# Patient Record
Sex: Female | Born: 1935 | Race: White | Hispanic: No | Smoking: Never smoker
Health system: Southern US, Community
[De-identification: ages and names within clinical notes are randomized; demographics above are authoritative.]

## PROBLEM LIST (undated history)

## (undated) DIAGNOSIS — E039 Hypothyroidism, unspecified: Secondary | ICD-10-CM

## (undated) DIAGNOSIS — F0390 Unspecified dementia without behavioral disturbance: Secondary | ICD-10-CM

## (undated) DIAGNOSIS — N2889 Other specified disorders of kidney and ureter: Secondary | ICD-10-CM

## (undated) DIAGNOSIS — E1142 Type 2 diabetes mellitus with diabetic polyneuropathy: Secondary | ICD-10-CM

## (undated) DIAGNOSIS — K5909 Other constipation: Secondary | ICD-10-CM

## (undated) DIAGNOSIS — H547 Unspecified visual loss: Secondary | ICD-10-CM

## (undated) DIAGNOSIS — S42209A Unspecified fracture of upper end of unspecified humerus, initial encounter for closed fracture: Secondary | ICD-10-CM

## (undated) DIAGNOSIS — F0150 Vascular dementia without behavioral disturbance: Secondary | ICD-10-CM

## (undated) DIAGNOSIS — E119 Type 2 diabetes mellitus without complications: Secondary | ICD-10-CM

## (undated) DIAGNOSIS — IMO0001 Reserved for inherently not codable concepts without codable children: Secondary | ICD-10-CM

## (undated) DIAGNOSIS — K219 Gastro-esophageal reflux disease without esophagitis: Secondary | ICD-10-CM

## (undated) DIAGNOSIS — I10 Essential (primary) hypertension: Secondary | ICD-10-CM

## (undated) DIAGNOSIS — E559 Vitamin D deficiency, unspecified: Secondary | ICD-10-CM

## (undated) DIAGNOSIS — M7989 Other specified soft tissue disorders: Secondary | ICD-10-CM

## (undated) DIAGNOSIS — Z794 Long term (current) use of insulin: Secondary | ICD-10-CM

## (undated) DIAGNOSIS — R0609 Other forms of dyspnea: Secondary | ICD-10-CM

## (undated) DIAGNOSIS — H9190 Unspecified hearing loss, unspecified ear: Secondary | ICD-10-CM

## (undated) DIAGNOSIS — M47816 Spondylosis without myelopathy or radiculopathy, lumbar region: Secondary | ICD-10-CM

## (undated) DIAGNOSIS — E785 Hyperlipidemia, unspecified: Secondary | ICD-10-CM

## (undated) DIAGNOSIS — N183 Chronic kidney disease, stage 3 unspecified: Secondary | ICD-10-CM

## (undated) DIAGNOSIS — E66812 Obesity, class 2: Secondary | ICD-10-CM

## (undated) DIAGNOSIS — F32A Depression, unspecified: Secondary | ICD-10-CM

## (undated) DIAGNOSIS — M1990 Unspecified osteoarthritis, unspecified site: Secondary | ICD-10-CM

## (undated) DIAGNOSIS — I8390 Asymptomatic varicose veins of unspecified lower extremity: Secondary | ICD-10-CM

## (undated) DIAGNOSIS — Z973 Presence of spectacles and contact lenses: Secondary | ICD-10-CM

## (undated) DIAGNOSIS — H3530 Unspecified macular degeneration: Secondary | ICD-10-CM

## (undated) DIAGNOSIS — G4733 Obstructive sleep apnea (adult) (pediatric): Secondary | ICD-10-CM

## (undated) DIAGNOSIS — G4730 Sleep apnea, unspecified: Secondary | ICD-10-CM

## (undated) HISTORY — PX: VARICOSE VEIN SURGERY: SHX832

## (undated) HISTORY — DX: Vitamin D deficiency, unspecified: E55.9

## (undated) HISTORY — PX: COLONOSCOPY W/ BIOPSIES AND POLYPECTOMY: SHX1376

## (undated) HISTORY — DX: Hypothyroidism, unspecified: E03.9

## (undated) HISTORY — DX: Other forms of dyspnea: R06.09

## (undated) HISTORY — DX: Long term (current) use of insulin: Z79.4

## (undated) HISTORY — DX: Reserved for inherently not codable concepts without codable children: IMO0001

## (undated) HISTORY — PX: BIOPSY THYROID: PRO38

## (undated) HISTORY — DX: Hyperlipidemia, unspecified: E78.5

## (undated) HISTORY — DX: Obstructive sleep apnea (adult) (pediatric): G47.33

## (undated) HISTORY — DX: Unspecified macular degeneration: H35.30

## (undated) HISTORY — DX: Asymptomatic varicose veins of unspecified lower extremity: I83.90

## (undated) HISTORY — DX: Other specified soft tissue disorders: M79.89

## (undated) HISTORY — DX: Essential (primary) hypertension: I10

## (undated) HISTORY — DX: Unspecified visual loss: H54.7

## (undated) HISTORY — DX: Depression, unspecified: F32.A

## (undated) HISTORY — DX: Type 2 diabetes mellitus without complications: E11.9

## (undated) HISTORY — DX: Chronic kidney disease, stage 3 unspecified: N18.30

## (undated) HISTORY — DX: Obesity, class 2: E66.812

## (undated) HISTORY — DX: Spondylosis without myelopathy or radiculopathy, lumbar region: M47.816

## (undated) HISTORY — PX: CATARACT EXTRACTION, BILATERAL: SHX1313

## (undated) HISTORY — PX: TUBAL LIGATION: SHX77

## (undated) HISTORY — DX: Type 2 diabetes mellitus with diabetic polyneuropathy: E11.42

## (undated) HISTORY — DX: Unspecified fracture of upper end of unspecified humerus, initial encounter for closed fracture: S42.209A

## (undated) HISTORY — DX: Unspecified dementia, unspecified severity, without behavioral disturbance, psychotic disturbance, mood disturbance, and anxiety: F03.90

## (undated) HISTORY — DX: Other constipation: K59.09

## (undated) HISTORY — DX: Other specified disorders of kidney and ureter: N28.89

## (undated) HISTORY — DX: Vascular dementia, unspecified severity, without behavioral disturbance, psychotic disturbance, mood disturbance, and anxiety: F01.50

---

## 1998-04-17 ENCOUNTER — Ambulatory Visit (HOSPITAL_COMMUNITY): Admission: RE | Admit: 1998-04-17 | Discharge: 1998-04-17 | Payer: Self-pay | Admitting: *Deleted

## 1998-09-09 HISTORY — PX: ENDOMETRIAL BIOPSY: SHX622

## 1998-12-08 ENCOUNTER — Ambulatory Visit (HOSPITAL_COMMUNITY): Admission: RE | Admit: 1998-12-08 | Discharge: 1998-12-08 | Payer: Self-pay | Admitting: *Deleted

## 1999-05-21 ENCOUNTER — Other Ambulatory Visit: Admission: RE | Admit: 1999-05-21 | Discharge: 1999-05-21 | Payer: Self-pay | Admitting: Obstetrics & Gynecology

## 1999-05-22 ENCOUNTER — Other Ambulatory Visit: Admission: RE | Admit: 1999-05-22 | Discharge: 1999-05-22 | Payer: Self-pay

## 1999-05-22 ENCOUNTER — Encounter (INDEPENDENT_AMBULATORY_CARE_PROVIDER_SITE_OTHER): Payer: Self-pay | Admitting: Specialist

## 2000-02-08 ENCOUNTER — Ambulatory Visit (HOSPITAL_COMMUNITY): Admission: RE | Admit: 2000-02-08 | Discharge: 2000-02-08 | Payer: Self-pay | Admitting: *Deleted

## 2000-05-06 ENCOUNTER — Ambulatory Visit (HOSPITAL_COMMUNITY): Admission: RE | Admit: 2000-05-06 | Discharge: 2000-05-06 | Payer: Self-pay | Admitting: *Deleted

## 2000-06-20 ENCOUNTER — Ambulatory Visit (HOSPITAL_COMMUNITY): Admission: RE | Admit: 2000-06-20 | Discharge: 2000-06-20 | Payer: Self-pay | Admitting: Gastroenterology

## 2000-06-20 ENCOUNTER — Encounter (INDEPENDENT_AMBULATORY_CARE_PROVIDER_SITE_OTHER): Payer: Self-pay | Admitting: Specialist

## 2000-11-24 ENCOUNTER — Ambulatory Visit (HOSPITAL_COMMUNITY): Admission: RE | Admit: 2000-11-24 | Discharge: 2000-11-24 | Payer: Self-pay | Admitting: Internal Medicine

## 2000-11-24 ENCOUNTER — Encounter: Payer: Self-pay | Admitting: Internal Medicine

## 2000-11-26 ENCOUNTER — Ambulatory Visit (HOSPITAL_COMMUNITY): Admission: RE | Admit: 2000-11-26 | Discharge: 2000-11-26 | Payer: Self-pay | Admitting: Internal Medicine

## 2001-03-06 ENCOUNTER — Encounter: Admission: RE | Admit: 2001-03-06 | Discharge: 2001-06-04 | Payer: Self-pay | Admitting: Internal Medicine

## 2001-05-27 ENCOUNTER — Other Ambulatory Visit: Admission: RE | Admit: 2001-05-27 | Discharge: 2001-05-27 | Payer: Self-pay | Admitting: Internal Medicine

## 2001-06-22 ENCOUNTER — Encounter: Admission: RE | Admit: 2001-06-22 | Discharge: 2001-09-20 | Payer: Self-pay | Admitting: Internal Medicine

## 2001-06-29 ENCOUNTER — Ambulatory Visit (HOSPITAL_COMMUNITY): Admission: RE | Admit: 2001-06-29 | Discharge: 2001-06-29 | Payer: Self-pay | Admitting: Internal Medicine

## 2001-06-29 ENCOUNTER — Encounter: Payer: Self-pay | Admitting: Internal Medicine

## 2001-08-28 ENCOUNTER — Encounter: Admission: RE | Admit: 2001-08-28 | Discharge: 2001-08-28 | Payer: Self-pay | Admitting: Internal Medicine

## 2001-08-28 ENCOUNTER — Encounter: Payer: Self-pay | Admitting: Internal Medicine

## 2001-08-28 ENCOUNTER — Encounter (INDEPENDENT_AMBULATORY_CARE_PROVIDER_SITE_OTHER): Payer: Self-pay | Admitting: Specialist

## 2001-08-31 ENCOUNTER — Encounter: Payer: Self-pay | Admitting: Internal Medicine

## 2001-08-31 ENCOUNTER — Encounter: Admission: RE | Admit: 2001-08-31 | Discharge: 2001-08-31 | Payer: Self-pay | Admitting: Internal Medicine

## 2001-10-13 ENCOUNTER — Encounter: Admission: RE | Admit: 2001-10-13 | Discharge: 2002-01-11 | Payer: Self-pay | Admitting: Internal Medicine

## 2002-03-03 ENCOUNTER — Encounter: Admission: RE | Admit: 2002-03-03 | Discharge: 2002-06-01 | Payer: Self-pay | Admitting: Internal Medicine

## 2002-04-21 ENCOUNTER — Encounter: Payer: Self-pay | Admitting: Internal Medicine

## 2002-04-21 ENCOUNTER — Ambulatory Visit (HOSPITAL_COMMUNITY): Admission: RE | Admit: 2002-04-21 | Discharge: 2002-04-21 | Payer: Self-pay | Admitting: Internal Medicine

## 2002-08-04 ENCOUNTER — Other Ambulatory Visit: Admission: RE | Admit: 2002-08-04 | Discharge: 2002-08-04 | Payer: Self-pay | Admitting: Internal Medicine

## 2002-08-10 ENCOUNTER — Encounter: Payer: Self-pay | Admitting: Internal Medicine

## 2002-08-10 ENCOUNTER — Encounter: Admission: RE | Admit: 2002-08-10 | Discharge: 2002-08-10 | Payer: Self-pay | Admitting: Internal Medicine

## 2002-11-30 ENCOUNTER — Ambulatory Visit (HOSPITAL_COMMUNITY): Admission: RE | Admit: 2002-11-30 | Discharge: 2002-11-30 | Payer: Self-pay | Admitting: Internal Medicine

## 2002-11-30 ENCOUNTER — Encounter: Payer: Self-pay | Admitting: Internal Medicine

## 2003-12-01 ENCOUNTER — Encounter: Admission: RE | Admit: 2003-12-01 | Discharge: 2003-12-01 | Payer: Self-pay | Admitting: Internal Medicine

## 2004-08-15 ENCOUNTER — Other Ambulatory Visit: Admission: RE | Admit: 2004-08-15 | Discharge: 2004-08-15 | Payer: Self-pay | Admitting: Internal Medicine

## 2005-04-04 ENCOUNTER — Encounter: Admission: RE | Admit: 2005-04-04 | Discharge: 2005-04-04 | Payer: Self-pay | Admitting: Internal Medicine

## 2006-06-10 ENCOUNTER — Encounter: Admission: RE | Admit: 2006-06-10 | Discharge: 2006-06-10 | Payer: Self-pay | Admitting: Internal Medicine

## 2006-08-25 ENCOUNTER — Other Ambulatory Visit: Admission: RE | Admit: 2006-08-25 | Discharge: 2006-08-25 | Payer: Self-pay | Admitting: Internal Medicine

## 2007-07-07 ENCOUNTER — Encounter: Admission: RE | Admit: 2007-07-07 | Discharge: 2007-07-07 | Payer: Self-pay | Admitting: Internal Medicine

## 2008-07-15 ENCOUNTER — Encounter: Admission: RE | Admit: 2008-07-15 | Discharge: 2008-07-15 | Payer: Self-pay | Admitting: Internal Medicine

## 2009-09-21 ENCOUNTER — Other Ambulatory Visit: Admission: RE | Admit: 2009-09-21 | Discharge: 2009-09-21 | Payer: Self-pay | Admitting: Internal Medicine

## 2010-12-07 ENCOUNTER — Ambulatory Visit (HOSPITAL_BASED_OUTPATIENT_CLINIC_OR_DEPARTMENT_OTHER): Payer: Medicare Other | Attending: Internal Medicine

## 2010-12-07 DIAGNOSIS — G4733 Obstructive sleep apnea (adult) (pediatric): Secondary | ICD-10-CM | POA: Insufficient documentation

## 2010-12-07 DIAGNOSIS — G4761 Periodic limb movement disorder: Secondary | ICD-10-CM | POA: Insufficient documentation

## 2010-12-08 DIAGNOSIS — G4710 Hypersomnia, unspecified: Secondary | ICD-10-CM

## 2010-12-08 DIAGNOSIS — G4730 Sleep apnea, unspecified: Secondary | ICD-10-CM

## 2011-01-25 NOTE — Op Note (Signed)
[facility]  Patient:    Catherine Wood, Catherine Wood                          MRN: [id number] Proc. Date: 06/20/00 Adm. Date:  [id number] Attending:  Nelda Marseille CC:         Nicky Pugh. Lenon Ahmadi, M.D.   Operative Report  PROCEDURE:  Colonoscopy with biopsy.  INDICATIONS:  History of colon polyps, due for repeat screening.  Consent was signed after risks, benefits, methods and options thoroughly discussed multiple times in the past.  MEDICATIONS USED:  Demerol 80, Versed 8.  DESCRIPTION OF PROCEDURE:  Rectal inspection was performed for external hemorrhoids.  Digital exam was negative.  Video pediatric colonoscope was inserted and fairly easily advanced around the colon to the cecum.  This did require left lower quadrant pressure but no position changes.  The cecum was identified by the appendiceal orifice and the ileocecal valve.  There was some stool adherent to the cecum and in the ascending which required lots of washing and suctioning.  The rest of the prep was adequate.  On insertion, no obvious abnormality was seen.  The scope was then slowly withdrawn.  The cecum, ascending, and transverse was normal.  In the mid descending, possibly a tiny 1- to 2-mm polyp was seen which was cold biopsied x 2.  The scope was further withdrawn.  No other polypoid lesions, masses, or other abnormalities were seen as we slowly withdrew back to the rectum.  Once back in the rectum, the scope was retroflexed, ______ for some internal hemorrhoids.  The scope was straightened.  Anorectal pullthrough confirmed the hemorrhoids.  The scope was reinserted a short way up the sigmoid area with suction scope removed. The patient tolerated the procedure well.  There were no obvious immediate complications.  ENDOSCOPIC DIAGNOSES: 1. Internal/external hemorrhoids. 2. Questionable tiny descending polyps, cold biopsied. 3. Otherwise within normal limits to the cecum.  PLAN:  Yearly  rectals and guaiacs per Dr. Lenon Ahmadi.  Happy to see back p.r.n. Await pathology.  Will probably recheck colon screening in five years. DD:  06/20/00 TD:  06/22/00 Job: [id number]

## 2011-12-27 ENCOUNTER — Other Ambulatory Visit: Payer: Self-pay | Admitting: Internal Medicine

## 2011-12-27 DIAGNOSIS — E0590 Thyrotoxicosis, unspecified without thyrotoxic crisis or storm: Secondary | ICD-10-CM

## 2011-12-27 DIAGNOSIS — Z1231 Encounter for screening mammogram for malignant neoplasm of breast: Secondary | ICD-10-CM

## 2012-01-23 ENCOUNTER — Ambulatory Visit
Admission: RE | Admit: 2012-01-23 | Discharge: 2012-01-23 | Disposition: A | Discharge status: Home / Self Care | Payer: Medicare Other | Source: Ambulatory Visit | Attending: Internal Medicine | Admitting: Internal Medicine

## 2012-01-23 DIAGNOSIS — Z1231 Encounter for screening mammogram for malignant neoplasm of breast: Secondary | ICD-10-CM

## 2012-01-23 DIAGNOSIS — E0590 Thyrotoxicosis, unspecified without thyrotoxic crisis or storm: Secondary | ICD-10-CM

## 2013-01-22 ENCOUNTER — Other Ambulatory Visit: Payer: Self-pay

## 2013-01-22 DIAGNOSIS — Z1231 Encounter for screening mammogram for malignant neoplasm of breast: Secondary | ICD-10-CM

## 2013-02-12 ENCOUNTER — Ambulatory Visit
Admission: RE | Admit: 2013-02-12 | Discharge: 2013-02-12 | Disposition: A | Discharge status: Home / Self Care | Payer: Medicare Other | Source: Ambulatory Visit

## 2013-02-12 DIAGNOSIS — Z1231 Encounter for screening mammogram for malignant neoplasm of breast: Secondary | ICD-10-CM

## 2013-08-03 ENCOUNTER — Other Ambulatory Visit: Payer: Self-pay | Admitting: Emergency Medicine

## 2013-08-03 MED ORDER — METFORMIN HCL 1000 MG PO TABS: 1000.0000 mg | ORAL_TABLET | Freq: Two times a day (BID) | ORAL | Status: DC

## 2013-08-03 MED ORDER — INSULIN LISPRO PROT & LISPRO (75-25 MIX) 100 UNIT/ML ~~LOC~~ SUSP: 25.0000 [IU] | Freq: Two times a day (BID) | SUBCUTANEOUS | Status: DC

## 2013-09-22 ENCOUNTER — Other Ambulatory Visit: Payer: Self-pay | Admitting: Physician Assistant

## 2013-09-22 MED ORDER — FUROSEMIDE 40 MG PO TABS: 40.0000 mg | ORAL_TABLET | Freq: Every day | ORAL | Status: DC

## 2013-09-23 ENCOUNTER — Other Ambulatory Visit: Payer: Self-pay | Admitting: Physician Assistant

## 2013-09-23 ENCOUNTER — Other Ambulatory Visit: Payer: Self-pay | Admitting: Emergency Medicine

## 2013-09-26 ENCOUNTER — Encounter: Payer: Self-pay | Admitting: *Deleted

## 2013-09-26 DIAGNOSIS — H3530 Unspecified macular degeneration: Secondary | ICD-10-CM | POA: Insufficient documentation

## 2013-09-26 DIAGNOSIS — N183 Chronic kidney disease, stage 3 unspecified: Secondary | ICD-10-CM | POA: Insufficient documentation

## 2013-09-26 DIAGNOSIS — E039 Hypothyroidism, unspecified: Secondary | ICD-10-CM | POA: Insufficient documentation

## 2013-09-26 DIAGNOSIS — E1169 Type 2 diabetes mellitus with other specified complication: Secondary | ICD-10-CM | POA: Insufficient documentation

## 2013-09-26 DIAGNOSIS — E785 Hyperlipidemia, unspecified: Secondary | ICD-10-CM

## 2013-09-26 DIAGNOSIS — I10 Essential (primary) hypertension: Secondary | ICD-10-CM | POA: Insufficient documentation

## 2013-09-26 DIAGNOSIS — E1122 Type 2 diabetes mellitus with diabetic chronic kidney disease: Secondary | ICD-10-CM | POA: Insufficient documentation

## 2013-09-28 ENCOUNTER — Ambulatory Visit (INDEPENDENT_AMBULATORY_CARE_PROVIDER_SITE_OTHER): Payer: Medicare Other | Admitting: Emergency Medicine

## 2013-09-28 ENCOUNTER — Encounter: Payer: Self-pay | Admitting: Emergency Medicine

## 2013-09-28 VITALS — BP 142/64 | HR 80 | Temp 98.6°F | Resp 16 | Ht 64.0 in | Wt 205.0 lb

## 2013-09-28 DIAGNOSIS — E669 Obesity, unspecified: Secondary | ICD-10-CM

## 2013-09-28 DIAGNOSIS — I10 Essential (primary) hypertension: Secondary | ICD-10-CM

## 2013-09-28 DIAGNOSIS — R300 Dysuria: Secondary | ICD-10-CM

## 2013-09-28 DIAGNOSIS — E109 Type 1 diabetes mellitus without complications: Secondary | ICD-10-CM

## 2013-09-28 DIAGNOSIS — E782 Mixed hyperlipidemia: Secondary | ICD-10-CM

## 2013-09-28 DIAGNOSIS — E559 Vitamin D deficiency, unspecified: Secondary | ICD-10-CM

## 2013-09-28 LAB — CBC WITH DIFFERENTIAL/PLATELET
BASOS ABS: 0.1 10*3/uL (ref 0.0–0.1)
Basophils Relative: 1 % (ref 0–1)
Eosinophils Absolute: 0.4 10*3/uL (ref 0.0–0.7)
Eosinophils Relative: 5 % (ref 0–5)
HEMATOCRIT: 39.4 % (ref 36.0–46.0)
HEMOGLOBIN: 13 g/dL (ref 12.0–15.0)
LYMPHS ABS: 1.9 10*3/uL (ref 0.7–4.0)
Lymphocytes Relative: 25 % (ref 12–46)
MCH: 29.1 pg (ref 26.0–34.0)
MCHC: 33 g/dL (ref 30.0–36.0)
MCV: 88.1 fL (ref 78.0–100.0)
MONOS PCT: 12 % (ref 3–12)
Monocytes Absolute: 0.9 10*3/uL (ref 0.1–1.0)
NEUTROS PCT: 57 % (ref 43–77)
Neutro Abs: 4.4 10*3/uL (ref 1.7–7.7)
Platelets: 366 10*3/uL (ref 150–400)
RBC: 4.47 MIL/uL (ref 3.87–5.11)
RDW: 13.7 % (ref 11.5–15.5)
WBC: 7.7 10*3/uL (ref 4.0–10.5)

## 2013-09-28 LAB — HEMOGLOBIN A1C
HEMOGLOBIN A1C: 8.9 % — AB (ref <5.7)
Mean Plasma Glucose: 209 mg/dL — ABNORMAL HIGH (ref <117)

## 2013-09-28 MED ORDER — CIPROFLOXACIN HCL 250 MG PO TABS: 250.0000 mg | ORAL_TABLET | Freq: Two times a day (BID) | ORAL | Status: AC

## 2013-09-28 NOTE — Patient Instructions (Signed)
Urinary Tract Infection °A urinary tract infection (UTI) can occur any place along the urinary tract. The tract includes the kidneys, ureters, bladder, and urethra. A type of germ called bacteria often causes a UTI. UTIs are often helped with antibiotic medicine.  °HOME CARE  °· If given, take antibiotics as told by your doctor. Finish them even if you start to feel better. °· Drink enough fluids to keep your pee (urine) clear or pale yellow. °· Avoid tea, drinks with caffeine, and bubbly (carbonated) drinks. °· Pee often. Avoid holding your pee in for a long time. °· Pee before and after having sex (intercourse). °· Wipe from front to back after you poop (bowel movement) if you are a woman. Use each tissue only once. °GET HELP RIGHT AWAY IF:  °· You have back pain. °· You have lower belly (abdominal) pain. °· You have chills. °· You feel sick to your stomach (nauseous). °· You throw up (vomit). °· Your burning or discomfort with peeing does not go away. °· You have a fever. °· Your symptoms are not better in 3 days. °MAKE SURE YOU:  °· Understand these instructions. °· Will watch your condition. °· Will get help right away if you are not doing well or get worse. °Document Released: 02/12/2008 Document Revised: 05/20/2012 Document Reviewed: 03/26/2012 °[employer]® Patient Information ©2014 [employer]. ° °

## 2013-09-28 NOTE — Progress Notes (Signed)
Subjective:    Patient ID: Catherine Wood, female    DOB: [birth year], 80 y.o.   MRN: [id number]  HPI Comments: 80 yo obese female presents for 3 month F/U for HTN, Cholesterol, DM, D. deficient LAST LABS BS 215 T 227 TG 389 L 105 A1C 8.4 D 38 MAG 1.5 She increased Pravastatin to a whole pill at last OV with out SE. She has increased vitamin D to 15000 AD. She is not eating healthy but has decreased portions. She is not exercising. She notes BS was good until Christmas then 200s.   She has been using AZo OTC without relief for decreased urine output, dysuria, frequency x 2 weeks. She denies fever or hematuria.  Hypertension  Hyperlipidemia  Diabetes   Current Outpatient Prescriptions on File Prior to Visit  Medication Sig Dispense Refill  . Cholecalciferol (VITAMIN D3) 5000 UNITS CAPS Take 5,000 Units by mouth 3 (three) times daily.       . Cinnamon 500 MG capsule Take 500 mg by mouth daily.      . fenofibrate 160 MG tablet Take 160 mg by mouth daily.      . ferrous fumarate (HEMOCYTE - 106 MG FE) 325 (106 FE) MG TABS tablet Take 1 tablet by mouth daily.      . furosemide (LASIX) 40 MG tablet Take 1 tablet (40 mg total) by mouth daily.  90 tablet  0  . Insulin Pen Needle (PEN NEEDLES 29GX1/2") 29G X 12MM MISC USE 4 TIMES A DAY  100 each  6  . levothyroxine (SYNTHROID, LEVOTHROID) 100 MCG tablet Take 100 mcg by mouth daily before breakfast.      . losartan (COZAAR) 100 MG tablet Take 100 mg by mouth daily. 1/2 daily = 50 mg      . Magnesium 400 MG CAPS Take by mouth 4 (four) times daily.      . metFORMIN (GLUCOPHAGE) 1000 MG tablet Take 1 tablet (1,000 mg total) by mouth 2 (two) times daily with a meal.  60 tablet  2  . Multiple Vitamins-Minerals (ICAPS PO) Take by mouth daily.      . Omega-3 Fatty Acids (FISH OIL) 1000 MG CAPS Take by mouth daily.      . pravastatin (PRAVACHOL) 40 MG tablet Take 40 mg by mouth daily.        No current facility-administered medications on file prior to  visit.   ALLERGIES Detrol  Past Medical History  Diagnosis Date  . Hypertension   . Hypothyroid   . IDDM (insulin dependent diabetes mellitus)   . Hyperlipidemia   . Macular degeneration        Review of Systems  Genitourinary: Positive for dysuria, urgency, frequency and difficulty urinating.  All other systems reviewed and are negative.   BP 142/64  Pulse 80  Temp(Src) 98.6 F (37 C) (Temporal)  Resp 16  Ht 5\' 4"  (1.626 m)  Wt 205 lb (92.987 kg)  BMI 35.17 kg/m2     Objective:   Physical Exam  Nursing note and vitals reviewed. Constitutional: She is oriented to person, place, and time. She appears well-developed and well-nourished. No distress.  Obese centrally  HENT:  Head: Normocephalic and atraumatic.  Right Ear: External ear normal.  Left Ear: External ear normal.  Nose: Nose normal.  Mouth/Throat: Oropharynx is clear and moist.  Eyes: Conjunctivae and EOM are normal.  Neck: Normal range of motion. Neck supple. No JVD present. No thyromegaly present.  Cardiovascular: Normal rate, regular  rhythm, normal heart sounds and intact distal pulses.   Pulmonary/Chest: Effort normal and breath sounds normal.  Abdominal: Soft. Bowel sounds are normal. She exhibits no distension and no mass. There is tenderness. There is no rebound and no guarding.  suprapubic  Musculoskeletal: Normal range of motion. She exhibits no edema and no tenderness.  Lymphadenopathy:    She has no cervical adenopathy.  Neurological: She is alert and oriented to person, place, and time. No cranial nerve deficit.  Skin: Skin is warm and dry. No rash noted. No erythema. No pallor.  Psychiatric: She has a normal mood and affect. Her behavior is normal. Judgment and thought content normal.          Assessment & Plan:  1.  3 month F/U for Obesity, HTN, Cholesterol,DM, D. Deficient. Needs healthy diet, cardio QD and obtain healthy weight. Check Labs, Check BP if >130/80 call office, Check BS  if >200 call office 2. Dysuria- Check labs, increase H2o, Hygiene discussed. Cipro 250 mg BID AD

## 2013-09-29 LAB — HEPATIC FUNCTION PANEL
ALBUMIN: 4.2 g/dL (ref 3.5–5.2)
ALT: 22 U/L (ref 0–35)
AST: 22 U/L (ref 0–37)
Alkaline Phosphatase: 46 U/L (ref 39–117)
BILIRUBIN TOTAL: 0.3 mg/dL (ref 0.3–1.2)
Bilirubin, Direct: 0.1 mg/dL (ref 0.0–0.3)
Indirect Bilirubin: 0.2 mg/dL (ref 0.0–0.9)
Total Protein: 6.7 g/dL (ref 6.0–8.3)

## 2013-09-29 LAB — URINALYSIS, ROUTINE W REFLEX MICROSCOPIC
BILIRUBIN URINE: NEGATIVE
GLUCOSE, UA: 100 mg/dL — AB
Ketones, ur: NEGATIVE mg/dL
NITRITE: NEGATIVE
PH: 6 (ref 5.0–8.0)
Protein, ur: 30 mg/dL — AB
SPECIFIC GRAVITY, URINE: 1.016 (ref 1.005–1.030)
Urobilinogen, UA: 0.2 mg/dL (ref 0.0–1.0)

## 2013-09-29 LAB — BASIC METABOLIC PANEL WITH GFR
BUN: 21 mg/dL (ref 6–23)
CALCIUM: 9.8 mg/dL (ref 8.4–10.5)
CHLORIDE: 97 meq/L (ref 96–112)
CO2: 32 mEq/L (ref 19–32)
Creat: 0.97 mg/dL (ref 0.50–1.10)
GFR, EST AFRICAN AMERICAN: 63 mL/min
GFR, Est Non African American: 55 mL/min — ABNORMAL LOW
GLUCOSE: 200 mg/dL — AB (ref 70–99)
POTASSIUM: 4.6 meq/L (ref 3.5–5.3)
SODIUM: 137 meq/L (ref 135–145)

## 2013-09-29 LAB — URINALYSIS, MICROSCOPIC ONLY
CASTS: NONE SEEN
CRYSTALS: NONE SEEN
RBC / HPF: NONE SEEN RBC/hpf (ref <3)
Squamous Epithelial / LPF: NONE SEEN

## 2013-09-29 LAB — LIPID PANEL
CHOL/HDL RATIO: 4.2 ratio
CHOLESTEROL: 183 mg/dL (ref 0–200)
HDL: 44 mg/dL (ref >39)
LDL Cholesterol: 77 mg/dL (ref 0–99)
TRIGLYCERIDES: 312 mg/dL — AB (ref <150)
VLDL: 62 mg/dL — ABNORMAL HIGH (ref 0–40)

## 2013-09-29 LAB — MAGNESIUM: MAGNESIUM: 1.5 mg/dL (ref 1.5–2.5)

## 2013-09-29 LAB — INSULIN, FASTING: INSULIN FASTING, SERUM: 142 u[IU]/mL — AB (ref 3–28)

## 2013-09-29 LAB — VITAMIN D 25 HYDROXY (VIT D DEFICIENCY, FRACTURES): Vit D, 25-Hydroxy: 64 ng/mL (ref 30–89)

## 2013-09-30 LAB — URINE CULTURE

## 2013-11-04 ENCOUNTER — Ambulatory Visit: Payer: Self-pay | Admitting: Emergency Medicine

## 2013-11-10 ENCOUNTER — Other Ambulatory Visit: Payer: Self-pay | Admitting: Internal Medicine

## 2013-11-10 ENCOUNTER — Other Ambulatory Visit: Payer: Self-pay | Admitting: Emergency Medicine

## 2013-11-10 MED ORDER — METFORMIN HCL 1000 MG PO TABS: 1000.0000 mg | ORAL_TABLET | Freq: Two times a day (BID) | ORAL | Status: DC

## 2013-11-16 ENCOUNTER — Ambulatory Visit (INDEPENDENT_AMBULATORY_CARE_PROVIDER_SITE_OTHER): Payer: Medicare Other | Admitting: Emergency Medicine

## 2013-11-16 ENCOUNTER — Encounter: Payer: Self-pay | Admitting: Emergency Medicine

## 2013-11-16 VITALS — BP 122/64 | HR 86 | Temp 98.4°F | Resp 18 | Ht 64.0 in | Wt 208.0 lb

## 2013-11-16 DIAGNOSIS — Z794 Long term (current) use of insulin: Secondary | ICD-10-CM

## 2013-11-16 DIAGNOSIS — E1159 Type 2 diabetes mellitus with other circulatory complications: Secondary | ICD-10-CM

## 2013-11-16 DIAGNOSIS — E119 Type 2 diabetes mellitus without complications: Secondary | ICD-10-CM

## 2013-11-16 DIAGNOSIS — M79606 Pain in leg, unspecified: Secondary | ICD-10-CM

## 2013-11-16 DIAGNOSIS — R0602 Shortness of breath: Secondary | ICD-10-CM

## 2013-11-16 DIAGNOSIS — IMO0001 Reserved for inherently not codable concepts without codable children: Secondary | ICD-10-CM

## 2013-11-16 DIAGNOSIS — N390 Urinary tract infection, site not specified: Secondary | ICD-10-CM

## 2013-11-16 LAB — CBC WITH DIFFERENTIAL/PLATELET
BASOS ABS: 0 10*3/uL (ref 0.0–0.1)
BASOS PCT: 0 % (ref 0–1)
EOS ABS: 0.5 10*3/uL (ref 0.0–0.7)
Eosinophils Relative: 5 % (ref 0–5)
HEMATOCRIT: 38.6 % (ref 36.0–46.0)
HEMOGLOBIN: 12.9 g/dL (ref 12.0–15.0)
Lymphocytes Relative: 28 % (ref 12–46)
Lymphs Abs: 2.5 10*3/uL (ref 0.7–4.0)
MCH: 28.2 pg (ref 26.0–34.0)
MCHC: 33.4 g/dL (ref 30.0–36.0)
MCV: 84.5 fL (ref 78.0–100.0)
MONO ABS: 0.8 10*3/uL (ref 0.1–1.0)
Monocytes Relative: 9 % (ref 3–12)
NEUTROS ABS: 5.3 10*3/uL (ref 1.7–7.7)
NEUTROS PCT: 58 % (ref 43–77)
Platelets: 353 10*3/uL (ref 150–400)
RBC: 4.57 MIL/uL (ref 3.87–5.11)
RDW: 13.4 % (ref 11.5–15.5)
WBC: 9.1 10*3/uL (ref 4.0–10.5)

## 2013-11-16 LAB — BASIC METABOLIC PANEL WITH GFR
BUN: 19 mg/dL (ref 6–23)
CO2: 34 meq/L — AB (ref 19–32)
Calcium: 9.5 mg/dL (ref 8.4–10.5)
Chloride: 99 mEq/L (ref 96–112)
Creat: 0.86 mg/dL (ref 0.50–1.10)
GFR, EST NON AFRICAN AMERICAN: 64 mL/min
GFR, Est African American: 73 mL/min
Glucose, Bld: 123 mg/dL — ABNORMAL HIGH (ref 70–99)
Potassium: 4.7 mEq/L (ref 3.5–5.3)
SODIUM: 140 meq/L (ref 135–145)

## 2013-11-16 NOTE — Patient Instructions (Signed)
Diabetes and Foot Care Diabetes may cause you to have problems because of poor blood supply (circulation) to your feet and legs. This may cause the skin on your feet to become thinner, break easier, and heal more slowly. Your skin may become dry, and the skin may peel and crack. You may also have nerve damage in your legs and feet causing decreased feeling in them. You may not notice minor injuries to your feet that could lead to infections or more serious problems. Taking care of your feet is one of the most important things you can do for yourself.  HOME CARE INSTRUCTIONS Wear shoes at all times, even in the house. Do not go barefoot. Bare feet are easily injured. Check your feet daily for blisters, cuts, and redness. If you cannot see the bottom of your feet, use a mirror or ask someone for help. Wash your feet with warm water (do not use hot water) and mild soap. Then pat your feet and the areas between your toes until they are completely dry. Do not soak your feet as this can dry your skin. Apply a moisturizing lotion or petroleum jelly (that does not contain alcohol and is unscented) to the skin on your feet and to dry, brittle toenails. Do not apply lotion between your toes. Trim your toenails straight across. Do not dig under them or around the cuticle. File the edges of your nails with an emery board or nail file. Do not cut corns or calluses or try to remove them with medicine. Wear clean socks or stockings every day. Make sure they are not too tight. Do not wear knee-high stockings since they may decrease blood flow to your legs. Wear shoes that fit properly and have enough cushioning. To break in new shoes, wear them for just a few hours a day. This prevents you from injuring your feet. Always look in your shoes before you put them on to be sure there are no objects inside. Do not cross your legs. This may decrease the blood flow to your feet. If you find a minor scrape, cut, or break in the  skin on your feet, keep it and the skin around it clean and dry. These areas may be cleansed with mild soap and water. Do not cleanse the area with peroxide, alcohol, or iodine. When you remove an adhesive bandage, be sure not to damage the skin around it. If you have a wound, look at it several times a day to make sure it is healing. Do not use heating pads or hot water bottles. They may burn your skin. If you have lost feeling in your feet or legs, you may not know it is happening until it is too late. Make sure your health care provider performs a complete foot exam at least annually or more often if you have foot problems. Report any cuts, sores, or bruises to your health care provider immediately. SEEK MEDICAL CARE IF:  You have an injury that is not healing. You have cuts or breaks in the skin. You have an ingrown nail. You notice redness on your legs or feet. You feel burning or tingling in your legs or feet. You have pain or cramps in your legs and feet. Your legs or feet are numb. Your feet always feel cold. SEEK IMMEDIATE MEDICAL CARE IF:  There is increasing redness, swelling, or pain in or around a wound. There is a red line that goes up your leg. Pus is coming from a  wound. You develop a fever or as directed by your health care provider. You notice a bad smell coming from an ulcer or wound. Document Released: 08/23/2000 Document Revised: 04/28/2013 Document Reviewed: 02/02/2013 [facility] Patient Information 2014 [place], [place]. Heart Failure Heart failure means your heart has trouble pumping blood. This makes it hard for your body to work well. Heart failure is usually a long-term (chronic) condition. You must take good care of yourself and follow your doctor's treatment plan. HOME CARE  Take your heart medicine as told by your doctor.  Do not stop taking medicine unless your doctor tells you to.  Do not skip any dose of medicine.  Refill your medicines before they run  out.  Take other medicines only as told by your doctor or pharmacist.  Stay active if told by your doctor. The elderly and people with severe heart failure should talk with a doctor about physical activity.  Eat heart healthy foods. Choose foods that are without trans fat and are low in saturated fat, cholesterol, and salt (sodium). This includes fresh or frozen fruits and vegetables, fish, lean meats, fat-free or low-fat dairy foods, whole grains, and high-fiber foods. Lentils and dried peas and beans (legumes) are also good choices.  Limit salt if told by your doctor.  Cook in a healthy way. Roast, grill, broil, bake, poach, steam, or stir-fry foods.  Limit fluids as told by your doctor.  Weigh yourself every morning. Do this after you pee (urinate) and before you eat breakfast. Write down your weight to give to your doctor.  Take your blood pressure and write it down if your doctor tell you to.  Ask your doctor how to check your pulse. Check your pulse as told.  Lose weight if told by your doctor.  Stop smoking or chewing tobacco. Do not use gum or patches that help you quit without your doctor's approval.  Schedule and go to doctor visits as told.  Nonpregnant women should have no more than 1 drink a day. Men should have no more than 2 drinks a day. Talk to your doctor about drinking alcohol.  Stop illegal drug use.  Stay current with shots (immunizations).  Manage your health conditions as told by your doctor.  Learn to manage your stress.  Rest when you are tired.  If it is really hot outside:  Avoid intense activities.  Use air conditioning or fans, or get in a cooler place.  Avoid caffeine and alcohol.  Wear loose-fitting, lightweight, and light-colored clothing.  If it is really cold outside:  Avoid intense activities.  Layer your clothing.  Wear mittens or gloves, a hat, and a scarf when going outside.  Avoid alcohol.  Learn about heart failure and  get support as needed.  Get help to maintain or improve your quality of life and your ability to care for yourself as needed. GET HELP IF:   You gain 03 lb/1.4 kg or more in 1 day or 05 lb/2.3 kg in a week.  You are more short of breath than usual.  You cannot do your normal activities.  You tire easily.  You cough more than normal, especially with activity.  You have any or more puffiness (swelling) in areas such as your hands, feet, ankles, or belly (abdomen).  You cannot sleep because it is hard to breathe.  You feel like your heart is beating fast (palpitations).  You get dizzy or lightheaded when you stand up. GET HELP RIGHT AWAY IF:  You have trouble breathing.  There is a change in mental status, such as becoming less alert or not being able to focus.  You have chest pain or discomfort.  You faint. MAKE SURE YOU:   Understand these instructions.  Will watch your condition.  Will get help right away if you are not doing well or get worse. Document Released: 06/04/2008 Document Revised: 12/21/2012 Document Reviewed: 03/26/2012 [facility] Patient Information 2014 [place], [place]. We want weight loss that will last so you should lose 1-2 pounds a week.  THAT IS IT! Please pick THREE things a month to change. Once it is a habit check off the item. Then pick another three items off the list to become habits.  If you are already doing a habit on the list GREAT!  Cross that item off! o Don't drink your calories. Ie, alcohol, soda, fruit juice, and sweet tea.  o Drink more water. Drink a glass when you feel hungry or before each meal.  o Eat breakfast - Complex carb and protein (likeDannon light and fit yogurt, oatmeal, fruit, eggs, [place] bacon). o Measure your cereal.  Eat no more than one cup a day. (ie [place]) o Eat an apple a day. o Add a vegetable a day. o Try a new vegetable a month. o Use Pam! Stop using oil or butter to cook. o Don't finish your plate or use  smaller plates. o Share your dessert. o Eat sugar free Jello for dessert or frozen grapes. o Don't eat 2-3 hours before bed. o Switch to whole wheat bread, pasta, and brown rice. o Make healthier choices when you eat out. No fries! o Pick baked chicken, NOT fried. o Don't forget to SLOW DOWN when you eat. It is not going anywhere.  o Take the stairs. o Park far away in the parking lot o [employer] (or weights) for 10 minutes while watching TV. o Walk at work for 10 minutes during break. o Walk outside 1 time a week with your friend, kids, dog, or significant other. o Start a walking group at [place] the mall as much as you can tolerate.  o Keep a food diary. o Weigh yourself daily. o Walk for 15 minutes 3 days per week. o Cook at home more often and eat out less.  If life happens and you go back to old habits, it is okay.  Just start over. You can do it!   If you experience chest pain, get short of breath, or tired during the exercise, please stop immediately and inform your doctor.    Bad carbs also include fruit juice, alcohol, and sweet tea. These are empty calories that do not signal to your brain that you are full.   Please remember the good carbs are still carbs which convert into sugar. So please measure them out no more than 1/2-1 cup of rice, oatmeal, pasta, and beans.  Veggies are however free foods! Pile them on.   I like lean protein at every meal such as chicken, [place], pork chops, cottage cheese, etc. Just do not fry these meats and please center your meal around vegetable, the meats should be a side dish.   No all fruit is created equal. Please see the list below, the fruit at the bottom is higher in sugars than the fruit at the top

## 2013-11-16 NOTE — Progress Notes (Addendum)
Subjective:    Patient ID: Catherine Wood, female    DOB: [birth year], 80 y.o.   MRN: [id number]  HPI Comments: 80 yo uncontrolled DM presents for f/u labs from recent UTI and new start Invokana. She was treated with Cipro at last OV for uti and notes frequency has improved.   She has started Invokana 300 which has helped with BS despite no dietary changes. She notes BS was 96 this a.m. She is feeling well overall, she has not started to decrease insulin. She checks feet routinely and denies skin break down or neuropathy increase. She notes occasionally numbness in feet.   She is having right ear pain on/ off x a week or 2. She denies any recent colds/ allergies/ TMJ Hx.   She has noted mild edema of both legs and mild SOB on/ off x several months. She denies any trigger and has improved symptoms with leg elevation. She notes difficult to exercise with increase pain in legs with walking.    Current Outpatient Prescriptions on File Prior to Visit  Medication Sig Dispense Refill  . aspirin 81 MG tablet Take 81 mg by mouth daily.      . Calcium Carbonate (CALCIUM 600 PO) Take 600 mg by mouth daily.      . Cholecalciferol (VITAMIN D3) 5000 UNITS CAPS Take 5,000 Units by mouth 3 (three) times daily.       . Cinnamon 500 MG capsule Take 500 mg by mouth daily.      . fenofibrate 160 MG tablet Take 160 mg by mouth daily.      . ferrous fumarate (HEMOCYTE - 106 MG FE) 325 (106 FE) MG TABS tablet Take 1 tablet by mouth daily.      . furosemide (LASIX) 40 MG tablet Take 1 tablet (40 mg total) by mouth daily.  90 tablet  0  . gabapentin (NEURONTIN) 300 MG capsule Take 300 mg by mouth at bedtime.      . insulin glargine (LANTUS) 100 UNIT/ML injection Inject into the skin 2 (two) times daily. 40 units BID      . insulin lispro protamine-lispro (HUMALOG 75/25 MIX) (75-25) 100 UNIT/ML SUSP injection Inject into the skin 2 (two) times daily with a meal. 35 units in the morning and 15 units in the evening       . Insulin Pen Needle (PEN NEEDLES 29GX1/2") 29G X 12MM MISC USE 4 TIMES A DAY  100 each  6  . levothyroxine (SYNTHROID, LEVOTHROID) 100 MCG tablet Take 100 mcg by mouth daily before breakfast.      . losartan (COZAAR) 100 MG tablet Take 100 mg by mouth daily. 1/2 daily = 50 mg      . Magnesium 400 MG CAPS Take by mouth 4 (four) times daily.      . metFORMIN (GLUCOPHAGE) 1000 MG tablet Take 1 tablet (1,000 mg total) by mouth 2 (two) times daily with a meal.  60 tablet  2  . Multiple Vitamins-Minerals (ICAPS PO) Take by mouth daily.      . Omega-3 Fatty Acids (FISH OIL) 1000 MG CAPS Take by mouth daily.      Marland Kitchen OVER THE COUNTER MEDICATION as needed. Macular Degeneration refresh eye drops      . pravastatin (PRAVACHOL) 40 MG tablet Take 40 mg by mouth daily.       . ranitidine (ZANTAC) 300 MG capsule Take 300 mg by mouth every evening.       No current facility-administered  medications on file prior to visit.   Allergies  Allergen Reactions  . Detrol [Tolterodine]    Past Medical History  Diagnosis Date  . Hypertension   . Hypothyroid   . IDDM (insulin dependent diabetes mellitus)   . Hyperlipidemia   . Macular degeneration      Review of Systems  HENT: Positive for ear pain.   Respiratory: Positive for shortness of breath.   Cardiovascular: Positive for leg swelling.  All other systems reviewed and are negative.   BP 122/64  Pulse 86  Temp(Src) 98.4 F (36.9 C) (Temporal)  Resp 18  Ht 5\' 4"  (1.626 m)  Wt 208 lb (94.348 kg)  BMI 35.69 kg/m2     Objective:   Physical Exam  Nursing note and vitals reviewed. Constitutional: She is oriented to person, place, and time. She appears well-developed and well-nourished. No distress.  HENT:  Head: Normocephalic and atraumatic.  Right Ear: External ear normal.  Left Ear: External ear normal.  Nose: Nose normal.  Mouth/Throat: Oropharynx is clear and moist. No oropharyngeal exudate.  Cloudy TM's bilaterally + clicking with TMJ  ROM  Eyes: Conjunctivae and EOM are normal.  Neck: Normal range of motion. Neck supple. No JVD present. No thyromegaly present.  Cardiovascular: Normal rate, regular rhythm, normal heart sounds and intact distal pulses.   2 + pitting edema bil LE EDEMA   Pulmonary/Chest: Effort normal and breath sounds normal.  Abdominal: Soft. Bowel sounds are normal. She exhibits no distension and no mass. There is no tenderness. There is no rebound and no guarding.  Musculoskeletal: Normal range of motion. She exhibits no edema and no tenderness.  Lymphadenopathy:    She has no cervical adenopathy.  Neurological: She is alert and oriented to person, place, and time. No cranial nerve deficit.  Skin: Skin is warm and dry. No rash noted. No erythema. No pallor.  Mild dryness and callus at base of great toes/ heels  Psychiatric: She has a normal mood and affect. Her behavior is normal. Judgment and thought content normal.    DM FOOT EXAM PERFORMED/ Documented      Assessment & Plan:  1. Uncontrolled DM with new start Invokana- SX of 300 mg given #42, recheck labs, continue to improve diet/ weight loss, call with BS list in 2 weeks with advised slow decrease of insulin by 2 units every 2-3 days  2.  ? UTI recent- Recheck labs, push H2O Hygiene explained  3.SOB/ Edema/ ? claudication- check labs, increase activity/ elevate legs, increase h2o, ref vascular  4. Allergic rhinitis- Allegra OTC, increase H2o, allergy hygiene explained vs TMJ, hygiene explained, w/c with results

## 2013-11-17 ENCOUNTER — Other Ambulatory Visit: Payer: Self-pay | Admitting: Internal Medicine

## 2013-11-17 LAB — BRAIN NATRIURETIC PEPTIDE: Brain Natriuretic Peptide: 21.9 pg/mL (ref 0.0–100.0)

## 2013-11-17 LAB — URINALYSIS, ROUTINE W REFLEX MICROSCOPIC
Bilirubin Urine: NEGATIVE
Glucose, UA: 100 mg/dL — AB
Hgb urine dipstick: NEGATIVE
KETONES UR: NEGATIVE mg/dL
Nitrite: POSITIVE — AB
Protein, ur: NEGATIVE mg/dL
Specific Gravity, Urine: 1.019 (ref 1.005–1.030)
UROBILINOGEN UA: 0.2 mg/dL (ref 0.0–1.0)
pH: 7 (ref 5.0–8.0)

## 2013-11-17 LAB — URINALYSIS, MICROSCOPIC ONLY
Casts: NONE SEEN
Crystals: NONE SEEN

## 2013-11-18 LAB — URINE CULTURE

## 2013-11-19 ENCOUNTER — Other Ambulatory Visit: Payer: Self-pay | Admitting: Emergency Medicine

## 2013-11-19 MED ORDER — NITROFURANTOIN MONOHYD MACRO 100 MG PO CAPS: 100.0000 mg | ORAL_CAPSULE | Freq: Two times a day (BID) | ORAL | Status: AC

## 2013-11-21 NOTE — Addendum Note (Signed)
Addended by: Kelby Aline R on: 11/21/2013 11:21 AM   Modules accepted: Orders

## 2013-11-30 ENCOUNTER — Other Ambulatory Visit: Payer: Self-pay | Admitting: *Deleted

## 2013-11-30 DIAGNOSIS — M79609 Pain in unspecified limb: Secondary | ICD-10-CM

## 2013-12-16 ENCOUNTER — Other Ambulatory Visit: Payer: Self-pay | Admitting: *Deleted

## 2013-12-16 DIAGNOSIS — N390 Urinary tract infection, site not specified: Secondary | ICD-10-CM

## 2013-12-16 DIAGNOSIS — E119 Type 2 diabetes mellitus without complications: Secondary | ICD-10-CM

## 2013-12-16 DIAGNOSIS — R0602 Shortness of breath: Secondary | ICD-10-CM

## 2013-12-16 DIAGNOSIS — E1159 Type 2 diabetes mellitus with other circulatory complications: Secondary | ICD-10-CM

## 2013-12-16 DIAGNOSIS — Z794 Long term (current) use of insulin: Secondary | ICD-10-CM

## 2013-12-16 DIAGNOSIS — IMO0001 Reserved for inherently not codable concepts without codable children: Secondary | ICD-10-CM

## 2013-12-16 MED ORDER — CANAGLIFLOZIN 300 MG PO TABS: 300.0000 mg | ORAL_TABLET | Freq: Every day | ORAL | Status: DC

## 2013-12-30 ENCOUNTER — Ambulatory Visit (INDEPENDENT_AMBULATORY_CARE_PROVIDER_SITE_OTHER): Payer: Medicare Other | Admitting: Internal Medicine

## 2013-12-30 ENCOUNTER — Encounter: Payer: Self-pay | Admitting: Vascular Surgery

## 2013-12-30 ENCOUNTER — Encounter: Payer: Self-pay | Admitting: Internal Medicine

## 2013-12-30 VITALS — BP 132/62 | HR 64 | Temp 97.7°F | Resp 18 | Ht 64.5 in | Wt 207.8 lb

## 2013-12-30 DIAGNOSIS — Z1212 Encounter for screening for malignant neoplasm of rectum: Secondary | ICD-10-CM

## 2013-12-30 DIAGNOSIS — Z79899 Other long term (current) drug therapy: Secondary | ICD-10-CM

## 2013-12-30 DIAGNOSIS — E1029 Type 1 diabetes mellitus with other diabetic kidney complication: Secondary | ICD-10-CM

## 2013-12-30 DIAGNOSIS — Z0000 Encounter for general adult medical examination without abnormal findings: Secondary | ICD-10-CM

## 2013-12-30 DIAGNOSIS — I10 Essential (primary) hypertension: Secondary | ICD-10-CM

## 2013-12-30 DIAGNOSIS — Z789 Other specified health status: Secondary | ICD-10-CM

## 2013-12-30 DIAGNOSIS — E785 Hyperlipidemia, unspecified: Secondary | ICD-10-CM

## 2013-12-30 DIAGNOSIS — E559 Vitamin D deficiency, unspecified: Secondary | ICD-10-CM | POA: Insufficient documentation

## 2013-12-30 LAB — CBC WITH DIFFERENTIAL/PLATELET
BASOS ABS: 0 10*3/uL (ref 0.0–0.1)
Basophils Relative: 0 % (ref 0–1)
EOS ABS: 0.5 10*3/uL (ref 0.0–0.7)
EOS PCT: 5 % (ref 0–5)
HCT: 35.5 % — ABNORMAL LOW (ref 36.0–46.0)
Hemoglobin: 11.7 g/dL — ABNORMAL LOW (ref 12.0–15.0)
Lymphocytes Relative: 28 % (ref 12–46)
Lymphs Abs: 2.5 10*3/uL (ref 0.7–4.0)
MCH: 28.2 pg (ref 26.0–34.0)
MCHC: 33 g/dL (ref 30.0–36.0)
MCV: 85.5 fL (ref 78.0–100.0)
MONO ABS: 0.8 10*3/uL (ref 0.1–1.0)
Monocytes Relative: 9 % (ref 3–12)
Neutro Abs: 5.2 10*3/uL (ref 1.7–7.7)
Neutrophils Relative %: 58 % (ref 43–77)
Platelets: 377 10*3/uL (ref 150–400)
RBC: 4.15 MIL/uL (ref 3.87–5.11)
RDW: 14.1 % (ref 11.5–15.5)
WBC: 9 10*3/uL (ref 4.0–10.5)

## 2013-12-30 LAB — HEPATIC FUNCTION PANEL
ALBUMIN: 4.2 g/dL (ref 3.5–5.2)
ALT: 20 U/L (ref 0–35)
AST: 23 U/L (ref 0–37)
Alkaline Phosphatase: 35 U/L — ABNORMAL LOW (ref 39–117)
Bilirubin, Direct: 0.1 mg/dL (ref 0.0–0.3)
Indirect Bilirubin: 0.2 mg/dL (ref 0.2–1.2)
TOTAL PROTEIN: 6.5 g/dL (ref 6.0–8.3)
Total Bilirubin: 0.3 mg/dL (ref 0.2–1.2)

## 2013-12-30 LAB — BASIC METABOLIC PANEL WITH GFR
BUN: 23 mg/dL (ref 6–23)
CO2: 33 mEq/L — ABNORMAL HIGH (ref 19–32)
CREATININE: 0.9 mg/dL (ref 0.50–1.10)
Calcium: 9.4 mg/dL (ref 8.4–10.5)
Chloride: 101 mEq/L (ref 96–112)
GFR, Est African American: 69 mL/min
GFR, Est Non African American: 60 mL/min
Glucose, Bld: 119 mg/dL — ABNORMAL HIGH (ref 70–99)
Potassium: 5.3 mEq/L (ref 3.5–5.3)
Sodium: 139 mEq/L (ref 135–145)

## 2013-12-30 LAB — LIPID PANEL
Cholesterol: 175 mg/dL (ref 0–200)
HDL: 41 mg/dL (ref >39)
LDL CALC: 77 mg/dL (ref 0–99)
TRIGLYCERIDES: 285 mg/dL — AB (ref <150)
Total CHOL/HDL Ratio: 4.3 Ratio
VLDL: 57 mg/dL — ABNORMAL HIGH (ref 0–40)

## 2013-12-30 LAB — TSH: TSH: 1.128 u[IU]/mL (ref 0.350–4.500)

## 2013-12-30 LAB — MAGNESIUM: MAGNESIUM: 2 mg/dL (ref 1.5–2.5)

## 2013-12-30 NOTE — Progress Notes (Signed)
Patient ID: Catherine Wood, female   DOB: 06-12-1931, 80 y.o.   MRN: [id number]   Annual Screening Comprehensive Examination  This very nice 80 y.o. female presents for complete physical.  Patient has been followed for HTN, T1 IDDM, Hyperlipidemia, and Vitamin D Deficiency.    HTN predates since  90 . Patient's BP has been controlled at home. Today's BP: 132/62 mmHg. Patient denies any cardiac symptoms as chest pain, palpitations, shortness of breath, dizziness or ankle swelling.   Patient's hyperlipidemia is controlled with diet and medications. Patient denies myalgias or other medication SE's. Last cholesterol last visit was 183, triglycerides 312, HDL 44 and LDL 77 in Jan 2015 - at goal.    Patient has T1 IDDM predating since 2003 w/Stage 3 CKD(GFR 40 ml/min) predating with last A1c 8.9% in Jan 2015. She is on Metformin and Invokana and in addition is on BID dosing of both Lantus and Humalog Mix and reports CBG's range 125-140 mg%.  Patient denies reactive hypoglycemic symptoms, visual blurring, diabetic polys, but does have burning pains of the ankles and feet.  paresthesias.    Finally, patient has history of Vitamin D Deficiency with last vitamin D 64 in Jan 2015.  Medication Sig  . aspirin 81 MG tablet Take 81 mg by mouth daily.  . Calcium Carbonate (CALCIUM 600 PO) Take 600 mg by mouth daily.  . Canagliflozin (INVOKANA) 300 MG TABS Take 1 tablet (300 mg total) by mouth daily.  . Cholecalciferol (VITAMIN D3) 5000 UNITS CAPS Take 5,000 Units by mouth 3 (three) times daily.   . Cinnamon 500 MG capsule Take 500 mg by mouth daily.  . fenofibrate 160 MG tablet Take 160 mg by mouth daily.  . ferrous fumarate (HEMOCYTE - 106 MG) 325 MG  Take 1 tablet by mouth daily.  . furosemide (LASIX) 40 MG tablet Take 1 tablet (40 mg total) by mouth daily.  . insulin glargine (LANTUS) 100 UNIT/ML injection Inject into the skin 2 (two) times daily. 40 units BID  . Insulin Pen Needle (PEN NEEDLES 29GX1/2") 29G  X 12MM MISC USE 4 TIMES A DAY  . levothyroxine  (LEVOTHROID) 100 MCG tablet Take 100 mcg by mouth daily before breakfast.  . losartan (COZAAR) 100 MG tablet Take 100 mg by mouth daily. 1/2 daily = 50 mg  . Magnesium 400 MG CAPS Take by mouth 4 (four) times daily.  . metFORMIN (GLUCOPHAGE) 1000 MG tablet Take 1 tablet (1,000 mg total) by mouth 2 (two) times daily with a meal.  . Multiple Vitamins-Minerals (ICAPS PO) Take by mouth daily.  . Omega-3 Fatty Acids (FISH OIL) 1000 MG CAPS Take by mouth daily.  Marland Kitchen OVER THE COUNTER MEDICATION as needed. Macular Degeneration refresh eye drops  . pravastatin (PRAVACHOL) 40 MG tablet Take 40 mg by mouth daily.   . ranitidine (ZANTAC) 300 MG capsule Take 300 mg by mouth every evening.   Allergies  Allergen Reactions  . Detrol [Tolterodine]    Past Medical History  Diagnosis Date  . Hypertension   . Hypothyroid   . IDDM (insulin dependent diabetes mellitus)   . Hyperlipidemia   . Macular degeneration    Past Surgical History  Procedure Laterality Date  . Biopsy thyroid      benign thyroid nodule removal  . Varicose vein surgery    . Cataract extraction, bilateral    . Endometrial biopsy  2000   Family History  Problem Relation Age of Onset  . Cancer Mother  colon  . Cancer Father     GI   History  Substance Use Topics  . Smoking status: Never Smoker   . Smokeless tobacco: Not on file  . Alcohol Use: No    ROS Constitutional: Denies fever, chills, weight loss/gain, headaches, insomnia, fatigue, night sweats, and change in appetite. Eyes: Denies redness, blurred vision, diplopia, discharge, itchy, watery eyes.  ENT: Denies discharge, congestion, post nasal drip, epistaxis, sore throat, earache, hearing loss, dental pain, Tinnitus, Vertigo, Sinus pain, snoring.  Cardio: Denies chest pain, palpitations, irregular heartbeat, syncope, dyspnea, diaphoresis, orthopnea, PND, claudication, edema Respiratory: denies cough, dyspnea, DOE,  pleurisy, hoarseness, laryngitis, wheezing.  Gastrointestinal: Denies dysphagia, heartburn, reflux, water brash, pain, cramps, nausea, vomiting, bloating, diarrhea, constipation, hematemesis, melena, hematochezia, jaundice, hemorrhoids Genitourinary: Denies dysuria, frequency, urgency, nocturia, hesitancy, discharge, hematuria, flank pain Breast:Breast lumps, nipple discharge, bleeding.  Musculoskeletal: Denies arthralgia, myalgia, stiffness, Jt. Swelling, limp, and strain/sprain. Does have LBP w/Left sciatica. Skin: Denies puritis, rash, hives, warts, acne, eczema, changing in skin lesion Neuro: No weakness, tremor, incoordination, spasms, paresthesia, pain. Denies falls. Psychiatric: Denies confusion, memory loss, sensory loss. Denies depresion. Endocrine: Denies change in weight, skin, hair change, nocturia, and paresthesia, diabetic polys, visual blurring, hyper / hypo glycemic episodes.  Heme/Lymph: No excessive bleeding, bruising, enlarged lymph nodes.  Physical Exam  BP 132/62  Pulse 64  Temp(Src) 97.7 F (36.5 C) (Temporal)  Resp 18  Ht 5' 4.5" (1.638 m)  Wt 207 lb 12.8 oz (94.257 kg)  BMI 35.13 kg/m2  General Appearance: Well nourished, in no apparent distress. Eyes: PERRLA, EOMs, conjunctiva no swelling or erythema, normal fundi and vessels. Sinuses: No frontal/maxillary tenderness ENT/Mouth: EACs patent / TMs  nl. Nares clear without erythema, swelling, mucoid exudates. Oral hygiene is good. No erythema, swelling, or exudate. Tongue normal, non-obstructing. Tonsils not swollen or erythematous. Hearing normal.  Neck: Supple, thyroid normal. No bruits, nodes or JVD. Respiratory: Respiratory effort normal.  BS equal and clear bilateral without rales, rhonci, wheezing or stridor. Cardio: Heart sounds are normal with regular rate and rhythm and no murmurs, rubs or gallops. Peripheral pulses are normal and equal bilaterally without edema. No aortic or femoral bruits. Chest:  symmetric with normal excursions and percussion. Breasts: Symmetric, without lumps, nipple discharge, retractions, or fibrocystic changes.  Abdomen: Flat, soft, with bowl sounds. Nontender, no guarding, rebound, hernias, masses, or organomegaly.  Lymphatics: Non tender without lymphadenopathy.  Genitourinary:  Musculoskeletal: Full ROM all peripheral extremities, joint stability, 5/5 strength, and normal gait. Skin: Warm and dry without rashes, lesions, cyanosis, clubbing or  ecchymosis.  Neuro: Cranial nerves intact, reflexes equal bilaterally. Normal muscle tone, no cerebellar symptoms. Sensation intact.  Pysch: Awake and oriented X 3, normal affect, Insight and Judgment appropriate.   Assessment and Plan  1. Annual Screening Examination 2. Hypertension  3. Hyperlipidemia 4. T1 IDDM w/Stage 3 CKD 5. Vitamin D Deficiency 6. Hypothyroidism  Continue prudent diet as discussed, weight control, BP monitoring, regular exercise, and medications. Discussed med's effects and SE's. Screening labs and tests as requested with regular follow-up as recommended.

## 2013-12-31 ENCOUNTER — Ambulatory Visit (HOSPITAL_COMMUNITY)
Admission: RE | Admit: 2013-12-31 | Discharge: 2013-12-31 | Disposition: A | Discharge status: Home / Self Care | Payer: Medicare Other | Source: Ambulatory Visit | Attending: Vascular Surgery | Admitting: Vascular Surgery

## 2013-12-31 ENCOUNTER — Encounter: Payer: Self-pay | Admitting: Vascular Surgery

## 2013-12-31 ENCOUNTER — Other Ambulatory Visit: Payer: Self-pay | Admitting: *Deleted

## 2013-12-31 ENCOUNTER — Ambulatory Visit (INDEPENDENT_AMBULATORY_CARE_PROVIDER_SITE_OTHER): Payer: Medicare Other | Admitting: Vascular Surgery

## 2013-12-31 VITALS — BP 129/67 | HR 80 | Resp 18 | Ht 66.0 in | Wt 205.2 lb

## 2013-12-31 DIAGNOSIS — M79609 Pain in unspecified limb: Secondary | ICD-10-CM | POA: Insufficient documentation

## 2013-12-31 DIAGNOSIS — N390 Urinary tract infection, site not specified: Secondary | ICD-10-CM

## 2013-12-31 DIAGNOSIS — E119 Type 2 diabetes mellitus without complications: Secondary | ICD-10-CM

## 2013-12-31 DIAGNOSIS — I83893 Varicose veins of bilateral lower extremities with other complications: Secondary | ICD-10-CM

## 2013-12-31 DIAGNOSIS — Z794 Long term (current) use of insulin: Secondary | ICD-10-CM

## 2013-12-31 DIAGNOSIS — E1159 Type 2 diabetes mellitus with other circulatory complications: Secondary | ICD-10-CM

## 2013-12-31 DIAGNOSIS — R0602 Shortness of breath: Secondary | ICD-10-CM

## 2013-12-31 DIAGNOSIS — I872 Venous insufficiency (chronic) (peripheral): Secondary | ICD-10-CM

## 2013-12-31 DIAGNOSIS — IMO0001 Reserved for inherently not codable concepts without codable children: Secondary | ICD-10-CM

## 2013-12-31 LAB — URINALYSIS, MICROSCOPIC ONLY
BACTERIA UA: NONE SEEN
CASTS: NONE SEEN
Crystals: NONE SEEN
Squamous Epithelial / LPF: NONE SEEN

## 2013-12-31 LAB — MICROALBUMIN / CREATININE URINE RATIO
Creatinine, Urine: 45 mg/dL
MICROALB UR: 0.75 mg/dL (ref 0.00–1.89)
Microalb Creat Ratio: 16.7 mg/g (ref 0.0–30.0)

## 2013-12-31 LAB — VITAMIN D 25 HYDROXY (VIT D DEFICIENCY, FRACTURES): VIT D 25 HYDROXY: 62 ng/mL (ref 30–89)

## 2013-12-31 LAB — HEMOGLOBIN A1C
Hgb A1c MFr Bld: 8.1 % — ABNORMAL HIGH (ref <5.7)
MEAN PLASMA GLUCOSE: 186 mg/dL — AB (ref <117)

## 2013-12-31 MED ORDER — CANAGLIFLOZIN 300 MG PO TABS: 300.0000 mg | ORAL_TABLET | Freq: Every day | ORAL | Status: DC

## 2013-12-31 NOTE — Progress Notes (Signed)
Referred by:  [name], MD [address] [place], [place] [address]  Reason for referral: B claudication  History of Present Illness  Catherine Wood is a 80 y.o. (05/25/1931) female who presents with chief complaint: bilateral leg swelling and pain.  Onset of symptom occurred years ago but has worsened over the last few months.  This patient complains of swelling as the day goes along associated with burning pain in both calves.  She has had prior vein stripping in the 1970s..The patient denies any intermittent claudication or rest pain.  The patient has a known family history of vein disorders, history of DVT previous requiring anticoagulation and history of pregnancy.  She has used compression stocking previously but not recently.  Atherosclerotic risk factors include: HTN, IDDM.  Past Medical History  Diagnosis Date  . Hypertension   . Hypothyroid   . IDDM (insulin dependent diabetes mellitus)   . Hyperlipidemia   . Macular degeneration   . Varicose veins   . Leg swelling     Past Surgical History  Procedure Laterality Date  . Biopsy thyroid      benign thyroid nodule removal  . Varicose vein surgery    . Cataract extraction, bilateral    . Endometrial biopsy  2000    History   Social History  . Marital Status: Single    Spouse Name: N/A    Number of Children: N/A  . Years of Education: N/A   Occupational History  . Not on file.   Social History Main Topics  . Smoking status: Never Smoker   . Smokeless tobacco: Not on file  . Alcohol Use: No  . Drug Use: Not on file  . Sexual Activity: Not on file   Other Topics Concern  . Not on file   Social History Narrative  . No narrative on file    Family History  Problem Relation Age of Onset  . Cancer Mother     colon  . Cancer Father     GI    Current Outpatient Prescriptions on File Prior to Visit  Medication Sig Dispense Refill  . aspirin 81 MG tablet Take 81 mg by mouth daily.        . Calcium Carbonate (CALCIUM 600 PO) Take 600 mg by mouth daily.      . Cholecalciferol (VITAMIN D3) 5000 UNITS CAPS Take 5,000 Units by mouth 3 (three) times daily.       . Cinnamon 500 MG capsule Take 500 mg by mouth daily.      . fenofibrate 160 MG tablet Take 160 mg by mouth daily.      . ferrous fumarate (HEMOCYTE - 106 MG FE) 325 (106 FE) MG TABS tablet Take 1 tablet by mouth daily.      . furosemide (LASIX) 40 MG tablet Take 1 tablet (40 mg total) by mouth daily.  90 tablet  0  . insulin glargine (LANTUS) 100 UNIT/ML injection Inject into the skin 2 (two) times daily. 40 units BID      . Insulin Lispro Prot & Lispro (HUMALOG MIX 75/25 KWIKPEN) (75-25) 100 UNIT/ML Kwikpen USE AS DIRECTED. Inject into skin2 times daily with a meal. 35 units in the morning and 15 units in the evening      . Insulin Pen Needle (PEN NEEDLES 29GX1/2") 29G X 12MM MISC USE 4 TIMES A DAY  100 each  6  . levothyroxine (SYNTHROID, LEVOTHROID) 100 MCG tablet Take 100 mcg by  mouth daily before breakfast.      . losartan (COZAAR) 100 MG tablet Take 100 mg by mouth daily. 1/2 daily = 50 mg      . Magnesium 400 MG CAPS Take by mouth 4 (four) times daily.      . metFORMIN (GLUCOPHAGE) 1000 MG tablet Take 1 tablet (1,000 mg total) by mouth 2 (two) times daily with a meal.  60 tablet  2  . Multiple Vitamins-Minerals (ICAPS PO) Take by mouth daily.      . Omega-3 Fatty Acids (FISH OIL) 1000 MG CAPS Take by mouth daily.      Marland Kitchen OVER THE COUNTER MEDICATION as needed. Macular Degeneration refresh eye drops      . pravastatin (PRAVACHOL) 40 MG tablet Take 40 mg by mouth daily.       . pregabalin (LYRICA) 75 MG capsule Take 75 mg by mouth 2 (two) times daily.      . ranitidine (ZANTAC) 300 MG capsule Take 300 mg by mouth every evening.      . piroxicam (FELDENE) 10 MG capsule        No current facility-administered medications on file prior to visit.    Allergies  Allergen Reactions  . Detrol [Tolterodine]     REVIEW OF  SYSTEMS:  (Positives checked otherwise negative)  CARDIOVASCULAR:  [ ]  chest pain, [ ]  chest pressure, [ ]  palpitations, [ ]  shortness of breath when laying flat, [x]  shortness of breath with exertion,   [ x] pain in feet when walking, [ x] pain in feet when laying flat, [ ]  history of blood clot in veins (DVT), [ ]  history of phlebitis, [ x] swelling in legs, [ x] varicose veins  PULMONARY:  [ ]  productive cough, [ ]  asthma, [ ]  wheezing  NEUROLOGIC:  [x ] weakness in arms or legs, [x ] numbness in arms or legs, [ ]  difficulty speaking or slurred speech, [ x] temporary loss of vision in one eye, [ x] dizziness  HEMATOLOGIC:  [ ]  bleeding problems, [ ]  problems with blood clotting too easily  MUSCULOSKEL:  [ ]  joint pain, [ ]  joint swelling  GASTROINTEST:  [ ]   Vomiting blood, [ ]   Blood in stool     GENITOURINARY:  [ x]  Burning with urination, [ ]   Blood in urine  PSYCHIATRIC:  [ ]  history of major depression  INTEGUMENTARY:  [ ]  rashes, [ ]  ulcers  CONSTITUTIONAL:  [ ]  fever, [ ]  chills  For VQI Use Only  PRE-ADM LIVING: Home  AMB STATUS: Ambulatory  CAD Sx: None  PRIOR CHF: None  STRESS TEST: [x]  No, [ ]  Normal, [ ]  + ischemia, [ ]  + MI, [ ]  Both  Physical Examination Filed Vitals:   12/31/13 1140  BP: 129/67  Pulse: 80  Resp: 18  Height: 5\' 6"  (1.676 m)  Weight: 205 lb 3.2 oz (93.078 kg)   Body mass index is 33.14 kg/(m^2).  General: A&O x 3, WD, mildly obese  Head: [place]/AT  Ear/Nose/Throat: Hearing grossly intact, nares w/o erythema or drainage, oropharynx w/o Erythema/Exudate  Eyes: PERRLA, EOMI  Neck: Supple, no nuchal rigidity, no palpable LAD  Pulmonary: Sym exp, good air movt, CTAB, no rales, rhonchi, & wheezing  Cardiac: RRR, Nl S1, S2, no Murmurs, rubs or gallops  Vascular: Vessel Right Left  Radial Palpable Palpable  Brachial  Palpable Palpable  Carotid Palpable, without bruit Palpable, without bruit  Aorta Not palpable N/A  Femoral  Palpable  Palpable  Popliteal Not palpable Not palpable  PT Palpable Palpable  DP Palpable Palpable   Gastrointestinal: soft, NTND, -G/R, - HSM, - masses, - CVAT B  Musculoskeletal: M/S 5/5 throughout  , Extremities without ischemic changes , bilateral extensive varicosities and spider veins, edema 1+, + B LDS  Neurologic: CN 2-12 intact , Pain and light touch intact in extremities , Motor exam as listed above  Psychiatric: Judgment intact, Mood & affect appropriatefor pt's clinical situation  Dermatologic: See M/S exam for extremity exam, no rashes otherwise noted  Lymph : No Cervical, Axillary, or Inguinal lymphadenopathy   Non-Invasive Vascular Imaging  ABI (Date: 12/31/2013)  R: 1.24, DP: tri, PT: tri, TBI: 0.88  L: 1.23, DP: tri, PT: tri, TBI: 0.67  Outside Studies/Documentation 4 pages of outside documents were reviewed including: outside PCP chart.  Medical Decision Making  Catherine Wood is a 80 y.o. female who presents with: chronic venous insufficiency (C4), s/p previous history of venous stripping, no evidence of PAD   Pt likely has sx CVI.  I have ordered a BLE venous insufficiency duplex.  She will likely need 20-30 mm Hg thigh high compression stockings but I will defer that rx until the studies are available in 2-4 weeks, when the patient will follow up with me.    Thank you for allowing [place] to participate in this patient's care.  Adele Barthel, MD [facility] Office: [phone] Pager: [phone]  12/31/2013, 1:28 PM

## 2014-01-14 ENCOUNTER — Other Ambulatory Visit: Payer: Self-pay | Admitting: Internal Medicine

## 2014-01-14 DIAGNOSIS — Z1231 Encounter for screening mammogram for malignant neoplasm of breast: Secondary | ICD-10-CM

## 2014-01-28 ENCOUNTER — Encounter: Payer: Self-pay | Admitting: Internal Medicine

## 2014-02-07 ENCOUNTER — Other Ambulatory Visit: Payer: Self-pay | Admitting: *Deleted

## 2014-02-07 DIAGNOSIS — M7989 Other specified soft tissue disorders: Secondary | ICD-10-CM

## 2014-02-10 ENCOUNTER — Encounter: Payer: Self-pay | Admitting: Vascular Surgery

## 2014-02-11 ENCOUNTER — Ambulatory Visit (HOSPITAL_COMMUNITY)
Admission: RE | Admit: 2014-02-11 | Discharge: 2014-02-11 | Disposition: A | Discharge status: Home / Self Care | Payer: Medicare Other | Source: Ambulatory Visit | Attending: Vascular Surgery | Admitting: Vascular Surgery

## 2014-02-11 ENCOUNTER — Encounter: Payer: Self-pay | Admitting: Vascular Surgery

## 2014-02-11 ENCOUNTER — Ambulatory Visit (INDEPENDENT_AMBULATORY_CARE_PROVIDER_SITE_OTHER): Payer: Medicare Other | Admitting: Vascular Surgery

## 2014-02-11 VITALS — BP 136/44 | HR 68 | Ht 66.0 in | Wt 199.1 lb

## 2014-02-11 DIAGNOSIS — M7989 Other specified soft tissue disorders: Secondary | ICD-10-CM | POA: Insufficient documentation

## 2014-02-11 DIAGNOSIS — I872 Venous insufficiency (chronic) (peripheral): Secondary | ICD-10-CM

## 2014-02-11 DIAGNOSIS — I83893 Varicose veins of bilateral lower extremities with other complications: Secondary | ICD-10-CM

## 2014-02-11 NOTE — Progress Notes (Signed)
    Established Venous Insufficiency  History of Present Illness  Catherine Wood is a 80 y.o. (07-10-1931) female who presents with chief complaint: follow up.  The patient's symptoms have not progressed.  The patient's symptoms are: swelling and burning in calves.  The patient's treatment regimen currently included: maximal medical management.  The patient's PMH, PSH, SH, FamHx, Med, and Allergies are unchanged from 01/01/15.  On ROS today: no intermittent claudication, no rest pain  Physical Examination  Filed Vitals:   02/11/14 1151  BP: 136/44  Pulse: 68  Height: 5\' 6"  (1.676 m)  Weight: 199 lb 1.6 oz (90.311 kg)  SpO2: 97%   Body mass index is 32.15 kg/(m^2).  General: A&O x 3, WDWN  Pulmonary: Sym exp, good air movt, CTAB, no rales, rhonchi, & wheezing  Cardiac: RRR, Nl S1, S2, no Murmurs, rubs or gallops Vascular:  Vessel  Right  Left   Radial  Palpable  Palpable   Brachial  Palpable  Palpable   Carotid  Palpable, without bruit  Palpable, without bruit   Aorta  Not palpable  N/A   Femoral  Palpable  Palpable   Popliteal  Not palpable  Not palpable   PT  Palpable  Palpable   DP  Palpable  Palpable    Gastrointestinal: soft, NTND, -G/R, - HSM, - masses, - CVAT B   Musculoskeletal: M/S 5/5 throughout , Extremities without ischemic changes , bilateral extensive varicosities and spider veins, + B LDS   Neurologic: Pain and light touch intact in extremities , Motor exam as listed above   Non-Invasive Vascular Imaging  BLE Venous Insufficiency Duplex (Date: 02/11/2014):   RLE: no DVT and SVT, + ASV vs duplicate GSV reflux, + deep venous reflux: CFV, SFV, + SSV reflux  LLE: no DVT and SVT, + ASV vs GSV reflux, + deep venous reflux: CFV, SFV, +SSV reflux  Medical Decision Making  Catherine Wood is a 80 y.o. female who presents with: B leg chronic venous insufficiency (C4), neoangiogensis L venous system, s/p BLE vein stripping, BLE varicosities with  complications   Studies are consistent with either recannulation of both GSV in setting of prior short segment stripping or duplicated system.  L leg demonstrates neoangiogensis of the venous system proximally.  Regardless, based on the patient's vascular studies and examination, I have offered the patient: compressive therapy.  I discussed with the patient the use of her 20-30 mm thigh high compression stockings and need for 3 month trial of such.  After a 3 months, she will follow up with my partners for evaluation in the [facility].  She may be a candidate for a EVLA R GSV/ASV and SSV.  L leg likely is going to need stab phlebectomy to treat that leg.  Thank you for allowing [place] to participate in this patient's care.  Adele Barthel, MD [facility] Office: [phone] Pager: [phone]  02/11/2014, 12:10 PM

## 2014-02-14 ENCOUNTER — Ambulatory Visit
Admission: RE | Admit: 2014-02-14 | Discharge: 2014-02-14 | Disposition: A | Discharge status: Home / Self Care | Payer: Medicare Other | Source: Ambulatory Visit | Attending: Internal Medicine | Admitting: Internal Medicine

## 2014-02-14 DIAGNOSIS — Z1231 Encounter for screening mammogram for malignant neoplasm of breast: Secondary | ICD-10-CM

## 2014-03-07 ENCOUNTER — Other Ambulatory Visit: Payer: Self-pay | Admitting: Internal Medicine

## 2014-03-16 ENCOUNTER — Other Ambulatory Visit: Payer: Self-pay | Admitting: Physician Assistant

## 2014-03-31 ENCOUNTER — Ambulatory Visit: Payer: Self-pay | Admitting: Physician Assistant

## 2014-04-03 NOTE — Progress Notes (Signed)
MEDICARE ANNUAL WELLNESS VISIT AND FOLLOW UP  Assessment:   1. Hypothyroidism, unspecified hypothyroidism type - TSH  2. T1 IDDM w/Stage 3 CKD (GFR 40 ml/min) Discussed general issues about diabetes pathophysiology and management., Educational material distributed., Suggested low cholesterol diet., Encouraged aerobic exercise., Discussed foot care., Reminded to get yearly retinal exam. - Hemoglobin A1c - [facility] DIABETES FOOT EXAM  3. Encounter for long-term (current) use of other medications - Magnesium  4. Hyperlipidemia - Lipid panel  5. Vitamin D Deficiency - Vit D  25 hydroxy (rtn osteoporosis monitoring)  6. Essential hypertension, benign - CBC with Differential - BASIC METABOLIC PANEL WITH GFR - Hepatic function panel  7. Need for vaccination with 13-polyvalent pneumococcal conjugate vaccine - Pneumococcal conjugate vaccine 13-valent IM  TETANUS AT CPE  Plan:   During the course of the visit the patient was educated and counseled about appropriate screening and preventive services including:    Pneumococcal vaccine   Influenza vaccine  Td vaccine  Screening electrocardiogram  Screening mammography  Bone densitometry screening  Colorectal cancer screening  Diabetes screening  Glaucoma screening  Nutrition counseling   Advanced directives: given info/requested  Screening recommendations, referrals:  Vaccinations: Tdap vaccine ordered Influenza vaccine continue to get yearly Pneumococcal vaccine needs Prevnar 13 Shingles vaccine not indicated Hep B vaccine not indicated  Nutrition assessed and recommended  Colonoscopy due 2016 Mammogram due 2016 Pap smear not indicated Pelvic exam not indicated Recommended yearly ophthalmology/optometry visit for glaucoma screening and checkup Recommended yearly dental visit for hygiene and checkup Advanced directives - requested  Conditions/risks identified: BMI: Discussed weight loss, diet, and increase  physical activity.  Increase physical activity: AHA recommends 150 minutes of physical activity a week.  Medications reviewed DEXA- requested Diabetes is not at goal, ACE/ARB therapy: Yes. Urinary Incontinence is not an issue: discussed non pharmacology and pharmacology options.  Fall risk: moderate- discussed PT, home fall assessment, medications.    Subjective:   Catherine Wood is a 80 y.o. female who presents for Medicare Annual Wellness Visit and 3 month follow up on hypertension, diabetes, hyperlipidemia, vitamin D def.  Date of last medicare wellness visit is unknown.   Her blood pressure has been controlled at home, today their BP is BP: 128/62 mmHg She does not workout. She denies chest pain, shortness of breath, dizziness.  She is on cholesterol medication and denies myalgias. Her cholesterol is at goal. The cholesterol last visit was:   Lab Results  Component Value Date   CHOL 175 12/30/2013   HDL 41 12/30/2013   LDLCALC 77 12/30/2013   TRIG 285* 12/30/2013   CHOLHDL 4.3 12/30/2013   She has been working on diet and exercise for diabetes, she has Stage 3 CKD due to DM and complains of bilateral feet paresthesias she is on lantus 40 and 40, humolog mix is 35 and 15 units, , she did have 1 episode of hypoglycemia that she felt, sugar at 70 when she checked, and denies hypoglycemia , polydipsia and polyuria. Last A1C in the office was:  Lab Results  Component Value Date   HGBA1C 8.1* 12/30/2013   Patient is on Vitamin D supplement. Lab Results  Component Value Date   VD25OH 90 12/30/2013     She is on thyroid medication. Her medication was not changed last visit. Patient denies nervousness, palpitations and weight changes.  Lab Results  Component Value Date   TSH 1.128 12/30/2013  .  She BMI is Body mass index is 31.65 kg/(m^2).,  she is working on diet and exercise and has done well.  Wt Readings from Last 3 Encounters:  04/05/14 196 lb (88.905 kg)  02/11/14 199 lb 1.6 oz  (90.311 kg)  12/31/13 205 lb 3.2 oz (93.078 kg)    Names of Other Physician/Practitioners you currently use: 1. [facility]- here for primary care 2. None, dentist, last visit 2 years Patient Care Team: [name], MD as PCP - General (Internal Medicine) Jeryl Columbia, MD as Consulting Physician (Gastroenterology) Lelon Perla, MD as Consulting Physician (Cardiology) Johna Sheriff, MD as Consulting Physician (Ophthalmology)- has appointment august Hurman Horn, MD as Consulting Physician (Ophthalmology) Deneise Lever, MD as Consulting Physician (Pulmonary Disease)  Medication Review Current Outpatient Prescriptions on File Prior to Visit  Medication Sig Dispense Refill  . aspirin 81 MG tablet Take 81 mg by mouth daily.      . Calcium Carbonate (CALCIUM 600 PO) Take 600 mg by mouth daily.      . Canagliflozin (INVOKANA) 300 MG TABS Take 1 tablet (300 mg total) by mouth daily.  30 tablet  0  . Cholecalciferol (VITAMIN D3) 5000 UNITS CAPS Take 5,000 Units by mouth 3 (three) times daily.       . Cinnamon 500 MG capsule Take 500 mg by mouth daily.      . ciprofloxacin (CIPRO) 250 MG tablet       . fenofibrate 160 MG tablet Take 160 mg by mouth daily.      . ferrous fumarate (HEMOCYTE - 106 MG FE) 325 (106 FE) MG TABS tablet Take 1 tablet by mouth daily.      . furosemide (LASIX) 40 MG tablet Take 1 tablet (40 mg total) by mouth daily.  90 tablet  0  . gabapentin (NEURONTIN) 300 MG capsule       . gatifloxacin (ZYMAXID) 0.5 % SOLN       . insulin glargine (LANTUS) 100 UNIT/ML injection Inject into the skin 2 (two) times daily. 40 units BID      . Insulin Lispro Prot & Lispro (HUMALOG MIX 75/25 KWIKPEN) (75-25) 100 UNIT/ML Kwikpen USE AS DIRECTED. Inject into skin2 times daily with a meal. 35 units in the morning and 15 units in the evening      . Insulin Pen Needle (PEN NEEDLES 29GX1/2") 29G X 12MM MISC USE 4 TIMES A DAY  100 each  6  .  LANTUS SOLOSTAR 100 UNIT/ML Solostar Pen       . levothyroxine (SYNTHROID, LEVOTHROID) 100 MCG tablet Take 100 mcg by mouth daily before breakfast.      . losartan (COZAAR) 100 MG tablet Take 100 mg by mouth daily. 1/2 daily = 50 mg      . Magnesium 400 MG CAPS Take by mouth 4 (four) times daily.      . metFORMIN (GLUCOPHAGE) 1000 MG tablet Take 1 tablet (1,000 mg total) by mouth 2 (two) times daily with a meal.  60 tablet  2  . Multiple Vitamins-Minerals (ICAPS PO) Take by mouth daily.      . nitrofurantoin, macrocrystal-monohydrate, (MACROBID) 100 MG capsule       . Omega-3 Fatty Acids (FISH OIL) 1000 MG CAPS Take by mouth daily.      Marland Kitchen OVER THE COUNTER MEDICATION as needed. Macular Degeneration refresh eye drops      . piroxicam (FELDENE) 10 MG capsule TAKE 1 CAPSULE TWICE DAILY WITH FOOD AS NEEDED FOR PAIN  60 capsule  4  . pravastatin (PRAVACHOL) 40 MG tablet TAKE 1 TABLET DAILY FOR CHOLESTEROL  90 tablet  PRN  . prednisoLONE acetate (PRED FORTE) 1 % ophthalmic suspension       . pregabalin (LYRICA) 75 MG capsule Take 75 mg by mouth 2 (two) times daily.      . ranitidine (ZANTAC) 300 MG capsule Take 300 mg by mouth every evening.       No current facility-administered medications on file prior to visit.    Current Problems (verified) Patient Active Problem List   Diagnosis Date Noted  . Varicose veins of lower extremities with other complications 51/76/1607  . Chronic venous insufficiency 12/31/2013  . Vitamin D Deficiency 12/30/2013  . Encounter for long-term (current) use of other medications 12/30/2013  . Hypertension   . Hypothyroid   . T1 IDDM w/Stage 3 CKD (GFR 40 ml/min)   . Hyperlipidemia   . Macular degeneration     Screening Tests Health Maintenance  Topic Date Due  . Ophthalmology Exam  09/12/1942  . Colonoscopy  09/12/1982  . Tetanus/tdap  09/09/2013  . Influenza Vaccine  04/09/2014  . Hemoglobin A1c  07/01/2014  . Foot Exam  11/22/2014  . Urine Microalbumin   12/31/2014  . Pneumococcal Polysaccharide Vaccine Age 6 And Over  Completed  . Zostavax  Completed     Immunization History  Administered Date(s) Administered  . Influenza-Unspecified 06/22/2013  . Pneumococcal-Unspecified 09/09/2004  . Td 09/10/2003  . Zoster 09/09/2005    Preventative care: Last colonoscopy: 2011 due 2016 Last mammogram: 02/2014 Last pap smear/pelvic exam: remote  DEXA: 2013  Prior vaccinations: TD or Tdap: 2005 DUE this year Influenza: 2014 Pneumococcal: 2006  Prevnar 13 DUE Shingles/Zostavax: 2007  History reviewed: allergies, current medications, past family history, past medical history, past social history, past surgical history and problem list  Risk Factors: Osteoporosis: postmenopausal estrogen deficiency and dietary calcium and/or vitamin D deficiency History of fracture in the past year: no  Tobacco History  Substance Use Topics  . Smoking status: Never Smoker   . Smokeless tobacco: Not on file  . Alcohol Use: No   She does not smoke.  Patient is not a former smoker. Are there smokers in your home (other than you)?  No  Alcohol Current alcohol use: none  Caffeine Current caffeine use: coffee 2 /day  Exercise Current exercise: none  Nutrition/Diet Current diet: in general, a "healthy" diet    Cardiac risk factors: advanced age (older than 80 for men, 88 for women), diabetes mellitus, dyslipidemia, hypertension, obesity (BMI >= 30 kg/m2) and sedentary lifestyle.  Depression Screen (Note: if answer to either of the following is "Yes", a more complete depression screening is indicated)   Q1: Over the past two weeks, have you felt down, depressed or hopeless? No  Q2: Over the past two weeks, have you felt little interest or pleasure in doing things? No  Have you lost interest or pleasure in daily life? No  Do you often feel hopeless? No  Do you cry easily over simple problems? No  Activities of Daily Living In your present  state of health, do you have any difficulty performing the following activities?:  Driving? No Managing money?  No Feeding yourself? No Getting from bed to chair? No Climbing a flight of stairs? No Preparing food and eating?: No Bathing or showering? No Getting dressed: No Getting to the toilet? No Using the toilet:No Moving around from place to place: No In the past year have  you fallen or had a near fall?:No   Are you sexually active?  No  Do you have more than one partner?  No  Vision Difficulties: No  Hearing Difficulties: Yes per kids Do you often ask people to speak up or repeat themselves? Yes Do you experience ringing or noises in your ears? No Do you have difficulty understanding soft or whispered voices? Yes  Cognition  Do you feel that you have a problem with memory?No  Do you often misplace items? No  Do you feel safe at home?  Yes  Advanced directives Does patient have a [place]? No Does patient have a Living Will? No   Objective:   Blood pressure 128/62, pulse 72, temperature 98.2 F (36.8 C), resp. rate 16, weight 196 lb (88.905 kg). Body mass index is 31.65 kg/(m^2).  General appearance: alert, no distress, WD/WN,  female Cognitive Testing  Alert? Yes  Normal Appearance?Yes  Oriented to person? Yes  Place? Yes   Time? Yes  Recall of three objects?  Yes  Can perform simple calculations? Yes  Displays appropriate judgment?Yes  Can read the correct time from a watch face?Yes  HEENT: normocephalic, sclerae anicteric, TMs pearly, nares patent, no discharge or erythema, pharynx normal Oral cavity: MMM, no lesions Neck: supple, no lymphadenopathy, no thyromegaly, no masses Heart: RRR, normal S1, S2, no murmurs Lungs: CTA bilaterally, no wheezes, rhonchi, or rales Abdomen: +bs, soft, non tender, non distended, no masses, no hepatomegaly, no splenomegaly Musculoskeletal: nontender, no swelling, no obvious deformity Extremities: no  edema, no cyanosis, no clubbing Pulses: 2+ symmetric, upper and lower extremities, normal cap refill Neurological: alert, oriented x 3, CN2-12 intact, strength normal upper extremities and lower extremities, sensation normal throughout, DTRs 2+ throughout, no cerebellar signs, gait normal Psychiatric: normal affect, behavior normal, pleasant  Breast: defer Gyn: defer Rectal: defer   Medicare Attestation I have personally reviewed: The patient's medical and social history Their use of alcohol, tobacco or illicit drugs Their current medications and supplements The patient's functional ability including ADLs,fall risks, home safety risks, cognitive, and hearing and visual impairment Diet and physical activities Evidence for depression or mood disorders  The patient's weight, height, BMI, and visual acuity have been recorded in the chart.  I have made referrals, counseling, and provided education to the patient based on review of the above and I have provided the patient with a written personalized care plan for preventive services.     Vicie Mutters, PA-C   04/05/2014

## 2014-04-05 ENCOUNTER — Encounter: Payer: Self-pay | Admitting: Physician Assistant

## 2014-04-05 ENCOUNTER — Ambulatory Visit (INDEPENDENT_AMBULATORY_CARE_PROVIDER_SITE_OTHER): Payer: Medicare Other | Admitting: Physician Assistant

## 2014-04-05 VITALS — BP 128/62 | HR 72 | Temp 98.2°F | Resp 16 | Wt 196.0 lb

## 2014-04-05 DIAGNOSIS — E785 Hyperlipidemia, unspecified: Secondary | ICD-10-CM

## 2014-04-05 DIAGNOSIS — E1029 Type 1 diabetes mellitus with other diabetic kidney complication: Secondary | ICD-10-CM

## 2014-04-05 DIAGNOSIS — Z789 Other specified health status: Secondary | ICD-10-CM

## 2014-04-05 DIAGNOSIS — Z23 Encounter for immunization: Secondary | ICD-10-CM

## 2014-04-05 DIAGNOSIS — I10 Essential (primary) hypertension: Secondary | ICD-10-CM

## 2014-04-05 DIAGNOSIS — E559 Vitamin D deficiency, unspecified: Secondary | ICD-10-CM

## 2014-04-05 DIAGNOSIS — Z0000 Encounter for general adult medical examination without abnormal findings: Secondary | ICD-10-CM

## 2014-04-05 DIAGNOSIS — Z1331 Encounter for screening for depression: Secondary | ICD-10-CM

## 2014-04-05 DIAGNOSIS — Z79899 Other long term (current) drug therapy: Secondary | ICD-10-CM

## 2014-04-05 DIAGNOSIS — E039 Hypothyroidism, unspecified: Secondary | ICD-10-CM

## 2014-04-05 LAB — CBC WITH DIFFERENTIAL/PLATELET
Basophils Absolute: 0 10*3/uL (ref 0.0–0.1)
Basophils Relative: 0 % (ref 0–1)
EOS PCT: 4 % (ref 0–5)
Eosinophils Absolute: 0.4 10*3/uL (ref 0.0–0.7)
HEMATOCRIT: 39.3 % (ref 36.0–46.0)
Hemoglobin: 12.9 g/dL (ref 12.0–15.0)
LYMPHS ABS: 2.5 10*3/uL (ref 0.7–4.0)
Lymphocytes Relative: 23 % (ref 12–46)
MCH: 27.1 pg (ref 26.0–34.0)
MCHC: 32.8 g/dL (ref 30.0–36.0)
MCV: 82.6 fL (ref 78.0–100.0)
MONO ABS: 1 10*3/uL (ref 0.1–1.0)
Monocytes Relative: 9 % (ref 3–12)
Neutro Abs: 7 10*3/uL (ref 1.7–7.7)
Neutrophils Relative %: 64 % (ref 43–77)
Platelets: 376 10*3/uL (ref 150–400)
RBC: 4.76 MIL/uL (ref 3.87–5.11)
RDW: 14.7 % (ref 11.5–15.5)
WBC: 11 10*3/uL — AB (ref 4.0–10.5)

## 2014-04-05 NOTE — Patient Instructions (Addendum)
The insulin forces your blood sugar down no matter what it is starting at, so if you are dieting you may have to decrease your insulin. This can cause diabetics to have to eat more to keep their blood sugar elevated which goes against what our goals are for you. If you are starting to have low blood sugars please decrease the humolog mix by 5 units in the morning and 5 units at night. A low blood sugar is much more dangerous than a high blood sugar.   If you start to have low blood sugars FASTING IN THE MORNING, decrease your lantus by 5 units in the morning and 5 units at night.     Bad carbs also include fruit juice, alcohol, and sweet tea. These are empty calories that do not signal to your brain that you are full.   Please remember the good carbs are still carbs which convert into sugar. So please measure them out no more than 1/2-1 cup of rice, oatmeal, pasta, and beans.  Veggies are however free foods! Pile them on.   I like lean protein at every meal such as chicken, [place], pork chops, cottage cheese, etc. Just do not fry these meats and please center your meal around vegetable, the meats should be a side dish.   No all fruit is created equal. Please see the list below, the fruit at the bottom is higher in sugars than the fruit at the top

## 2014-04-06 LAB — BASIC METABOLIC PANEL WITH GFR
BUN: 25 mg/dL — ABNORMAL HIGH (ref 6–23)
CHLORIDE: 94 meq/L — AB (ref 96–112)
CO2: 33 meq/L — AB (ref 19–32)
CREATININE: 1.1 mg/dL (ref 0.50–1.10)
Calcium: 9.7 mg/dL (ref 8.4–10.5)
GFR, Est African American: 54 mL/min — ABNORMAL LOW
GFR, Est Non African American: 47 mL/min — ABNORMAL LOW
GLUCOSE: 152 mg/dL — AB (ref 70–99)
Potassium: 4.1 mEq/L (ref 3.5–5.3)
Sodium: 141 mEq/L (ref 135–145)

## 2014-04-06 LAB — HEPATIC FUNCTION PANEL
ALBUMIN: 4.1 g/dL (ref 3.5–5.2)
ALT: 18 U/L (ref 0–35)
AST: 20 U/L (ref 0–37)
Alkaline Phosphatase: 32 U/L — ABNORMAL LOW (ref 39–117)
Bilirubin, Direct: 0.1 mg/dL (ref 0.0–0.3)
Indirect Bilirubin: 0.3 mg/dL (ref 0.2–1.2)
Total Bilirubin: 0.4 mg/dL (ref 0.2–1.2)
Total Protein: 6.6 g/dL (ref 6.0–8.3)

## 2014-04-06 LAB — LIPID PANEL
Cholesterol: 166 mg/dL (ref 0–200)
HDL: 45 mg/dL (ref >39)
LDL Cholesterol: 71 mg/dL (ref 0–99)
Total CHOL/HDL Ratio: 3.7 Ratio
Triglycerides: 252 mg/dL — ABNORMAL HIGH (ref <150)
VLDL: 50 mg/dL — ABNORMAL HIGH (ref 0–40)

## 2014-04-06 LAB — MAGNESIUM: MAGNESIUM: 1.6 mg/dL (ref 1.5–2.5)

## 2014-04-06 LAB — VITAMIN D 25 HYDROXY (VIT D DEFICIENCY, FRACTURES): VIT D 25 HYDROXY: 52 ng/mL (ref 30–89)

## 2014-04-06 LAB — HEMOGLOBIN A1C
HEMOGLOBIN A1C: 7.4 % — AB (ref <5.7)
Mean Plasma Glucose: 166 mg/dL — ABNORMAL HIGH (ref <117)

## 2014-04-06 LAB — TSH: TSH: 2.727 u[IU]/mL (ref 0.350–4.500)

## 2014-04-11 ENCOUNTER — Other Ambulatory Visit: Payer: Self-pay | Admitting: Physician Assistant

## 2014-05-04 ENCOUNTER — Other Ambulatory Visit: Payer: Self-pay | Admitting: Physician Assistant

## 2014-05-13 ENCOUNTER — Ambulatory Visit: Payer: Medicare Other | Admitting: Vascular Surgery

## 2014-05-18 ENCOUNTER — Other Ambulatory Visit: Payer: Self-pay | Admitting: *Deleted

## 2014-05-18 MED ORDER — INSULIN LISPRO PROT & LISPRO (75-25 MIX) 100 UNIT/ML KWIKPEN: PEN_INJECTOR | SUBCUTANEOUS | Status: DC

## 2014-06-14 ENCOUNTER — Other Ambulatory Visit: Payer: Self-pay | Admitting: *Deleted

## 2014-07-04 ENCOUNTER — Ambulatory Visit (INDEPENDENT_AMBULATORY_CARE_PROVIDER_SITE_OTHER): Payer: Medicare Other | Admitting: Internal Medicine

## 2014-07-04 ENCOUNTER — Encounter: Payer: Self-pay | Admitting: Internal Medicine

## 2014-07-04 VITALS — BP 126/54 | HR 82 | Temp 98.0°F | Resp 18 | Ht 64.0 in | Wt 204.0 lb

## 2014-07-04 DIAGNOSIS — J029 Acute pharyngitis, unspecified: Secondary | ICD-10-CM

## 2014-07-04 DIAGNOSIS — J0410 Acute tracheitis without obstruction: Secondary | ICD-10-CM

## 2014-07-04 MED ORDER — HYDROCODONE-ACETAMINOPHEN 5-325 MG PO TABS: ORAL_TABLET | ORAL | Status: DC

## 2014-07-04 MED ORDER — MELOXICAM 15 MG PO TABS
ORAL_TABLET | ORAL | Status: DC
Start: 2014-07-04 — End: 2014-11-08

## 2014-07-04 MED ORDER — AZITHROMYCIN 250 MG PO TABS: ORAL_TABLET | ORAL | Status: DC

## 2014-07-04 MED ORDER — PREDNISONE 20 MG PO TABS: ORAL_TABLET | ORAL | Status: DC

## 2014-07-04 NOTE — Progress Notes (Signed)
Subjective:    Patient ID: Catherine Wood, female    DOB: 02-07-1931, 80 y.o.   MRN: [id number]  HPI   A very nice 80 yo lady with 24 hr prodrome of "cold" sx's with sore throat and productive cough . No fever, chills, rigors or sweats.    Medication List   aspirin 81 MG tablet  Take 81 mg by mouth daily.     CALCIUM 600 PO  Take 600 mg by mouth daily.     canagliflozin 300 MG Tabs tablet  Commonly known as:  INVOKANA  Take 1 tablet (300 mg total) by mouth daily.     Cinnamon 500 MG capsule  Take 500 mg by mouth daily.     fenofibrate 160 MG tablet  Take 160 mg by mouth daily.     ferrous fumarate 325 (106 FE) MG Tabs tablet  Commonly known as:  HEMOCYTE - 106 mg FE  Take 1 tablet by mouth daily.     Fish Oil 1000 MG Caps  Take by mouth daily.     furosemide 40 MG tablet  Commonly known as:  LASIX  TAKE 1 TO 2 TABLETS DAILY DEPENDING ON SWELLING/EDEMA     gabapentin 300 MG capsule  Commonly known as:  NEURONTIN     gatifloxacin 0.5 % Soln  Commonly known as:  ZYMAXID     ICAPS PO  Take by mouth daily.     insulin glargine 100 UNIT/ML injection  Commonly known as:  LANTUS  Inject into the skin 2 (two) times daily. 40 units BID     LANTUS SOLOSTAR 100 UNIT/ML Solostar Pen  Generic drug:  Insulin Glargine     Insulin Lispro Prot & Lispro (75-25) 100 UNIT/ML Kwikpen  Commonly known as:  HUMALOG MIX 75/25 KWIKPEN  USE AS DIRECTED. Inject into skin2 times daily with a meal. 35 units in the morning and 15 units in the evening     levothyroxine 100 MCG tablet  Commonly known as:  SYNTHROID, LEVOTHROID  Take 100 mcg by mouth daily before breakfast.     losartan 100 MG tablet  Commonly known as:  COZAAR  TAKE 1 TABLET ONCE A DAY FOR HIGH BLOOD PRESSURE     Magnesium 400 MG Caps  Take by mouth 4 (four) times daily.     meloxicam 15 MG tablet  Commonly known as:  MOBIC  Take 1 tablet daily with food for pain & inflammation     metFORMIN 1000 MG tablet  Commonly  known as:  GLUCOPHAGE  TAKE  (1)  TABLET TWICE A DAY WITH MEALS (BREAKFAST AND SUPPER)     OVER THE COUNTER MEDICATION  as needed. Macular Degeneration refresh eye drops     PEN NEEDLES 29GX1/2" 29G X 12MM Misc  USE 4 TIMES A DAY     pravastatin 40 MG tablet  Commonly known as:  PRAVACHOL  TAKE 1 TABLET DAILY FOR CHOLESTEROL     prednisoLONE acetate 1 % ophthalmic suspension  Commonly known as:  PRED FORTE     ranitidine 300 MG capsule  Commonly known as:  ZANTAC  Take 300 mg by mouth every evening.     Vitamin D3 5000 UNITS Caps  Take 5,000 Units by mouth 3 (three) times daily.     Allergies  Allergen Reactions  . Detrol [Tolterodine]    Past Medical History  Diagnosis Date  . Hypertension   . Hypothyroid   . IDDM (insulin dependent diabetes mellitus)   .  Hyperlipidemia   . Macular degeneration   . Varicose veins   . Leg swelling    Past Surgical History  Procedure Laterality Date  . Biopsy thyroid      benign thyroid nodule removal  . Varicose vein surgery    . Cataract extraction, bilateral    . Endometrial biopsy  2000   Review of Systems   Non contributory to above.   Objective:   Physical Exam  BP 126/54  Pulse 82  Temp(Src) 98 F (36.7 C) (Temporal)  Resp 18  Ht 5\' 4"  (1.626 m)  Wt 204 lb (92.534 kg)  BMI 35.00 kg/m2  HEENT - Eac's patent. TM's Nl. EOM's full. PERRLA. NasoOroPharynx 1(+) red w/o exudates.. Neck - supple. Nl Thyroid. Carotids 2+ & No bruits, nodes, JVD Chest - BS w/scattered rales &  Rhonchi - no wheezes. Cor - Nl HS. RRR w/o sig MGR. PP 1(+). No edema. Abd - No palpable organomegaly, masses or tenderness. BS nl. MS- FROM w/o deformities. Muscle power, tone and bulk Nl. Gait Nl. Neuro - No obvious Cr N abnormalities. Sensory, motor and Cerebellar functions appear Nl w/o focal abnormalities. Psyche - Mental status normal & appropriate.  Skin - neg exposed.  Assessment & Plan:   1. Acute pharyngitis, unspecified pharyngitis  type  2. Acute tracheitis  Rx - Z pak & 1 rf , Prednisone taper and prn Norco 5

## 2014-07-04 NOTE — Patient Instructions (Signed)
Mucus Relief 400 mg   Take 2 tablets 3 x day with meals   (= 6 tabs/day) ++++++++++++++++++++++++++++++++++++++++++++   Recommend the book "The END of DIETING" by Dr Baker Janus   and the book "The END of DIABETES " by Dr Excell Seltzer  At [email or web address] - get book & Audio CD's      Being diabetic has a  300% increased risk for heart attack, stroke, cancer, and alzheimer- type vascular dementia. It is very important that you work harder with diet by avoiding all foods that are white except chicken & fish. Avoid white rice (brown & wild rice is OK), white potatoes (sweetpotatoes in moderation is OK), White bread or wheat bread or anything made out of white flour like bagels, donuts, rolls, buns, biscuits, cakes, pastries, cookies, pizza crust, and pasta (made from white flour & egg whites) - vegetarian pasta or spinach or wheat pasta is OK. Multigrain breads like Arnold's or Pepperidge [employer], or multigrain sandwich thins or flatbreads.  Diet, exercise and weight loss can reverse and cure diabetes in the early stages.  Diet, exercise and weight loss is very important in the control and prevention of complications of diabetes which affects every system in your body, ie. Brain - dementia/stroke, eyes - glaucoma/blindness, heart - heart attack/heart failure, kidneys - dialysis, stomach - gastric paralysis, intestines - malabsorption, nerves - severe painful neuritis, circulation - gangrene & loss of a leg(s), and finally cancer and Alzheimers.    I recommend avoid fried & greasy foods,  sweets/candy, white rice (brown or wild rice or Quinoa is OK), white potatoes (sweet potatoes are OK) - anything made from white flour - bagels, doughnuts, rolls, buns, biscuits,white and wheat breads, pizza crust and traditional pasta made of white flour & egg white(vegetarian pasta or spinach or wheat pasta is OK).  Multi-grain bread is OK - like multi-grain flat bread or sandwich thins. Avoid alcohol in excess. Exercise is  also important.    Eat all the vegetables you want - avoid meat, especially red meat and dairy - especially cheese.  Cheese is the most concentrated form of trans-fats which is the worst thing to clog up our arteries. Veggie cheese is OK which can be found in the fresh produce section at [facility] or [employer] or [employer]

## 2014-07-11 ENCOUNTER — Encounter: Payer: Self-pay | Admitting: Internal Medicine

## 2014-07-11 ENCOUNTER — Other Ambulatory Visit: Payer: Self-pay | Admitting: Internal Medicine

## 2014-07-11 ENCOUNTER — Ambulatory Visit (INDEPENDENT_AMBULATORY_CARE_PROVIDER_SITE_OTHER): Payer: Medicare Other | Admitting: Internal Medicine

## 2014-07-11 VITALS — BP 110/64 | HR 76 | Temp 97.3°F | Resp 16 | Ht 64.0 in | Wt 204.8 lb

## 2014-07-11 DIAGNOSIS — E785 Hyperlipidemia, unspecified: Secondary | ICD-10-CM

## 2014-07-11 DIAGNOSIS — IMO0001 Reserved for inherently not codable concepts without codable children: Secondary | ICD-10-CM

## 2014-07-11 DIAGNOSIS — Z794 Long term (current) use of insulin: Secondary | ICD-10-CM

## 2014-07-11 DIAGNOSIS — E559 Vitamin D deficiency, unspecified: Secondary | ICD-10-CM

## 2014-07-11 DIAGNOSIS — I10 Essential (primary) hypertension: Secondary | ICD-10-CM

## 2014-07-11 DIAGNOSIS — Z79899 Other long term (current) drug therapy: Secondary | ICD-10-CM

## 2014-07-11 DIAGNOSIS — E119 Type 2 diabetes mellitus without complications: Secondary | ICD-10-CM

## 2014-07-11 LAB — CBC WITH DIFFERENTIAL/PLATELET
BASOS PCT: 0 % (ref 0–1)
Basophils Absolute: 0 10*3/uL (ref 0.0–0.1)
EOS ABS: 0.5 10*3/uL (ref 0.0–0.7)
Eosinophils Relative: 4 % (ref 0–5)
HCT: 39.5 % (ref 36.0–46.0)
HEMOGLOBIN: 13.3 g/dL (ref 12.0–15.0)
Lymphocytes Relative: 27 % (ref 12–46)
Lymphs Abs: 3.4 10*3/uL (ref 0.7–4.0)
MCH: 27.4 pg (ref 26.0–34.0)
MCHC: 33.7 g/dL (ref 30.0–36.0)
MCV: 81.4 fL (ref 78.0–100.0)
MONO ABS: 0.9 10*3/uL (ref 0.1–1.0)
MONOS PCT: 7 % (ref 3–12)
NEUTROS ABS: 7.8 10*3/uL — AB (ref 1.7–7.7)
NEUTROS PCT: 62 % (ref 43–77)
Platelets: 388 10*3/uL (ref 150–400)
RBC: 4.85 MIL/uL (ref 3.87–5.11)
RDW: 14.8 % (ref 11.5–15.5)
WBC: 12.6 10*3/uL — ABNORMAL HIGH (ref 4.0–10.5)

## 2014-07-11 LAB — LIPID PANEL
Cholesterol: 180 mg/dL (ref 0–200)
HDL: 44 mg/dL (ref >39)
LDL Cholesterol: 66 mg/dL (ref 0–99)
Total CHOL/HDL Ratio: 4.1 Ratio
Triglycerides: 352 mg/dL — ABNORMAL HIGH (ref <150)
VLDL: 70 mg/dL — ABNORMAL HIGH (ref 0–40)

## 2014-07-11 LAB — HEPATIC FUNCTION PANEL
ALK PHOS: 50 U/L (ref 39–117)
ALT: 14 U/L (ref 0–35)
AST: 14 U/L (ref 0–37)
Albumin: 3.9 g/dL (ref 3.5–5.2)
Total Bilirubin: 0.3 mg/dL (ref 0.2–1.2)
Total Protein: 6.6 g/dL (ref 6.0–8.3)

## 2014-07-11 LAB — MAGNESIUM: MAGNESIUM: 2 mg/dL (ref 1.5–2.5)

## 2014-07-11 LAB — BASIC METABOLIC PANEL WITH GFR
BUN: 32 mg/dL — ABNORMAL HIGH (ref 6–23)
CHLORIDE: 98 meq/L (ref 96–112)
CO2: 33 meq/L — AB (ref 19–32)
Calcium: 9.4 mg/dL (ref 8.4–10.5)
Creat: 1.09 mg/dL (ref 0.50–1.10)
GFR, EST AFRICAN AMERICAN: 55 mL/min — AB
GFR, Est Non African American: 48 mL/min — ABNORMAL LOW
GLUCOSE: 83 mg/dL (ref 70–99)
POTASSIUM: 4.8 meq/L (ref 3.5–5.3)
SODIUM: 141 meq/L (ref 135–145)

## 2014-07-11 NOTE — Patient Instructions (Signed)
  Decrease each dose of insulin  (Lantus & Humalog)   By 2 Units  For a week & if glucose stays below 160 mg%  Then   Decrease by 2 more units

## 2014-07-11 NOTE — Progress Notes (Signed)
Patient ID: Catherine Wood, female   DOB: Apr 07, 1931, 80 y.o.   MRN: [id number]   This very nice 80 y.o.female presents for 3 month follow up with Hypertension, Hyperlipidemia, Pre-Diabetes and Vitamin D Deficiency.    Patient is treated for HTN & BP has been controlled at home. Today's BP: 110/64 mmHg. Patient has had no complaints of any cardiac type chest pain, palpitations, dyspnea/orthopnea/PND, dizziness, claudication, or dependent edema.   Hyperlipidemia is controlled with diet & meds. Patient denies myalgias or other med SE's. Last Lipids were at goal with Total Chol 66; HDL 45; LDL 71; Trig 252* on 04/05/2014.   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  Last A1c was not at goal at 7.4% on 04/05/2014.    Further, the patient also has history of Vitamin D Deficiency and supplements vitamin D without any suspected side-effects. Last vitamin D was  52 on 04/05/2014.   Medication List   aspirin 81 MG tablet  Take 81 mg by mouth daily.     CALCIUM 600 PO  Take 600 mg by mouth daily.     canagliflozin 300 MG Tabs tablet  Commonly known as:  INVOKANA  Take 1 tablet (300 mg total) by mouth daily.     Cinnamon 500 MG capsule  Take 500 mg by mouth daily.     fenofibrate 160 MG tablet  TAKE 1 TABLET DAILY     ferrous fumarate 325 (106 FE) MG Tabs tablet  Commonly known as:  HEMOCYTE - 106 mg FE  Take 1 tablet by mouth daily.     Fish Oil 1000 MG Caps  Take by mouth daily.     furosemide 40 MG tablet  Commonly known as:  LASIX  TAKE 1 TO 2 TABLETS DAILY DEPENDING ON SWELLING/EDEMA     gabapentin 300 MG capsule  Commonly known as:  NEURONTIN     gatifloxacin 0.5 % Soln  Commonly known as:  ZYMAXID     ICAPS PO  Take by mouth daily.     insulin glargine 100 UNIT/ML injection  Commonly known as:  LANTUS  Inject into the skin 2  (two) times daily. 40 units BID     LANTUS SOLOSTAR 100 UNIT/ML Solostar Pen  Generic drug:  Insulin Glargine     Insulin Lispro Prot & Lispro (75-25) 100 UNIT/ML Kwikpen  Commonly known as:  HUMALOG MIX 75/25 KWIKPEN  USE AS DIRECTED. Inject into skin2 times daily with a meal. 35 units in the morning and 15 units in the evening     levothyroxine 100 MCG tablet  Commonly known as:  SYNTHROID, LEVOTHROID  Take 100 mcg by mouth daily before breakfast.     losartan 100 MG tablet  Commonly known as:  COZAAR  TAKE 1 TABLET ONCE A DAY FOR HIGH BLOOD PRESSURE     LYRICA 75 MG capsule  Generic drug:  pregabalin     Magnesium 400 MG Caps  Take by mouth 4 (four) times daily.     meloxicam 15 MG tablet  Commonly known as:  MOBIC  Take 1 tablet daily with food for pain & inflammation     metFORMIN 1000 MG tablet  Commonly known as:  GLUCOPHAGE  TAKE  (1)  TABLET TWICE A DAY WITH MEALS (BREAKFAST AND SUPPER)     OVER THE COUNTER MEDICATION  as  needed. Macular Degeneration refresh eye drops     PEN NEEDLES 29GX1/2" 29G X 12MM Misc  USE 4 TIMES A DAY     pravastatin 40 MG tablet  Commonly known as:  PRAVACHOL  TAKE 1 TABLET DAILY FOR CHOLESTEROL     prednisoLONE acetate 1 % ophthalmic suspension  Commonly known as:  PRED FORTE     PREVNAR 13 Susp injection  Generic drug:  pneumococcal 13-valent conjugate vaccine     ranitidine 300 MG capsule  Commonly known as:  ZANTAC  Take 300 mg by mouth every evening.     Vitamin D3 5000 UNITS Caps  Take 5,000 Units by mouth 3 (three) times daily.       Allergies  Allergen Reactions  . Detrol [Tolterodine]    PMHx:   Past Medical History  Diagnosis Date  . Hypertension   . Hypothyroid   . IDDM (insulin dependent diabetes mellitus)   . Hyperlipidemia   . Macular degeneration   . Varicose veins   . Leg swelling    Immunization History  Administered Date(s) Administered  . Influenza-Unspecified 06/22/2013, 06/16/2014,  06/27/2014  . Pneumococcal Conjugate-13 04/05/2014  . Pneumococcal-Unspecified 09/09/2004  . Td 09/10/2003  . Zoster 09/09/2005   Past Surgical History  Procedure Laterality Date  . Biopsy thyroid      benign thyroid nodule removal  . Varicose vein surgery    . Cataract extraction, bilateral    . Endometrial biopsy  2000   FHx:    Reviewed / unchanged  SHx:    Reviewed / unchanged  Systems Review:  Constitutional: Denies fever, chills, wt changes, headaches, insomnia, fatigue, night sweats, change in appetite. Eyes: Denies redness, blurred vision, diplopia, discharge, itchy, watery eyes.  ENT: Denies discharge, congestion, post nasal drip, epistaxis, sore throat, earache, hearing loss, dental pain, tinnitus, vertigo, sinus pain, snoring.  CV: Denies chest pain, palpitations, irregular heartbeat, syncope, dyspnea, diaphoresis, orthopnea, PND, claudication or edema. Respiratory: denies cough, dyspnea, DOE, pleurisy, hoarseness, laryngitis, wheezing.  Gastrointestinal: Denies dysphagia, odynophagia, heartburn, reflux, water brash, abdominal pain or cramps, nausea, vomiting, bloating, diarrhea, constipation, hematemesis, melena, hematochezia  or hemorrhoids. Genitourinary: Denies dysuria, frequency, urgency, nocturia, hesitancy, discharge, hematuria or flank pain. Musculoskeletal: Denies arthralgias, myalgias, stiffness, jt. swelling, pain, limping or strain/sprain.  Skin: Denies pruritus, rash, hives, warts, acne, eczema or change in skin lesion(s). Neuro: No weakness, tremor, incoordination, spasms, paresthesia or pain. Psychiatric: Denies confusion, memory loss or sensory loss. Endo: Denies change in weight, skin or hair change.  Heme/Lymph: No excessive bleeding, bruising or enlarged lymph nodes.  Exam:  BP 110/64 mmHg  Pulse 76  Temp(Src) 97.3 F (36.3 C)  Resp 16  Ht 5\' 4"  (1.626 m)  Wt 204 lb 12.8 oz (92.897 kg)  BMI 35.14 kg/m2  Appears well nourished and in no  distress. Eyes: PERRLA, EOMs, conjunctiva no swelling or erythema. Sinuses: No frontal/maxillary tenderness ENT/Mouth: EAC's clear, TM's nl w/o erythema, bulging. Nares clear w/o erythema, swelling, exudates. Oropharynx clear without erythema or exudates. Oral hygiene is good. Tongue normal, non obstructing. Hearing intact.  Neck: Supple. Thyroid nl. Car 2+/2+ without bruits, nodes or JVD. Chest: Respirations nl with BS clear & equal w/o rales, rhonchi, wheezing or stridor.  Cor: Heart sounds normal w/ regular rate and rhythm without sig. murmurs, gallops, clicks, or rubs. Peripheral pulses normal and equal  without edema.  Abdomen: Soft & bowel sounds normal. Non-tender w/o guarding, rebound, hernias, masses, or organomegaly.  Lymphatics: Unremarkable.  Musculoskeletal: Full  ROM all peripheral extremities, joint stability, 5/5 strength, and normal gait.  Skin: Warm, dry without exposed rashes, lesions or ecchymosis apparent.  Neuro: Cranial nerves intact, reflexes equal bilaterally. Sensory-motor testing grossly intact. Tendon reflexes grossly intact.  Pysch: Alert & oriented x 3.  Insight and judgement nl & appropriate. No ideations.  Assessment and Plan:  1. Hypertension - Continue monitor blood pressure at home. Continue diet/meds same.  2. Hyperlipidemia - Continue diet/meds, exercise,& lifestyle modifications. Continue monitor periodic cholesterol/liver & renal functions   3. T1_IDDM w/CKD3 - Continue diet, exercise, lifestyle modifications. Monitor appropriate labs.  4. Vitamin D Deficiency - Continue supplementation.   Recommended regular exercise, BP monitoring, weight control, and discussed med and SE's. Recommended labs to assess and monitor clinical status. Further disposition pending results of labs.   Discussed tapering her insulin to avoid hypoglycemia forcing her to eat and thus prevent weight gain.

## 2014-07-12 LAB — TSH: TSH: 1.238 u[IU]/mL (ref 0.350–4.500)

## 2014-07-12 LAB — VITAMIN D 25 HYDROXY (VIT D DEFICIENCY, FRACTURES): VIT D 25 HYDROXY: 41 ng/mL (ref 30–89)

## 2014-07-12 LAB — HEMOGLOBIN A1C
Hgb A1c MFr Bld: 8.3 % — ABNORMAL HIGH (ref <5.7)
MEAN PLASMA GLUCOSE: 192 mg/dL — AB (ref <117)

## 2014-07-14 ENCOUNTER — Other Ambulatory Visit: Payer: Self-pay | Admitting: Internal Medicine

## 2014-08-11 ENCOUNTER — Other Ambulatory Visit: Payer: Self-pay

## 2014-08-11 MED ORDER — CANAGLIFLOZIN 300 MG PO TABS: 300.0000 mg | ORAL_TABLET | Freq: Every day | ORAL | Status: DC

## 2014-08-16 ENCOUNTER — Other Ambulatory Visit: Payer: Self-pay | Admitting: *Deleted

## 2014-08-16 MED ORDER — LEVOTHYROXINE SODIUM 100 MCG PO TABS: 100.0000 ug | ORAL_TABLET | Freq: Every day | ORAL | Status: DC

## 2014-08-24 ENCOUNTER — Other Ambulatory Visit: Payer: Self-pay | Admitting: Internal Medicine

## 2014-08-24 MED ORDER — DICLOFENAC SODIUM 1 % TD GEL: 2.0000 g | Freq: Four times a day (QID) | TRANSDERMAL | Status: DC

## 2014-09-20 DIAGNOSIS — L84 Corns and callosities: Secondary | ICD-10-CM | POA: Diagnosis not present

## 2014-09-20 DIAGNOSIS — E1142 Type 2 diabetes mellitus with diabetic polyneuropathy: Secondary | ICD-10-CM | POA: Diagnosis not present

## 2014-09-20 DIAGNOSIS — B351 Tinea unguium: Secondary | ICD-10-CM | POA: Diagnosis not present

## 2014-09-22 DIAGNOSIS — H35052 Retinal neovascularization, unspecified, left eye: Secondary | ICD-10-CM | POA: Diagnosis not present

## 2014-09-22 DIAGNOSIS — H3532 Exudative age-related macular degeneration: Secondary | ICD-10-CM | POA: Diagnosis not present

## 2014-09-28 ENCOUNTER — Other Ambulatory Visit: Payer: Self-pay | Admitting: *Deleted

## 2014-09-28 MED ORDER — "PEN NEEDLES 1/2"" 29G X 12MM MISC": Status: DC

## 2014-10-04 DIAGNOSIS — H3532 Exudative age-related macular degeneration: Secondary | ICD-10-CM | POA: Diagnosis not present

## 2014-10-04 DIAGNOSIS — H35051 Retinal neovascularization, unspecified, right eye: Secondary | ICD-10-CM | POA: Diagnosis not present

## 2014-10-06 DIAGNOSIS — H3532 Exudative age-related macular degeneration: Secondary | ICD-10-CM | POA: Diagnosis not present

## 2014-10-10 ENCOUNTER — Other Ambulatory Visit: Payer: Self-pay | Admitting: Physician Assistant

## 2014-10-12 ENCOUNTER — Other Ambulatory Visit: Payer: Self-pay | Admitting: Physician Assistant

## 2014-10-20 ENCOUNTER — Ambulatory Visit (INDEPENDENT_AMBULATORY_CARE_PROVIDER_SITE_OTHER): Payer: Medicare Other | Admitting: Physician Assistant

## 2014-10-20 ENCOUNTER — Encounter: Payer: Self-pay | Admitting: Physician Assistant

## 2014-10-20 VITALS — BP 136/64 | HR 78 | Temp 98.2°F | Resp 18 | Ht 64.0 in | Wt 206.0 lb

## 2014-10-20 DIAGNOSIS — E039 Hypothyroidism, unspecified: Secondary | ICD-10-CM

## 2014-10-20 DIAGNOSIS — N183 Chronic kidney disease, stage 3 unspecified: Secondary | ICD-10-CM

## 2014-10-20 DIAGNOSIS — I8390 Asymptomatic varicose veins of unspecified lower extremity: Secondary | ICD-10-CM

## 2014-10-20 DIAGNOSIS — Z23 Encounter for immunization: Secondary | ICD-10-CM | POA: Diagnosis not present

## 2014-10-20 DIAGNOSIS — R200 Anesthesia of skin: Secondary | ICD-10-CM

## 2014-10-20 DIAGNOSIS — R6889 Other general symptoms and signs: Secondary | ICD-10-CM | POA: Diagnosis not present

## 2014-10-20 DIAGNOSIS — E785 Hyperlipidemia, unspecified: Secondary | ICD-10-CM

## 2014-10-20 DIAGNOSIS — I10 Essential (primary) hypertension: Secondary | ICD-10-CM | POA: Diagnosis not present

## 2014-10-20 DIAGNOSIS — N189 Chronic kidney disease, unspecified: Secondary | ICD-10-CM | POA: Diagnosis not present

## 2014-10-20 DIAGNOSIS — E669 Obesity, unspecified: Secondary | ICD-10-CM

## 2014-10-20 DIAGNOSIS — Z9181 History of falling: Secondary | ICD-10-CM

## 2014-10-20 DIAGNOSIS — Z0001 Encounter for general adult medical examination with abnormal findings: Secondary | ICD-10-CM | POA: Diagnosis not present

## 2014-10-20 DIAGNOSIS — R296 Repeated falls: Secondary | ICD-10-CM | POA: Diagnosis not present

## 2014-10-20 DIAGNOSIS — M5136 Other intervertebral disc degeneration, lumbar region: Secondary | ICD-10-CM | POA: Insufficient documentation

## 2014-10-20 DIAGNOSIS — E1022 Type 1 diabetes mellitus with diabetic chronic kidney disease: Secondary | ICD-10-CM | POA: Diagnosis not present

## 2014-10-20 DIAGNOSIS — Z1331 Encounter for screening for depression: Secondary | ICD-10-CM

## 2014-10-20 DIAGNOSIS — E559 Vitamin D deficiency, unspecified: Secondary | ICD-10-CM

## 2014-10-20 DIAGNOSIS — I872 Venous insufficiency (chronic) (peripheral): Secondary | ICD-10-CM

## 2014-10-20 DIAGNOSIS — I868 Varicose veins of other specified sites: Secondary | ICD-10-CM

## 2014-10-20 DIAGNOSIS — Z79899 Other long term (current) drug therapy: Secondary | ICD-10-CM

## 2014-10-20 DIAGNOSIS — H3530 Unspecified macular degeneration: Secondary | ICD-10-CM

## 2014-10-20 DIAGNOSIS — E1142 Type 2 diabetes mellitus with diabetic polyneuropathy: Secondary | ICD-10-CM

## 2014-10-20 LAB — LIPID PANEL
CHOL/HDL RATIO: 4.1 ratio
Cholesterol: 203 mg/dL — ABNORMAL HIGH (ref 0–200)
HDL: 49 mg/dL (ref >39)
LDL Cholesterol: 109 mg/dL — ABNORMAL HIGH (ref 0–99)
Triglycerides: 224 mg/dL — ABNORMAL HIGH (ref <150)
VLDL: 45 mg/dL — ABNORMAL HIGH (ref 0–40)

## 2014-10-20 LAB — CBC WITH DIFFERENTIAL/PLATELET
BASOS ABS: 0 10*3/uL (ref 0.0–0.1)
Basophils Relative: 0 % (ref 0–1)
Eosinophils Absolute: 0.4 10*3/uL (ref 0.0–0.7)
Eosinophils Relative: 4 % (ref 0–5)
HCT: 40.8 % (ref 36.0–46.0)
HEMOGLOBIN: 13.2 g/dL (ref 12.0–15.0)
LYMPHS ABS: 2.6 10*3/uL (ref 0.7–4.0)
LYMPHS PCT: 28 % (ref 12–46)
MCH: 27.7 pg (ref 26.0–34.0)
MCHC: 32.4 g/dL (ref 30.0–36.0)
MCV: 85.5 fL (ref 78.0–100.0)
MPV: 10.2 fL (ref 8.6–12.4)
Monocytes Absolute: 0.9 10*3/uL (ref 0.1–1.0)
Monocytes Relative: 10 % (ref 3–12)
NEUTROS PCT: 58 % (ref 43–77)
Neutro Abs: 5.4 10*3/uL (ref 1.7–7.7)
PLATELETS: 375 10*3/uL (ref 150–400)
RBC: 4.77 MIL/uL (ref 3.87–5.11)
RDW: 14.7 % (ref 11.5–15.5)
WBC: 9.3 10*3/uL (ref 4.0–10.5)

## 2014-10-20 LAB — BASIC METABOLIC PANEL WITH GFR
BUN: 26 mg/dL — AB (ref 6–23)
CHLORIDE: 100 meq/L (ref 96–112)
CO2: 32 meq/L (ref 19–32)
Calcium: 9.9 mg/dL (ref 8.4–10.5)
Creat: 0.96 mg/dL (ref 0.50–1.10)
GFR, Est African American: 64 mL/min
GFR, Est Non African American: 55 mL/min — ABNORMAL LOW
Glucose, Bld: 112 mg/dL — ABNORMAL HIGH (ref 70–99)
POTASSIUM: 4.3 meq/L (ref 3.5–5.3)
Sodium: 141 mEq/L (ref 135–145)

## 2014-10-20 LAB — HEPATIC FUNCTION PANEL
ALT: 20 U/L (ref 0–35)
AST: 23 U/L (ref 0–37)
Albumin: 4.1 g/dL (ref 3.5–5.2)
Alkaline Phosphatase: 33 U/L — ABNORMAL LOW (ref 39–117)
Bilirubin, Direct: 0.1 mg/dL (ref 0.0–0.3)
Indirect Bilirubin: 0.2 mg/dL (ref 0.2–1.2)
Total Bilirubin: 0.3 mg/dL (ref 0.2–1.2)
Total Protein: 6.8 g/dL (ref 6.0–8.3)

## 2014-10-20 LAB — MAGNESIUM: Magnesium: 2 mg/dL (ref 1.5–2.5)

## 2014-10-20 MED ORDER — GABAPENTIN 300 MG PO CAPS: ORAL_CAPSULE | ORAL | Status: DC

## 2014-10-20 MED ORDER — GABAPENTIN 300 MG PO CAPS: 300.0000 mg | ORAL_CAPSULE | Freq: Three times a day (TID) | ORAL | Status: DC

## 2014-10-20 NOTE — Patient Instructions (Signed)
[employer] with no obligation # [phone] Tues-Sat 10-6  [facility] with no obligation # [phone] Call for store hours  Can take the gabapentin for nerve pain. It can make you sleepy so we suggest trying it at night first and please plan to not drive or do anything strenuous. Also please do not take this medication with alcohol.  Start out 1 pill at night before bed, can increase to 2 pills at night before bed. Please call the office if you have any side effects.   Can take 3 pills a day however you would like  Some examples: - 1 breakfast, lunch, bedtime. - 1 at breakfast, 2 at bed time  How should I use this medicine? Take this medicine by mouth with a glass of water. Follow the directions on the prescription label. You can take this medicine with or without food. Take your doses at regular intervals. Do not take your medicine more often than directed. Do not stop taking except on your doctor's advice.  What if I miss a dose? If you miss a dose, take it as soon as you can. If it is almost time for your next dose, take only that dose. Do not take double or extra doses.  What should I watch for while using this medicine? Tell your doctor or healthcare professional if your symptoms do not start to get better or if they get worse.   You may get drowsy or dizzy. Do not drive, use machinery, or do anything that needs mental alertness until you know how this medicine affects you. Do not stand or sit up quickly, especially if you are an older patient. This reduces the risk of dizzy or fainting spells. Alcohol may interfere with the effect of this medicine. Avoid alcoholic drinks. If you have a heart condition, like congestive heart failure, and notice that you are retaining water and have swelling in your hands or feet, contact your health care provider immediately.  What side effects may I notice from receiving this medicine? Side effects that you  should report to your doctor or health care professional as soon as possible and are very rare: -allergic reactions like skin rash, itching or hives, swelling of the face, lips, or tongue -breathing problems -changes in vision -jerking or unusual movements of any part of your body -suicidal thoughts or other mood changes -swelling of the ankles, feet, hands -unusual bruising or bleeding  Side effects that usually do not require medical attention (Report these to your doctor or health care professional if they continue or are bothersome.): -dizziness -drowsiness -dry mouth -nausea -tremors  Diabetes is a very complicated disease...lets simplify it.  An easy way to look at it to understand the complications is if you think of the extra sugar floating in your blood stream as glass shards floating through your blood stream.    Diabetes affects your small vessels first: 1) The glass shards (sugar) scraps down the tiny blood vessels in your eyes and lead to diabetic retinopathy, the leading cause of blindness in the [place]. Diabetes is the leading cause of newly diagnosed adult (53 to 79 years of age) blindness in the [place].  2) The glass shards scratches down the tiny vessels of your legs leading to nerve damage called neuropathy and can lead to amputations of your feet. More than 60% of all non-traumatic amputations of lower limbs occur in people with diabetes.  3) Over time the  small vessels in your brain are shredded and closed off, individually this does not cause any problems but over a long period of time many of the small vessels being blocked can lead to Vascular Dementia.   4) Your kidney's are a filter system and have a "net" that keeps certain things in the body and lets bad things out. Sugar shreds this net and leads to kidney damage and eventually failure. Decreasing the sugar that is destroying the net and certain blood pressure medications can help stop or decrease  progression of kidney disease. Diabetes was the primary cause of kidney failure in 44 percent of all new cases in 2011.  5) Diabetes also destroys the small vessels in your penis that lead to erectile dysfunction. Eventually the vessels are so damaged that you may not be responsive to cialis or viagra.   Diabetes and your large vessels: Your larger vessels consist of your coronary arteries in your heart and the carotid vessels to your brain. Diabetes or even increased sugars put you at 300% increased risk of heart attack and stroke and this is why.. The sugar scrapes down your large blood vessels and your body sees this as an internal injury and tries to repair itself. Just like you get a scab on your skin, your platelets will stick to the blood vessel wall trying to heal it. This is why we have diabetics on low dose aspirin daily, this prevents the platelets from sticking and can prevent plaque formation. In addition, your body takes cholesterol and tries to shove it into the open wound. This is why we want your LDL, or bad cholesterol, below 70.   The combination of platelets and cholesterol over 5-10 years forms plaque that can break off and cause a heart attack or stroke.   PLEASE REMEMBER:  Diabetes is preventable! Up to 68 percent of complications and morbidities among individuals with type 2 diabetes can be prevented, delayed, or effectively treated and minimized with regular visits to a health professional, appropriate monitoring and medication, and a healthy diet and lifestyle.

## 2014-10-20 NOTE — Progress Notes (Signed)
MEDICARE ANNUAL WELLNESS VISIT AND FOLLOW UP  Assessment:   1. Essential hypertension - continue medications, DASH diet, exercise and monitor at home. Call if greater than 130/80.  - CBC with Differential/Platelet - BASIC METABOLIC PANEL WITH GFR - Hepatic function panel  2. Varicose veins Varicose veins- weight loss discussed, continue compression stockings and elevation  3. Chronic venous insufficiency weight loss discussed, continue compression stockings and elevation  4. Hypothyroidism, unspecified hypothyroidism type - TSH  5. Type 1 diabetes mellitus with diabetic chronic kidney disease Discussed general issues about diabetes pathophysiology and management., Educational material distributed., Suggested low cholesterol diet., Encouraged aerobic exercise., Discussed foot care., Reminded to get yearly retinal exam. - Hemoglobin A1c  6. Hyperlipidemia -continue medications, check lipids, decrease fatty foods, increase activity.  - Lipid panel  7. Macular degeneration Cont follow up eye  8. Vitamin D deficiency - Vit D  25 hydroxy (rtn osteoporosis monitoring)  9. Medication management - Magnesium  10. Obesity Obesity with co morbidities- long discussion about weight loss, diet, and exercise  11. DDD (degenerative disc disease), lumbar RICE, NSAIDS, exercises given, if not better get xray and PT referral or ortho referral.   12. CKD (chronic kidney disease) stage 3, GFR 30-59 ml/min Increase fluids, avoid NSAIDS, monitor sugars, will monitor  13. Left arm numbness ? From neck, normal neuro, very high risk for MI/stroke due to DM but EKG is normal, continue bASA, patient declines referral to cardio at this. Call Dr. Nelva Bush for possible injection  Go to the ER if any CP, SOB, nausea, dizziness, severe HA, changes vision/speech - EKG 12-Lead  14. Need for prophylactic vaccination with combined diphtheria-tetanus-pertussis (DTP) vaccine - Dt vaccine greater than 7yo  IM  15. DM polyneuropathy will increase gabapentin 2 bed time, 2 at lunch, and 1 at breakfast, decrease sugars   OVER 40 minutes of exam, counseling, chart review, referral performed   Plan:   During the course of the visit the patient was educated and counseled about appropriate screening and preventive services including:    Pneumococcal vaccine   Influenza vaccine  Td vaccine  Screening electrocardiogram  Screening mammography  Bone densitometry screening  Colorectal cancer screening  Diabetes screening  Glaucoma screening  Nutrition counseling   Advanced directives: given info/requested  Screening recommendations, referrals: Vaccinations: See below  Nutrition assessed and recommended  Colonoscopy due 2016 Mammogram due 2016 Pap smear not indicated Pelvic exam not indicated Recommended yearly ophthalmology/optometry visit for glaucoma screening and checkup Recommended yearly dental visit for hygiene and checkup Advanced directives - requested, but declines information  Conditions/risks identified: BMI: Discussed weight loss, diet, and increase physical activity.  Increase physical activity: AHA recommends 150 minutes of physical activity a week.  Medications reviewed DEXA- requested Diabetes is not at goal, ACE/ARB therapy: Yes. Urinary Incontinence is not an issue: discussed non pharmacology and pharmacology options.  Fall risk: moderate- discussed PT, home fall assessment, medications.    Subjective:   Catherine Wood is a 81 y.o. female who presents for Medicare Annual Wellness Visit and 3 month follow up on hypertension, diabetes, hyperlipidemia, vitamin D def.  Date of last medicare wellness was on 04/05/2014  Her blood pressure has been controlled at home, today their BP is BP: 136/64 mmHg She does not workout. She denies chest pain, shortness of breath, dizziness.  She is right handed, complaining of numbness/tingling in her left arm for 2-3  weeks, no injury, better with lying down, she was having severe neck pain  on her right side. She admits to being very inactive, will go up and down stairs to do laundry, she had more fatigue when she got to the top. Denies CP, SOB, nausea, diaphoresis. She has had worsening balance, using cane to walk. Denies changes in speech/vision in the past month.  She is on cholesterol medication and denies myalgias. Her cholesterol is at goal. The cholesterol last visit was:   Lab Results  Component Value Date   CHOL 180 07/11/2014   HDL 44 07/11/2014   LDLCALC 66 07/11/2014   TRIG 352* 07/11/2014   CHOLHDL 4.1 07/11/2014   She has been working on diet and exercise for diabetes, she has Stage 3 CKD due to DM and complains of worsening bilateral feet paresthesias she is on lantus 36 and 36, humolog mix is 31 and 31 units, she is on bASA , she is on ARB and denies hypoglycemia , polydipsia and polyuria. She denies hypoglycemia, and sugars are running 112-140's. Last A1C in the office was:  Lab Results  Component Value Date   HGBA1C 8.3* 07/11/2014   Patient is on Vitamin D supplement. Lab Results  Component Value Date   VD25OH 41 07/11/2014     She is on thyroid medication. Her medication was not changed last visit. Patient denies nervousness, palpitations and weight changes.  Lab Results  Component Value Date   TSH 1.238 07/11/2014   Follows with Dr. Nelva Bush for lower back pain/ and gets injections with him in her left hip.  She BMI is Body mass index is 35.34 kg/(m^2)., she is working on diet and exercise and has done well.  Wt Readings from Last 3 Encounters:  10/20/14 206 lb (93.441 kg)  07/11/14 204 lb 12.8 oz (92.897 kg)  07/04/14 204 lb (92.534 kg)    Names of Other Physician/Practitioners you currently use: 1. [facility]- here for primary care 2. None, dentist, last visit 3 years Patient Care Team: [name], MD as PCP - General (Internal  Medicine) Jeryl Columbia, MD as Consulting Physician (Gastroenterology) Lelon Perla, MD as Consulting Physician (Cardiology) Johna Sheriff, MD as Consulting Physician (Ophthalmology)- has appointment august Hurman Horn, MD as Consulting Physician (Ophthalmology) Deneise Lever, MD as Consulting Physician (Pulmonary Disease) Nelva Bush- gets left hip injections.   Medication Review Current Outpatient Prescriptions on File Prior to Visit  Medication Sig Dispense Refill  . AFLURIA PRESERVATIVE FREE 0.5 ML SUSY     . aspirin 81 MG tablet Take 81 mg by mouth daily.    . Calcium Carbonate (CALCIUM 600 PO) Take 600 mg by mouth daily.    . canagliflozin (INVOKANA) 300 MG TABS tablet Take 300 mg by mouth daily before breakfast. 30 tablet 6  . Cholecalciferol (VITAMIN D3) 5000 UNITS CAPS Take 5,000 Units by mouth 3 (three) times daily.     . Cinnamon 500 MG capsule Take 500 mg by mouth daily.    . diclofenac sodium (VOLTAREN) 1 % GEL Apply 2 g topically 4 (four) times daily. 100 g 99  . fenofibrate 160 MG tablet TAKE 1 TABLET DAILY 30 tablet PRN  . ferrous fumarate (HEMOCYTE - 106 MG FE) 325 (106 FE) MG TABS tablet Take 1 tablet by mouth daily.    . furosemide (LASIX) 40 MG tablet TAKE 1 TO 2 TABLETS DAILY DEPENDING ON SWELLING/EDEMA 60 tablet 3  . gabapentin (NEURONTIN) 300 MG capsule     . gatifloxacin (ZYMAXID) 0.5 % SOLN     .  HUMALOG MIX 75/25 KWIKPEN (75-25) 100 UNIT/ML Kwikpen INJECT 35 UNITS SQ EVERY MORNING AND 15 TO 20 UNITS EVERY EVENING AS DIRECTED 15 mL 1  . insulin glargine (LANTUS) 100 UNIT/ML injection Inject into the skin 2 (two) times daily. 40 units BID    . Insulin Pen Needle (PEN NEEDLES 29GX1/2") 29G X 12MM MISC USE 4 TIMES A DAY 100 each 6  . LANTUS SOLOSTAR 100 UNIT/ML Solostar Pen 50 UNITS SUB-Q TWO TIMES A DAY 15 mL PRN  . levothyroxine (SYNTHROID, LEVOTHROID) 100 MCG tablet Take 1 tablet (100 mcg total) by mouth daily before breakfast. 90 tablet 1  . losartan (COZAAR)  100 MG tablet TAKE 1 TABLET ONCE A DAY FOR HIGH BLOOD PRESSURE 30 tablet 3  . Magnesium 400 MG CAPS Take by mouth 4 (four) times daily.    . meloxicam (MOBIC) 15 MG tablet Take 1 tablet daily with food for pain & inflammation 90 tablet 99  . metFORMIN (GLUCOPHAGE) 1000 MG tablet TAKE (1) TABLET TWICE A DAY WITH MEALS (BREAKFAST AND SUPPER) 60 tablet 2  . Multiple Vitamins-Minerals (ICAPS PO) Take by mouth daily.    . Omega-3 Fatty Acids (FISH OIL) 1000 MG CAPS Take by mouth daily.    Marland Kitchen OVER THE COUNTER MEDICATION as needed. Macular Degeneration refresh eye drops    . pravastatin (PRAVACHOL) 40 MG tablet TAKE 1 TABLET DAILY FOR CHOLESTEROL 90 tablet PRN  . prednisoLONE acetate (PRED FORTE) 1 % ophthalmic suspension     . ranitidine (ZANTAC) 300 MG capsule Take 300 mg by mouth every evening.     No current facility-administered medications on file prior to visit.    Current Problems (verified) Patient Active Problem List   Diagnosis Date Noted  . Varicose veins of lower extremities with other complications 61/60/7371  . Chronic venous insufficiency 12/31/2013  . Vitamin D deficiency 12/30/2013  . Medication management 12/30/2013  . Hypertension   . Hypothyroid   . T1_IDDM w/ Stage 3 CKD (GFR 47 ml/min)   . Hyperlipidemia   . Macular degeneration     Screening Tests Health Maintenance  Topic Date Due  . OPHTHALMOLOGY EXAM  09/12/1940  . COLONOSCOPY  09/12/1982  . TETANUS/TDAP  09/09/2013  . URINE MICROALBUMIN  12/31/2014  . HEMOGLOBIN A1C  01/09/2015  . FOOT EXAM  04/06/2015  . INFLUENZA VACCINE  04/10/2015  . DEXA SCAN  Completed  . PNEUMOCOCCAL POLYSACCHARIDE VACCINE AGE 67 AND OVER  Completed  . ZOSTAVAX  Completed     Immunization History  Administered Date(s) Administered  . Influenza-Unspecified 06/22/2013, 06/16/2014, 06/27/2014  . Pneumococcal Conjugate-13 04/05/2014  . Pneumococcal-Unspecified 09/09/2004  . Td 09/10/2003  . Zoster 09/09/2005    Preventative  care: Last colonoscopy: 2011 due 2016 Last mammogram: 02/2014 Last pap smear/pelvic exam: remote  DEXA: 2013  Prior vaccinations: TD or Tdap: 2005 DUE this year Influenza: 2015 Pneumococcal: 2006  Prevnar 13 2015 Shingles/Zostavax: 2007  History reviewed: allergies, current medications, past family history, past medical history, past social history, past surgical history and problem list  Risk Factors: Osteoporosis: postmenopausal estrogen deficiency and dietary calcium and/or vitamin D deficiency History of fracture in the past year: no  Tobacco History  Substance Use Topics  . Smoking status: Never Smoker   . Smokeless tobacco: Not on file  . Alcohol Use: No   She does not smoke.  Patient is not a former smoker. Are there smokers in your home (other than you)?  No  Alcohol Current alcohol use:  none  Caffeine Current caffeine use: coffee 2 /day  Exercise Current exercise: none  Nutrition/Diet Current diet: in general, a "healthy" diet    Cardiac risk factors: advanced age (older than 2 for men, 89 for women), diabetes mellitus, dyslipidemia, hypertension, obesity (BMI >= 30 kg/m2) and sedentary lifestyle.  Depression Screen (Note: if answer to either of the following is "Yes", a more complete depression screening is indicated)   Q1: Over the past two weeks, have you felt down, depressed or hopeless? No  Q2: Over the past two weeks, have you felt little interest or pleasure in doing things? No  Have you lost interest or pleasure in daily life? No  Do you often feel hopeless? No  Do you cry easily over simple problems? No  Activities of Daily Living In your present state of health, do you have any difficulty performing the following activities?:  Driving? No Managing money?  No Feeding yourself? No Getting from bed to chair? No Climbing a flight of stairs? No Preparing food and eating?: No Bathing or showering? No Getting dressed: No Getting to the  toilet? No Using the toilet:No Moving around from place to place: No In the past year have you fallen or had a near fall?:No   Are you sexually active?  No  Do you have more than one partner?  No  Vision Difficulties: No  Hearing Difficulties: Yes per kids Do you often ask people to speak up or repeat themselves? Yes Do you experience ringing or noises in your ears? No Do you have difficulty understanding soft or whispered voices? Yes  Cognition  Do you feel that you have a problem with memory?No  Do you often misplace items? No  Do you feel safe at home?  Yes  Advanced directives Does patient have a Reeder? No Does patient have a Living Will? No   Objective:   Blood pressure 136/64, pulse 78, temperature 98.2 F (36.8 C), temperature source Temporal, resp. rate 18, height 5\' 4"  (1.626 m), weight 206 lb (93.441 kg). Body mass index is 35.34 kg/(m^2).  General appearance: alert, no distress, WD/WN,  female Cognitive Testing  Alert? Yes  Normal Appearance?Yes  Oriented to person? Yes  Place? Yes   Time? Yes  Recall of three objects?  Yes  Can perform simple calculations? Yes  Displays appropriate judgment?Yes  Can read the correct time from a watch face?Yes  HEENT: normocephalic, sclerae anicteric, TMs pearly, nares patent, no discharge or erythema, pharynx normal Oral cavity: MMM, no lesions Neck: supple, no lymphadenopathy, no thyromegaly, no masses Heart: RRR, normal S1, S2, no murmurs, occ PVC Lungs: CTA bilaterally, no wheezes, rhonchi, or rales Abdomen: +bs, soft, obese, non tender, non distended, no masses, no hepatomegaly, no splenomegaly Musculoskeletal: nontender, no swelling, no obvious deformity Extremities: no edema, no cyanosis, no clubbing Pulses: 2+ symmetric, upper and lower extremities, normal cap refill Neurological: alert, oriented x 3, CN2-12 intact, strength normal upper extremities and lower extremities, sensation  decreased bilateral legs to mid shin, DTRs 2+ throughout, no cerebellar signs, gait normal, left arm with good ROM, good sensation, good pulses. Neck with decreased ROM due to pain, and + paraspinus tenderness bilateral, negative tenderness palpation at spinous process.  Psychiatric: normal affect, behavior normal, pleasant  Breast: defer Gyn: defer Rectal: defer   Medicare Attestation I have personally reviewed: The patient's medical and social history Their use of alcohol, tobacco or illicit drugs Their current medications and supplements The patient's functional  ability including ADLs,fall risks, home safety risks, cognitive, and hearing and visual impairment Diet and physical activities Evidence for depression or mood disorders  The patient's weight, height, BMI, and visual acuity have been recorded in the chart.  I have made referrals, counseling, and provided education to the patient based on review of the above and I have provided the patient with a written personalized care plan for preventive services.     Vicie Mutters, PA-C   10/20/2014

## 2014-10-21 LAB — HEMOGLOBIN A1C
Hgb A1c MFr Bld: 7.9 % — ABNORMAL HIGH (ref <5.7)
Mean Plasma Glucose: 180 mg/dL — ABNORMAL HIGH (ref <117)

## 2014-10-21 LAB — TSH: TSH: 1.949 u[IU]/mL (ref 0.350–4.500)

## 2014-10-21 LAB — VITAMIN D 25 HYDROXY (VIT D DEFICIENCY, FRACTURES): Vit D, 25-Hydroxy: 55 ng/mL (ref 30–100)

## 2014-10-25 ENCOUNTER — Emergency Department (HOSPITAL_COMMUNITY): Payer: Medicare Other

## 2014-10-25 ENCOUNTER — Encounter (HOSPITAL_COMMUNITY): Payer: Self-pay | Admitting: Emergency Medicine

## 2014-10-25 ENCOUNTER — Emergency Department (HOSPITAL_COMMUNITY)
Admission: EM | Admit: 2014-10-25 | Discharge: 2014-10-25 | Disposition: A | Discharge status: Home / Self Care | Payer: Medicare Other | Attending: Emergency Medicine | Admitting: Emergency Medicine

## 2014-10-25 DIAGNOSIS — R531 Weakness: Secondary | ICD-10-CM | POA: Diagnosis not present

## 2014-10-25 DIAGNOSIS — E039 Hypothyroidism, unspecified: Secondary | ICD-10-CM | POA: Insufficient documentation

## 2014-10-25 DIAGNOSIS — Z791 Long term (current) use of non-steroidal anti-inflammatories (NSAID): Secondary | ICD-10-CM | POA: Insufficient documentation

## 2014-10-25 DIAGNOSIS — I517 Cardiomegaly: Secondary | ICD-10-CM | POA: Diagnosis not present

## 2014-10-25 DIAGNOSIS — M5010 Cervical disc disorder with radiculopathy, unspecified cervical region: Secondary | ICD-10-CM | POA: Diagnosis not present

## 2014-10-25 DIAGNOSIS — Z794 Long term (current) use of insulin: Secondary | ICD-10-CM | POA: Diagnosis not present

## 2014-10-25 DIAGNOSIS — Z7952 Long term (current) use of systemic steroids: Secondary | ICD-10-CM | POA: Insufficient documentation

## 2014-10-25 DIAGNOSIS — Z79899 Other long term (current) drug therapy: Secondary | ICD-10-CM | POA: Diagnosis not present

## 2014-10-25 DIAGNOSIS — J811 Chronic pulmonary edema: Secondary | ICD-10-CM | POA: Diagnosis not present

## 2014-10-25 DIAGNOSIS — Z7982 Long term (current) use of aspirin: Secondary | ICD-10-CM | POA: Insufficient documentation

## 2014-10-25 DIAGNOSIS — M5412 Radiculopathy, cervical region: Secondary | ICD-10-CM

## 2014-10-25 DIAGNOSIS — E785 Hyperlipidemia, unspecified: Secondary | ICD-10-CM | POA: Insufficient documentation

## 2014-10-25 DIAGNOSIS — I10 Essential (primary) hypertension: Secondary | ICD-10-CM | POA: Insufficient documentation

## 2014-10-25 DIAGNOSIS — H3530 Unspecified macular degeneration: Secondary | ICD-10-CM | POA: Insufficient documentation

## 2014-10-25 DIAGNOSIS — M5020 Other cervical disc displacement, unspecified cervical region: Secondary | ICD-10-CM | POA: Diagnosis not present

## 2014-10-25 DIAGNOSIS — M4802 Spinal stenosis, cervical region: Secondary | ICD-10-CM | POA: Diagnosis not present

## 2014-10-25 DIAGNOSIS — R200 Anesthesia of skin: Secondary | ICD-10-CM | POA: Diagnosis not present

## 2014-10-25 DIAGNOSIS — J9811 Atelectasis: Secondary | ICD-10-CM | POA: Diagnosis not present

## 2014-10-25 LAB — COMPREHENSIVE METABOLIC PANEL
ALT: 22 U/L (ref 0–35)
AST: 27 U/L (ref 0–37)
Albumin: 3.8 g/dL (ref 3.5–5.2)
Alkaline Phosphatase: 36 U/L — ABNORMAL LOW (ref 39–117)
Anion gap: 14 (ref 5–15)
BUN: 20 mg/dL (ref 6–23)
CO2: 29 mmol/L (ref 19–32)
CREATININE: 1.14 mg/dL — AB (ref 0.50–1.10)
Calcium: 9.4 mg/dL (ref 8.4–10.5)
Chloride: 96 mmol/L (ref 96–112)
GFR, EST AFRICAN AMERICAN: 50 mL/min — AB (ref >90)
GFR, EST NON AFRICAN AMERICAN: 44 mL/min — AB (ref >90)
GLUCOSE: 166 mg/dL — AB (ref 70–99)
Potassium: 4.1 mmol/L (ref 3.5–5.1)
Sodium: 139 mmol/L (ref 135–145)
Total Bilirubin: 0.3 mg/dL (ref 0.3–1.2)
Total Protein: 6.7 g/dL (ref 6.0–8.3)

## 2014-10-25 LAB — CBC
HCT: 41.5 % (ref 36.0–46.0)
Hemoglobin: 13 g/dL (ref 12.0–15.0)
MCH: 27.3 pg (ref 26.0–34.0)
MCHC: 31.3 g/dL (ref 30.0–36.0)
MCV: 87.2 fL (ref 78.0–100.0)
Platelets: 353 10*3/uL (ref 150–400)
RBC: 4.76 MIL/uL (ref 3.87–5.11)
RDW: 14.8 % (ref 11.5–15.5)
WBC: 9.5 10*3/uL (ref 4.0–10.5)

## 2014-10-25 LAB — I-STAT TROPONIN, ED: Troponin i, poc: 0 ng/mL (ref 0.00–0.08)

## 2014-10-25 MED ORDER — PREDNISONE 10 MG PO TABS: 20.0000 mg | ORAL_TABLET | Freq: Two times a day (BID) | ORAL | Status: DC

## 2014-10-25 NOTE — ED Provider Notes (Signed)
CSN: [id number]     Arrival date & time 10/25/14  1154 History   First MD Initiated Contact with Patient 10/25/14 1552     Chief Complaint  Patient presents with  . Numbness     (Consider location/radiation/quality/duration/timing/severity/associated sxs/prior Treatment) HPI Comments: Patient is an 81 year old female with past history of hypertension, diabetes. She presents with complaints of neck pain for the past several weeks. Over the last 10 days, she is developed numbness and weakness to her left arm. She is having a difficult time holding objects. She denies any involvement of her face or leg. She denies any specific injury or trauma. She's never had an issue such as this before.  Patient is a 81 y.o. female presenting with neck injury. The history is provided by the patient.  Neck Injury This is a new problem. Episode onset: 3 weeks ago. The problem occurs constantly. The problem has been gradually worsening. Pertinent negatives include no chest pain, no headaches and no shortness of breath. Nothing aggravates the symptoms. Nothing relieves the symptoms. She has tried nothing for the symptoms. The treatment provided no relief.    Past Medical History  Diagnosis Date  . Hypertension   . Hypothyroid   . IDDM (insulin dependent diabetes mellitus)   . Hyperlipidemia   . Macular degeneration   . Varicose veins   . Leg swelling    Past Surgical History  Procedure Laterality Date  . Biopsy thyroid      benign thyroid nodule removal  . Varicose vein surgery    . Cataract extraction, bilateral    . Endometrial biopsy  2000   Family History  Problem Relation Age of Onset  . Cancer Mother     colon  . Cancer Father     GI   History  Substance Use Topics  . Smoking status: Never Smoker   . Smokeless tobacco: Not on file  . Alcohol Use: No   OB History    No data available     Review of Systems  Respiratory: Negative for shortness of breath.   Cardiovascular: Negative  for chest pain.  Neurological: Negative for headaches.  All other systems reviewed and are negative.     Allergies  Detrol  Home Medications   Prior to Admission medications   Medication Sig Start Date End Date Taking? Authorizing Provider  AFLURIA PRESERVATIVE FREE 0.5 ML SUSY  06/15/14   Historical Provider, MD  aspirin 81 MG tablet Take 81 mg by mouth daily.    Historical Provider, MD  Calcium Carbonate (CALCIUM 600 PO) Take 600 mg by mouth daily.    Historical Provider, MD  canagliflozin (INVOKANA) 300 MG TABS tablet Take 300 mg by mouth daily before breakfast. 08/11/14   [name], MD  Cholecalciferol (VITAMIN D3) 5000 UNITS CAPS Take 5,000 Units by mouth 3 (three) times daily.     Historical Provider, MD  Cinnamon 500 MG capsule Take 500 mg by mouth daily.    Historical Provider, MD  diclofenac sodium (VOLTAREN) 1 % GEL Apply 2 g topically 4 (four) times daily. 08/24/14 08/25/15  [name], MD  fenofibrate 160 MG tablet TAKE 1 TABLET DAILY 07/11/14   [name], MD  ferrous fumarate (HEMOCYTE - 106 MG FE) 325 (106 FE) MG TABS tablet Take 1 tablet by mouth daily.    Historical Provider, MD  furosemide (LASIX) 40 MG tablet TAKE 1 TO 2 TABLETS DAILY DEPENDING ON SWELLING/EDEMA 05/04/14   [name], MD  gabapentin (NEURONTIN)  300 MG capsule 1 in the AM, 2 in the afternoon, and 2 at bed time 10/20/14   Vicie Mutters, PA-C  gatifloxacin (ZYMAXID) 0.5 % SOLN  01/24/14   Historical Provider, MD  HUMALOG MIX 75/25 KWIKPEN (75-25) 100 UNIT/ML Kwikpen INJECT 35 UNITS SQ EVERY MORNING AND 15 TO 20 UNITS EVERY EVENING AS DIRECTED 10/10/14   Vicie Mutters, PA-C  insulin glargine (LANTUS) 100 UNIT/ML injection Inject into the skin 2 (two) times daily. 40 units BID    Historical Provider, MD  Insulin Pen Needle (PEN NEEDLES 29GX1/2") 29G X 12MM MISC USE 4 TIMES A DAY 09/28/14   [name], MD  LANTUS SOLOSTAR 100 UNIT/ML Solostar Pen 50 UNITS SUB-Q TWO TIMES A DAY 07/15/14    [name], MD  levothyroxine (SYNTHROID, LEVOTHROID) 100 MCG tablet Take 1 tablet (100 mcg total) by mouth daily before breakfast. 08/16/14   [name], MD  losartan (COZAAR) 100 MG tablet TAKE 1 TABLET ONCE A DAY FOR HIGH BLOOD PRESSURE 04/11/14   Vicie Mutters, PA-C  Magnesium 400 MG CAPS Take by mouth 4 (four) times daily.    Historical Provider, MD  meloxicam (MOBIC) 15 MG tablet Take 1 tablet daily with food for pain & inflammation 07/04/14   [name], MD  metFORMIN (GLUCOPHAGE) 1000 MG tablet TAKE (1) TABLET TWICE A DAY WITH MEALS (BREAKFAST AND SUPPER) 10/12/14   [name], MD  Multiple Vitamins-Minerals (ICAPS PO) Take by mouth daily.    Historical Provider, MD  Omega-3 Fatty Acids (FISH OIL) 1000 MG CAPS Take by mouth daily.    Historical Provider, MD  OVER THE COUNTER MEDICATION as needed. Macular Degeneration refresh eye drops    Historical Provider, MD  pravastatin (PRAVACHOL) 40 MG tablet TAKE 1 TABLET DAILY FOR CHOLESTEROL 03/07/14   Vicie Mutters, PA-C  prednisoLONE acetate (PRED FORTE) 1 % ophthalmic suspension  01/24/14   Historical Provider, MD  ranitidine (ZANTAC) 300 MG capsule Take 300 mg by mouth every evening.    Historical Provider, MD   BP 147/59 mmHg  Pulse 86  Temp(Src) 97.9 F (36.6 C) (Oral)  Resp 16  Ht 5\' 4"  (1.626 m)  Wt 206 lb (93.441 kg)  BMI 35.34 kg/m2  SpO2 96% Physical Exam  Constitutional: She is oriented to person, place, and time. She appears well-developed and well-nourished. No distress.  HENT:  Head: Normocephalic and atraumatic.  Neck: Normal range of motion. Neck supple.  Cardiovascular: Normal rate and regular rhythm.  Exam reveals no gallop and no friction rub.   No murmur heard. Pulmonary/Chest: Effort normal and breath sounds normal. No respiratory distress. She has no wheezes.  Abdominal: Soft. Bowel sounds are normal. She exhibits no distension. There is no tenderness.  Musculoskeletal: Normal range of motion.   Neurological: She is alert and oriented to person, place, and time. No cranial nerve deficit. She exhibits normal muscle tone. Coordination normal.  Skin: Skin is warm and dry. She is not diaphoretic.  Nursing note and vitals reviewed.   ED Course  Procedures (including critical care time) Labs Review Labs Reviewed  COMPREHENSIVE METABOLIC PANEL - Abnormal; Notable for the following:    Glucose, Bld 166 (*)    Creatinine, Ser 1.14 (*)    Alkaline Phosphatase 36 (*)    GFR calc non Af Amer 44 (*)    GFR calc Af Amer 50 (*)    All other components within normal limits  CBC  I-STAT TROPOININ, ED    Imaging Review Dg Chest  2 View  10/25/2014   CLINICAL DATA:  Right neck pain and left upper extremity paresthesias for 4 weeks. Hypertension and diabetes.  EXAM: CHEST  2 VIEW  COMPARISON:  None available  FINDINGS: Mild cardiomegaly with vascular congestion and streaky basilar atelectasis. Right hemidiaphragm is elevated. No superimposed edema, focal pneumonia, collapse or consolidation. No effusion or pneumothorax. Trachea midline. Atherosclerosis of the aorta evident. Degenerative changes of the spine.  IMPRESSION: Cardiomegaly with vascular congestion  Basilar atelectasis  Elevated right hemidiaphragm.   Electronically Signed   By: Jerilynn Mages.  Shick M.D.   On: 10/25/2014 14:17     EKG Interpretation   Date/Time:  Tuesday October 25 2014 12:05:07 EST Ventricular Rate:  81 PR Interval:  200 QRS Duration: 82 QT Interval:  362 QTC Calculation: 420 R Axis:   -114 Text Interpretation:  Normal sinus rhythm with sinus arrhythmia Right  superior axis deviation Abnormal ECG Confirmed by DELOS  MD, Saagar Tortorella  ([id number]) on 10/25/2014 4:07:51 PM      MDM   Final diagnoses:  Cervical radicular pain    Patient is an 81 year old female who presents with complaints of left arm weakness and numbness and neck pain for the past several weeks. This has started in the absence of any injury or trauma. Due  to the nature of her symptoms, an MRI was obtained of the cervical spine. 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 I've discussed this finding with Dr. Kathyrn Sheriff from neurosurgery. He is recommending a steroid Dosepak and follow-up with him in the office tomorrow to discuss surgical options.    Veryl Speak, MD 10/25/14 [phone]

## 2014-10-25 NOTE — Discharge Instructions (Signed)
Prednisone as prescribed.  Follow-up with Dr. Kathyrn Sheriff tomorrow at 3:00 in his office. The address and contact information has been provided in this discharge summary.   Cervical Radiculopathy Cervical radiculopathy happens when a nerve in the neck is pinched or bruised by a slipped (herniated) disk or by arthritic changes in the bones of the cervical spine. This can occur due to an injury or as part of the normal aging process. Pressure on the cervical nerves can cause pain or numbness that runs from your neck all the way down into your arm and fingers. CAUSES  There are many possible causes, including:  Injury.  Muscle tightness in the neck from overuse.  Swollen, painful joints (arthritis).  Breakdown or degeneration in the bones and joints of the spine (spondylosis) due to aging.  Bone spurs that may develop near the cervical nerves. SYMPTOMS  Symptoms include pain, weakness, or numbness in the affected arm and hand. Pain can be severe or irritating. Symptoms may be worse when extending or turning the neck. DIAGNOSIS  Your caregiver will ask about your symptoms and do a physical exam. He or she may test your strength and reflexes. X-rays, CT scans, and MRI scans may be needed in cases of injury or if the symptoms do not go away after a period of time. Electromyography (EMG) or nerve conduction testing may be done to study how your nerves and muscles are working. TREATMENT  Your caregiver may recommend certain exercises to help relieve your symptoms. Cervical radiculopathy can, and often does, get better with time and treatment. If your problems continue, treatment options may include:  Wearing a soft collar for short periods of time.  Physical therapy to strengthen the neck muscles.  Medicines, such as nonsteroidal anti-inflammatory drugs (NSAIDs), oral corticosteroids, or spinal injections.  Surgery. Different types of surgery may be done depending on the cause of your  problems. HOME CARE INSTRUCTIONS   Put ice on the affected area.  Put ice in a plastic bag.  Place a towel between your skin and the bag.  Leave the ice on for 15-20 minutes, 03-04 times a day or as directed by your caregiver.  If ice does not help, you can try using heat. Take a warm shower or bath, or use a hot water bottle as directed by your caregiver.  You may try a gentle neck and shoulder massage.  Use a flat pillow when you sleep.  Only take over-the-counter or prescription medicines for pain, discomfort, or fever as directed by your caregiver.  If physical therapy was prescribed, follow your caregiver's directions.  If a soft collar was prescribed, use it as directed. SEEK IMMEDIATE MEDICAL CARE IF:   Your pain gets much worse and cannot be controlled with medicines.  You have weakness or numbness in your hand, arm, face, or leg.  You have a high fever or a stiff, rigid neck.  You lose bowel or bladder control (incontinence).  You have trouble with walking, balance, or speaking. MAKE SURE YOU:   Understand these instructions.  Will watch your condition.  Will get help right away if you are not doing well or get worse. Document Released: 05/21/2001 Document Revised: 11/18/2011 Document Reviewed: 04/09/2011 [facility] Patient Information 2015 [place], [place]. This information is not intended to replace advice given to you by your health care provider. Make sure you discuss any questions you have with your health care provider.

## 2014-10-25 NOTE — ED Notes (Signed)
Pt A&OX4, NAD noted. Pt denies any pain on discharge. Pt given discharge instructions. Pt discharged home in wheelchair with family.

## 2014-10-25 NOTE — ED Notes (Signed)
Patient transported to MRI 

## 2014-10-25 NOTE — ED Notes (Signed)
Pt reports numbness in left hand being present for 3-4 weeks.

## 2014-10-25 NOTE — ED Notes (Addendum)
Pt presents with right sided neck pain that started 4 weeks ago- reports 2 days afterwards she began to notice left arm numbness and tingling- denies CP or SOB.  Admits that symptoms have not resolving, last saw PCP last Thursday.

## 2014-10-26 DIAGNOSIS — M4712 Other spondylosis with myelopathy, cervical region: Secondary | ICD-10-CM | POA: Diagnosis not present

## 2014-10-26 DIAGNOSIS — Z6835 Body mass index (BMI) 35.0-35.9, adult: Secondary | ICD-10-CM | POA: Diagnosis not present

## 2014-10-26 DIAGNOSIS — I10 Essential (primary) hypertension: Secondary | ICD-10-CM | POA: Diagnosis not present

## 2014-11-07 ENCOUNTER — Other Ambulatory Visit: Payer: Self-pay | Admitting: Physician Assistant

## 2014-11-07 ENCOUNTER — Encounter (HOSPITAL_COMMUNITY): Payer: Self-pay | Admitting: *Deleted

## 2014-11-07 ENCOUNTER — Other Ambulatory Visit (HOSPITAL_COMMUNITY): Payer: Self-pay | Admitting: Neurosurgery

## 2014-11-07 DIAGNOSIS — I10 Essential (primary) hypertension: Secondary | ICD-10-CM | POA: Diagnosis not present

## 2014-11-07 DIAGNOSIS — M4712 Other spondylosis with myelopathy, cervical region: Secondary | ICD-10-CM | POA: Diagnosis not present

## 2014-11-07 DIAGNOSIS — Z6835 Body mass index (BMI) 35.0-35.9, adult: Secondary | ICD-10-CM | POA: Diagnosis not present

## 2014-11-07 MED ORDER — CEFAZOLIN SODIUM-DEXTROSE 2-3 GM-% IV SOLR
2.0000 g | INTRAVENOUS | Status: AC
  Administered 2014-11-08: 2 g via INTRAVENOUS
  Filled 2014-11-07: qty 50

## 2014-11-07 NOTE — Progress Notes (Signed)
Pt denies having a stress test, echo and cardiac cath. Pt made aware to not take any Diabetic medication the morning of procedure. Pt made aware to stop taking Aspirin, vitamins, and herbal medications such as fish oil and cinnamon. Do not take any NSAIDs ie: Ibuprofen, Advil, Naproxen or any medication containing Aspirin such as Voltaren and Mobic. Pt SDW-pre -op call completed by both pt and daughter Velvet,  with pt consent.

## 2014-11-08 ENCOUNTER — Inpatient Hospital Stay (HOSPITAL_COMMUNITY)
Admission: RE | Admit: 2014-11-08 | Discharge: 2014-11-09 | DRG: 473 | Disposition: A | Discharge status: Home / Self Care | Payer: Medicare Other | Source: Ambulatory Visit | Attending: Neurosurgery | Admitting: Neurosurgery

## 2014-11-08 ENCOUNTER — Inpatient Hospital Stay (HOSPITAL_COMMUNITY): Payer: Medicare Other | Admitting: Anesthesiology

## 2014-11-08 ENCOUNTER — Inpatient Hospital Stay (HOSPITAL_COMMUNITY): Payer: Medicare Other

## 2014-11-08 ENCOUNTER — Encounter (HOSPITAL_COMMUNITY): Payer: Self-pay | Admitting: *Deleted

## 2014-11-08 ENCOUNTER — Encounter (HOSPITAL_COMMUNITY)
Admission: RE | Disposition: A | Discharge status: Home / Self Care | Payer: Self-pay | Source: Ambulatory Visit | Attending: Neurosurgery

## 2014-11-08 DIAGNOSIS — M4712 Other spondylosis with myelopathy, cervical region: Secondary | ICD-10-CM | POA: Diagnosis present

## 2014-11-08 DIAGNOSIS — Z791 Long term (current) use of non-steroidal anti-inflammatories (NSAID): Secondary | ICD-10-CM

## 2014-11-08 DIAGNOSIS — Z981 Arthrodesis status: Secondary | ICD-10-CM | POA: Diagnosis not present

## 2014-11-08 DIAGNOSIS — E559 Vitamin D deficiency, unspecified: Secondary | ICD-10-CM | POA: Diagnosis present

## 2014-11-08 DIAGNOSIS — M542 Cervicalgia: Secondary | ICD-10-CM | POA: Diagnosis present

## 2014-11-08 DIAGNOSIS — E039 Hypothyroidism, unspecified: Secondary | ICD-10-CM | POA: Diagnosis present

## 2014-11-08 DIAGNOSIS — G4730 Sleep apnea, unspecified: Secondary | ICD-10-CM | POA: Diagnosis present

## 2014-11-08 DIAGNOSIS — M5412 Radiculopathy, cervical region: Secondary | ICD-10-CM | POA: Diagnosis present

## 2014-11-08 DIAGNOSIS — M5012 Cervical disc disorder with radiculopathy, mid-cervical region: Secondary | ICD-10-CM | POA: Diagnosis not present

## 2014-11-08 DIAGNOSIS — I739 Peripheral vascular disease, unspecified: Secondary | ICD-10-CM | POA: Diagnosis present

## 2014-11-08 DIAGNOSIS — I129 Hypertensive chronic kidney disease with stage 1 through stage 4 chronic kidney disease, or unspecified chronic kidney disease: Secondary | ICD-10-CM | POA: Diagnosis not present

## 2014-11-08 DIAGNOSIS — Z7982 Long term (current) use of aspirin: Secondary | ICD-10-CM

## 2014-11-08 DIAGNOSIS — N183 Chronic kidney disease, stage 3 (moderate): Secondary | ICD-10-CM | POA: Diagnosis present

## 2014-11-08 DIAGNOSIS — Z794 Long term (current) use of insulin: Secondary | ICD-10-CM | POA: Diagnosis not present

## 2014-11-08 DIAGNOSIS — E780 Pure hypercholesterolemia: Secondary | ICD-10-CM | POA: Diagnosis not present

## 2014-11-08 DIAGNOSIS — K219 Gastro-esophageal reflux disease without esophagitis: Secondary | ICD-10-CM | POA: Diagnosis not present

## 2014-11-08 DIAGNOSIS — E1040 Type 1 diabetes mellitus with diabetic neuropathy, unspecified: Secondary | ICD-10-CM | POA: Diagnosis present

## 2014-11-08 DIAGNOSIS — Z419 Encounter for procedure for purposes other than remedying health state, unspecified: Secondary | ICD-10-CM

## 2014-11-08 HISTORY — DX: Sleep apnea, unspecified: G47.30

## 2014-11-08 HISTORY — PX: ANTERIOR CERVICAL DECOMP/DISCECTOMY FUSION: SHX1161

## 2014-11-08 HISTORY — DX: Presence of spectacles and contact lenses: Z97.3

## 2014-11-08 HISTORY — PX: SPINE SURGERY: SHX786

## 2014-11-08 HISTORY — DX: Gastro-esophageal reflux disease without esophagitis: K21.9

## 2014-11-08 HISTORY — DX: Unspecified hearing loss, unspecified ear: H91.90

## 2014-11-08 HISTORY — DX: Reserved for inherently not codable concepts without codable children: IMO0001

## 2014-11-08 LAB — BASIC METABOLIC PANEL
ANION GAP: 8 (ref 5–15)
BUN: 20 mg/dL (ref 6–23)
CALCIUM: 9.4 mg/dL (ref 8.4–10.5)
CO2: 29 mmol/L (ref 19–32)
Chloride: 101 mmol/L (ref 96–112)
Creatinine, Ser: 0.88 mg/dL (ref 0.50–1.10)
GFR calc Af Amer: 69 mL/min — ABNORMAL LOW (ref >90)
GFR, EST NON AFRICAN AMERICAN: 60 mL/min — AB (ref >90)
GLUCOSE: 96 mg/dL (ref 70–99)
Potassium: 4.2 mmol/L (ref 3.5–5.1)
Sodium: 138 mmol/L (ref 135–145)

## 2014-11-08 LAB — CBC
HEMATOCRIT: 39.4 % (ref 36.0–46.0)
Hemoglobin: 13.1 g/dL (ref 12.0–15.0)
MCH: 30.2 pg (ref 26.0–34.0)
MCHC: 33.2 g/dL (ref 30.0–36.0)
MCV: 90.8 fL (ref 78.0–100.0)
Platelets: 356 10*3/uL (ref 150–400)
RBC: 4.34 MIL/uL (ref 3.87–5.11)
RDW: 12.1 % (ref 11.5–15.5)
WBC: 8.4 10*3/uL (ref 4.0–10.5)

## 2014-11-08 LAB — GLUCOSE, CAPILLARY
GLUCOSE-CAPILLARY: 94 mg/dL (ref 70–99)
Glucose-Capillary: 121 mg/dL — ABNORMAL HIGH (ref 70–99)

## 2014-11-08 LAB — SURGICAL PCR SCREEN
MRSA, PCR: NEGATIVE
STAPHYLOCOCCUS AUREUS: NEGATIVE

## 2014-11-08 SURGERY — ANTERIOR CERVICAL DECOMPRESSION/DISCECTOMY FUSION 2 LEVELS
Anesthesia: General

## 2014-11-08 MED ORDER — MENTHOL 3 MG MT LOZG: 1.0000 | LOZENGE | OROMUCOSAL | Status: DC | PRN

## 2014-11-08 MED ORDER — MORPHINE SULFATE 2 MG/ML IJ SOLN: 1.0000 mg | INTRAMUSCULAR | Status: DC | PRN

## 2014-11-08 MED ORDER — PHENYLEPHRINE 40 MCG/ML (10ML) SYRINGE FOR IV PUSH (FOR BLOOD PRESSURE SUPPORT)
PREFILLED_SYRINGE | INTRAVENOUS | Status: AC
  Filled 2014-11-08: qty 20

## 2014-11-08 MED ORDER — GLYCOPYRROLATE 0.2 MG/ML IJ SOLN
INTRAMUSCULAR | Status: DC | PRN
  Administered 2014-11-08: 0.4 mg via INTRAVENOUS

## 2014-11-08 MED ORDER — ROCURONIUM BROMIDE 50 MG/5ML IV SOLN
INTRAVENOUS | Status: AC
  Filled 2014-11-08: qty 2

## 2014-11-08 MED ORDER — LOSARTAN POTASSIUM 50 MG PO TABS
50.0000 mg | ORAL_TABLET | Freq: Every day | ORAL | Status: DC
  Filled 2014-11-08: qty 1

## 2014-11-08 MED ORDER — SODIUM CHLORIDE 0.9 % IJ SOLN: 3.0000 mL | INTRAMUSCULAR | Status: DC | PRN

## 2014-11-08 MED ORDER — CYCLOBENZAPRINE HCL 10 MG PO TABS
ORAL_TABLET | ORAL | Status: AC
  Administered 2014-11-08: 10 mg via ORAL
  Filled 2014-11-08: qty 1

## 2014-11-08 MED ORDER — SUFENTANIL CITRATE 50 MCG/ML IV SOLN
INTRAVENOUS | Status: DC | PRN
  Administered 2014-11-08 (×3): 10 ug via INTRAVENOUS

## 2014-11-08 MED ORDER — SUCCINYLCHOLINE CHLORIDE 20 MG/ML IJ SOLN
INTRAMUSCULAR | Status: AC
  Filled 2014-11-08: qty 1

## 2014-11-08 MED ORDER — ONDANSETRON HCL 4 MG/2ML IJ SOLN
INTRAMUSCULAR | Status: AC
  Filled 2014-11-08: qty 4

## 2014-11-08 MED ORDER — MUPIROCIN 2 % EX OINT
TOPICAL_OINTMENT | CUTANEOUS | Status: AC
  Filled 2014-11-08: qty 22

## 2014-11-08 MED ORDER — SUCCINYLCHOLINE CHLORIDE 20 MG/ML IJ SOLN
INTRAMUSCULAR | Status: DC | PRN
  Administered 2014-11-08: 100 mg via INTRAVENOUS

## 2014-11-08 MED ORDER — ACETAMINOPHEN 650 MG RE SUPP: 650.0000 mg | RECTAL | Status: DC | PRN

## 2014-11-08 MED ORDER — INSULIN ASPART PROT & ASPART (70-30 MIX) 100 UNIT/ML ~~LOC~~ SUSP
31.0000 [IU] | Freq: Two times a day (BID) | SUBCUTANEOUS | Status: DC
  Administered 2014-11-09 (×2): 31 [IU] via SUBCUTANEOUS
  Filled 2014-11-08: qty 10

## 2014-11-08 MED ORDER — HYDROCODONE-ACETAMINOPHEN 5-325 MG PO TABS
1.0000 | ORAL_TABLET | ORAL | Status: DC | PRN
  Administered 2014-11-09 (×3): 1 via ORAL
  Filled 2014-11-08 (×3): qty 1

## 2014-11-08 MED ORDER — THROMBIN 5000 UNITS EX SOLR
CUTANEOUS | Status: DC | PRN
  Administered 2014-11-08 (×2): 5000 [IU] via TOPICAL

## 2014-11-08 MED ORDER — HYDROMORPHONE HCL 1 MG/ML IJ SOLN
INTRAMUSCULAR | Status: AC
  Administered 2014-11-08: 0.5 mg via INTRAVENOUS
  Filled 2014-11-08: qty 1

## 2014-11-08 MED ORDER — SENNA 8.6 MG PO TABS
1.0000 | ORAL_TABLET | Freq: Two times a day (BID) | ORAL | Status: DC
  Administered 2014-11-08 – 2014-11-09 (×2): 8.6 mg via ORAL
  Filled 2014-11-08 (×2): qty 1

## 2014-11-08 MED ORDER — DEXAMETHASONE SODIUM PHOSPHATE 4 MG/ML IJ SOLN
INTRAMUSCULAR | Status: AC
  Filled 2014-11-08: qty 1

## 2014-11-08 MED ORDER — ONDANSETRON HCL 4 MG/2ML IJ SOLN
INTRAMUSCULAR | Status: DC | PRN
  Administered 2014-11-08: 4 mg via INTRAVENOUS

## 2014-11-08 MED ORDER — LEVOTHYROXINE SODIUM 100 MCG PO TABS
100.0000 ug | ORAL_TABLET | Freq: Every day | ORAL | Status: DC
  Administered 2014-11-09: 100 ug via ORAL
  Filled 2014-11-08: qty 1

## 2014-11-08 MED ORDER — DEXAMETHASONE SODIUM PHOSPHATE 10 MG/ML IJ SOLN
INTRAMUSCULAR | Status: AC
  Filled 2014-11-08: qty 1

## 2014-11-08 MED ORDER — PRAVASTATIN SODIUM 40 MG PO TABS
40.0000 mg | ORAL_TABLET | Freq: Every day | ORAL | Status: DC
  Administered 2014-11-09: 40 mg via ORAL
  Filled 2014-11-08: qty 1

## 2014-11-08 MED ORDER — LACTATED RINGERS IV SOLN
INTRAVENOUS | Status: DC
  Administered 2014-11-08: 50 mL/h via INTRAVENOUS

## 2014-11-08 MED ORDER — PHENYLEPHRINE HCL 10 MG/ML IJ SOLN
10.0000 mg | INTRAVENOUS | Status: DC | PRN
  Administered 2014-11-08: 20 ug/min via INTRAVENOUS

## 2014-11-08 MED ORDER — PROPOFOL 10 MG/ML IV BOLUS
INTRAVENOUS | Status: AC
  Filled 2014-11-08: qty 20

## 2014-11-08 MED ORDER — LACTATED RINGERS IV SOLN
INTRAVENOUS | Status: DC | PRN
  Administered 2014-11-08 (×2): via INTRAVENOUS

## 2014-11-08 MED ORDER — INSULIN ASPART 100 UNIT/ML ~~LOC~~ SOLN
0.0000 [IU] | Freq: Three times a day (TID) | SUBCUTANEOUS | Status: DC
  Administered 2014-11-09: 3 [IU] via SUBCUTANEOUS
  Administered 2014-11-09: 2 [IU] via SUBCUTANEOUS
  Administered 2014-11-09: 3 [IU] via SUBCUTANEOUS

## 2014-11-08 MED ORDER — INSULIN GLARGINE 100 UNIT/ML ~~LOC~~ SOLN
36.0000 [IU] | Freq: Two times a day (BID) | SUBCUTANEOUS | Status: DC
  Filled 2014-11-08: qty 0.36

## 2014-11-08 MED ORDER — FAMOTIDINE 20 MG PO TABS
20.0000 mg | ORAL_TABLET | Freq: Every day | ORAL | Status: DC
  Administered 2014-11-08: 20 mg via ORAL
  Filled 2014-11-08: qty 1

## 2014-11-08 MED ORDER — ROCURONIUM BROMIDE 100 MG/10ML IV SOLN
INTRAVENOUS | Status: DC | PRN
  Administered 2014-11-08: 40 mg via INTRAVENOUS
  Administered 2014-11-08: 10 mg via INTRAVENOUS

## 2014-11-08 MED ORDER — ROCURONIUM BROMIDE 50 MG/5ML IV SOLN
INTRAVENOUS | Status: AC
  Filled 2014-11-08: qty 1

## 2014-11-08 MED ORDER — PHENYLEPHRINE HCL 10 MG/ML IJ SOLN
INTRAMUSCULAR | Status: AC
  Filled 2014-11-08: qty 3

## 2014-11-08 MED ORDER — OXYCODONE-ACETAMINOPHEN 5-325 MG PO TABS: 1.0000 | ORAL_TABLET | ORAL | Status: DC | PRN

## 2014-11-08 MED ORDER — LIDOCAINE HCL (CARDIAC) 20 MG/ML IV SOLN
INTRAVENOUS | Status: DC | PRN
  Administered 2014-11-08: 50 mg via INTRAVENOUS

## 2014-11-08 MED ORDER — MIDAZOLAM HCL 5 MG/5ML IJ SOLN
INTRAMUSCULAR | Status: DC | PRN
  Administered 2014-11-08: 1 mg via INTRAVENOUS

## 2014-11-08 MED ORDER — PROMETHAZINE HCL 25 MG/ML IJ SOLN: 6.2500 mg | INTRAMUSCULAR | Status: DC | PRN

## 2014-11-08 MED ORDER — SODIUM CHLORIDE 0.9 % IJ SOLN: 3.0000 mL | Freq: Two times a day (BID) | INTRAMUSCULAR | Status: DC

## 2014-11-08 MED ORDER — CALCIUM CARBONATE 1250 (500 CA) MG PO TABS
1.0000 | ORAL_TABLET | Freq: Every day | ORAL | Status: DC
  Administered 2014-11-09: 500 mg via ORAL
  Filled 2014-11-08: qty 1

## 2014-11-08 MED ORDER — ONDANSETRON HCL 4 MG/2ML IJ SOLN: 4.0000 mg | INTRAMUSCULAR | Status: DC | PRN

## 2014-11-08 MED ORDER — POTASSIUM CHLORIDE IN NACL 20-0.9 MEQ/L-% IV SOLN
INTRAVENOUS | Status: DC
  Administered 2014-11-08: 23:00:00 via INTRAVENOUS
  Filled 2014-11-08 (×2): qty 1000

## 2014-11-08 MED ORDER — POLYETHYLENE GLYCOL 3350 17 G PO PACK: 17.0000 g | PACK | Freq: Every day | ORAL | Status: DC | PRN

## 2014-11-08 MED ORDER — DEXAMETHASONE SODIUM PHOSPHATE 10 MG/ML IJ SOLN
INTRAMUSCULAR | Status: DC | PRN
  Administered 2014-11-08: 10 mg via INTRAVENOUS

## 2014-11-08 MED ORDER — SUFENTANIL CITRATE 50 MCG/ML IV SOLN
INTRAVENOUS | Status: AC
  Filled 2014-11-08: qty 1

## 2014-11-08 MED ORDER — MIDAZOLAM HCL 2 MG/2ML IJ SOLN
INTRAMUSCULAR | Status: AC
  Filled 2014-11-08: qty 2

## 2014-11-08 MED ORDER — DEXAMETHASONE SODIUM PHOSPHATE 4 MG/ML IJ SOLN: INTRAMUSCULAR | Status: DC | PRN

## 2014-11-08 MED ORDER — LIDOCAINE-EPINEPHRINE 0.5 %-1:200000 IJ SOLN
INTRAMUSCULAR | Status: DC | PRN
  Administered 2014-11-08: 10 mL

## 2014-11-08 MED ORDER — MUPIROCIN 2 % EX OINT
1.0000 "application " | TOPICAL_OINTMENT | Freq: Once | CUTANEOUS | Status: AC
  Administered 2014-11-08: 1 via TOPICAL

## 2014-11-08 MED ORDER — CANAGLIFLOZIN 300 MG PO TABS
300.0000 mg | ORAL_TABLET | Freq: Every day | ORAL | Status: DC
  Administered 2014-11-09: 300 mg via ORAL
  Filled 2014-11-08 (×2): qty 300

## 2014-11-08 MED ORDER — ZOLPIDEM TARTRATE 5 MG PO TABS
5.0000 mg | ORAL_TABLET | Freq: Every evening | ORAL | Status: DC | PRN
  Administered 2014-11-09: 5 mg via ORAL
  Filled 2014-11-08: qty 1

## 2014-11-08 MED ORDER — ARTIFICIAL TEARS OP OINT
TOPICAL_OINTMENT | OPHTHALMIC | Status: DC | PRN
  Administered 2014-11-08: 1 via OPHTHALMIC

## 2014-11-08 MED ORDER — ASPIRIN 81 MG PO CHEW
81.0000 mg | CHEWABLE_TABLET | Freq: Every day | ORAL | Status: DC
  Administered 2014-11-09: 81 mg via ORAL
  Filled 2014-11-08 (×2): qty 1

## 2014-11-08 MED ORDER — 0.9 % SODIUM CHLORIDE (POUR BTL) OPTIME
TOPICAL | Status: DC | PRN
  Administered 2014-11-08: 1000 mL

## 2014-11-08 MED ORDER — SODIUM CHLORIDE 0.9 % IV SOLN: 250.0000 mL | INTRAVENOUS | Status: DC

## 2014-11-08 MED ORDER — STERILE WATER FOR INJECTION IJ SOLN
INTRAMUSCULAR | Status: AC
  Filled 2014-11-08: qty 10

## 2014-11-08 MED ORDER — HYDROMORPHONE HCL 1 MG/ML IJ SOLN
0.2500 mg | INTRAMUSCULAR | Status: DC | PRN
  Administered 2014-11-08: 0.5 mg via INTRAVENOUS
  Administered 2014-11-08 (×2): 0.25 mg via INTRAVENOUS
  Administered 2014-11-08 (×2): 0.5 mg via INTRAVENOUS

## 2014-11-08 MED ORDER — GABAPENTIN 300 MG PO CAPS
300.0000 mg | ORAL_CAPSULE | Freq: Two times a day (BID) | ORAL | Status: DC
  Administered 2014-11-08 – 2014-11-09 (×2): 300 mg via ORAL
  Filled 2014-11-08 (×2): qty 1

## 2014-11-08 MED ORDER — FENOFIBRATE 160 MG PO TABS
160.0000 mg | ORAL_TABLET | Freq: Every day | ORAL | Status: DC
Start: 2014-11-09 — End: 2014-11-09
  Administered 2014-11-09: 160 mg via ORAL
  Filled 2014-11-08: qty 1

## 2014-11-08 MED ORDER — METFORMIN HCL 500 MG PO TABS
1000.0000 mg | ORAL_TABLET | Freq: Two times a day (BID) | ORAL | Status: DC
  Administered 2014-11-09 (×2): 1000 mg via ORAL
  Filled 2014-11-08 (×2): qty 2

## 2014-11-08 MED ORDER — NEOSTIGMINE METHYLSULFATE 10 MG/10ML IV SOLN
INTRAVENOUS | Status: DC | PRN
  Administered 2014-11-08: 3 mg via INTRAVENOUS

## 2014-11-08 MED ORDER — LIDOCAINE HCL (CARDIAC) 20 MG/ML IV SOLN
INTRAVENOUS | Status: AC
  Filled 2014-11-08: qty 5

## 2014-11-08 MED ORDER — TRIAMTERENE-HCTZ 75-50 MG PO TABS
1.0000 | ORAL_TABLET | Freq: Every day | ORAL | Status: DC
  Filled 2014-11-08: qty 1

## 2014-11-08 MED ORDER — LOSARTAN POTASSIUM 50 MG PO TABS: 100.0000 mg | ORAL_TABLET | Freq: Every day | ORAL | Status: DC

## 2014-11-08 MED ORDER — HEMOSTATIC AGENTS (NO CHARGE) OPTIME
TOPICAL | Status: DC | PRN
  Administered 2014-11-08: 1 via TOPICAL

## 2014-11-08 MED ORDER — PHENOL 1.4 % MT LIQD: 1.0000 | OROMUCOSAL | Status: DC | PRN

## 2014-11-08 MED ORDER — ACETAMINOPHEN 325 MG PO TABS: 650.0000 mg | ORAL_TABLET | ORAL | Status: DC | PRN

## 2014-11-08 MED ORDER — PROPOFOL 10 MG/ML IV BOLUS
INTRAVENOUS | Status: DC | PRN
  Administered 2014-11-08: 170 mg via INTRAVENOUS

## 2014-11-08 MED ORDER — CYCLOBENZAPRINE HCL 10 MG PO TABS
10.0000 mg | ORAL_TABLET | Freq: Three times a day (TID) | ORAL | Status: DC | PRN
  Administered 2014-11-08: 10 mg via ORAL

## 2014-11-08 SURGICAL SUPPLY — 71 items
BIT DRILL NEURO 2X3.1 SFT TUCH (MISCELLANEOUS) ×1 IMPLANT
BNDG GAUZE ELAST 4 BULKY (GAUZE/BANDAGES/DRESSINGS) ×6 IMPLANT
BUR DRUM 4.0 (BURR) ×2 IMPLANT
BUR DRUM 4.0MM (BURR) ×1
CANISTER SUCT 3000ML PPV (MISCELLANEOUS) ×3 IMPLANT
CONT SPEC 4OZ CLIKSEAL STRL BL (MISCELLANEOUS) ×3 IMPLANT
DECANTER SPIKE VIAL GLASS SM (MISCELLANEOUS) ×3 IMPLANT
DRAPE LAPAROTOMY 100X72 PEDS (DRAPES) ×3 IMPLANT
DRAPE MICROSCOPE LEICA (MISCELLANEOUS) ×3 IMPLANT
DRAPE POUCH INSTRU U-SHP 10X18 (DRAPES) ×3 IMPLANT
DRAPE PROXIMA HALF (DRAPES) IMPLANT
DRILL NEURO 2X3.1 SOFT TOUCH (MISCELLANEOUS) ×3
DURAPREP 6ML APPLICATOR 50/CS (WOUND CARE) ×3 IMPLANT
ELECT COATED BLADE 2.86 ST (ELECTRODE) ×3 IMPLANT
ELECT REM PT RETURN 9FT ADLT (ELECTROSURGICAL) ×3
ELECTRODE REM PT RTRN 9FT ADLT (ELECTROSURGICAL) ×1 IMPLANT
GAUZE SPONGE 4X4 16PLY XRAY LF (GAUZE/BANDAGES/DRESSINGS) IMPLANT
GLOVE BIO SURGEON STRL SZ 6.5 (GLOVE) IMPLANT
GLOVE BIO SURGEON STRL SZ7 (GLOVE) ×3 IMPLANT
GLOVE BIO SURGEON STRL SZ7.5 (GLOVE) IMPLANT
GLOVE BIO SURGEON STRL SZ8 (GLOVE) IMPLANT
GLOVE BIO SURGEON STRL SZ8.5 (GLOVE) IMPLANT
GLOVE BIO SURGEONS STRL SZ 6.5 (GLOVE)
GLOVE BIOGEL M 8.0 STRL (GLOVE) IMPLANT
GLOVE BIOGEL PI IND STRL 7.5 (GLOVE) ×4 IMPLANT
GLOVE BIOGEL PI INDICATOR 7.5 (GLOVE) ×8
GLOVE ECLIPSE 6.5 STRL STRAW (GLOVE) ×6 IMPLANT
GLOVE ECLIPSE 7.0 STRL STRAW (GLOVE) IMPLANT
GLOVE ECLIPSE 7.5 STRL STRAW (GLOVE) ×9 IMPLANT
GLOVE ECLIPSE 8.0 STRL XLNG CF (GLOVE) IMPLANT
GLOVE ECLIPSE 8.5 STRL (GLOVE) IMPLANT
GLOVE EXAM NITRILE LRG STRL (GLOVE) IMPLANT
GLOVE EXAM NITRILE MD LF STRL (GLOVE) IMPLANT
GLOVE EXAM NITRILE XL STR (GLOVE) IMPLANT
GLOVE EXAM NITRILE XS STR PU (GLOVE) IMPLANT
GLOVE INDICATOR 6.5 STRL GRN (GLOVE) IMPLANT
GLOVE INDICATOR 7.0 STRL GRN (GLOVE) IMPLANT
GLOVE INDICATOR 7.5 STRL GRN (GLOVE) IMPLANT
GLOVE INDICATOR 8.0 STRL GRN (GLOVE) IMPLANT
GLOVE INDICATOR 8.5 STRL (GLOVE) IMPLANT
GLOVE OPTIFIT SS 8.0 STRL (GLOVE) IMPLANT
GLOVE SURG SS PI 6.5 STRL IVOR (GLOVE) IMPLANT
GOWN STRL REUS W/ TWL LRG LVL3 (GOWN DISPOSABLE) ×1 IMPLANT
GOWN STRL REUS W/ TWL XL LVL3 (GOWN DISPOSABLE) ×3 IMPLANT
GOWN STRL REUS W/TWL 2XL LVL3 (GOWN DISPOSABLE) IMPLANT
GOWN STRL REUS W/TWL LRG LVL3 (GOWN DISPOSABLE) ×2
GOWN STRL REUS W/TWL XL LVL3 (GOWN DISPOSABLE) ×6
KIT BASIN OR (CUSTOM PROCEDURE TRAY) ×3 IMPLANT
KIT ROOM TURNOVER OR (KITS) ×3 IMPLANT
LIQUID BAND (GAUZE/BANDAGES/DRESSINGS) ×6 IMPLANT
NEEDLE HYPO 25X1 1.5 SAFETY (NEEDLE) ×3 IMPLANT
NEEDLE SPNL 22GX3.5 QUINCKE BK (NEEDLE) IMPLANT
NS IRRIG 1000ML POUR BTL (IV SOLUTION) ×3 IMPLANT
PACK LAMINECTOMY NEURO (CUSTOM PROCEDURE TRAY) ×3 IMPLANT
PAD ARMBOARD 7.5X6 YLW CONV (MISCELLANEOUS) ×6 IMPLANT
PIN DISTRACTION 14MM (PIN) ×6 IMPLANT
PLATE HELIX R 22MM (Plate) ×6 IMPLANT
RUBBERBAND STERILE (MISCELLANEOUS) ×6 IMPLANT
SCREW 4.0X13 (Screw) ×7 IMPLANT
SCREW 4.0X13MM (Screw) ×14 IMPLANT
SPACER ACF PARALLEL 7MM (Bone Implant) ×3 IMPLANT
SPACER CC-ACF 8MM PARALLEL (Bone Implant) ×3 IMPLANT
SPONGE INTESTINAL PEANUT (DISPOSABLE) ×3 IMPLANT
SPONGE SURGIFOAM ABS GEL SZ50 (HEMOSTASIS) ×3 IMPLANT
SUT VIC AB 0 CT1 27 (SUTURE)
SUT VIC AB 0 CT1 27XBRD ANTBC (SUTURE) IMPLANT
SUT VIC AB 3-0 SH 8-18 (SUTURE) ×9 IMPLANT
SYR 20ML ECCENTRIC (SYRINGE) ×3 IMPLANT
TOWEL OR 17X24 6PK STRL BLUE (TOWEL DISPOSABLE) ×3 IMPLANT
TOWEL OR 17X26 10 PK STRL BLUE (TOWEL DISPOSABLE) ×3 IMPLANT
WATER STERILE IRR 1000ML POUR (IV SOLUTION) ×3 IMPLANT

## 2014-11-08 NOTE — Anesthesia Postprocedure Evaluation (Signed)
  Anesthesia Post-op Note  Patient: Catherine Wood  Procedure(s) Performed: Procedure(s) with comments: Cervical three-four,  Cervical six-seven anterior cervical decompression with fusion plating and bonegraft (N/A) - Cervical three-four,  Cervical six-seven anterior cervical decompression with fusion plating and bonegraft  Patient Location: PACU  Anesthesia Type:General  Level of Consciousness: awake and alert   Airway and Oxygen Therapy: Patient Spontanous Breathing  Post-op Pain: mild  Post-op Assessment: Post-op Vital signs reviewed  Post-op Vital Signs: stable  Last Vitals:  Filed Vitals:   11/08/14 2130  BP: 159/58  Pulse: 84  Temp:   Resp: 19    Complications: No apparent anesthesia complications

## 2014-11-08 NOTE — Transfer of Care (Signed)
Immediate Anesthesia Transfer of Care Note  Patient: Catherine Wood  Procedure(s) Performed: Procedure(s) with comments: Cervical three-four,  Cervical six-seven anterior cervical decompression with fusion plating and bonegraft (N/A) - Cervical three-four,  Cervical six-seven anterior cervical decompression with fusion plating and bonegraft  Patient Location: PACU  Anesthesia Type:General  Level of Consciousness: awake, sedated, patient cooperative and responds to stimulation  Airway & Oxygen Therapy: Patient Spontanous Breathing and Patient connected to nasal cannula oxygen  Post-op Assessment: Report given to RN, Post -op Vital signs reviewed and stable and Patient moving all extremities X 4  Post vital signs: Reviewed and stable  Last Vitals:  Filed Vitals:   11/08/14 2056  BP:   Pulse:   Temp: 37 C  Resp:     Complications: No apparent anesthesia complications

## 2014-11-08 NOTE — H&P (Addendum)
neck pain  Catherine Wood is a 81 year old woman I'm seeing for initial consultation.  She was initially seen in the emergency department last night with a proximally 4 weeks of right-sided neck pain, and left arm numbness and tingling.  There is no identifiable inciting event for her symptoms starting a month ago.  She is describing more of a nuisance, catching type pain on the right side of her neck.  Her primary complaint is the numbness of the left arm.  She says because of this numbness, her hand feels heavy and weak, and she has difficulty using it for fine tasks such as buttoning her shirt.  She does have baseline hip arthritis, but says that over the last 6 months she has required the use of a cane to help her walk.  She does not have any right arm symptoms.  She does not have any changes in bladder function.              PAST MEDICAL/SURGICAL HISTORY   (Detailed)     Disease/disorder    Onset Date    Management    Date    Comments            thyroid surgery    1978       Diabetes mellitus                      High cholesterol                      Hypertension                      Thyroid disease                               Family History  (Detailed)  Relationship    Family Member Name    Deceased    Age at Death    Condition    Onset Age    Cause of Death                      Family history of Hypertension         N                      Family history of Diabetes mellitus         N  Father                   Cancer, colon         N  Mother                   Cancer, unknown         N        SOCIAL HISTORY  (Detailed)  Tobacco use reviewed.  Preferred language is [place].   Smoking status: Never smoker.     SMOKING STATUS  Use Status    Type    Smoking Status    Usage Per  Day    Years Used    Total Pack Years  no/never         Never smoker                       HOME ENVIRONMENT/SAFETY  The patient has not fallen in the last year.  MEDICATIONS(added, continued or stopped this visit):  Started    Medication    Directions    Instruction    Stopped       aspirin 81 mg chewable tablet    chew 1 tablet by oral route  every day                 Calcium 600 600 mg (1,500 mg) tablet    Take as directed                 Cinnamon 500 mg capsule    Take as directed                 fenofibrate 160 mg tablet    take 1 tablet by oral route  every day                 Ferretts 325 mg (106 mg iron) tablet    Take as directed                 Fish Oil Concentrate 1,000 mg capsule    Take as directed                 furosemide 40 mg tablet    take 1 tablet by oral route  every day                 gabapentin 300 mg capsule    take 1 capsule by oral route 3 times every day                 gatifloxacin 0.5 % eye drops    instill 1 drop by ophthalmic route 2 times every day into affected eye(s) while awake on days 2 through 7 of treatment                 Humalog Mix 75-25 KwikPen 100 unit/mL subcutaneous insulin pen    inject by subcutaneous route per prescriber's instructions. Insulin dosing requires individualization.                 ICaps    Take as directed                 Invokana 300 mg tablet    take 1 tablet by oral route  every day before the first meal of the day                 Lantus 100 unit/mL subcutaneous solution    inject by subcutaneous route as per insulin protocol                 Lantus Solostar 100 unit/mL (3 mL) subcutaneous insulin pen    inject 50 unit by subcutaneous route  every day                 losartan 100  mg tablet    take 1 tablet by oral route  every day                 magnesium oxide 400 mg capsule    Take as directed                 meloxicam 15 mg tablet    take 1 tablet by oral route  every day                 metformin 1,000 mg tablet    take 1 tablet by oral route 2 times every day with morning and  evening meals                 pravastatin 40 mg tablet    take 1 tablet by oral route  every day                 prednisolone acetate 1 % eye drops,suspension    instill 1 drop by ophthalmic route 2 times every day into affected eye(s)                 ranitidine 300 mg capsule    take 1 capsule by oral route  every day at bedtime                 Tirosint 100 mcg capsule    take 1 capsule by oral route  every day                 Vitamin D3 5,000 unit tablet    Take as directed                     ALLERGIES:  Ingredient    Reaction    Medication Name    Comment  NO KNOWN ALLERGIES                 No known allergies.        REVIEW OF SYSTEMS  System    Neg/Pos    Details  Constitutional    Negative    Chills, fatigue, fever, malaise, night sweats, weight gain and weight loss.  ENMT    Positive    Hearing loss, Wears glasses, cataracts.  ENMT    Negative    Ear drainage, nasal drainage, otalgia, sinus pressure and sore throat.  Eyes    Positive    Vision changes.  Eyes    Negative    Eye discharge and eye pain.  Respiratory    Negative    Chronic cough, cough, dyspnea, known TB exposure and wheezing.  Cardio    Positive    Edema.  Cardio    Negative    Chest pain, claudication and irregular heartbeat/palpitations.  GI    Negative    Abdominal pain, blood in stool, change in stool pattern, constipation, decreased appetite, diarrhea, heartburn, nausea and  vomiting.  GU    Negative    Dysuria, hematuria, hot flashes, irregular menses, polyuria, urinary frequency, urinary incontinence and urinary retention.  Endocrine    Negative    Cold intolerance, heat intolerance, polydipsia and polyphagia.  Neuro    Positive    Extremity weakness, Arm/leg weakness.  Neuro    Negative    Dizziness, gait disturbance, headache, memory impairment, numbness in extremity, seizures and tremors.  Psych    Negative    Anxiety, depression and insomnia.  Integumentary    Negative    Brittle hair, brittle nails, change in shape/size of mole(s), hair loss, hirsutism, hives, pruritus, rash and skin lesion.  MS    Positive    Joint pain, Neck pain, Arm pain.  MS    Negative    Back pain, joint swelling and muscle weakness.  Hema/Lymph    Negative    Easy bleeding, easy bruising and lymphadenopathy.  Allergic/Immuno    Negative    Contact allergy, environmental allergies, food allergies and seasonal allergies.  Reproductive    Negative    Breast discharge, breast lump(s), dysmenorrhea, dyspareunia, history of abnormal PAP smear and vaginal discharge.  Vitals  Date    Temp F    BP    Pulse    Ht In    Wt Lb    BMI    BSA    Pain Score  10/26/2014         147/78    82    64    204.8    35.15         10/10                                                                                                                        PHYSICAL EXAM  General  Level of Distress:             no acute distress  Overall Appearance:        Normal           Neurological  Orientation:                                                         normal  Attention Span and Concentration:           normal  Language:                                                           Fluent, appropriate                                                                                   Right                                     Left  Sensation:                                                           normal                                   normal     Musculoskeletal  Gait and  Station:                                               normal     Best boy  . Any abnormal findings will be noted below.                                                     Right                                     Left  Deltoid:                                5/5                                          4+/5  Biceps:                                 5/5                                          4+/5  Triceps:                                5/5                                          4+/5  Wrist Extensor:                  5/5                                          4+/5  Grip:                                      5/5                                          4+/5  Finger Extensor:               5/5                                          5/5  Hip Flexor:  5/5                                          5/5  Knee Extensor:                  5/5                                          5/5  Tib Anterior:                        5/5                                          5/5  EHL:                                      5/5                                          5/5  Medial Gastroc:                 5/5                                          5/5        Deep Tendon Reflexes                                                  Right                                     Left  Biceps:                                 absent                                   absent  Triceps:                                absent                                   absent  Brachiloradialis:                 absent  absent  Patellar:                                normal                                    normal  Achilles:                               absent                                   absent     Sensory  Sensation was tested at C2 to T1 and L1 to S1.     Cranial Nerves  II. Optic Nerve/Visual Fields:     bilateral normal  III. Oculomotor:                              bilateral EOMI  IV. Trochlear:                                   bilateral EOMI  V. Trigeminal:                                  bilateral Facial sensation intact  VI. Abducens:                                 bilateral EOMI  VII. Facial:                                        bilateral normal  VIII. Acoustic/Vestibular:            bilateral Intact to finger rub  IX. Glossopharyngeal:                 bilateral Palate elevates symetrically  X. Vagus:                                         bilateral Uvula midline  XI. Spinal Accessory:                  bilateral Normal shoulder shrug  XII. Hypoglossal:                           bilateral Tongue midline     Motor and other Tests  Pronator drift:                    absent  Right                                     Left  Hoffman's:                          absent                                   absent  Toe Walk:                            normal                                   normal  Heel Walk:                           normal                                   normal  Tandem Gait:                      abnormal                              abnormal              DIAGNOSTIC RESULTS  MRI of the cervical spine was reviewed.  This demonstrates a fairly large left eccentric disc herniation at C3 C4 with significant spinal stenosis, and spinal cord compression.  At C6 C7, there is a fairly large right eccentric disc herniation causing mild cord flattening, and right-sided foraminal stenosis.           IMPRESSION  81 year old woman  with cervical myelopathy due to a large left eccentric C3 C4 disc herniation with spinal cord compression.  Her right-sided neck pain is less of a concern to her than her left arm numbness, and likely related to the C6 C7 disc, although at this level there is minimal spinal cord compression, and more foraminal stenosis however clinically the patient does not have any right arm symptoms.     Completed Orders (this encounter)  Order    Details    Reason    Side    Interpretation    Result    Initial Treatment Date    Region  Lifestyle education regarding diet    Patient encouraged to eat a well balanced diet.                                Hypertension education    Patient to follow up with primary care provider.                                   Assessment/Plan  #    Detail Type    Description  1.    Assessment    Cervical spondylosis with myelopathy (M47.12).  2.    Assessment    Body mass index (BMI) 35.0-35.9, adult (Z68.35).       Plan Orders    Today's instructions / counseling include(s) Lifestyle education regarding diet.               3.    Assessment    Essential (primary) hypertension (I10).                        Pain Assessment/Treatment  Pain Scale: 10/10.  Method: Numeric Pain Intensity Scale.  Location: arm pain.  Onset: 09/25/2014.  Duration: varies.  Quality: discomforting.  Pain Assessment/Treatment follow-up plan of care: Patient taking medication as prescribed..     Fall Risk Plan  The patient has not fallen in the last year.     - Would recommend ACDF at C3 C4 for decompression of the spinal cord and c 6/7 due to pain in the right neck and shoulder  - The patient's daughter has a family member treated by Dr. Christella Noa in our office, and would like a second opinion from him.     I did review the MRI findings  with the patient.  Treatment options were reviewed, and I did recommend surgical decompression given the spinal cord compression at C3 C4.  The risks of surgery were discussed in detail with the patient which include but are not limited to spinal cord injury which may result in hand, leg, and bowel dysfunction, postoperative dysphagia, dysphonia, neck hematoma, or subsequent surgery for epidural hematoma.  The risk of CSF leak was also discussed. In addition, I explained to her that after spinal fusion surgery, there is a risk of adjacent level disease requiring future surgical intervention. The patient understood our discussion as well as the risks of the surgery.  All questions were answered.

## 2014-11-08 NOTE — Anesthesia Procedure Notes (Signed)
Procedure Name: Intubation Date/Time: 11/08/2014 6:01 PM Performed by: Susa Loffler Pre-anesthesia Checklist: Patient identified, Timeout performed, Emergency Drugs available, Suction available and Patient being monitored Patient Re-evaluated:Patient Re-evaluated prior to inductionOxygen Delivery Method: Circle system utilized Preoxygenation: Pre-oxygenation with 100% oxygen Intubation Type: IV induction Ventilation: Mask ventilation without difficulty and Oral airway inserted - appropriate to patient size Laryngoscope Size: Mac and 3 Grade View: Grade II Tube type: Oral Tube size: 7.5 mm Number of attempts: 1 Airway Equipment and Method: Stylet and Oral airway Placement Confirmation: ETT inserted through vocal cords under direct vision,  positive ETCO2 and breath sounds checked- equal and bilateral Secured at: 21 cm Tube secured with: Tape Dental Injury: Teeth and Oropharynx as per pre-operative assessment

## 2014-11-08 NOTE — Op Note (Addendum)
11/08/2014  9:19 PM  PATIENT:  Catherine Wood  81 y.o. female  PRE-OPERATIVE DIAGNOSIS:  cervical spondylosis with myelopathy C3/4, C6/7  POST-OPERATIVE DIAGNOSIS:  cervical spondylosis with myelopathy C3/4, C6/7 PROCEDURE:  Anterior Cervical decompression C3/4, and C6/7 via separate incisions Arthrodesis C3/4 with 38mm structural allograft, Arthrodesis C6/7 with 53mm structural allograft Anterior instrumentation(Nuvasive Helix R) C3/4, C6/7(separate plates)  SURGEON:   Surgeon(s): Ashok Pall, MD Faythe Ghee, MD   ASSISTANTS:Kritzer, Louie Casa  ANESTHESIA:   general  EBL:  Total I/O In: 600 [I.V.:600] Out: -   BLOOD ADMINISTERED:none  CELL SAVER GIVEN:none  COUNT:per nursing  DRAINS: none   SPECIMEN:  No Specimen  DICTATION: Catherine Wood was taken to the operating room, intubated, and placed under general anesthesia without difficulty. She was positioned supine with her head in slight extension on a horseshoe headrest. The neck was prepped and draped in a sterile manner. I infiltrated 5 cc's 1/2%lidocaine/1:200,000 strength epinephrine into the planned incision for the C3/4 exposure starting from the midline to the medial border of the left sternocleidomastoid muscle. I opened the incision with a 10 blade and dissected sharply through soft tissue to the platysma. I dissected in the plane superior to the platysma both rostrally and caudally. I then opened the platysma in a horizontal fashion with Metzenbaum scissors, and dissected in the inferior plane rostrally and caudally. With both blunt and sharp technique I created an avascular corridor to the cervical spine. I placed a spinal needle(s) in the disc space at 3/4 . I then reflected the longus colli from C3 to C4 and placed self retaining retractors. I opened the disc space(s) at 3/4 with a 15 blade. I removed disc with curettes, Kerrison punches, and the drill. Using the drill I removed osteophytes and prepared for the decompression.   I decompressed the spinal canal and the C4 root(s) with the drill, Kerrison punches, and the curettes. I used the microscope to aid in microdissection. I removed the posterior longitudinal ligament to fully expose and decompress the thecal sac. I exposed the roots laterally taking down the C3/4 uncovertebral joints. With the decompression complete I moved on to the arthrodesis. I used the drill to level the surfaces of C3/4. I removed soft tissue to prepare the disc space and the bony surfaces. I measured the space and placed a 24mm structural allograft into the disc space.  I then placed the anterior instrumentation. I placed 2 screws in each vertebral body through the plate. I locked the screws into place. Intraoperative xray showed the graft, plate, and screws to be in good position.  I opened the incision to expose the C6/7 disc space with a 10 blade and dissected sharply through soft tissue to the platysma. I dissected in the plane superior to the platysma both rostrally and caudally. I then opened the platysma in a horizontal fashion with Metzenbaum scissors, and dissected in the inferior plane rostrally and caudally. With both blunt and sharp technique I created an avascular corridor to the cervical spine. I placed a spinal needle(s) in the disc space at C5/6, and 4/5. I was able to then count down to the C6/7 space . I then reflected the longus colli from C6 to C7 and placed self retaining retractors. I opened the disc space(s) at C6/7 with a 15 blade. I removed disc with curettes, Kerrison punches, and the drill. Using the drill I removed osteophytes and prepared for the decompression.  I decompressed the spinal canal and the C7  root(s) with the drill, Kerrison punches, and the curettes. I used the microscope to aid in microdissection. I removed the posterior longitudinal ligament to fully expose and decompress the thecal sac. I exposed the roots laterally taking down the 6/7 uncovertebral joints.  With the decompression complete I moved on to the arthrodesis. I used the drill to level the surfaces of C6, and C7. I removed soft tissue to prepare the disc space and the bony surfaces. I measured the space and placed an 75mm structural allograft into the disc space.  I then placed the anterior instrumentation. I placed 2 screws in C7, and only one screw on the left at C6. This was because the right side screw would have penetrated the implanted bone and the bone was too soft to move the plate and remove the screws already in place. I locked the screws into place. Intraoperative xray showed the graft, plate, and screws to be in good position. I irrigated the wound, achieved hemostasis, and closed the wound in layers. I approximated the platysma, and the subcuticular plane with vicryl sutures. I used Dermabond for a sterile dressing.   I irrigated the wound, achieved hemostasis, and closed the wound in layers. I approximated the platysma, and the subcuticular plane with vicryl sutures. I used Dermabond for a sterile dressing for each incision  PLAN OF CARE: Admit to inpatient   PATIENT DISPOSITION:  PACU - hemodynamically stable.   Delay start of Pharmacological VTE agent (>24hrs) due to surgical blood loss or risk of bleeding:  yes

## 2014-11-08 NOTE — Anesthesia Preprocedure Evaluation (Addendum)
Anesthesia Evaluation  Patient identified by MRN, date of birth, ID band Patient awake    Reviewed: Allergy & Precautions, NPO status , Patient's Chart, lab work & pertinent test results  Airway Mallampati: II  TM Distance: >3 FB Neck ROM: Full    Dental  (+) Teeth Intact, Dental Advisory Given   Pulmonary shortness of breath, sleep apnea ,  breath sounds clear to auscultation        Cardiovascular hypertension, + Peripheral Vascular Disease Rhythm:Regular Rate:Normal     Neuro/Psych    GI/Hepatic Neg liver ROS, GERD-  ,  Endo/Other  diabetesHypothyroidism   Renal/GU Renal disease     Musculoskeletal  (+) Arthritis -,   Abdominal   Peds  Hematology   Anesthesia Other Findings   Reproductive/Obstetrics                           Anesthesia Physical Anesthesia Plan  ASA: III  Anesthesia Plan: General   Post-op Pain Management:    Induction: Intravenous  Airway Management Planned: Oral ETT  Additional Equipment:   Intra-op Plan:   Post-operative Plan: Extubation in OR  Informed Consent: I have reviewed the patients History and Physical, chart, labs and discussed the procedure including the risks, benefits and alternatives for the proposed anesthesia with the patient or authorized representative who has indicated his/her understanding and acceptance.   Dental advisory given  Plan Discussed with: Anesthesiologist, Surgeon and CRNA  Anesthesia Plan Comments:        Anesthesia Quick Evaluation

## 2014-11-08 NOTE — Progress Notes (Signed)
Pt arrived to 4N04 alert and oriented x4. Oriented to room. Questions answered. Will continue to monitor. Bobbye Charleston, RN

## 2014-11-09 ENCOUNTER — Encounter (HOSPITAL_COMMUNITY): Payer: Self-pay | Admitting: Neurosurgery

## 2014-11-09 LAB — GLUCOSE, CAPILLARY
Glucose-Capillary: 172 mg/dL — ABNORMAL HIGH (ref 70–99)
Glucose-Capillary: 175 mg/dL — ABNORMAL HIGH (ref 70–99)
Glucose-Capillary: 147 mg/dL — ABNORMAL HIGH (ref 70–99)

## 2014-11-09 MED ORDER — CYCLOBENZAPRINE HCL 10 MG PO TABS: 10.0000 mg | ORAL_TABLET | Freq: Three times a day (TID) | ORAL | Status: DC | PRN

## 2014-11-09 MED ORDER — ACETAMINOPHEN-CODEINE #3 300-30 MG PO TABS: 1.0000 | ORAL_TABLET | Freq: Four times a day (QID) | ORAL | Status: DC | PRN

## 2014-11-09 NOTE — Discharge Instructions (Signed)

## 2014-11-09 NOTE — Progress Notes (Signed)
Utilization review completed.  

## 2014-11-09 NOTE — Progress Notes (Signed)
D/C orders received.Pt and family refused home health therapy and stated pt would have 24 hour supervision. Pt and family educated on d/c instructions. Pt verbalized understanding. Pt handed d/c packet and prescriptions. IV removed. Pt taken downstairs by staff via wheelchair.

## 2014-11-09 NOTE — Care Management Note (Signed)
    Page 1 of 1   11/09/2014     11:26:36 AM CARE MANAGEMENT NOTE 11/09/2014  Patient:  Catherine Wood   Account Number:  [id number]  Date Initiated:  11/09/2014  Documentation initiated by:  Marjie Chea  Subjective/Objective Assessment:   Patient was admitted for ACDF.  Lives at home alone.     Action/Plan:   Will follow for discharge needs pending PT/OT evals and physician orders.   Anticipated DC Date:     Anticipated DC Plan:  [place]  CM consult      Choice offered to / List presented to:             Status of service:  In process, will continue to follow Medicare Important Message given?   (If response is "NO", the following Medicare IM given date fields will be blank) Date Medicare IM given:   Medicare IM given by:   Date Additional Medicare IM given:   Additional Medicare IM given by:    Discharge Disposition:    Per UR Regulation:  Reviewed for med. necessity/level of care/duration of stay  If discussed at [place] of Stay Meetings, dates discussed:    Comments:

## 2014-11-09 NOTE — Discharge Summary (Signed)
Physician Discharge Summary  Patient ID: Catherine Wood MRN: [id number] DOB/AGE: 1931-05-28 81 y.o.  Admit date: 11/08/2014 Discharge date: 11/09/2014  Admission Diagnoses:Cervical spondylosis with myelopathy C3/4, Cervical displaced disc C6/7, Cervical radiculopathy  Discharge Diagnoses: same Active Problems:   Cervical spondylosis with myelopathy   Discharged Condition: good  Hospital Course: Catherine Wood was admitted to the hospital and taken to the operating room for an uncomplicated ACDF at G3/1, and C6/7. I did this with two horizontal incisions. Post op she is eating well, ambulating, and voiding well. Her wound is clean, dry, and without signs of infection. She is moving all extremities well. Speaking voice is normal.   Treatments: surgery: cervical spondylosis with myelopathy C3/4, C6/7 PROCEDURE:  Anterior Cervical decompression C3/4, and C6/7 via separate incisions Arthrodesis C3/4 with 72mm structural allograft, Arthrodesis C6/7 with 56mm structural allograft Anterior instrumentation(Nuvasive Helix R) C3/4, C6/7(separate plates)   Discharge Exam: Blood pressure 121/45, pulse 71, temperature 96.8 F (36 C), temperature source Oral, resp. rate 18, height 5\' 4"  (1.626 m), weight 93.895 kg (207 lb), SpO2 93 %. General appearance: alert, cooperative, appears stated age and no distress Neurologic: Alert and oriented X 3, normal strength and tone. Normal symmetric reflexes. Normal coordination and gait  Disposition: 01-Home or Self Care cervical spondylosis with myelopathy    Medication List    TAKE these medications        acetaminophen-codeine 300-30 MG per tablet  Commonly known as:  TYLENOL #3  Take 1 tablet by mouth every 6 (six) hours as needed for moderate pain.     aspirin 81 MG tablet  Take 81 mg by mouth daily.     CALCIUM 600 PO  Take 600 mg by mouth daily.     canagliflozin 300 MG Tabs tablet  Commonly known as:  INVOKANA  Take 300 mg by mouth daily before  breakfast.     cyclobenzaprine 10 MG tablet  Commonly known as:  FLEXERIL  Take 1 tablet (10 mg total) by mouth 3 (three) times daily as needed for muscle spasms.     fenofibrate 160 MG tablet  TAKE 1 TABLET DAILY     Fish Oil 1000 MG Caps  Take by mouth daily.     furosemide 40 MG tablet  Commonly known as:  LASIX  TAKE 1 TO 2 TABLETS DAILY DEPENDING ON SWELLING/EDEMA     gabapentin 300 MG capsule  Commonly known as:  NEURONTIN  1 in the AM, 2 in the afternoon, and 2 at bed time     ICAPS PO  Take by mouth daily.     insulin lispro protamine-lispro (75-25) 100 UNIT/ML Susp injection  Commonly known as:  HUMALOG 75/25 MIX  Inject 31 Units into the skin 2 (two) times daily.     LANTUS SOLOSTAR [place]  Inject 36 Units into the skin 2 (two) times daily.     levothyroxine 100 MCG tablet  Commonly known as:  SYNTHROID, LEVOTHROID  Take 1 tablet (100 mcg total) by mouth daily before breakfast.     losartan 50 MG tablet  Commonly known as:  COZAAR  Take 50 mg by mouth daily.     losartan 100 MG tablet  Commonly known as:  COZAAR  TAKE 1 TABLET ONCE A DAY FOR HIGH BLOOD PRESSURE     Magnesium 400 MG Caps  Take by mouth 4 (four) times daily.     metFORMIN 1000 MG tablet  Commonly known as:  GLUCOPHAGE  TAKE (1) TABLET  TWICE A DAY WITH MEALS (BREAKFAST AND SUPPER)     OVER THE COUNTER MEDICATION  as needed. Macular Degeneration refresh eye drops     PEN NEEDLES 29GX1/2" 29G X 12MM Misc  USE 4 TIMES A DAY     pravastatin 40 MG tablet  Commonly known as:  PRAVACHOL  TAKE 1 TABLET DAILY FOR CHOLESTEROL     predniSONE 10 MG tablet  Commonly known as:  DELTASONE  Take 2 tablets (20 mg total) by mouth 2 (two) times daily.     ranitidine 300 MG capsule  Commonly known as:  ZANTAC  Take 300 mg by mouth every evening.     triamterene-hydrochlorothiazide 75-50 MG per tablet  Commonly known as:  MAXZIDE  Take 1 tablet by mouth daily.     Vitamin D3 5000 UNITS Caps  Take  5,000 Units by mouth daily.           Follow-up Information    Follow up with Lakoda Raske L, MD In 3 weeks.   Specialty:  Neurosurgery   Why:  call office to make an appointment   Contact information:   [address] [place] [address] [phone]       Signed: Winfield Cunas 11/09/2014, 4:22 PM

## 2014-11-09 NOTE — Evaluation (Signed)
Occupational Therapy Evaluation Patient Details Name: Catherine Wood MRN: [id number] DOB: 1930-09-24 Today's Date: 11/09/2014    History of Present Illness Patient is a 81 y/o female s/p C3-4, 6-7 ACDF. PMH of HTN, IDDM, HLD, macular degeneration and SOB.   Clinical Impression   Pt s/p above. Pt independent with ADLs, PTA. Feel pt will benefit from acute OT to increase independence and strength/coordination prior to d/c.     Follow Up Recommendations  No OT follow up;Supervision/Assistance - 24 hour    Equipment Recommendations  None recommended by OT    Recommendations for Other Services       Precautions / Restrictions Precautions Precautions: Fall;Cervical Precaution Comments: educated on cervical precautions Restrictions Weight Bearing Restrictions: No      Mobility Bed Mobility      General bed mobility comments: not assessed  Transfers Overall transfer level: Needs assistance Equipment used: Rolling walker (2 wheeled) Transfers: Sit to/from Stand Sit to Stand: Supervision         General transfer comment: cues for hand placement.    Balance  Supervision for mobility with RW.                      ADL Overall ADL's : Needs assistance/impaired                 Upper Body Dressing : Set up;Supervision/safety;Sitting   Lower Body Dressing: Moderate assistance;Sit to/from stand   Toilet Transfer: Supervision/safety;Ambulation;RW (chair)           Functional mobility during ADLs: Supervision/safety;Rolling walker General ADL Comments: Educated on LB ADL technique. Pt had difficulty keeping left LE on right knee to don sock. Educated on AE-pt states family can assist her. Discussed incorporating precautions into functional activites. Suggested using straw or filling cup up towards top to help maintain cervical precautions. Family came in at end of session and OT explained d/c recommendation. Recommended pt avoid hot surfaces and sharp items due  to decreased sensation in left hand. Educated on safety.      Vision Pt wears glasses.   Perception     Praxis      Pertinent Vitals/Pain Pain Assessment: No/denies pain     Hand Dominance Right   Extremity/Trunk Assessment Upper Extremity Assessment Upper Extremity Assessment: LUE deficits/detail LUE Deficits / Details: slightly weaker in grip strength compared to right hand LUE Sensation: decreased light touch LUE Coordination: decreased fine motor (slightly off)   Lower Extremity Assessment Lower Extremity Assessment: Defer to PT evaluation       Communication Communication Communication: HOH   Cognition Arousal/Alertness: Awake/alert Behavior During Therapy: WFL for tasks assessed/performed;Impulsive Overall Cognitive Status:  (unsure of baseline) Area of Impairment:  (pt states family can assist with LB dressing, but then later states she thinks she can get dressed herself even though when practicing, pt had assist with left sock)                 General Comments       Exercises Exercises: Other exercises Other Exercises Other Exercises: educated on fine motor coordination exercise/activities for left hand and gave her handout.    Shoulder Instructions      Home Living Family/patient expects to be discharged to:: Private residence Living Arrangements: Alone Available Help at Discharge: Family;Friend(s) (told OT that someone can be there 24/7) Type of Home: House Home Access: Stairs to enter Entrance Stairs-Number of Steps: 1   Home Layout: One level;Laundry or work area  in basement     Bathroom Shower/Tub: Tub/shower unit Shower/tub characteristics: Door Bathroom Toilet: Handicapped height     Home Equipment: Environmental consultant - 2 wheels;Cane - single point;Walker - standard;Bedside commode;Grab bars - tub/shower;Adaptive equipment Adaptive Equipment: Long-handled sponge        Prior Functioning/Environment Level of Independence: Independent with  assistive device(s)        Comments: Pt using SW vs SPC PTA. Only drives to church but usually gets a ride due to visual deficits. Pt's daughter lives in her backyard and can assist at discharge as well as a grandson.    OT Diagnosis: Generalized weakness   OT Problem List: Decreased knowledge of use of DME or AE;Decreased knowledge of precautions;Decreased range of motion;Decreased strength;Decreased coordination;Impaired sensation;Impaired UE functional use   OT Treatment/Interventions: Self-care/ADL training;Therapeutic exercise;DME and/or AE instruction;Therapeutic activities;Patient/family education;Balance training    OT Goals(Current goals can be found in the care plan section) Acute Rehab OT Goals Patient Stated Goal: not stated OT Goal Formulation: With patient Time For Goal Achievement: 11/16/14 Potential to Achieve Goals: Good ADL Goals Pt Will Perform Lower Body Bathing: with modified independence;sit to/from stand Pt Will Perform Lower Body Dressing: with modified independence;sit to/from stand Pt Will Transfer to Toilet: with modified independence;ambulating Pt Will Perform Tub/Shower Transfer: Tub transfer;with supervision;ambulating;rolling walker;shower seat Additional ADL Goal #1: Pt will independently perform HEP for Lt hand to increase strength and coordination.  OT Frequency: Min 2X/week   Barriers to D/C:            Co-evaluation              End of Session Equipment Utilized During Treatment: Gait belt;Rolling walker  Activity Tolerance: Patient tolerated treatment well Patient left: in chair;with call bell/phone within reach;with family/visitor present;with chair alarm set   Time: 1441-1459 OT Time Calculation (min): 18 min Charges:  OT General Charges $OT Visit: 1 Procedure OT Evaluation $Initial OT Evaluation Tier I: 1 Procedure G-CodesBenito Mccreedy OTR/L [id number] 11/09/2014, 3:18 PM

## 2014-11-09 NOTE — Evaluation (Signed)
Physical Therapy Evaluation Patient Details Name: Catherine Wood MRN: [id number] DOB: [birth year] Today's Date: 11/09/2014   History of Present Illness  Patient is a 81 y/o female s/p C3-4, 6-7 ACDF. PMH of HTN, IDDM, HLD, macular degeneration and SOB.  Clinical Impression  Patient presents with pain, generalized weakness and balance deficits impacting mobility. Tolerated ambulation with use of RW for safety however requires VC's to maintain cervical precautions during mobility. Education provided on cervical precautions and handout. Pt reports daughter will be able to provide assist at home. Will need to see if daughter can provide 24/7 at least initially. Pt would benefit from skilled PT to improve transfers, gait, balance and mobility so pt can maximize independence and return to PLOF.    Follow Up Recommendations Home health PT;Supervision for mobility/OOB    Equipment Recommendations  None recommended by PT    Recommendations for Other Services       Precautions / Restrictions Precautions Precautions: Fall;Cervical Precaution Comments: Reviewed precautions and handout Restrictions Weight Bearing Restrictions: No      Mobility  Bed Mobility Overal bed mobility: Needs Assistance Bed Mobility: Rolling;Sidelying to Sit Rolling: Min guard Sidelying to sit: Min guard;HOB elevated       General bed mobility comments: Use of rails for support. Cues for log roll technique.  Transfers Overall transfer level: Needs assistance Equipment used: Rolling walker (2 wheeled) Transfers: Sit to/from Stand Sit to Stand: Supervision         General transfer comment: Supervision for safety as pt impulsive. Stood from EOB x3, from toilet x2.  Ambulation/Gait Ambulation/Gait assistance: Min guard Ambulation Distance (Feet): 250 Feet Assistive device: Rolling walker (2 wheeled) Gait Pattern/deviations: Step-through pattern;Decreased stride length;Trunk flexed   Gait velocity  interpretation: at or above normal speed for age/gender General Gait Details: Pt with increased speed. Cues for RW management and proximity. Some difficulty with turns and staying within RW. Cues to adhere to cervical precautions. Ambulated on RA, Sa02 94%.  Stairs            Wheelchair Mobility    Modified Rankin (Stroke Patients Only)       Balance Overall balance assessment: Needs assistance Sitting-balance support: Feet supported;No upper extremity supported Sitting balance-Leahy Scale: Good Sitting balance - Comments: Able to perform pericare without difficulty and change feminine pad.   Standing balance support: During functional activity Standing balance-Leahy Scale: Fair Standing balance comment: Requires BUE support during gait for safety. Able to perform washing hands and dynamic standing for short periods without UE support.                             Pertinent Vitals/Pain Pain Assessment: Faces Faces Pain Scale: Hurts a little bit Pain Location: anterior neck at surgical site Pain Descriptors / Indicators: Sore Pain Intervention(s): Monitored during session;Repositioned    Home Living Family/patient expects to be discharged to:: Private residence Living Arrangements: Alone Available Help at Discharge: Family;Friend(s);Available PRN/intermittently Type of Home: House Home Access: Stairs to enter   Entrance Stairs-Number of Steps: 1 Home Layout: One level;Laundry or work area in [place]: Environmental consultant - 2 wheels;Cane - single point;Walker - standard;Bedside commode      Prior Function Level of Independence: Independent with assistive device(s)         Comments: Pt using SW vs SPC PTA. Only drives to church but usually gets a ride due to visual deficits. Pt's daughter lives in her backyard and  cana ssist at discharge as well as a grandson.     Hand Dominance        Extremity/Trunk Assessment   Upper Extremity Assessment:  Defer to OT evaluation;LUE deficits/detail       LUE Deficits / Details: Reports numbness in hand.    Lower Extremity Assessment: Generalized weakness         Communication   Communication: HOH  Cognition Arousal/Alertness: Awake/alert Behavior During Therapy: WFL for tasks assessed/performed;Impulsive Overall Cognitive Status: Within Functional Limits for tasks assessed       Memory: Decreased recall of precautions              General Comments      Exercises        Assessment/Plan    PT Assessment Patient needs continued PT services  PT Diagnosis Acute pain;Difficulty walking;Generalized weakness   PT Problem List Pain;Decreased strength;Impaired sensation;Decreased activity tolerance;Decreased balance;Decreased mobility;Decreased knowledge of precautions;Decreased safety awareness;Decreased knowledge of use of DME  PT Treatment Interventions Balance training;Gait training;Stair training;Therapeutic activities;Therapeutic exercise;DME instruction;Functional mobility training;Patient/family education   PT Goals (Current goals can be found in the Care Plan section) Acute Rehab PT Goals Patient Stated Goal: to return home and be independent PT Goal Formulation: With patient Time For Goal Achievement: 11/23/14 Potential to Achieve Goals: Fair    Frequency Min 5X/week   Barriers to discharge Decreased caregiver support Pt lives alone. reports daughter can assist at d/c - need to clarify this.    Co-evaluation               End of Session Equipment Utilized During Treatment: Gait belt Activity Tolerance: Patient tolerated treatment well Patient left: in chair;with call bell/phone within reach;with chair alarm set Nurse Communication: Mobility status         Time: 1336-1400 PT Time Calculation (min) (ACUTE ONLY): 24 min   Charges:   PT Evaluation $Initial PT Evaluation Tier I: 1 Procedure PT Treatments $Self Care/Home Management: 8-22   PT G  CodesCandy Sledge A 11/10/2014, 2:08 PM\ Candy Sledge, PT, DPT [phone]

## 2014-11-10 LAB — HEMOGLOBIN A1C
Hgb A1c MFr Bld: 7.6 % — ABNORMAL HIGH (ref 4.8–5.6)
Mean Plasma Glucose: 171 mg/dL

## 2014-11-27 ENCOUNTER — Encounter: Payer: Self-pay | Admitting: *Deleted

## 2014-11-29 DIAGNOSIS — E1142 Type 2 diabetes mellitus with diabetic polyneuropathy: Secondary | ICD-10-CM | POA: Diagnosis not present

## 2014-11-29 DIAGNOSIS — L84 Corns and callosities: Secondary | ICD-10-CM | POA: Diagnosis not present

## 2014-11-29 DIAGNOSIS — B351 Tinea unguium: Secondary | ICD-10-CM | POA: Diagnosis not present

## 2014-12-01 DIAGNOSIS — M4712 Other spondylosis with myelopathy, cervical region: Secondary | ICD-10-CM | POA: Diagnosis not present

## 2014-12-05 DIAGNOSIS — H3532 Exudative age-related macular degeneration: Secondary | ICD-10-CM | POA: Diagnosis not present

## 2014-12-07 ENCOUNTER — Other Ambulatory Visit: Payer: Self-pay | Admitting: Physician Assistant

## 2014-12-09 ENCOUNTER — Other Ambulatory Visit: Payer: Self-pay | Admitting: *Deleted

## 2014-12-09 MED ORDER — METFORMIN HCL 1000 MG PO TABS: ORAL_TABLET | ORAL | Status: DC

## 2014-12-12 DIAGNOSIS — M7072 Other bursitis of hip, left hip: Secondary | ICD-10-CM | POA: Diagnosis not present

## 2014-12-12 DIAGNOSIS — M5136 Other intervertebral disc degeneration, lumbar region: Secondary | ICD-10-CM | POA: Diagnosis not present

## 2014-12-12 DIAGNOSIS — M7071 Other bursitis of hip, right hip: Secondary | ICD-10-CM | POA: Diagnosis not present

## 2014-12-12 DIAGNOSIS — M545 Low back pain: Secondary | ICD-10-CM | POA: Diagnosis not present

## 2015-01-03 ENCOUNTER — Encounter: Payer: Self-pay | Admitting: Internal Medicine

## 2015-01-03 DIAGNOSIS — H3532 Exudative age-related macular degeneration: Secondary | ICD-10-CM | POA: Diagnosis not present

## 2015-01-04 ENCOUNTER — Other Ambulatory Visit: Payer: Self-pay | Admitting: *Deleted

## 2015-01-04 MED ORDER — FUROSEMIDE 40 MG PO TABS: ORAL_TABLET | ORAL | Status: DC

## 2015-02-02 ENCOUNTER — Other Ambulatory Visit: Payer: Self-pay | Admitting: *Deleted

## 2015-02-02 MED ORDER — LOSARTAN POTASSIUM 100 MG PO TABS: ORAL_TABLET | ORAL | Status: DC

## 2015-02-07 ENCOUNTER — Other Ambulatory Visit: Payer: Self-pay | Admitting: *Deleted

## 2015-02-07 MED ORDER — LEVOTHYROXINE SODIUM 100 MCG PO TABS: 100.0000 ug | ORAL_TABLET | Freq: Every day | ORAL | Status: DC

## 2015-02-09 DIAGNOSIS — Z6833 Body mass index (BMI) 33.0-33.9, adult: Secondary | ICD-10-CM | POA: Diagnosis not present

## 2015-02-09 DIAGNOSIS — M4712 Other spondylosis with myelopathy, cervical region: Secondary | ICD-10-CM | POA: Diagnosis not present

## 2015-02-09 DIAGNOSIS — H3532 Exudative age-related macular degeneration: Secondary | ICD-10-CM | POA: Diagnosis not present

## 2015-02-15 DIAGNOSIS — M545 Low back pain: Secondary | ICD-10-CM | POA: Diagnosis not present

## 2015-02-15 DIAGNOSIS — M5136 Other intervertebral disc degeneration, lumbar region: Secondary | ICD-10-CM | POA: Diagnosis not present

## 2015-02-15 DIAGNOSIS — M7071 Other bursitis of hip, right hip: Secondary | ICD-10-CM | POA: Diagnosis not present

## 2015-02-15 DIAGNOSIS — M7072 Other bursitis of hip, left hip: Secondary | ICD-10-CM | POA: Diagnosis not present

## 2015-02-21 ENCOUNTER — Encounter: Payer: Self-pay | Admitting: Internal Medicine

## 2015-02-21 ENCOUNTER — Ambulatory Visit (INDEPENDENT_AMBULATORY_CARE_PROVIDER_SITE_OTHER): Payer: Medicare Other | Admitting: Internal Medicine

## 2015-02-21 VITALS — BP 126/56 | HR 76 | Temp 97.3°F | Resp 16 | Ht 64.5 in | Wt 197.9 lb

## 2015-02-21 DIAGNOSIS — E559 Vitamin D deficiency, unspecified: Secondary | ICD-10-CM

## 2015-02-21 DIAGNOSIS — Z79899 Other long term (current) drug therapy: Secondary | ICD-10-CM

## 2015-02-21 DIAGNOSIS — Z9181 History of falling: Secondary | ICD-10-CM

## 2015-02-21 DIAGNOSIS — E669 Obesity, unspecified: Secondary | ICD-10-CM

## 2015-02-21 DIAGNOSIS — I10 Essential (primary) hypertension: Secondary | ICD-10-CM

## 2015-02-21 DIAGNOSIS — N183 Chronic kidney disease, stage 3 unspecified: Secondary | ICD-10-CM

## 2015-02-21 DIAGNOSIS — E785 Hyperlipidemia, unspecified: Secondary | ICD-10-CM | POA: Diagnosis not present

## 2015-02-21 DIAGNOSIS — Z1212 Encounter for screening for malignant neoplasm of rectum: Secondary | ICD-10-CM

## 2015-02-21 DIAGNOSIS — H3530 Unspecified macular degeneration: Secondary | ICD-10-CM

## 2015-02-21 DIAGNOSIS — E039 Hypothyroidism, unspecified: Secondary | ICD-10-CM | POA: Diagnosis not present

## 2015-02-21 DIAGNOSIS — E1021 Type 1 diabetes mellitus with diabetic nephropathy: Secondary | ICD-10-CM | POA: Diagnosis not present

## 2015-02-21 DIAGNOSIS — Z1331 Encounter for screening for depression: Secondary | ICD-10-CM

## 2015-02-21 DIAGNOSIS — M5136 Other intervertebral disc degeneration, lumbar region: Secondary | ICD-10-CM

## 2015-02-21 LAB — CBC WITH DIFFERENTIAL/PLATELET
Basophils Absolute: 0 10*3/uL (ref 0.0–0.1)
Basophils Relative: 0 % (ref 0–1)
Eosinophils Absolute: 0.3 10*3/uL (ref 0.0–0.7)
Eosinophils Relative: 2 % (ref 0–5)
HEMATOCRIT: 41.8 % (ref 36.0–46.0)
Hemoglobin: 13.7 g/dL (ref 12.0–15.0)
LYMPHS PCT: 23 % (ref 12–46)
Lymphs Abs: 3.2 10*3/uL (ref 0.7–4.0)
MCH: 27.5 pg (ref 26.0–34.0)
MCHC: 32.8 g/dL (ref 30.0–36.0)
MCV: 83.8 fL (ref 78.0–100.0)
MPV: 10.7 fL (ref 8.6–12.4)
Monocytes Absolute: 1.1 10*3/uL — ABNORMAL HIGH (ref 0.1–1.0)
Monocytes Relative: 8 % (ref 3–12)
NEUTROS ABS: 9.3 10*3/uL — AB (ref 1.7–7.7)
Neutrophils Relative %: 67 % (ref 43–77)
Platelets: 378 10*3/uL (ref 150–400)
RBC: 4.99 MIL/uL (ref 3.87–5.11)
RDW: 14.7 % (ref 11.5–15.5)
WBC: 13.9 10*3/uL — AB (ref 4.0–10.5)

## 2015-02-21 NOTE — Patient Instructions (Signed)
Recommend Adult Low dose Aspirin or baby Aspirin 81 mg daily   To reduce risk of Colon Cancer 20 %,   Skin Cancer 26 % ,   Melanoma 46%   and   Pancreatic cancer 60%  ++++++++++++++++++  Vitamin D goal is between 70-100.   Please make sure that you are taking your Vitamin D as directed.   It is very important as a natural anti-inflammatory   helping hair, skin, and nails, as well as reducing stroke and heart attack risk.   It helps your bones and helps with mood.  It also decreases numerous cancer risks so please take it as directed.   Low Vit D is associated with a 200-300% higher risk for CANCER   and 200-300% higher risk for HEART   ATTACK  &  STROKE.    ......................................  It is also associated with higher death rate at younger ages,   autoimmune diseases like Rheumatoid arthritis, Lupus, Multiple Sclerosis.     Also many other serious conditions, like depression, Alzheimer's  Dementia, infertility, muscle aches, fatigue, fibromyalgia - just to name a few.  +++++++++++++++++++    Recommend the book "The END of DIETING" by Dr Joel Fuhrman   & the book "The END of DIABETES " by Dr Joel Fuhrman  At [employer] - get book & Audio CD's     Being diabetic has a  300% increased risk for heart attack, stroke, cancer, and alzheimer- type vascular dementia. It is very important that you work harder with diet by avoiding all foods that are white. Avoid white rice (brown & wild rice is OK), white potatoes (sweetpotatoes in moderation is OK), White bread or wheat bread or anything made out of white flour like bagels, donuts, rolls, buns, biscuits, cakes, pastries, cookies, pizza crust, and pasta (made from white flour & egg whites) - vegetarian pasta or spinach or wheat pasta is OK. Multigrain breads like Arnold's or [employer], or multigrain sandwich thins or flatbreads.  Diet, exercise and weight loss can reverse and cure diabetes in the early  stages.  Diet, exercise and weight loss is very important in the control and prevention of complications of diabetes which affects every system in your body, ie. Brain - dementia/stroke, eyes - glaucoma/blindness, heart - heart attack/heart failure, kidneys - dialysis, stomach - gastric paralysis, intestines - malabsorption, nerves - severe painful neuritis, circulation - gangrene & loss of a leg(s), and finally cancer and Alzheimers.    I recommend avoid fried & greasy foods,  sweets/candy, white rice (brown or wild rice or Quinoa is OK), white potatoes (sweet potatoes are OK) - anything made from white flour - bagels, doughnuts, rolls, buns, biscuits,white and wheat breads, pizza crust and traditional pasta made of white flour & egg white(vegetarian pasta or spinach or wheat pasta is OK).  Multi-grain bread is OK - like multi-grain flat bread or sandwich thins. Avoid alcohol in excess. Exercise is also important.    Eat all the vegetables you want - avoid meat, especially red meat and dairy - especially cheese.  Cheese is the most concentrated form of trans-fats which is the worst thing to clog up our arteries. Veggie cheese is OK which can be found in the fresh produce section at [employer] or [employer] or [employer]  ++++++++++++++++++++++++++  Preventive Care for Adults  A healthy lifestyle and preventive care can promote health and wellness. Preventive health guidelines for women include the following key practices.  A routine yearly physical is   a good way to check with your health care provider about your health and preventive screening. It is a chance to share any concerns and updates on your health and to receive a thorough exam.  Visit your dentist for a routine exam and preventive care every 6 months. Brush your teeth twice a day and floss once a day. Good oral hygiene prevents tooth decay and gum disease.  The frequency of eye exams is based on your age, health, family medical  history, use of contact lenses, and other factors. Follow your health care provider's recommendations for frequency of eye exams.  Eat a healthy diet. Foods like vegetables, fruits, whole grains, low-fat dairy products, and lean protein foods contain the nutrients you need without too many calories. Decrease your intake of foods high in solid fats, added sugars, and salt. Eat the right amount of calories for you.Get information about a proper diet from your health care provider, if necessary.  Regular physical exercise is one of the most important things you can do for your health. Most adults should get at least 150 minutes of moderate-intensity exercise (any activity that increases your heart rate and causes you to sweat) each week. In addition, most adults need muscle-strengthening exercises on 2 or more days a week.  Maintain a healthy weight. The body mass index (BMI) is a screening tool to identify possible weight problems. It provides an estimate of body fat based on height and weight. Your health care provider can find your BMI and can help you achieve or maintain a healthy weight.For adults 20 years and older:  A BMI below 18.5 is considered underweight.  A BMI of 18.5 to 24.9 is normal.  A BMI of 25 to 29.9 is considered overweight.  A BMI of 30 and above is considered obese.  Maintain normal blood lipids and cholesterol levels by exercising and minimizing your intake of saturated fat. Eat a balanced diet with plenty of fruit and vegetables. If your lipid or cholesterol levels are high, you are over 50, or you are at high risk for heart disease, you may need your cholesterol levels checked more frequently.Ongoing high lipid and cholesterol levels should be treated with medicines if diet and exercise are not working.  If you smoke, find out from your health care provider how to quit. If you do not use tobacco, do not start.  Lung cancer screening is recommended for adults aged 55-80  years who are at high risk for developing lung cancer because of a history of smoking. A yearly low-dose CT scan of the lungs is recommended for people who have at least a 30-pack-year history of smoking and are a current smoker or have quit within the past 15 years. A pack year of smoking is smoking an average of 1 pack of cigarettes a day for 1 year (for example: 1 pack a day for 30 years or 2 packs a day for 15 years). Yearly screening should continue until the smoker has stopped smoking for at least 15 years. Yearly screening should be stopped for people who develop a health problem that would prevent them from having lung cancer treatment.  Avoid use of street drugs. Do not share needles with anyone. Ask for help if you need support or instructions about stopping the use of drugs.  High blood pressure causes heart disease and increases the risk of stroke.  Ongoing high blood pressure should be treated with medicines if weight loss and exercise do not work.  If   you are 55-79 years old, ask your health care provider if you should take aspirin to prevent strokes.  Diabetes screening involves taking a blood sample to check your fasting blood sugar level. This should be done once every 3 years, after age 45, if you are within normal weight and without risk factors for diabetes. Testing should be considered at a younger age or be carried out more frequently if you are overweight and have at least 1 risk factor for diabetes.  Breast cancer screening is essential preventive care for women. You should practice "breast self-awareness." This means understanding the normal appearance and feel of your breasts and may include breast self-examination. Any changes detected, no matter how small, should be reported to a health care provider. Women in their 20s and 30s should have a clinical breast exam (CBE) by a health care provider as part of a regular health exam every 1 to 3 years. After age 40, women should have a  CBE every year. Starting at age 40, women should consider having a mammogram (breast X-ray test) every year. Women who have a family history of breast cancer should talk to their health care provider about genetic screening. Women at a high risk of breast cancer should talk to their health care providers about having an MRI and a mammogram every year.  Breast cancer gene (BRCA)-related cancer risk assessment is recommended for women who have family members with BRCA-related cancers. BRCA-related cancers include breast, ovarian, tubal, and peritoneal cancers. Having family members with these cancers may be associated with an increased risk for harmful changes (mutations) in the breast cancer genes BRCA1 and BRCA2. Results of the assessment will determine the need for genetic counseling and BRCA1 and BRCA2 testing.  Routine pelvic exams to screen for cancer are no longer recommended for nonpregnant women who are considered low risk for cancer of the pelvic organs (ovaries, uterus, and vagina) and who do not have symptoms. Ask your health care provider if a screening pelvic exam is right for you.  If you have had past treatment for cervical cancer or a condition that could lead to cancer, you need Pap tests and screening for cancer for at least 20 years after your treatment. If Pap tests have been discontinued, your risk factors (such as having a new sexual partner) need to be reassessed to determine if screening should be resumed. Some women have medical problems that increase the chance of getting cervical cancer. In these cases, your health care provider may recommend more frequent screening and Pap tests.    Colorectal cancer can be detected and often prevented. Most routine colorectal cancer screening begins at the age of 50 years and continues through age 75 years. However, your health care provider may recommend screening at an earlier age if you have risk factors for colon cancer. On a yearly basis,  your health care provider may provide home test kits to check for hidden blood in the stool. Use of a small camera at the end of a tube, to directly examine the colon (sigmoidoscopy or colonoscopy), can detect the earliest forms of colorectal cancer. Talk to your health care provider about this at age 50, when routine screening begins. Direct exam of the colon should be repeated every 5-10 years through age 75 years, unless early forms of pre-cancerous polyps or small growths are found.  Osteoporosis is a disease in which the bones lose minerals and strength with aging. This can result in serious bone fractures or breaks. The   risk of osteoporosis can be identified using a bone density scan. Women ages 65 years and over and women at risk for fractures or osteoporosis should discuss screening with their health care providers. Ask your health care provider whether you should take a calcium supplement or vitamin D to reduce the rate of osteoporosis.  Menopause can be associated with physical symptoms and risks. Hormone replacement therapy is available to decrease symptoms and risks. You should talk to your health care provider about whether hormone replacement therapy is right for you.  Use sunscreen. Apply sunscreen liberally and repeatedly throughout the day. You should seek shade when your shadow is shorter than you. Protect yourself by wearing long sleeves, pants, a wide-brimmed hat, and sunglasses year round, whenever you are outdoors.  Once a month, do a whole body skin exam, using a mirror to look at the skin on your back. Tell your health care provider of new moles, moles that have irregular borders, moles that are larger than a pencil eraser, or moles that have changed in shape or color.  Stay current with required vaccines (immunizations).  Influenza vaccine. All adults should be immunized every year.  Tetanus, diphtheria, and acellular pertussis (Td, Tdap) vaccine. Pregnant women should receive  1 dose of Tdap vaccine during each pregnancy. The dose should be obtained regardless of the length of time since the last dose. Immunization is preferred during the 27th-36th week of gestation. An adult who has not previously received Tdap or who does not know her vaccine status should receive 1 dose of Tdap. This initial dose should be followed by tetanus and diphtheria toxoids (Td) booster doses every 10 years. Adults with an unknown or incomplete history of completing a 3-dose immunization series with Td-containing vaccines should begin or complete a primary immunization series including a Tdap dose. Adults should receive a Td booster every 10 years.    Zoster vaccine. One dose is recommended for adults aged 60 years or older unless certain conditions are present.    Pneumococcal 13-valent conjugate (PCV13) vaccine. When indicated, a person who is uncertain of her immunization history and has no record of immunization should receive the PCV13 vaccine. An adult aged 19 years or older who has certain medical conditions and has not been previously immunized should receive 1 dose of PCV13 vaccine. This PCV13 should be followed with a dose of pneumococcal polysaccharide (PPSV23) vaccine. The PPSV23 vaccine dose should be obtained at least 8 weeks after the dose of PCV13 vaccine. An adult aged 19 years or older who has certain medical conditions and previously received 1 or more doses of PPSV23 vaccine should receive 1 dose of PCV13. The PCV13 vaccine dose should be obtained 1 or more years after the last PPSV23 vaccine dose.    Pneumococcal polysaccharide (PPSV23) vaccine. When PCV13 is also indicated, PCV13 should be obtained first. All adults aged 65 years and older should be immunized. An adult younger than age 65 years who has certain medical conditions should be immunized. Any person who resides in a nursing home or long-term care facility should be immunized. An adult smoker should be immunized.  People with an immunocompromised condition and certain other conditions should receive both PCV13 and PPSV23 vaccines. People with human immunodeficiency virus (HIV) infection should be immunized as soon as possible after diagnosis. Immunization during chemotherapy or radiation therapy should be avoided. Routine use of PPSV23 vaccine is not recommended for American Indians, [place] Natives, or people younger than 65 years unless there are medical   conditions that require PPSV23 vaccine. When indicated, people who have unknown immunization and have no record of immunization should receive PPSV23 vaccine. One-time revaccination 5 years after the first dose of PPSV23 is recommended for people aged 19-64 years who have chronic kidney failure, nephrotic syndrome, asplenia, or immunocompromised conditions. People who received 1-2 doses of PPSV23 before age 9 years should receive another dose of PPSV23 vaccine at age 62 years or later if at least 5 years have passed since the previous dose. Doses of PPSV23 are not needed for people immunized with PPSV23 at or after age 53 years.   Preventive Services / Frequency  Ages 32 years and over  Blood pressure check.  Lipid and cholesterol check.  Lung cancer screening. / Every year if you are aged 71-80 years and have a 30-pack-year history of smoking and currently smoke or have quit within the past 15 years. Yearly screening is stopped once you have quit smoking for at least 15 years or develop a health problem that would prevent you from having lung cancer treatment.  Clinical breast exam.** / Every year after age 65 years.  BRCA-related cancer risk assessment.** / For women who have family members with a BRCA-related cancer (breast, ovarian, tubal, or peritoneal cancers).  Mammogram.** / Every year beginning at age 46 years and continuing for as long as you are in good health. Consult with your health care provider.  Pap test.** / Every 3 years starting at  age 71 years through age 47 or 50 years with 3 consecutive normal Pap tests. Testing can be stopped between 65 and 70 years with 3 consecutive normal Pap tests and no abnormal Pap or HPV tests in the past 10 years.  Fecal occult blood test (FOBT) of stool. / Every year beginning at age 44 years and continuing until age 73 years. You may not need to do this test if you get a colonoscopy every 10 years.  Flexible sigmoidoscopy or colonoscopy.** / Every 5 years for a flexible sigmoidoscopy or every 10 years for a colonoscopy beginning at age 37 years and continuing until age 2 years.  Hepatitis C blood test.** / For all people born from [birth year] through 1963 and any individual with known risks for hepatitis C.  Osteoporosis screening.** / A one-time screening for women ages 9 years and over and women at risk for fractures or osteoporosis.  Skin self-exam. / Monthly.  Influenza vaccine. / Every year.  Tetanus, diphtheria, and acellular pertussis (Tdap/Td) vaccine.** / 1 dose of Td every 10 years.  Zoster vaccine.** / 1 dose for adults aged 31 years or older.  Pneumococcal 13-valent conjugate (PCV13) vaccine.** / Consult your health care provider.  Pneumococcal polysaccharide (PPSV23) vaccine.** / 1 dose for all adults aged 32 years and older. Screening for abdominal aortic aneurysm (AAA)  by ultrasound is recommended for people who have history of high blood pressure or who are current or former smokers.

## 2015-02-21 NOTE — Progress Notes (Signed)
Patient ID: Catherine Wood, female   DOB: [birth year], 81 y.o.   MRN: [id number]  Annual Comprehensive Examination  This very nice 81 y.o. [facility] presents for complete physical.  Patient has been followed for HTN, T1_IDDM , Hyperlipidemia, and Vitamin D Deficiency.    HTN predates since 34. Patient's BP has been controlled at home and patient denies any cardiac symptoms as chest pain, palpitations, shortness of breath, dizziness or ankle swelling. Today's BP: (!) 126/56 mmHg    Patient's hyperlipidemia is controlled with diet and medications. Patient denies myalgias or other medication SE's. Last lipids were Chol 203; HDL 49; LDL  109*; Triglycerides 224 on 10/20/2014.    Patient has Morbid Obesity (BMI 33.46) and consequent T1_IDDM and related CKD predating since  2003 and patient denies reactive hypoglycemic symptoms, visual blurring, diabetic polys, or paresthesias. She reports CBG's range 100-140's.  Last A1c was not at goal with A1c 7.6% on 11/09/2014.       Finally, patient has history of Vitamin D Deficiency of 36 in 2013 and last Vitamin D was  55 on 10/20/2014.    Medication Sig  . TYLENOL #3 300-30 MG  Take 1 tablet by mouth every 6 (six) hours as needed for moderate pain.  Marland Kitchen aspirin 81 MG tablet Take 81 mg by mouth daily.  Marland Kitchen CALCIUM 600  Take 600 mg by mouth daily.  . canagliflozin (INVOKANA) 300 MG  Take 300 mg by mouth daily before breakfast.  . VITAMIN D 5000 UNITS Take 5,000 Units by mouth daily.   . fenofibrate 160 MG tablet TAKE 1 TABLET DAILY  . furosemide  40 MG tablet Take 1 to 2 tablets daily depending on swelling/edema.  . gabapentin  300 MG capsule 1 in the AM, 2 in the afternoon, and 2 at bed time  . HUMALOG MIX 75/25 KWIKPEN  INJECT 35 UNITS SQ EVERY MORNING AND 15 TO 20 UNITS EVERY EVENING AS D IRECTED  . LANTUS SOLOSTAR  Inject 36 Units into the skin 2 (two) times daily.   Marland Kitchen levothyroxine  100 MCG tablet Take 1 tablet (100 mcg total) by mouth daily before breakfast.  .  losartan  100 MG tablet TAKE 1 TABLET ONCE A DAY FOR HIGH BLOOD PRESSURE  . Magnesium 400 MG CAPS Take by mouth 4 (four) times daily.  . metFORMIN  1000 MG tablet TAKE (1) TABLET TWICE A DAY WITH MEALS (BREAKFAST AND SUPPER)  . ICAPS  Take by mouth daily.  Marland Kitchen FISH OIL 1000 MG  Take by mouth daily.  Marland Kitchen OVER THE COUNTER MEDICATION as needed. Macular Degeneration refresh eye drops  . pravastatin  40 MG tablet TAKE 1 TABLET DAILY FOR CHOLESTEROL  . ranitidine  300 MG capsule Take 300 mg by mouth every evening.  . triamterene-hctz  75-50  Take 1 tablet by mouth daily.  Marland Kitchen  HUMALOG 75/25 MIX)  Inject 31 Units into the skin 2 (two) times daily.  . predniSONE  10 MG tablet Take 2 tablets (20 mg total) by mouth 2 (two) times daily.   Allergies  Allergen Reactions  . Detrol [Tolterodine]    Past Medical History  Diagnosis Date  . Hypertension   . Hypothyroid   . IDDM (insulin dependent diabetes mellitus)   . Hyperlipidemia   . Macular degeneration   . Varicose veins   . Leg swelling   . Shortness of breath dyspnea     with exertion  . Sleep apnea     does not  wear CPAP  . GERD (gastroesophageal reflux disease)   . HOH (hard of hearing)   . Wears glasses    Health Maintenance  Topic Date Due  . COLONOSCOPY  09/12/1982  . TETANUS/TDAP  09/09/2013  . URINE MICROALBUMIN  12/31/2014  . FOOT EXAM  04/06/2015  . INFLUENZA VACCINE  04/10/2015  . OPHTHALMOLOGY EXAM  05/10/2015  . HEMOGLOBIN A1C  05/12/2015  . DEXA SCAN  Completed  . ZOSTAVAX  Completed  . PNA vac Low Risk Adult  Completed   Immunization History  Administered Date(s) Administered  . DT 10/20/2014  . Influenza-Unspecified 06/22/2013, 06/16/2014, 06/27/2014  . Pneumococcal Conjugate-13 04/05/2014  . Pneumococcal-Unspecified 09/09/2004  . Td 09/10/2003  . Zoster 09/09/2005   Past Surgical History  Procedure Laterality Date  . Biopsy thyroid      benign thyroid nodule removal  . Varicose vein surgery    . Cataract  extraction, bilateral    . Endometrial biopsy  2000  . Colonoscopy w/ biopsies and polypectomy    . Tubal ligation    . Anterior cervical decomp/discectomy fusion N/A 11/08/2014    Procedure: Cervical three-four,  Cervical six-seven anterior cervical decompression with fusion plating and bonegraft;  Surgeon: Ashok Pall, MD;  Location: [facility];  Service: Neurosurgery;  Laterality: N/A;  Cervical three-four,  Cervical six-seven anterior cervical decompression with fusion plating and bonegraft   Family History  Problem Relation Age of Onset  . Cancer Mother     colon  . Cancer Father     GI   History  Substance Use Topics  . Smoking status: Never Smoker   . Smokeless tobacco: Never Used  . Alcohol Use: No    ROS Constitutional: Denies fever, chills, weight loss/gain, headaches, insomnia,  night sweats, and change in appetite. Does c/o fatigue. Eyes: Denies redness, blurred vision, diplopia, discharge, itchy, watery eyes.  ENT: Denies discharge, congestion, post nasal drip, epistaxis, sore throat, earache, hearing loss, dental pain, Tinnitus, Vertigo, Sinus pain, snoring.  Cardio: Denies chest pain, palpitations, irregular heartbeat, syncope, dyspnea, diaphoresis, orthopnea, PND, claudication, edema Respiratory: denies cough, dyspnea, DOE, pleurisy, hoarseness, laryngitis, wheezing.  Gastrointestinal: Denies dysphagia, heartburn, reflux, water brash, pain, cramps, nausea, vomiting, bloating, diarrhea, constipation, hematemesis, melena, hematochezia, jaundice, hemorrhoids Genitourinary: Denies dysuria, frequency, urgency, nocturia, hesitancy, discharge, hematuria, flank pain Breast: Breast lumps, nipple discharge, bleeding.  Musculoskeletal: Denies arthralgia, myalgia, stiffness, Jt. Swelling, pain, limp, and strain/sprain. Denies falls. Skin: Denies puritis, rash, hives, warts, acne, eczema, changing in skin lesion Neuro: No weakness, tremor, incoordination, spasms, paresthesia,  pain Psychiatric: Denies confusion, memory loss, sensory loss. Denies Depression. Endocrine: Denies change in weight, skin, hair change, nocturia, and paresthesia, diabetic polys, visual blurring, hyper / hypo glycemic episodes.  Heme/Lymph: No excessive bleeding, bruising, enlarged lymph nodes.  Physical Exam  BP 126/56   Pulse 76  Temp 97.3 F   Resp 16  Ht 5' 4.5"  Wt 197 lb 14.4 oz     BMI 33.46   General Appearance: OVER nourished and in no apparent distress. Eyes: PERRLA, EOMs, conjunctiva no swelling or erythema, normal fundi and vessels. Sinuses: No frontal/maxillary tenderness ENT/Mouth: EACs patent / TMs  nl. Nares clear without erythema, swelling, mucoid exudates. Oral hygiene is good. No erythema, swelling, or exudate. Tongue normal, non-obstructing. Tonsils not swollen or erythematous. Hearing normal.  Neck: Supple, thyroid normal. No bruits, nodes or JVD. Respiratory: Respiratory effort normal.  BS equal and clear bilateral without rales, rhonci, wheezing or stridor. Cardio: Heart sounds  are normal with regular rate and rhythm and no murmurs, rubs or gallops. Peripheral pulses are normal and equal bilaterally without edema. No aortic or femoral bruits. Chest: symmetric with normal excursions and percussion. Breasts: Symmetric, without lumps, nipple discharge, retractions, or fibrocystic changes.  Abdomen: Flat, soft, with bowl sounds. Nontender, no guarding, rebound, hernias, masses, or organomegaly.  Lymphatics: Non tender without lymphadenopathy.  Musculoskeletal: Full ROM all peripheral extremities, joint stability, 5/5 strength, and normal gait. Skin: Warm and dry without rashes, lesions, cyanosis, clubbing or  ecchymosis.  Neuro: Cranial nerves intact, as are DTR's in UE, but DTR's are absent in the lower extremities. Normal muscle tone, no cerebellar symptoms. Sensation intact by Monofilament testing to the toes bilaterally.  Pysch: Awake and oriented X 3, normal  affect, Insight and Judgment appropriate.   Assessment and Plan  1. Essential hypertension  - EKG 12-Lead - [place], RETROPERITNL ABD,  LTD  2. Hyperlipidemia  - Lipid panel  3. Type 1 diabetes mellitus with nephropathy  - Microalbumin / creatinine urine ratio - HM DIABETES FOOT EXAM - LOW EXTREMITY NEUR EXAM DOCUM - Hemoglobin A1c  4. Vitamin D deficiency  - Vit D  25 hydroxy   5. Hypothyroidism, unspecified hypothyroidism type  - TSH  6. Obesity   7. CKD (chronic kidney disease) stage 3, GFR 30-59 ml/min   8. Macular degeneration   9. DDD (degenerative disc disease), lumbar   10. DDD (degenerative disc disease), cervical  - s/p  ACDF x 2 (Mar 1st 2016)  11. Medication management  - Urine Microscopic - CBC with Differential/Platelet - BASIC METABOLIC PANEL WITH GFR - Hepatic function panel - Magnesium  12. Screening for rectal cancer  - POC Hemoccult Bld/Stl   13. Depression screen  - Screen Negative  14. At low risk for fall   Continue prudent diet as discussed, weight control, BP monitoring, regular exercise, and medications. Discussed med's effects and SE's. Screening labs and tests as requested with regular follow-up as recommended.  Over 40 minutes of exam, counseling, chart review was performed.

## 2015-02-22 LAB — HEPATIC FUNCTION PANEL
ALT: 18 U/L (ref 0–35)
AST: 20 U/L (ref 0–37)
Albumin: 4.3 g/dL (ref 3.5–5.2)
Alkaline Phosphatase: 51 U/L (ref 39–117)
BILIRUBIN DIRECT: 0.1 mg/dL (ref 0.0–0.3)
Indirect Bilirubin: 0.2 mg/dL (ref 0.2–1.2)
Total Bilirubin: 0.3 mg/dL (ref 0.2–1.2)
Total Protein: 7 g/dL (ref 6.0–8.3)

## 2015-02-22 LAB — BASIC METABOLIC PANEL WITH GFR
BUN: 30 mg/dL — ABNORMAL HIGH (ref 6–23)
CALCIUM: 10.2 mg/dL (ref 8.4–10.5)
CO2: 32 mEq/L (ref 19–32)
CREATININE: 1.02 mg/dL (ref 0.50–1.10)
Chloride: 100 mEq/L (ref 96–112)
GFR, EST AFRICAN AMERICAN: 59 mL/min — AB
GFR, EST NON AFRICAN AMERICAN: 51 mL/min — AB
Glucose, Bld: 41 mg/dL — CL (ref 70–99)
Potassium: 4.2 mEq/L (ref 3.5–5.3)
Sodium: 140 mEq/L (ref 135–145)

## 2015-02-22 LAB — URINALYSIS, MICROSCOPIC ONLY
Casts: NONE SEEN
Crystals: NONE SEEN
Squamous Epithelial / LPF: NONE SEEN

## 2015-02-22 LAB — MICROALBUMIN / CREATININE URINE RATIO
Creatinine, Urine: 50.7 mg/dL
Microalb Creat Ratio: 11.8 mg/g (ref 0.0–30.0)
Microalb, Ur: 0.6 mg/dL (ref <2.0)

## 2015-02-22 LAB — HEMOGLOBIN A1C
Hgb A1c MFr Bld: 6.9 % — ABNORMAL HIGH (ref <5.7)
MEAN PLASMA GLUCOSE: 151 mg/dL — AB (ref <117)

## 2015-02-22 LAB — LIPID PANEL
CHOL/HDL RATIO: 3.8 ratio
Cholesterol: 192 mg/dL (ref 0–200)
HDL: 51 mg/dL (ref >=46)
LDL CALC: 94 mg/dL (ref 0–99)
Triglycerides: 237 mg/dL — ABNORMAL HIGH (ref <150)
VLDL: 47 mg/dL — AB (ref 0–40)

## 2015-02-22 LAB — MAGNESIUM: Magnesium: 2 mg/dL (ref 1.5–2.5)

## 2015-02-22 LAB — VITAMIN D 25 HYDROXY (VIT D DEFICIENCY, FRACTURES): VIT D 25 HYDROXY: 41 ng/mL (ref 30–100)

## 2015-02-22 LAB — TSH: TSH: 2.246 u[IU]/mL (ref 0.350–4.500)

## 2015-03-02 DIAGNOSIS — E1142 Type 2 diabetes mellitus with diabetic polyneuropathy: Secondary | ICD-10-CM | POA: Diagnosis not present

## 2015-03-02 DIAGNOSIS — L84 Corns and callosities: Secondary | ICD-10-CM | POA: Diagnosis not present

## 2015-03-02 DIAGNOSIS — B351 Tinea unguium: Secondary | ICD-10-CM | POA: Diagnosis not present

## 2015-03-07 ENCOUNTER — Other Ambulatory Visit (INDEPENDENT_AMBULATORY_CARE_PROVIDER_SITE_OTHER): Payer: Medicare Other

## 2015-03-07 DIAGNOSIS — Z1212 Encounter for screening for malignant neoplasm of rectum: Secondary | ICD-10-CM

## 2015-03-07 LAB — POC HEMOCCULT BLD/STL (HOME/3-CARD/SCREEN)
Card #2 Fecal Occult Blod, POC: NEGATIVE
Card #3 Fecal Occult Blood, POC: NEGATIVE
Fecal Occult Blood, POC: NEGATIVE

## 2015-03-15 ENCOUNTER — Other Ambulatory Visit: Payer: Self-pay | Admitting: Internal Medicine

## 2015-03-27 ENCOUNTER — Other Ambulatory Visit: Payer: Self-pay | Admitting: Physician Assistant

## 2015-03-27 ENCOUNTER — Other Ambulatory Visit: Payer: Self-pay | Admitting: Internal Medicine

## 2015-03-30 DIAGNOSIS — H3532 Exudative age-related macular degeneration: Secondary | ICD-10-CM | POA: Diagnosis not present

## 2015-04-03 ENCOUNTER — Other Ambulatory Visit: Payer: Self-pay

## 2015-04-03 DIAGNOSIS — Z1231 Encounter for screening mammogram for malignant neoplasm of breast: Secondary | ICD-10-CM

## 2015-04-10 ENCOUNTER — Other Ambulatory Visit: Payer: Self-pay | Admitting: *Deleted

## 2015-04-10 MED ORDER — ACCU-CHEK AVIVA DEVI: Status: AC

## 2015-04-10 MED ORDER — ACCU-CHEK AVIVA DEVI: Status: DC

## 2015-04-10 MED ORDER — ACCU-CHEK SOFT TOUCH LANCETS MISC: Status: DC

## 2015-04-10 MED ORDER — GLUCOSE BLOOD VI STRP: ORAL_STRIP | Status: DC

## 2015-04-12 DIAGNOSIS — Z86010 Personal history of colonic polyps: Secondary | ICD-10-CM | POA: Diagnosis not present

## 2015-04-12 DIAGNOSIS — Z09 Encounter for follow-up examination after completed treatment for conditions other than malignant neoplasm: Secondary | ICD-10-CM | POA: Diagnosis not present

## 2015-04-20 ENCOUNTER — Ambulatory Visit (INDEPENDENT_AMBULATORY_CARE_PROVIDER_SITE_OTHER): Payer: Medicare Other | Admitting: Internal Medicine

## 2015-04-20 ENCOUNTER — Encounter: Payer: Self-pay | Admitting: Internal Medicine

## 2015-04-20 VITALS — BP 128/56 | HR 80 | Temp 97.5°F | Resp 16 | Ht 64.0 in | Wt 201.0 lb

## 2015-04-20 DIAGNOSIS — D485 Neoplasm of uncertain behavior of skin: Secondary | ICD-10-CM

## 2015-04-20 DIAGNOSIS — I10 Essential (primary) hypertension: Secondary | ICD-10-CM

## 2015-04-20 DIAGNOSIS — L820 Inflamed seborrheic keratosis: Secondary | ICD-10-CM | POA: Diagnosis not present

## 2015-04-20 DIAGNOSIS — L723 Sebaceous cyst: Secondary | ICD-10-CM

## 2015-04-20 NOTE — Progress Notes (Signed)
Subjective:    Patient ID: Catherine Wood, female    DOB: 03-13-1931, 81 y.o.   MRN: [id number]  HPI Very nice 81 yo WWF  With HTN, T2_IDDM, HLD, Hypothyroidism presents with various c/o's. HTN apparently controlled with neg CV ROS. Patient has a non-healing wound of her left mid shin. Also, she has several areas of irritated seborrheic keratoses about her face a, scalp, and neck and a sebaceous cyst of her right neck.   Medication Sig  . TYLENOL #3 Take 1 tablet by mouth every 6 (six) hours as needed for moderate pain.  Marland Kitchen aspirin 81 MG tablet Take 81 mg by mouth daily.  Marland Kitchen CALCIUM 600  Take 600 mg by mouth daily.  . canagliflozin (INVOKANA) 300 MG  Take 300 mg by mouth daily before breakfast.  . VITAMIN D 5000 UNITS Take 5,000 Units by mouth daily.   . fenofibrate 160 MG tablet TAKE 1 TABLET DAILY  . furosemide40 MG tablet Take 1 to 2 tablets daily depending on swelling/edema.  . gabapentin  300 MG capsule 1 in the AM, 2 in the afternoon, and 2 at bed time  . HUMALOG MIX 75/25 KWIKPEN  35 UNITS EVERY MORNING AND 15 TO 20 UNITS EVERY EVENING AS DIRECTED  . LANTUS  100 UNIT/ML Solostar Pen Inject 36 Units into the skin 2 (two) times daily.  . Levothyroxine 100 MCG tablet Take 1 tablet (100 mcg total) by mouth daily before breakfast.  . losartan  100 MG tablet TAKE 1 TABLET ONCE A DAY FOR HIGH BLOOD PRESSURE  . Magnesium 400 MG CAPS Take by mouth 4 (four) times daily.  . metFORMIN  1000 MG tablet TAKE (1) TABLET TWICE A DAY WITH MEALS (BREAKFAST AND SUPPER)  . ICAPS  Take by mouth daily.  Marland Kitchen FISH OIL 1000 MG CAPS Take by mouth daily.  . Macular Degeneration refresh eye drops as needed.   . pravastatin  40 MG tablet TAKE 1 TABLET DAILY FOR CHOLESTEROL  . ranitidine ( 300 MG capsule Take 300 mg by mouth every evening.  . cyclobenzaprine  10 MG tablet Take 1 tablet (10 mg total) by mouth 3 (three) times daily as needed for muscle spasms. (Patient not taking: Reported on 02/21/2015)  .  triamterene-hctz (MAXZIDE) 75-50 Take 1 tablet by mouth daily.   Allergies  Allergen Reactions  . Detrol [Tolterodine]    Past Medical History  Diagnosis Date  . Hypertension   . Hypothyroid   . IDDM (insulin dependent diabetes mellitus)   . Hyperlipidemia   . Macular degeneration   . Varicose veins   . Leg swelling   . Shortness of breath dyspnea     with exertion  . Sleep apnea     does not wear CPAP  . GERD (gastroesophageal reflux disease)   . HOH (hard of hearing)   . Wears glasses    Past Surgical History  Procedure Laterality Date  . Biopsy thyroid      benign thyroid nodule removal  . Varicose vein surgery    . Cataract extraction, bilateral    . Endometrial biopsy  2000  . Colonoscopy w/ biopsies and polypectomy    . Tubal ligation    . Anterior cervical decomp/discectomy fusion N/A 11/08/2014    Procedure: Cervical three-four,  Cervical six-seven anterior cervical decompression with fusion plating and bonegraft;  Surgeon: Ashok Pall, MD;  Location: [facility];  Service: Neurosurgery;  Laterality: N/A;  Cervical three-four,  Cervical six-seven anterior  cervical decompression with fusion plating and bonegraft  . Spine surgery Left  11/08/2014    Left ACDF    Review of Systems 10 point systems review negative except as above.    Objective:   Physical Exam  BP 128/56 mmHg  Pulse 80  Temp(Src) 97.5 F (36.4 C)  Resp 16  Ht 5\' 4"  (1.626 m)  Wt 201 lb (91.173 kg)  BMI 34.48 kg/m2  HEENT - Eac's patent. TM's Nl. EOM's full. PERRLA. NasoOroPharynx clear. Neck - supple. Nl Thyroid. Carotids 2+ & No bruits, nodes, JVD Chest - Clear equal BS w/o Rales, rhonchi, wheezes. Cor - Nl HS. RRR w/o sig MGR. PP 1(+). No edema. Abd - No palpable organomegaly, masses or tenderness. BS nl. MS- FROM w/o deformities. Muscle power, tone and bulk Nl. Gait Nl. Neuro - No obvious Cr N abnormalities. Sensory, motor and Cerebellar functions appear Nl w/o focal  abnormalities. Psyche - Mental status normal & appropriate.  No delusions, ideations or obvious mood abnormalities.\  Skin - (1) ~12 mm x 12 mm crusted superficial raw lesion of left mid shin.  (2) (Procedure 11420) - 6 mm x 34mm x 6 mm sebaceous cyst of the lower right lateral neck. After informed consent & aseptic prep and local anesthesia, the sebaceous cyst was sharply dissected free with a #11 scalpel  & delivered. The cyst cavity was fulgurated by hyfrecation for hemostasis.  Then Bacitracin ung was applied and covered with a sterile bandage.  (3) (Procedure(s) 17000 & 17003 x #5) - multiple irritated seborrheic keratoses (#6) measuring ~ 4-5 x 4 -5 mm of the left pre- & post - auricular areas , left lateral neck, left frontal scalp-line, right frontal scalp-line and middle upper back were treated with liq Nitrogen cryotherapy with double & triple freeze-thaw technique  Patient was instructed in post procedure wound care.     Assessment & Plan:   1. Essential hypertension   2. Sebaceous cyst  - Excised  3. Seborrheic keratoses, inflamed  - Tx'd by Cryosurg   4. Non healing wound, left shin  - refer [place] for consideration for bx if concern for [facility]

## 2015-04-24 ENCOUNTER — Other Ambulatory Visit: Payer: Self-pay | Admitting: Physician Assistant

## 2015-04-25 DIAGNOSIS — M545 Low back pain: Secondary | ICD-10-CM | POA: Diagnosis not present

## 2015-04-25 DIAGNOSIS — M5136 Other intervertebral disc degeneration, lumbar region: Secondary | ICD-10-CM | POA: Diagnosis not present

## 2015-04-25 DIAGNOSIS — Z79891 Long term (current) use of opiate analgesic: Secondary | ICD-10-CM | POA: Diagnosis not present

## 2015-05-01 ENCOUNTER — Encounter: Payer: Self-pay | Admitting: Internal Medicine

## 2015-05-02 DIAGNOSIS — H938X3 Other specified disorders of ear, bilateral: Secondary | ICD-10-CM | POA: Diagnosis not present

## 2015-05-04 DIAGNOSIS — H3532 Exudative age-related macular degeneration: Secondary | ICD-10-CM | POA: Diagnosis not present

## 2015-05-11 DIAGNOSIS — B351 Tinea unguium: Secondary | ICD-10-CM | POA: Diagnosis not present

## 2015-05-11 DIAGNOSIS — E1142 Type 2 diabetes mellitus with diabetic polyneuropathy: Secondary | ICD-10-CM | POA: Diagnosis not present

## 2015-05-11 DIAGNOSIS — L84 Corns and callosities: Secondary | ICD-10-CM | POA: Diagnosis not present

## 2015-05-16 ENCOUNTER — Encounter: Payer: Self-pay | Admitting: Physician Assistant

## 2015-05-16 ENCOUNTER — Ambulatory Visit (INDEPENDENT_AMBULATORY_CARE_PROVIDER_SITE_OTHER): Payer: Medicare Other | Admitting: Physician Assistant

## 2015-05-16 VITALS — BP 150/60 | HR 78 | Temp 97.7°F | Resp 18 | Ht 64.0 in | Wt 201.6 lb

## 2015-05-16 DIAGNOSIS — J069 Acute upper respiratory infection, unspecified: Secondary | ICD-10-CM | POA: Diagnosis not present

## 2015-05-16 MED ORDER — AZITHROMYCIN 250 MG PO TABS: ORAL_TABLET | ORAL | Status: AC

## 2015-05-16 MED ORDER — PREDNISONE 20 MG PO TABS
ORAL_TABLET | ORAL | Status: DC
Start: 2015-05-16 — End: 2015-05-31

## 2015-05-16 MED ORDER — PROMETHAZINE-CODEINE 6.25-10 MG/5ML PO SYRP: 5.0000 mL | ORAL_SOLUTION | Freq: Four times a day (QID) | ORAL | Status: DC | PRN

## 2015-05-16 NOTE — Progress Notes (Signed)
Subjective:    Patient ID: Catherine Wood, female    DOB: 01-Jan-1931, 81 y.o.   MRN: [id number]  HPI 81 y.o. WF presents with URI.  Symptoms include bilateral ear pressure/pain, congestion, nasal congestion, no  fever, productive cough with  white colored sputum, sinus pressure and wheezing. Onset of symptoms was 1 week ago, and has been gradually worsening since that time. Treatment to date: antihistamines and cough suppressants and tylenol.   Blood pressure 150/60, pulse 78, temperature 97.7 F (36.5 C), resp. rate 18, height 5\' 4"  (1.626 m), weight 201 lb 9.6 oz (91.445 kg).  Past Medical History  Diagnosis Date  . Hypertension   . Hypothyroid   . IDDM (insulin dependent diabetes mellitus)   . Hyperlipidemia   . Macular degeneration   . Varicose veins   . Leg swelling   . Shortness of breath dyspnea     with exertion  . Sleep apnea     does not wear CPAP  . GERD (gastroesophageal reflux disease)   . HOH (hard of hearing)   . Wears glasses    Current Outpatient Prescriptions on File Prior to Visit  Medication Sig Dispense Refill  . acetaminophen-codeine (TYLENOL #3) 300-30 MG per tablet Take 1 tablet by mouth every 6 (six) hours as needed for moderate pain. 60 tablet 0  . aspirin 81 MG tablet Take 81 mg by mouth daily.    . Blood Glucose Monitoring Suppl (ACCU-CHEK AVIVA) device Check blood sugar 2 times daily-DX-E11.9 1 each 0  . Calcium Carbonate (CALCIUM 600 PO) Take 600 mg by mouth daily.    . canagliflozin (INVOKANA) 300 MG TABS tablet Take 300 mg by mouth daily before breakfast. 30 tablet 6  . Cholecalciferol (VITAMIN D3) 5000 UNITS CAPS Take 5,000 Units by mouth daily.     . fenofibrate 160 MG tablet TAKE 1 TABLET DAILY 30 tablet PRN  . furosemide (LASIX) 40 MG tablet Take 1 to 2 tablets daily depending on swelling/edema. 60 tablet 3  . gabapentin (NEURONTIN) 300 MG capsule TAKE 1 CAPSULE EACH MORNING, 2 EACH AFTERNOON, AND 2 AT BEDTIME 150 capsule 0  . glucose blood  (ACCU-CHEK AVIVA PLUS) test strip Check blood sugar 2 times daily-DX-E11.9. 100 each 6  . HUMALOG MIX 75/25 KWIKPEN (75-25) 100 UNIT/ML Kwikpen 35 UNITS EVERY MORNING AND 15 TO 20 UNITS EVERY EVENING AS DIRECTED 15 mL 99  . Insulin Glargine (LANTUS SOLOSTAR [place]) Inject 36 Units into the skin 2 (two) times daily.     . Insulin Pen Needle (PEN NEEDLES 29GX1/2") 29G X 12MM MISC USE 4 TIMES A DAY 100 each 99  . Lancets (ACCU-CHEK SOFT TOUCH) lancets Check blood sugar 2 times daily-DX-E11.9. 100 each 6  . LANTUS SOLOSTAR 100 UNIT/ML Solostar Pen     . levothyroxine (SYNTHROID, LEVOTHROID) 100 MCG tablet Take 1 tablet (100 mcg total) by mouth daily before breakfast. 90 tablet 1  . losartan (COZAAR) 100 MG tablet TAKE 1 TABLET ONCE A DAY FOR HIGH BLOOD PRESSURE 90 tablet 3  . Magnesium 400 MG CAPS Take by mouth 4 (four) times daily.    . metFORMIN (GLUCOPHAGE) 1000 MG tablet TAKE (1) TABLET TWICE A DAY WITH MEALS (BREAKFAST AND SUPPER) 180 tablet 2  . Multiple Vitamins-Minerals (ICAPS PO) Take by mouth daily.    . Omega-3 Fatty Acids (FISH OIL) 1000 MG CAPS Take by mouth daily.    Marland Kitchen OVER THE COUNTER MEDICATION as needed. Macular Degeneration refresh eye drops    .  pravastatin (PRAVACHOL) 40 MG tablet TAKE 1 TABLET DAILY FOR CHOLESTEROL 90 tablet 3  . ranitidine (ZANTAC) 300 MG capsule Take 300 mg by mouth every evening.     No current facility-administered medications on file prior to visit.   Review of Systems  Constitutional: Positive for fatigue. Negative for fever and chills.  HENT: Positive for congestion, rhinorrhea, sinus pressure and sore throat. Negative for dental problem, ear discharge, ear pain, nosebleeds, trouble swallowing and voice change.   Respiratory: Positive for cough, chest tightness and wheezing. Negative for apnea, choking, shortness of breath and stridor.   Cardiovascular: Negative.   Gastrointestinal: Negative.   Genitourinary: Negative.   Musculoskeletal: Negative.    Neurological: Negative.        Objective:   Physical Exam  Constitutional: She is oriented to person, place, and time. She appears well-developed and well-nourished.  HENT:  Head: Normocephalic and atraumatic.  Right Ear: External ear normal.  Left Ear: External ear normal.  Nose: Nose normal.  Mouth/Throat: Oropharynx is clear and moist.  Eyes: Conjunctivae are normal. Pupils are equal, round, and reactive to light.  Neck: Normal range of motion. Neck supple.  Cardiovascular: Normal rate and regular rhythm.   Pulmonary/Chest: Effort normal. No respiratory distress. She has wheezes (left lower lobe greater than right). She has no rales. She exhibits no tenderness.  Abdominal: Soft. Bowel sounds are normal.  Lymphadenopathy:    She has no cervical adenopathy.  Neurological: She is alert and oriented to person, place, and time.  Skin: Skin is warm and dry.      Assessment & Plan:  Upper respiratory tract infection - Plan: azithromycin (ZITHROMAX) 250 MG tablet, predniSONE (DELTASONE) 20 MG tablet, promethazine-codeine (PHENERGAN WITH CODEINE) 6.25-10 MG/5ML syrup

## 2015-05-16 NOTE — Patient Instructions (Signed)
I will give you a prescription for an antibiotic, but please only take it if you are not feeling better in 7-10 days.  Bronchitis is mostly caused by viruses and the antibiotic will do nothing.  PLEASE TRY TO DO OVER THE COUNTER TREATMENT AND/OR PREDNISONE FOR 5-7 DAYS AND IF YOU ARE NOT GETTING BETTER OR GETTING WORSE THEN YOU CAN START ON AN ANTIBIOTIC GIVEN.  Can take the prednisone AT NIGHT WITH DINNER, it take 8-12 hours to start working so it will NOT affect your sleeping if you take it at night with your food!! Take two pills the first night and 1 or two pill the second night and then 1 pill the other nights.    Rest and stay hydrated.  Make sure you drink plenty of fluids to make sure urine is clear when you urinate.  Water will help thin out mucous. - Take Mucinex DM- Maximum Strength over the counter to thin out and cough up the thick mucous.  Please follow directions on box. -Take Albuterol if prescribed.  Risk of antibiotic use: About 1 in 4 people who take antibiotics have side effects including stomach problems, dizziness, or rashes. Those problems clear up soon after stopping the drugs, but in rare cases antibiotics can cause severe allergic reaction. Over use of antibiotics also encourages the growth of bacteria that can't be controlled easily with drugs. That makes you more vunerable to antibiotic-resistant infections and undermines the benefits of antibiotics for others.   Waste of Money: Antibiotics often aren't very expensive, but any money spent on unnecessary drugs is money down the drain.   When are antibiotics needed? Only when symptoms last longer than a week.  Start to improve but then worsen again  Please call the office or message through My Chart if you have any questions.   Acute Bronchitis Bronchitis is when the airways that extend from the windpipe into the lungs get red, puffy, and painful (inflamed). Bronchitis often causes thick spit (mucus) to develop. This  leads to a cough. A cough is the most common symptom of bronchitis. In acute bronchitis, the condition usually begins suddenly and goes away over time (usually in 2 weeks). Smoking, allergies, and asthma can make bronchitis worse. Repeated episodes of bronchitis may cause more lung problems.  Most common cause of Bronchitis is viruses (rhinovirus, coronavirus, RSV).  Therefore, not requiring an antibiotic; as antibiotics only treat bacterial infections.  HOME CARE  Rest.  Drink enough fluids to keep your pee (urine) clear or pale yellow (unless you need to limit fluids as told by your doctor).  Only take over-the-counter or prescription medicines as told by your doctor.  Avoid smoking and secondhand smoke. These can make bronchitis worse. If you are a smoker, think about using nicotine gum or skin patches. Quitting smoking will help your lungs heal faster.  Reduce the chance of getting bronchitis again by:  Washing your hands often.  Avoiding people with cold symptoms.  Trying not to touch your hands to your mouth, nose, or eyes.  Follow up with your doctor as told. GET HELP IF: Your symptoms do not improve after 1 week of treatment. Symptoms include:  Cough.  Fever.  Coughing up thick spit.  Body aches.  Chest congestion.  Chills.  Shortness of breath.  Sore throat. GET HELP RIGHT AWAY IF:   You have an increased fever.  You have chills.  You have severe shortness of breath.  You have bloody thick spit (sputum).    You throw up (vomit) often.  You lose too much body fluid (dehydration).  You have a severe headache.  You faint. MAKE SURE YOU:   Understand these instructions.  Will watch your condition.  Will get help right away if you are not doing well or get worse. Document Released: 02/12/2008 Document Revised: 04/28/2013 Document Reviewed: 02/16/2013 [employer] Patient Information 2015 [employer]. This information is not intended to replace  advice given to you by your health care provider. Make sure you discuss any questions you have with your health care provider.   

## 2015-05-17 DIAGNOSIS — L821 Other seborrheic keratosis: Secondary | ICD-10-CM | POA: Diagnosis not present

## 2015-05-17 DIAGNOSIS — D485 Neoplasm of uncertain behavior of skin: Secondary | ICD-10-CM | POA: Diagnosis not present

## 2015-05-17 DIAGNOSIS — C44719 Basal cell carcinoma of skin of left lower limb, including hip: Secondary | ICD-10-CM | POA: Diagnosis not present

## 2015-05-24 DIAGNOSIS — M5136 Other intervertebral disc degeneration, lumbar region: Secondary | ICD-10-CM | POA: Diagnosis not present

## 2015-05-31 ENCOUNTER — Ambulatory Visit (INDEPENDENT_AMBULATORY_CARE_PROVIDER_SITE_OTHER): Payer: Medicare Other | Admitting: Internal Medicine

## 2015-05-31 VITALS — BP 128/56 | HR 82 | Temp 98.2°F | Resp 18 | Ht 64.0 in | Wt 199.0 lb

## 2015-05-31 DIAGNOSIS — Z79899 Other long term (current) drug therapy: Secondary | ICD-10-CM | POA: Diagnosis not present

## 2015-05-31 DIAGNOSIS — E039 Hypothyroidism, unspecified: Secondary | ICD-10-CM

## 2015-05-31 DIAGNOSIS — N183 Chronic kidney disease, stage 3 unspecified: Secondary | ICD-10-CM

## 2015-05-31 DIAGNOSIS — E559 Vitamin D deficiency, unspecified: Secondary | ICD-10-CM

## 2015-05-31 DIAGNOSIS — E785 Hyperlipidemia, unspecified: Secondary | ICD-10-CM

## 2015-05-31 DIAGNOSIS — I10 Essential (primary) hypertension: Secondary | ICD-10-CM

## 2015-05-31 LAB — CBC WITH DIFFERENTIAL/PLATELET
BASOS ABS: 0.1 10*3/uL (ref 0.0–0.1)
Basophils Relative: 1 % (ref 0–1)
EOS ABS: 0.5 10*3/uL (ref 0.0–0.7)
Eosinophils Relative: 4 % (ref 0–5)
HCT: 40.9 % (ref 36.0–46.0)
Hemoglobin: 13.2 g/dL (ref 12.0–15.0)
Lymphocytes Relative: 21 % (ref 12–46)
Lymphs Abs: 2.6 10*3/uL (ref 0.7–4.0)
MCH: 27.1 pg (ref 26.0–34.0)
MCHC: 32.3 g/dL (ref 30.0–36.0)
MCV: 84 fL (ref 78.0–100.0)
MPV: 10.6 fL (ref 8.6–12.4)
Monocytes Absolute: 0.9 10*3/uL (ref 0.1–1.0)
Monocytes Relative: 7 % (ref 3–12)
NEUTROS PCT: 67 % (ref 43–77)
Neutro Abs: 8.2 10*3/uL — ABNORMAL HIGH (ref 1.7–7.7)
PLATELETS: 312 10*3/uL (ref 150–400)
RBC: 4.87 MIL/uL (ref 3.87–5.11)
RDW: 14.5 % (ref 11.5–15.5)
WBC: 12.2 10*3/uL — AB (ref 4.0–10.5)

## 2015-05-31 NOTE — Progress Notes (Signed)
Patient ID: Catherine Wood, female   DOB: November 03, 1930, 81 y.o.   MRN: [id number]  Assessment and Plan:  Hypertension:  -Continue medication -monitor blood pressure at home. -Continue DASH diet -Reminder to go to the ER if any CP, SOB, nausea, dizziness, severe HA, changes vision/speech, left arm numbness and tingling and jaw pain.  Cholesterol - Continue diet and exercise -Check cholesterol.   Diabetes with diabetic chronic kidney disease -Continue diet and exercise.  -Check A1C  Vitamin D Def -check level -continue medications.   Continue diet and meds as discussed. Further disposition pending results of labs. Discussed med's effects and SE's.    HPI 81 y.o. female  presents for 3 month follow up with hypertension, hyperlipidemia, diabetes and vitamin D deficiency.   Her blood pressure has been controlled at home, today their BP is BP: (!) 128/56 mmHg.She does not workout. She denies chest pain, shortness of breath, dizziness.   She is on cholesterol medication and denies myalgias. Her cholesterol is at goal. The cholesterol was:  02/21/2015: Cholesterol 192; HDL 51; LDL Cholesterol 94; Triglycerides 237*   She has been working on diet and exercise for diabetes with diabetic chronic kidney disease, she is on bASA, she is on ACE/ARB, and denies  foot ulcerations, hyperglycemia, hypoglycemia , increased appetite, nausea, paresthesia of the feet, polydipsia, polyuria, visual disturbances, vomiting and weight loss. Last A1C was: 02/21/2015: Hgb A1c MFr Bld 6.9*.  Patient reports that her blood sugar was 80 this morning.  She reports that her BS was 117 after eating.     Patient is on Vitamin D supplement. 02/21/2015: Vit D, 25-Hydroxy 41   Patient reports that she is going to have skin surgery performed tomorrow on her left shin.  She reports that it is not malignant but the dermatologist did recommend that she have the spot removed.    Current Medications:  Current Outpatient Prescriptions  on File Prior to Visit  Medication Sig Dispense Refill  . acetaminophen-codeine (TYLENOL #3) 300-30 MG per tablet Take 1 tablet by mouth every 6 (six) hours as needed for moderate pain. 60 tablet 0  . aspirin 81 MG tablet Take 81 mg by mouth daily.    . Blood Glucose Monitoring Suppl (ACCU-CHEK AVIVA) device Check blood sugar 2 times daily-DX-E11.9 1 each 0  . Calcium Carbonate (CALCIUM 600 PO) Take 600 mg by mouth daily.    . canagliflozin (INVOKANA) 300 MG TABS tablet Take 300 mg by mouth daily before breakfast. 30 tablet 6  . Cholecalciferol (VITAMIN D3) 5000 UNITS CAPS Take 5,000 Units by mouth daily.     . fenofibrate 160 MG tablet TAKE 1 TABLET DAILY 30 tablet PRN  . furosemide (LASIX) 40 MG tablet Take 1 to 2 tablets daily depending on swelling/edema. 60 tablet 3  . gabapentin (NEURONTIN) 300 MG capsule TAKE 1 CAPSULE EACH MORNING, 2 EACH AFTERNOON, AND 2 AT BEDTIME 150 capsule 0  . glucose blood (ACCU-CHEK AVIVA PLUS) test strip Check blood sugar 2 times daily-DX-E11.9. 100 each 6  . HUMALOG MIX 75/25 KWIKPEN (75-25) 100 UNIT/ML Kwikpen 35 UNITS EVERY MORNING AND 15 TO 20 UNITS EVERY EVENING AS DIRECTED 15 mL 99  . Insulin Glargine (LANTUS SOLOSTAR [place]) Inject 36 Units into the skin 2 (two) times daily.     . Insulin Pen Needle (PEN NEEDLES 29GX1/2") 29G X 12MM MISC USE 4 TIMES A DAY 100 each 99  . Lancets (ACCU-CHEK SOFT TOUCH) lancets Check blood sugar 2 times daily-DX-E11.9. 100 each  6  . LANTUS SOLOSTAR 100 UNIT/ML Solostar Pen     . levothyroxine (SYNTHROID, LEVOTHROID) 100 MCG tablet Take 1 tablet (100 mcg total) by mouth daily before breakfast. 90 tablet 1  . losartan (COZAAR) 100 MG tablet TAKE 1 TABLET ONCE A DAY FOR HIGH BLOOD PRESSURE 90 tablet 3  . Magnesium 400 MG CAPS Take by mouth 4 (four) times daily.    . metFORMIN (GLUCOPHAGE) 1000 MG tablet TAKE (1) TABLET TWICE A DAY WITH MEALS (BREAKFAST AND SUPPER) 180 tablet 2  . Multiple Vitamins-Minerals (ICAPS PO) Take by mouth  daily.    . Omega-3 Fatty Acids (FISH OIL) 1000 MG CAPS Take by mouth daily.    Marland Kitchen OVER THE COUNTER MEDICATION as needed. Macular Degeneration refresh eye drops    . pravastatin (PRAVACHOL) 40 MG tablet TAKE 1 TABLET DAILY FOR CHOLESTEROL 90 tablet 3  . ranitidine (ZANTAC) 300 MG capsule Take 300 mg by mouth every evening.     No current facility-administered medications on file prior to visit.   Medical History:  Past Medical History  Diagnosis Date  . Hypertension   . Hypothyroid   . IDDM (insulin dependent diabetes mellitus)   . Hyperlipidemia   . Macular degeneration   . Varicose veins   . Leg swelling   . Shortness of breath dyspnea     with exertion  . Sleep apnea     does not wear CPAP  . GERD (gastroesophageal reflux disease)   . HOH (hard of hearing)   . Wears glasses    Allergies:  Allergies  Allergen Reactions  . Detrol [Tolterodine]      Review of Systems:  Review of Systems  Constitutional: Negative for fever, chills and malaise/fatigue.  HENT: Negative for congestion, sore throat and tinnitus.   Respiratory: Positive for cough. Negative for shortness of breath and wheezing.   Cardiovascular: Positive for leg swelling. Negative for chest pain and palpitations.  Gastrointestinal: Negative for heartburn, diarrhea, constipation, blood in stool and melena.  Genitourinary: Negative.   Skin: Negative.   Neurological: Negative for dizziness, loss of consciousness and headaches.  Psychiatric/Behavioral: Negative for depression. The patient is not nervous/anxious and does not have insomnia.     Family history- Review and unchanged  Social history- Review and unchanged  Physical Exam: BP 128/56 mmHg  Pulse 82  Temp(Src) 98.2 F (36.8 C) (Temporal)  Resp 18  Ht 5\' 4"  (1.626 m)  Wt 199 lb (90.266 kg)  BMI 34.14 kg/m2 Wt Readings from Last 3 Encounters:  05/31/15 199 lb (90.266 kg)  05/16/15 201 lb 9.6 oz (91.445 kg)  04/20/15 201 lb (91.173 kg)   General  Appearance: Well nourished well developed, non-toxic appearing, in no apparent distress. Eyes: PERRLA, EOMs, conjunctiva no swelling or erythema ENT/Mouth: Ear canals clear with no erythema, swelling, or discharge.  TMs normal bilaterally, oropharynx clear, moist, with no exudate.   Neck: Supple, thyroid normal, no JVD, no cervical adenopathy.  Respiratory: Respiratory effort normal, breath sounds clear A&P, no wheeze, rhonchi or rales noted.  No retractions, no accessory muscle usage Cardio: RRR with no MRGs. No noted edema.  Abdomen: Soft, + BS.  Non tender, no guarding, rebound, hernias, masses. Musculoskeletal: Full ROM, 5/5 strength, Normal gait Skin: Warm, dry without rashes, lesions, ecchymosis.  Neuro: Awake and oriented X 3, Cranial nerves intact. No cerebellar symptoms.  Psych: normal affect, Insight and Judgment appropriate.    Starlyn Skeans, PA-C 3:50 PM [facility]

## 2015-06-01 DIAGNOSIS — C44719 Basal cell carcinoma of skin of left lower limb, including hip: Secondary | ICD-10-CM | POA: Diagnosis not present

## 2015-06-01 LAB — HEMOGLOBIN A1C
HEMOGLOBIN A1C: 7 % — AB (ref <5.7)
MEAN PLASMA GLUCOSE: 154 mg/dL — AB (ref <117)

## 2015-06-01 LAB — BASIC METABOLIC PANEL WITH GFR
BUN: 24 mg/dL (ref 7–25)
CO2: 29 mmol/L (ref 20–31)
Calcium: 9.3 mg/dL (ref 8.6–10.4)
Chloride: 100 mmol/L (ref 98–110)
Creat: 0.97 mg/dL — ABNORMAL HIGH (ref 0.60–0.88)
GFR, EST AFRICAN AMERICAN: 63 mL/min (ref >=60)
GFR, EST NON AFRICAN AMERICAN: 55 mL/min — AB (ref >=60)
Glucose, Bld: 136 mg/dL — ABNORMAL HIGH (ref 65–99)
POTASSIUM: 4.1 mmol/L (ref 3.5–5.3)
SODIUM: 141 mmol/L (ref 135–146)

## 2015-06-01 LAB — LIPID PANEL
Cholesterol: 174 mg/dL (ref 125–200)
HDL: 44 mg/dL — AB (ref >=46)
LDL Cholesterol: 74 mg/dL (ref <130)
Total CHOL/HDL Ratio: 4 Ratio (ref <=5.0)
Triglycerides: 282 mg/dL — ABNORMAL HIGH (ref <150)
VLDL: 56 mg/dL — ABNORMAL HIGH (ref <30)

## 2015-06-01 LAB — HEPATIC FUNCTION PANEL
ALBUMIN: 3.9 g/dL (ref 3.6–5.1)
ALT: 13 U/L (ref 6–29)
AST: 15 U/L (ref 10–35)
Alkaline Phosphatase: 37 U/L (ref 33–130)
BILIRUBIN INDIRECT: 0.1 mg/dL — AB (ref 0.2–1.2)
Bilirubin, Direct: 0.1 mg/dL (ref <=0.2)
Total Bilirubin: 0.2 mg/dL (ref 0.2–1.2)
Total Protein: 6.2 g/dL (ref 6.1–8.1)

## 2015-06-01 LAB — MAGNESIUM: Magnesium: 1.7 mg/dL (ref 1.5–2.5)

## 2015-06-01 LAB — VITAMIN D 25 HYDROXY (VIT D DEFICIENCY, FRACTURES): VIT D 25 HYDROXY: 49 ng/mL (ref 30–100)

## 2015-06-01 LAB — TSH: TSH: 5.788 u[IU]/mL — AB (ref 0.350–4.500)

## 2015-06-01 LAB — INSULIN, RANDOM: INSULIN: 100.4 u[IU]/mL — AB (ref 2.0–19.6)

## 2015-06-07 DIAGNOSIS — G8929 Other chronic pain: Secondary | ICD-10-CM | POA: Diagnosis not present

## 2015-06-07 DIAGNOSIS — M545 Low back pain: Secondary | ICD-10-CM | POA: Diagnosis not present

## 2015-06-08 DIAGNOSIS — H35351 Cystoid macular degeneration, right eye: Secondary | ICD-10-CM | POA: Diagnosis not present

## 2015-06-17 ENCOUNTER — Encounter: Payer: Self-pay | Admitting: *Deleted

## 2015-06-19 ENCOUNTER — Ambulatory Visit: Payer: Medicare Other

## 2015-06-21 ENCOUNTER — Other Ambulatory Visit: Payer: Self-pay | Admitting: Internal Medicine

## 2015-07-13 DIAGNOSIS — H353221 Exudative age-related macular degeneration, left eye, with active choroidal neovascularization: Secondary | ICD-10-CM | POA: Diagnosis not present

## 2015-07-20 DIAGNOSIS — E1142 Type 2 diabetes mellitus with diabetic polyneuropathy: Secondary | ICD-10-CM | POA: Diagnosis not present

## 2015-07-20 DIAGNOSIS — L84 Corns and callosities: Secondary | ICD-10-CM | POA: Diagnosis not present

## 2015-07-20 DIAGNOSIS — B351 Tinea unguium: Secondary | ICD-10-CM | POA: Diagnosis not present

## 2015-07-26 ENCOUNTER — Other Ambulatory Visit: Payer: Self-pay | Admitting: Internal Medicine

## 2015-07-29 DIAGNOSIS — E119 Type 2 diabetes mellitus without complications: Secondary | ICD-10-CM | POA: Diagnosis not present

## 2015-07-29 DIAGNOSIS — E162 Hypoglycemia, unspecified: Secondary | ICD-10-CM | POA: Diagnosis not present

## 2015-08-02 DIAGNOSIS — M5416 Radiculopathy, lumbar region: Secondary | ICD-10-CM | POA: Diagnosis not present

## 2015-08-21 DIAGNOSIS — H353221 Exudative age-related macular degeneration, left eye, with active choroidal neovascularization: Secondary | ICD-10-CM | POA: Diagnosis not present

## 2015-08-23 ENCOUNTER — Other Ambulatory Visit: Payer: Self-pay | Admitting: Internal Medicine

## 2015-08-24 DIAGNOSIS — Z79891 Long term (current) use of opiate analgesic: Secondary | ICD-10-CM | POA: Diagnosis not present

## 2015-08-24 DIAGNOSIS — M5136 Other intervertebral disc degeneration, lumbar region: Secondary | ICD-10-CM | POA: Diagnosis not present

## 2015-08-24 DIAGNOSIS — M5416 Radiculopathy, lumbar region: Secondary | ICD-10-CM | POA: Diagnosis not present

## 2015-09-06 ENCOUNTER — Other Ambulatory Visit: Payer: Self-pay | Admitting: Internal Medicine

## 2015-09-07 ENCOUNTER — Ambulatory Visit (INDEPENDENT_AMBULATORY_CARE_PROVIDER_SITE_OTHER): Payer: Medicare Other | Admitting: Internal Medicine

## 2015-09-07 ENCOUNTER — Encounter: Payer: Self-pay | Admitting: Internal Medicine

## 2015-09-07 VITALS — BP 132/72 | HR 64 | Temp 97.5°F | Resp 16 | Ht 64.0 in | Wt 201.4 lb

## 2015-09-07 DIAGNOSIS — N183 Chronic kidney disease, stage 3 unspecified: Secondary | ICD-10-CM

## 2015-09-07 DIAGNOSIS — E669 Obesity, unspecified: Secondary | ICD-10-CM

## 2015-09-07 DIAGNOSIS — Z79899 Other long term (current) drug therapy: Secondary | ICD-10-CM

## 2015-09-07 DIAGNOSIS — Z0000 Encounter for general adult medical examination without abnormal findings: Secondary | ICD-10-CM

## 2015-09-07 DIAGNOSIS — E559 Vitamin D deficiency, unspecified: Secondary | ICD-10-CM | POA: Diagnosis not present

## 2015-09-07 DIAGNOSIS — E1022 Type 1 diabetes mellitus with diabetic chronic kidney disease: Secondary | ICD-10-CM | POA: Diagnosis not present

## 2015-09-07 DIAGNOSIS — E785 Hyperlipidemia, unspecified: Secondary | ICD-10-CM

## 2015-09-07 DIAGNOSIS — I10 Essential (primary) hypertension: Secondary | ICD-10-CM | POA: Diagnosis not present

## 2015-09-07 DIAGNOSIS — Z0001 Encounter for general adult medical examination with abnormal findings: Secondary | ICD-10-CM

## 2015-09-07 DIAGNOSIS — E039 Hypothyroidism, unspecified: Secondary | ICD-10-CM

## 2015-09-07 LAB — CBC WITH DIFFERENTIAL/PLATELET
Basophils Absolute: 0 10*3/uL (ref 0.0–0.1)
Basophils Relative: 0 % (ref 0–1)
EOS ABS: 0.4 10*3/uL (ref 0.0–0.7)
EOS PCT: 4 % (ref 0–5)
HCT: 41 % (ref 36.0–46.0)
Hemoglobin: 13.7 g/dL (ref 12.0–15.0)
LYMPHS ABS: 2.2 10*3/uL (ref 0.7–4.0)
Lymphocytes Relative: 20 % (ref 12–46)
MCH: 27.5 pg (ref 26.0–34.0)
MCHC: 33.4 g/dL (ref 30.0–36.0)
MCV: 82.3 fL (ref 78.0–100.0)
MONO ABS: 0.9 10*3/uL (ref 0.1–1.0)
MONOS PCT: 8 % (ref 3–12)
MPV: 10.4 fL (ref 8.6–12.4)
Neutro Abs: 7.5 10*3/uL (ref 1.7–7.7)
Neutrophils Relative %: 68 % (ref 43–77)
PLATELETS: 346 10*3/uL (ref 150–400)
RBC: 4.98 MIL/uL (ref 3.87–5.11)
RDW: 15 % (ref 11.5–15.5)
WBC: 11.1 10*3/uL — ABNORMAL HIGH (ref 4.0–10.5)

## 2015-09-07 LAB — BASIC METABOLIC PANEL WITH GFR
BUN: 21 mg/dL (ref 7–25)
CHLORIDE: 95 mmol/L — AB (ref 98–110)
CO2: 29 mmol/L (ref 20–31)
CREATININE: 0.96 mg/dL — AB (ref 0.60–0.88)
Calcium: 9.3 mg/dL (ref 8.6–10.4)
GFR, EST AFRICAN AMERICAN: 64 mL/min (ref >=60)
GFR, Est Non African American: 55 mL/min — ABNORMAL LOW (ref >=60)
Glucose, Bld: 114 mg/dL — ABNORMAL HIGH (ref 65–99)
Potassium: 4.1 mmol/L (ref 3.5–5.3)
Sodium: 138 mmol/L (ref 135–146)

## 2015-09-07 LAB — HEPATIC FUNCTION PANEL
ALT: 16 U/L (ref 6–29)
AST: 20 U/L (ref 10–35)
Albumin: 4.3 g/dL (ref 3.6–5.1)
Alkaline Phosphatase: 39 U/L (ref 33–130)
BILIRUBIN TOTAL: 0.3 mg/dL (ref 0.2–1.2)
Bilirubin, Direct: 0.1 mg/dL (ref <=0.2)
Indirect Bilirubin: 0.2 mg/dL (ref 0.2–1.2)
TOTAL PROTEIN: 6.9 g/dL (ref 6.1–8.1)

## 2015-09-07 LAB — TSH: TSH: 1.627 u[IU]/mL (ref 0.350–4.500)

## 2015-09-07 LAB — MAGNESIUM: Magnesium: 1.8 mg/dL (ref 1.5–2.5)

## 2015-09-07 LAB — LIPID PANEL
Cholesterol: 166 mg/dL (ref 125–200)
HDL: 45 mg/dL — ABNORMAL LOW (ref >=46)
LDL CALC: 67 mg/dL (ref <130)
Total CHOL/HDL Ratio: 3.7 Ratio (ref <=5.0)
Triglycerides: 271 mg/dL — ABNORMAL HIGH (ref <150)
VLDL: 54 mg/dL — AB (ref <30)

## 2015-09-07 LAB — HEMOGLOBIN A1C
HEMOGLOBIN A1C: 7.1 % — AB (ref <5.7)
MEAN PLASMA GLUCOSE: 157 mg/dL — AB (ref <117)

## 2015-09-07 MED ORDER — MEGARED OMEGA-3 KRILL OIL 500 MG PO CAPS: 500.0000 mg | ORAL_CAPSULE | Freq: Two times a day (BID) | ORAL | Status: DC

## 2015-09-07 MED ORDER — INSULIN LISPRO PROT & LISPRO (75-25 MIX) 100 UNIT/ML ~~LOC~~ SUSP: SUBCUTANEOUS | Status: DC

## 2015-09-07 NOTE — Patient Instructions (Signed)

## 2015-09-07 NOTE — Progress Notes (Signed)
Patient ID: Catherine Wood, female   DOB: 1931/01/27, 81 y.o.   MRN: [id number]   This very nice 81 y.o.WWF presents for follow up with Hypertension, Hyperlipidemia, Insulin Requiring T2_DM  and Vitamin D Deficiency.    Patient is treated for HTN since 1994 & BP has been controlled at home. Today's BP: 132/72 mmHg. Patient has had no complaints of any cardiac type chest pain, palpitations, dyspnea/orthopnea/PND, dizziness, claudication or dependent edema.   Hyperlipidemia is controlled with diet & meds. Patient denies myalgias or other med SE's. Last Lipids were at goal with  Cholesterol 174; HDL 44*; LDL 74; but elevated Triglycerides 282 on 05/31/2015.   Also, the patient has history of Morbid Obesity (BMI 34+) and consequent insulin req T2_DM since 2003 and has had no symptoms of reactive hypoglycemia, diabetic polys, paresthesias or visual blurring. She also has Stage 3 CKD consequent of her DM & HTN. Her usual dosing is Lantus 36 u BID and Humalog 75/25 30 u BID.  Last A1c was not at goal with A1c 7.0% on  05/31/2015.   Further, the patient also has history of Vitamin D Deficiency of 26 in 2013 and supplements vitamin D without any suspected side-effects. Last vitamin D was 49 on 05/31/2015.  Medication Sig  . TYLENOL #3 300-30 MG Take 1 tablet by mouth every 6 (six) hours as needed for moderate pain.  Marland Kitchen aspirin 81 MG tablet Take 81 mg by mouth daily.  Marland Kitchen CALCIUM 600  Take 600 mg by mouth daily.  Marland Kitchen VITAMIN D 5000 UNITS Take 5,000 Units by mouth daily.   . fenofibrate 160 MG tablet TAKE 1 TABLET DAILY  . furosemide  40 MG tablet Take 1 to 2 tablets daily depending on swelling/edema.  . gabapentin  300 MG capsule TAKE 1 CAPSULE EACH MORNING, 2 EACH AFTERNOON, AND 2 AT BEDTIME  . INVOKANA 300 MG TABS tablet TAKE (1) TABLET DAILY BE- FORE BREAKFAST.  Marland Kitchen levothyroxine 100 MCG tablet Take 1 tablet (100 mcg total) by mouth daily before breakfast.  . losartan  100 MG tablet TAKE 1 TABLET ONCE A DAY FOR HIGH  BLOOD PRESSURE  . Magnesium 400 MG CAPS Take by mouth 4 (four) times daily.  . metFORMIN  1000 MG tablet TAKE (1) TABLET TWICE A DAY WITH MEALS (BREAKFAST AND SUPPER)  . ICAPS Take by mouth daily.  Marland Kitchen OVER THE COUNTER MEDICATION as needed. Macular Degeneration refresh eye drops  . pravastatin  40 MG tablet TAKE 1 TABLET DAILY FOR CHOLESTEROL  . ranitidine  300 MG capsule Take 300 mg by mouth every evening.  Marland Kitchen HUMALOG MIX 75/25 KWIKPEN  35 UNITS EVERY MORNING AND 15 TO 20 UNITS EVERY EVENING AS DIRECTED  . LANTUS SOLOSTAR  36 units BID  . Omega-3 FISH OIL 1000 MG CAPS Take by mouth daily.   Allergies  Allergen Reactions  . Detrol [Tolterodine]    PMHx:   Past Medical History  Diagnosis Date  . Hypertension   . Hypothyroid   . IDDM (insulin dependent diabetes mellitus) ([place])   . Hyperlipidemia   . Macular degeneration   . Varicose veins   . Leg swelling   . Shortness of breath dyspnea     with exertion  . Sleep apnea     does not wear CPAP  . GERD (gastroesophageal reflux disease)   . HOH (hard of hearing)   . Wears glasses    Immunization History  Administered Date(s) Administered  . DT 10/20/2014  .  Influenza-Unspecified 06/22/2013, 06/16/2014, 06/27/2014, 07/06/2015  . Pneumococcal Conjugate-13 04/05/2014  . Pneumococcal-Unspecified 09/09/2004  . Td 09/10/2003  . Zoster 09/09/2005   Past Surgical History  Procedure Laterality Date  . Biopsy thyroid      benign thyroid nodule removal  . Varicose vein surgery    . Cataract extraction, bilateral    . Endometrial biopsy  2000  . Colonoscopy w/ biopsies and polypectomy    . Tubal ligation    . Anterior cervical decomp/discectomy fusion N/A 11/08/2014    Procedure: Cervical three-four,  Cervical six-seven anterior cervical decompression with fusion plating and bonegraft;  Surgeon: Ashok Pall, MD;  Location: [facility];  Service: Neurosurgery;  Laterality: N/A;  Cervical three-four,  Cervical six-seven anterior cervical  decompression with fusion plating and bonegraft  . Spine surgery Left  11/08/2014    Left ACDF    FHx:    Reviewed / unchanged  SHx:    Reviewed / unchanged  Systems Review:  Constitutional: Denies fever, chills, wt changes, headaches, insomnia, fatigue, night sweats, change in appetite. Eyes: Denies redness, blurred vision, diplopia, discharge, itchy, watery eyes.  ENT: Denies discharge, congestion, post nasal drip, epistaxis, sore throat, earache, hearing loss, dental pain, tinnitus, vertigo, sinus pain, snoring.  CV: Denies chest pain, palpitations, irregular heartbeat, syncope, dyspnea, diaphoresis, orthopnea, PND, claudication or edema. Respiratory: denies cough, dyspnea, DOE, pleurisy, hoarseness, laryngitis, wheezing.  Gastrointestinal: Denies dysphagia, odynophagia, heartburn, reflux, water brash, abdominal pain or cramps, nausea, vomiting, bloating, diarrhea, constipation, hematemesis, melena, hematochezia  or hemorrhoids. Genitourinary: Denies dysuria, frequency, urgency, nocturia, hesitancy, discharge, hematuria or flank pain. Musculoskeletal: Denies arthralgias, myalgias, stiffness, jt. swelling, pain, limping or strain/sprain.  Skin: Denies pruritus, rash, hives, warts, acne, eczema or change in skin lesion(s). Neuro: No weakness, tremor, incoordination, spasms, paresthesia or pain. Psychiatric: Denies confusion, memory loss or sensory loss. Endo: Denies change in weight, skin or hair change.  Heme/Lymph: No excessive bleeding, bruising or enlarged lymph nodes.  Physical Exam  BP 132/72 mmHg  Pulse 64  Temp(Src) 97.5 F (36.4 C)  Resp 16  Ht 5\' 4"  (1.626 m)  Wt 201 lb 6.4 oz (91.354 kg)  BMI 34.55 kg/m2  Appears well nourished and in no distress. Eyes: PERRLA, EOMs, conjunctiva no swelling or erythema. Sinuses: No frontal/maxillary tenderness ENT/Mouth: EAC's clear, TM's nl w/o erythema, bulging. Nares clear w/o erythema, swelling, exudates. Oropharynx clear without  erythema or exudates. Oral hygiene is good. Tongue normal, non obstructing. Hearing intact.  Neck: Supple. Thyroid nl. Car 2+/2+ without bruits, nodes or JVD. Chest: Respirations nl with BS clear & equal w/o rales, rhonchi, wheezing or stridor.  Cor: Heart sounds normal w/ regular rate and rhythm without sig. murmurs, gallops, clicks, or rubs. Peripheral pulses normal and equal  without edema.  Abdomen: Soft & bowel sounds normal. Non-tender w/o guarding, rebound, hernias, masses, or organomegaly.  Lymphatics: Unremarkable.  Musculoskeletal: Full ROM all peripheral extremities, joint stability, 5/5 strength, and normal gait.  Skin: Warm, dry without exposed rashes, lesions or ecchymosis apparent.  Neuro: Cranial nerves intact, reflexes equal bilaterally. Sensory-motor testing grossly intact. Tendon reflexes grossly intact.  Pysch: Alert & oriented x 3.  Insight and judgement nl & appropriate. No ideations.  Assessment and Plan:  1. Encounter for general adult medical examination with abnormal findings   2. Essential hypertension  - TSH  3. Hyperlipidemia  - Lipid panel - TSH - MEGARED OMEGA-3 KRILL OIL 500 MG CAPS; Take 500 mg by mouth 2 (two) times daily.  4. Type 1 diabetes mellitus with stage 3 chronic kidney disease (HCC)  - Hemoglobin A1c - insulin lispro protamine-lispro (HUMALOG MIX 75/25) (75-25) 100 UNIT/ML SUSP injection; Use 35-50 units bid as directed  Dispense: 15 mL; Refill: 11  5. Vitamin D deficiency  - VITAMIN D 25 Hydroxy   6. Hypothyroidism, unspecified hypothyroidism type   7. Obesity   8. CKD (chronic kidney disease) stage 3, GFR 30-59 ml/min   9. Medication management  - CBC with Differential/Platelet - BASIC METABOLIC PANEL WITH GFR - Hepatic function panel - Magnesium   Recommended regular exercise, BP monitoring, weight control, and discussed med and SE's. Recommended labs to assess and monitor clinical status. Further disposition pending  results of labs. Over 30 minutes of exam, counseling, chart review was performed

## 2015-09-08 ENCOUNTER — Other Ambulatory Visit: Payer: Self-pay | Admitting: Internal Medicine

## 2015-09-08 LAB — VITAMIN D 25 HYDROXY (VIT D DEFICIENCY, FRACTURES): VIT D 25 HYDROXY: 43 ng/mL (ref 30–100)

## 2015-09-14 DIAGNOSIS — H35032 Hypertensive retinopathy, left eye: Secondary | ICD-10-CM | POA: Diagnosis not present

## 2015-09-14 DIAGNOSIS — H353222 Exudative age-related macular degeneration, left eye, with inactive choroidal neovascularization: Secondary | ICD-10-CM | POA: Diagnosis not present

## 2015-09-14 DIAGNOSIS — H35031 Hypertensive retinopathy, right eye: Secondary | ICD-10-CM | POA: Diagnosis not present

## 2015-09-14 DIAGNOSIS — H35341 Macular cyst, hole, or pseudohole, right eye: Secondary | ICD-10-CM | POA: Diagnosis not present

## 2015-09-14 DIAGNOSIS — E119 Type 2 diabetes mellitus without complications: Secondary | ICD-10-CM | POA: Diagnosis not present

## 2015-09-20 DIAGNOSIS — M5416 Radiculopathy, lumbar region: Secondary | ICD-10-CM | POA: Diagnosis not present

## 2015-09-28 DIAGNOSIS — B351 Tinea unguium: Secondary | ICD-10-CM | POA: Diagnosis not present

## 2015-09-28 DIAGNOSIS — E1142 Type 2 diabetes mellitus with diabetic polyneuropathy: Secondary | ICD-10-CM | POA: Diagnosis not present

## 2015-09-28 DIAGNOSIS — L84 Corns and callosities: Secondary | ICD-10-CM | POA: Diagnosis not present

## 2015-10-03 DIAGNOSIS — H353211 Exudative age-related macular degeneration, right eye, with active choroidal neovascularization: Secondary | ICD-10-CM | POA: Diagnosis not present

## 2015-10-03 DIAGNOSIS — H35341 Macular cyst, hole, or pseudohole, right eye: Secondary | ICD-10-CM | POA: Diagnosis not present

## 2015-10-03 DIAGNOSIS — H353134 Nonexudative age-related macular degeneration, bilateral, advanced atrophic with subfoveal involvement: Secondary | ICD-10-CM | POA: Diagnosis not present

## 2015-10-03 DIAGNOSIS — H353221 Exudative age-related macular degeneration, left eye, with active choroidal neovascularization: Secondary | ICD-10-CM | POA: Diagnosis not present

## 2015-10-05 DIAGNOSIS — M5136 Other intervertebral disc degeneration, lumbar region: Secondary | ICD-10-CM | POA: Diagnosis not present

## 2015-10-05 DIAGNOSIS — Z79891 Long term (current) use of opiate analgesic: Secondary | ICD-10-CM | POA: Diagnosis not present

## 2015-10-05 DIAGNOSIS — M5416 Radiculopathy, lumbar region: Secondary | ICD-10-CM | POA: Diagnosis not present

## 2015-10-23 ENCOUNTER — Ambulatory Visit (INDEPENDENT_AMBULATORY_CARE_PROVIDER_SITE_OTHER): Payer: Medicare Other | Admitting: Internal Medicine

## 2015-10-23 ENCOUNTER — Encounter: Payer: Self-pay | Admitting: Internal Medicine

## 2015-10-23 VITALS — BP 144/62 | HR 78 | Temp 97.8°F | Resp 16 | Ht 64.0 in | Wt 209.0 lb

## 2015-10-23 DIAGNOSIS — Z794 Long term (current) use of insulin: Secondary | ICD-10-CM | POA: Diagnosis not present

## 2015-10-23 DIAGNOSIS — N183 Chronic kidney disease, stage 3 (moderate): Secondary | ICD-10-CM

## 2015-10-23 DIAGNOSIS — E1122 Type 2 diabetes mellitus with diabetic chronic kidney disease: Secondary | ICD-10-CM

## 2015-10-23 DIAGNOSIS — E162 Hypoglycemia, unspecified: Secondary | ICD-10-CM

## 2015-10-23 NOTE — Progress Notes (Signed)
   Subjective:    Patient ID: Catherine Wood, female    DOB: October 27, 1930, 82 y.o.   MRN: [id number]  HPI  Patient presents to the office for evaluation of hypoglycemia.  She reports that she has been having some issues with the blood sugars.  She reports that this started at the same time that she started the nutrisystem diet.  She reports that blood sugars are dropping as low as 40.  She is doing 36 units of lantus 30 in the monring and 30 n the evenings.  She is also doing humalog 30 in the evenings 30 units.  She reports that she has been taking her fluid pills daily but she is having a lot more swelling in her legs.Review of Systems  Constitutional: Negative for fever, chills and fatigue.  HENT: Negative for congestion, ear pain, mouth sores, postnasal drip, sinus pressure, sore throat, trouble swallowing and voice change.   Eyes: Negative.   Respiratory: Negative for chest tightness and shortness of breath.   Cardiovascular: Negative for chest pain, palpitations and leg swelling.  Gastrointestinal: Positive for nausea. Negative for vomiting, abdominal pain and constipation.  Neurological: Positive for dizziness, tremors and light-headedness. Negative for weakness.  Psychiatric/Behavioral: Negative for suicidal ideas, confusion, dysphoric mood and agitation. The patient is not nervous/anxious.        Objective:   Physical Exam  Constitutional: She is oriented to person, place, and time. She appears well-developed and well-nourished. No distress.  HENT:  Head: Normocephalic.  Mouth/Throat: Oropharynx is clear and moist. No oropharyngeal exudate.  Eyes: Conjunctivae are normal. No scleral icterus.  Neck: Normal range of motion. Neck supple. No JVD present. No thyromegaly present.  Cardiovascular: Normal rate, regular rhythm and intact distal pulses.  Exam reveals no gallop and no friction rub.   No murmur heard. Pulmonary/Chest: Effort normal and breath sounds normal. No respiratory distress.  She has no wheezes. She has no rales. She exhibits no tenderness.  Abdominal: Soft. Bowel sounds are normal. She exhibits no distension and no mass. There is no tenderness. There is no rebound and no guarding.  Musculoskeletal: Normal range of motion.  Lymphadenopathy:    She has no cervical adenopathy.  Neurological: She is alert and oriented to person, place, and time.  Skin: Skin is warm and dry. She is not diaphoretic.  Psychiatric: She has a normal mood and affect. Her behavior is normal. Judgment and thought content normal.  Nursing note and vitals reviewed.   Filed Vitals:   10/23/15 1039  BP: 144/62  Pulse: 78  Temp: 97.8 F (36.6 C)  Resp: 16          Assessment & Plan:    1. Hypoglycemia -stop invokana -stop nutrisystem due to the increased sodium in the diet -increase lasix to 2 tablets for 3 days -keep BS log -call office if further readings less than 70 -may need change in insulin if continued low readings -referral to nutritionist

## 2015-10-23 NOTE — Patient Instructions (Signed)
Please stop using invokana.  This is the half tablet that you take to help with your sugars.  Continue to use your regular metformin and your insulin.    Please stop the nutrisystem.  Please try to limit sodium and and also stop the sugar in your die until we can get you to a nutritionist.  [name] will call you about the appointment.    Please take 2 tablets of your lasix daily for 3 days to see if this will help with the swelling in your legs.  Please weigh your daily so that we can see your weight going down.

## 2015-10-25 ENCOUNTER — Other Ambulatory Visit: Payer: Self-pay | Admitting: Internal Medicine

## 2015-10-26 ENCOUNTER — Ambulatory Visit (INDEPENDENT_AMBULATORY_CARE_PROVIDER_SITE_OTHER): Payer: Medicare Other | Admitting: Internal Medicine

## 2015-10-26 ENCOUNTER — Encounter: Payer: Self-pay | Admitting: Internal Medicine

## 2015-10-26 VITALS — BP 154/60 | HR 76 | Temp 98.2°F | Resp 18 | Ht 64.0 in | Wt 202.0 lb

## 2015-10-26 DIAGNOSIS — E162 Hypoglycemia, unspecified: Secondary | ICD-10-CM

## 2015-10-26 NOTE — Patient Instructions (Addendum)
Please start eating 6 small meals per day.  Please choose smart choices like fruits and vegetables for you snacks in between your meals.    Please continue to stay off the invokana.    Please cut your morning dose of humalog to 25 units in the morning after your breakfast and cut your evening dose of your humalog to 20 units.  Please hold your humalog if your blood sugar is less than 140 in the evenings.    Please go back to your 1 tablet of lasix daily.  Please make sure you continue to avoid salt.    Please keep a daily log of your blood sugars and we will plan on seeing you back in 1 week.

## 2015-10-26 NOTE — Progress Notes (Signed)
   Subjective:    Patient ID: Catherine Wood, female    DOB: 11/27/1930, 82 y.o.   MRN: [id number]  HPI  Patient presents to the office for evaluation of blood sugars.  Patient was seen 3 days ago and was taken off of invokana and started on increased lasix x 3 days.  She reports that she still feels like her blood sugars are dropping very low.  She reports that she was sweaty and sleeping.  She reports that she dropped to 50 at blood sugar.  She reports that she checks it every 15 minutes after the blood sugars dropped.  She then checked her blood sugar at 12:45 and it was 159.  At 2:50 am it was 252.  In the morning of the next day it was 124.     Review of Systems  Constitutional: Positive for fatigue. Negative for fever and chills.  Respiratory: Negative for chest tightness and shortness of breath.   Gastrointestinal: Positive for nausea. Negative for vomiting, abdominal pain, diarrhea and constipation.  Neurological: Positive for dizziness and light-headedness.       Objective:   Physical Exam  Constitutional: She is oriented to person, place, and time. She appears well-developed and well-nourished. No distress.  HENT:  Head: Normocephalic.  Mouth/Throat: Oropharynx is clear and moist. No oropharyngeal exudate.  Eyes: Conjunctivae are normal. No scleral icterus.  Neck: Normal range of motion. Neck supple. No JVD present. No thyromegaly present.  Cardiovascular: Normal rate, regular rhythm, normal heart sounds and intact distal pulses.  Exam reveals no gallop and no friction rub.   No murmur heard. Pulmonary/Chest: Effort normal and breath sounds normal. No respiratory distress. She has no wheezes. She has no rales. She exhibits no tenderness.  Abdominal: Soft. Bowel sounds are normal. She exhibits no distension and no mass. There is no tenderness. There is no rebound and no guarding.  Musculoskeletal: Normal range of motion.  Lymphadenopathy:    She has no cervical adenopathy.   Neurological: She is alert and oriented to person, place, and time.  Skin: Skin is warm and dry. She is not diaphoretic.  Psychiatric: She has a normal mood and affect. Her behavior is normal. Judgment and thought content normal.  Vitals reviewed.   Wt Readings from Last 3 Encounters:  10/26/15 202 lb (91.627 kg)  10/23/15 209 lb (94.802 kg)  09/07/15 201 lb 6.4 oz (91.354 kg)          Assessment & Plan:    1. Hypoglycemia -d/c invokana -cut humalog back to 25 units in the am and 20 units in pm -encouraged more frequent meals -cont current dose of lantus -cont metformin

## 2015-10-31 ENCOUNTER — Other Ambulatory Visit: Payer: Self-pay | Admitting: *Deleted

## 2015-10-31 MED ORDER — GLUCOSE BLOOD VI STRP: ORAL_STRIP | Status: DC

## 2015-11-02 ENCOUNTER — Encounter: Payer: Self-pay | Admitting: Internal Medicine

## 2015-11-02 ENCOUNTER — Ambulatory Visit (INDEPENDENT_AMBULATORY_CARE_PROVIDER_SITE_OTHER): Payer: Medicare Other | Admitting: Internal Medicine

## 2015-11-02 VITALS — BP 126/54 | HR 64 | Temp 97.5°F | Resp 16 | Ht 64.0 in | Wt 201.8 lb

## 2015-11-02 DIAGNOSIS — E1122 Type 2 diabetes mellitus with diabetic chronic kidney disease: Secondary | ICD-10-CM

## 2015-11-02 DIAGNOSIS — Z794 Long term (current) use of insulin: Secondary | ICD-10-CM

## 2015-11-02 DIAGNOSIS — E162 Hypoglycemia, unspecified: Secondary | ICD-10-CM

## 2015-11-02 DIAGNOSIS — N183 Chronic kidney disease, stage 3 unspecified: Secondary | ICD-10-CM

## 2015-11-02 NOTE — Progress Notes (Addendum)
Subjective:    Patient ID: Catherine Wood, female    DOB: 1931-02-11, 82 y.o.   MRN: [id number]  HPI Patient is a very nice 82 yo WF presenting for Diabetic review and has been on Metformin 1000 mg bid, and recently stopped Invokana due to $$$. She's also taking Lantus 36 units bid and in add Humalog 75/25 mix - 25 & 20 units bid. Apparently she has been obsessing and over-checking her CBG's >100 x in the last 2-3 weeks. She has had a few low values in the range of 60-70, but no severe hypoglycemic episodes. In the other extreme , she has had valves in the 200-250 range.   Medication Sig  . aspirin 81 MG tablet Take 81 mg by mouth daily.  Marland Kitchen CALCIUM 600  Take 600 mg by mouth daily.  Marland Kitchen VITAMIN D 5000 UNITS  Take 5,000 Units by mouth daily.   . fenofibrate 160 MG tablet TAKE 1 TABLET DAILY  . furosemide  40 MG tablet Take 1 to 2 tablets daily depending on swelling/edema.  . gabapentin  300 MG capsule 1 CAPSULE EACH MORNING, 2 EACH AFTERNOON, AND 2 AT BEDTIME  . LANTUS SOLOSTAR  Inject 36 Units into the skin 2 (two) times daily.   Marland Kitchen HUMALOG MIX 75/25) Use 35-50 units bid as directed  . LANTUS SOLOSTAR  50 UNITS SUB-Q TWO TIMES A DAY  . levothyroxine  100 MCG tablet Take 1 tablet (100 mcg total) by mouth daily before breakfast.  . losartan (COZAAR) 100 MG tablet TAKE 1 TABLET ONCE A DAY FOR HIGH BLOOD PRESSURE  . Magnesium 400 MG CAPS Take by mouth 4 (four) times daily.  Marland Kitchen MEGARED OMEGA-3 KRILL OIL 500 MG  Take 500 mg by mouth 2 (two) times daily.  . metFORMIN  1000 MG tablet TAKE (1) TABLET TWICE A DAY WITH MEALS (BREAKFAST AND SUPPER)  . Multiple Vitamins-Minerals (ICAPS PO) Take by mouth daily.  Marland Kitchen OVER THE COUNTER MEDICATION as needed. Macular Degeneration refresh eye drops  . pravastatin (PRAVACHOL) 40 MG tablet TAKE 1 TABLET DAILY FOR CHOLESTEROL  . ranitidine (ZANTAC) 300 MG capsule Take 300 mg by mouth every evening.  . traMADol (ULTRAM) 50 MG tablet Take by mouth every 6 (six) hours as  needed. Reported on 10/26/2015   Allergies  Allergen Reactions  . Detrol [Tolterodine]    Past Medical History  Diagnosis Date  . Hypertension   . Hypothyroid   . IDDM (insulin dependent diabetes mellitus) ([place])   . Hyperlipidemia   . Macular degeneration   . Varicose veins   . Leg swelling   . Shortness of breath dyspnea     with exertion  . Sleep apnea     does not wear CPAP  . GERD (gastroesophageal reflux disease)   . HOH (hard of hearing)   . Wears glasses    Review of Systems  10 point systems review negative except as above.     Objective:   Physical Exam  BP 126/54 mmHg  Pulse 64  Temp(Src) 97.5 F (36.4 C)  Resp 16  Ht 5\' 4"  (1.626 m)  Wt 201 lb 12.8 oz (91.536 kg)  BMI 34.62 kg/m2   HEENT - Eac's patent. TM's Nl. EOM's full. PERRLA. NasoOroPharynx clear. Neck - supple. Nl Thyroid. Carotids 2+ & No bruits, nodes, JVD Chest - Clear equal BS w/o Rales, rhonchi, wheezes. Cor - Nl HS. RRR w/o sig MGR. PP 1(+). No edema. Abd - No palpable organomegaly, masses  or tenderness. BS nl. MS- FROM w/o deformities. Muscle power, tone and bulk Nl. Gait Nl. Neuro - No obvious Cr N abnormalities. Sensory, motor and Cerebellar functions appear Nl w/o focal abnormalities. Psyche - Mental status normal & appropriate.  No delusions, ideations or obvious mood abnormalities.    Assessment & Plan:   1. Type 2 diabetes mellitus with stage 3 chronic kidney disease, with long-term current use of insulin ([place])   2. Hypoglycemia - discussed with patient to stop lantus for now and try to use only Novolog 75/25 MIx and take 25 units qam & 15 u qpm and return in 1 week with list of BP's and insulin dosing. Suspect possible Somogyi Phenomena prompting chronic overdosing of her insulin.

## 2015-11-07 DIAGNOSIS — H353221 Exudative age-related macular degeneration, left eye, with active choroidal neovascularization: Secondary | ICD-10-CM | POA: Diagnosis not present

## 2015-11-07 DIAGNOSIS — H353134 Nonexudative age-related macular degeneration, bilateral, advanced atrophic with subfoveal involvement: Secondary | ICD-10-CM | POA: Diagnosis not present

## 2015-11-07 DIAGNOSIS — H35341 Macular cyst, hole, or pseudohole, right eye: Secondary | ICD-10-CM | POA: Diagnosis not present

## 2015-11-09 ENCOUNTER — Encounter: Payer: Self-pay | Admitting: Internal Medicine

## 2015-11-09 ENCOUNTER — Ambulatory Visit (INDEPENDENT_AMBULATORY_CARE_PROVIDER_SITE_OTHER): Payer: Medicare Other | Admitting: Internal Medicine

## 2015-11-09 VITALS — BP 130/62 | HR 72 | Temp 97.9°F | Resp 16 | Ht 64.0 in | Wt 197.6 lb

## 2015-11-09 DIAGNOSIS — E1122 Type 2 diabetes mellitus with diabetic chronic kidney disease: Secondary | ICD-10-CM

## 2015-11-09 DIAGNOSIS — Z794 Long term (current) use of insulin: Secondary | ICD-10-CM

## 2015-11-09 DIAGNOSIS — J208 Acute bronchitis due to other specified organisms: Secondary | ICD-10-CM

## 2015-11-09 DIAGNOSIS — N183 Chronic kidney disease, stage 3 (moderate): Secondary | ICD-10-CM

## 2015-11-09 MED ORDER — AZITHROMYCIN 250 MG PO TABS: ORAL_TABLET | ORAL | Status: DC

## 2015-11-09 NOTE — Progress Notes (Signed)
Subjective:    Patient ID: Catherine Wood, female    DOB: April 24, 1931, 82 y.o.   MRN: [id number]  HPI  Patient returns for 1 week f/u of CBG response to slow titration of Humalog 75/25 mix from 25-20 bid to 25-15 bid as Lantus 36-36 bid was d/c'd along with Invokana which patient was unable to afford. List of bid CBG's was reviewed with readings in the 200-250 range and no noted hypoglycemic episodes.  Patient also reports a cough productive of a yellowish green sputum. Denies fever, chills, sweats or dyspnea.  Medication Sig  . aspirin 81 MG tablet Take 81 mg by mouth daily.  . Blood Glucose Monitoring Suppl (ACCU-CHEK AVIVA) device Check blood sugar 2 times daily-DX-E11.9  . Calcium Carbonate (CALCIUM 600 PO) Take 600 mg by mouth daily.  . Cholecalciferol (VITAMIN D3) 5000 UNITS CAPS Take 5,000 Units by mouth daily.   . fenofibrate 160 MG tablet TAKE 1 TABLET DAILY  . furosemide (LASIX) 40 MG tablet Take 1 to 2 tablets daily depending on swelling/edema.  . gabapentin (NEURONTIN) 300 MG capsule 1 CAPSULE EACH MORNING, 2 EACH AFTERNOON, AND 2 AT BEDTIME  . glucose blood (ACCU-CHEK AVIVA PLUS) test strip Check blood sugar 3 times daily-DX-E11.22.  Marland Kitchen HUMALOG MIX 75/25 KWIKPEN (75-25) 100 UNIT/ML Kwikpen   . insulin lispro protamine-lispro (HUMALOG MIX 75/25) (75-25) 100 UNIT/ML SUSP injection Use 35-50 units bid as directed  . Insulin Pen Needle (PEN NEEDLES 29GX1/2") 29G X 12MM MISC USE 4 TIMES A DAY  . Lancets (ACCU-CHEK SOFT TOUCH) lancets Check blood sugar 2 times daily-DX-E11.9.  . levothyroxine (SYNTHROID, LEVOTHROID) 100 MCG tablet Take 1 tablet (100 mcg total) by mouth daily before breakfast.  . losartan (COZAAR) 100 MG tablet TAKE 1 TABLET ONCE A DAY FOR HIGH BLOOD PRESSURE  . Magnesium 400 MG CAPS Take by mouth 4 (four) times daily.  Marland Kitchen MEGARED OMEGA-3 KRILL OIL 500 MG CAPS Take 500 mg by mouth 2 (two) times daily.  . metFORMIN (GLUCOPHAGE) 1000 MG tablet TAKE (1) TABLET TWICE A DAY WITH  MEALS (BREAKFAST AND SUPPER)  . Multiple Vitamins-Minerals (ICAPS PO) Take by mouth daily.  Marland Kitchen OVER THE COUNTER MEDICATION as needed. Macular Degeneration refresh eye drops  . pravastatin (PRAVACHOL) 40 MG tablet TAKE 1 TABLET DAILY FOR CHOLESTEROL  . ranitidine (ZANTAC) 300 MG capsule Take 300 mg by mouth every evening.  . traMADol (ULTRAM) 50 MG tablet Take by mouth every 6 (six) hours as needed. Reported on 10/26/2015  . Insulin Glargine (LANTUS SOLOSTAR [place]) Inject 36 Units into the skin 2 (two) times daily.   Marland Kitchen LANTUS SOLOSTAR 100 UNIT/ML Solostar Pen 50 UNITS SUB-Q TWO TIMES A DAY   Allergies  Allergen Reactions  . Detrol [Tolterodine]    Past Medical History  Diagnosis Date  . Hypertension   . Hypothyroid   . IDDM (insulin dependent diabetes mellitus) ([place])   . Hyperlipidemia   . Macular degeneration   . Varicose veins   . Leg swelling   . Shortness of breath dyspnea     with exertion  . Sleep apnea     does not wear CPAP  . GERD (gastroesophageal reflux disease)   . HOH (hard of hearing)   . Wears glasses    Review of Systems  10 point systems review negative except as above.    Objective:   Physical Exam  BP 130/62 mmHg  Pulse 72  Temp(Src) 97.9 F (36.6 C)  Resp 16  Ht 5'  4" (1.626 m)  Wt 197 lb 9.6 oz (89.631 kg)  BMI 33.90 kg/m2  No respiratory distress. (+) congested cough.   HEENT - Eac's patent. TM's Nl. EOM's full. PERRLA. NasoOroPharynx clear. Neck - supple. Nl Thyroid. Carotids 2+ & No bruits, nodes, JVD Chest - Scattered rales w/o rhonchi or wheezes. Cor - Nl HS. RRR w/o sig MGR. PP 1(+). No edema. Abd - No palpable organomegaly, masses or tenderness. BS nl. MS- FROM w/o deformities. Muscle power, tone and bulk Nl. Gait Nl. Neuro - No obvious Cr N abnormalities. Sensory, motor and Cerebellar functions appear Nl w/o focal abnormalities. Psyche - Mental status normal & appropriate.  No delusions, ideations or obvious mood abnormalities. Skin clear  w/o rash, cyanosis, icterus.    Assessment & Plan:   1. Type 2 diabetes mellitus with stage 3 chronic kidney disease, with long-term current use of insulin ([facility])  - ( Note patient is off of Lantus 72 units and doing well ! )   - Recc purchase from [facility] & increase Novolin 70/30 to 28-18 bid & continue bid CBG monitoring   2. Acute bronchitis due to other specified organisms  - azithromycin (ZITHROMAX) 250 MG tablet; Take 2 tablets (500 mg) on  Day 1,  followed by 1 tablet (250 mg) once daily on Days 2 through 5.  Dispense: 6 each; Refill: 1

## 2015-11-10 ENCOUNTER — Other Ambulatory Visit: Payer: Self-pay | Admitting: Internal Medicine

## 2015-11-20 ENCOUNTER — Ambulatory Visit: Payer: Self-pay | Admitting: Internal Medicine

## 2015-12-07 DIAGNOSIS — E1142 Type 2 diabetes mellitus with diabetic polyneuropathy: Secondary | ICD-10-CM | POA: Diagnosis not present

## 2015-12-07 DIAGNOSIS — L84 Corns and callosities: Secondary | ICD-10-CM | POA: Diagnosis not present

## 2015-12-07 DIAGNOSIS — B351 Tinea unguium: Secondary | ICD-10-CM | POA: Diagnosis not present

## 2015-12-08 ENCOUNTER — Encounter: Payer: Self-pay | Admitting: *Deleted

## 2015-12-08 ENCOUNTER — Encounter: Payer: Medicare Other | Attending: Internal Medicine | Admitting: *Deleted

## 2015-12-08 VITALS — Ht 63.0 in | Wt 198.9 lb

## 2015-12-08 DIAGNOSIS — N183 Chronic kidney disease, stage 3 (moderate): Secondary | ICD-10-CM | POA: Diagnosis not present

## 2015-12-08 DIAGNOSIS — E162 Hypoglycemia, unspecified: Secondary | ICD-10-CM | POA: Insufficient documentation

## 2015-12-08 DIAGNOSIS — E1122 Type 2 diabetes mellitus with diabetic chronic kidney disease: Secondary | ICD-10-CM | POA: Diagnosis not present

## 2015-12-08 DIAGNOSIS — Z794 Long term (current) use of insulin: Secondary | ICD-10-CM | POA: Insufficient documentation

## 2015-12-08 DIAGNOSIS — E119 Type 2 diabetes mellitus without complications: Secondary | ICD-10-CM

## 2015-12-08 NOTE — Patient Instructions (Signed)
Plan:  Aim for 2 Carb Choices per meal (30 grams) +/- 1 either way  Aim for 0-1 Carbs per snack if hungry  Include protein in moderation with your meals and snacks Continue checking BG two times per day as directed by MD  Consider taking medication Humalog 75/28 insulin before breakfast and before supper

## 2015-12-08 NOTE — Progress Notes (Signed)
Diabetes Self-Management Education  Visit Type: First/Initial  Appt. Start Time: 1030 Appt. End Time: 1200  12/08/2015  [name], identified by name and date of birth, is a 82 y.o. female with a diagnosis of Diabetes: Type 2. Patient has had DM 2 for over 20 years. She is here with her daughter, Catherine Wood, who appears very supportive. She previously was on Lantus as well as Humalog 75/25 and had issues with low BG, so she is not taking the Lantus anymore. She states her BG runs 170-225 with some excursions outside this range.   ASSESSMENT  Height 5\' 3"  (1.6 m), weight 198 lb 14.4 oz (90.22 kg). Body mass index is 35.24 kg/(m^2).      Diabetes Self-Management Education - 12/08/15 1046    Visit Information   Visit Type First/Initial   Initial Visit   Diabetes Type Type 2   Are you currently following a meal plan? Yes   What type of meal plan do you follow? No Salt   Are you taking your medications as prescribed? Yes   Date Diagnosed 23 years ago   Health Coping   How would you rate your overall health? Good   Psychosocial Assessment   Self-care barriers Hard of hearing;Impaired vision   Self-management support Family   Other persons present Patient;Family Member   Patient Concerns Nutrition/Meal planning   Learning Readiness Ready   What is the last grade level you completed in school? 8th grade   Complications   Last HgB A1C per patient/outside source 7.1 %   How often do you check your blood sugar? 1-2 times/day   Number of hypoglycemic episodes per month --  had history of low BG unitl Lantus was stopped   Have you had a dilated eye exam in the past 12 months? Yes   Have you had a dental exam in the past 12 months? Yes   Are you checking your feet? Yes   How many days per week are you checking your feet? 7   Dietary Intake   Breakfast boiled egg, occasionally with bread and [place] slice   Snack (morning) fresh fruit OR 1/2 sandwich OR PNB on crackers   Lunch left  overs   Dinner vegetables, occasionally meat, occasionally starch   Snack (evening) fresh fruit with slice of [place]   Beverage(s) coffee   Exercise   Exercise Type Light (walking / raking leaves)   How many days per week to you exercise? 7   How many minutes per day do you exercise? 10   Total minutes per week of exercise 70   Patient Education   Previous Diabetes Education No   Nutrition management  Role of diet in the treatment of diabetes and the relationship between the three main macronutrients and blood glucose level;Food label reading, portion sizes and measuring food.;Carbohydrate counting;Meal timing in regards to the patients' current diabetes medication.;Meal options for control of blood glucose level and chronic complications.  Discussed renal failure in relation to using carb choices to prevent low BG   Medications Reviewed patients medication for diabetes, action, purpose, timing of dose and side effects.  Insulin action of Humalog 75/25 and to take pre- meal consistently   Monitoring Taught/discussed recording of test results and interpretation of SMBG.;Identified appropriate SMBG and/or A1C goals.   Acute complications Discussed and identified patients' treatment of hyperglycemia.   Chronic complications Identified and discussed with patient  current chronic complications   Psychosocial adjustment Helped patient identify a support system  for diabetes management   Individualized Goals (developed by patient)   Nutrition Follow meal plan discussed   Physical Activity 15 minutes per day   Medications take my medication as prescribed   Monitoring  test my blood glucose as discussed   Outcomes   Expected Outcomes Demonstrated interest in learning. Expect positive outcomes   Future DMSE PRN   Program Status Completed      Individualized Plan for Diabetes Self-Management Training:   Learning Objective:  Patient will have a greater understanding of diabetes  self-management. Patient education plan is to attend individual and/or group sessions per assessed needs and concerns.   Plan:   Patient Instructions  Plan:  Aim for 2 Carb Choices per meal (30 grams) +/- 1 either way  Aim for 0-1 Carbs per snack if hungry  Include protein in moderation with your meals and snacks Continue checking BG two times per day as directed by MD  Consider taking medication Humalog 75/28 insulin before breakfast and before supper       Expected Outcomes:  Demonstrated interest in learning. Expect positive outcomes  Education material provided: A1C conversion sheet, Meal plan card, Snack sheet and Carbohydrate counting sheet  If problems or questions, patient to contact team via:  Phone and Email  Future DSME appointment: PRN

## 2015-12-12 ENCOUNTER — Ambulatory Visit: Payer: Self-pay | Admitting: Physician Assistant

## 2015-12-15 ENCOUNTER — Other Ambulatory Visit: Payer: Self-pay

## 2015-12-15 ENCOUNTER — Encounter: Payer: Self-pay | Admitting: Physician Assistant

## 2015-12-15 ENCOUNTER — Ambulatory Visit (INDEPENDENT_AMBULATORY_CARE_PROVIDER_SITE_OTHER): Payer: Medicare Other | Admitting: Physician Assistant

## 2015-12-15 VITALS — BP 130/64 | HR 65 | Temp 97.3°F | Resp 16 | Ht 64.0 in | Wt 200.6 lb

## 2015-12-15 DIAGNOSIS — E039 Hypothyroidism, unspecified: Secondary | ICD-10-CM

## 2015-12-15 DIAGNOSIS — N183 Chronic kidney disease, stage 3 unspecified: Secondary | ICD-10-CM

## 2015-12-15 DIAGNOSIS — I872 Venous insufficiency (chronic) (peripheral): Secondary | ICD-10-CM

## 2015-12-15 DIAGNOSIS — Z0001 Encounter for general adult medical examination with abnormal findings: Secondary | ICD-10-CM | POA: Diagnosis not present

## 2015-12-15 DIAGNOSIS — E785 Hyperlipidemia, unspecified: Secondary | ICD-10-CM

## 2015-12-15 DIAGNOSIS — E559 Vitamin D deficiency, unspecified: Secondary | ICD-10-CM | POA: Diagnosis not present

## 2015-12-15 DIAGNOSIS — H3530 Unspecified macular degeneration: Secondary | ICD-10-CM | POA: Diagnosis not present

## 2015-12-15 DIAGNOSIS — E669 Obesity, unspecified: Secondary | ICD-10-CM

## 2015-12-15 DIAGNOSIS — M51369 Other intervertebral disc degeneration, lumbar region without mention of lumbar back pain or lower extremity pain: Secondary | ICD-10-CM

## 2015-12-15 DIAGNOSIS — M4712 Other spondylosis with myelopathy, cervical region: Secondary | ICD-10-CM

## 2015-12-15 DIAGNOSIS — R6889 Other general symptoms and signs: Secondary | ICD-10-CM | POA: Diagnosis not present

## 2015-12-15 DIAGNOSIS — Z79899 Other long term (current) drug therapy: Secondary | ICD-10-CM

## 2015-12-15 DIAGNOSIS — E1122 Type 2 diabetes mellitus with diabetic chronic kidney disease: Secondary | ICD-10-CM

## 2015-12-15 DIAGNOSIS — I10 Essential (primary) hypertension: Secondary | ICD-10-CM | POA: Diagnosis not present

## 2015-12-15 DIAGNOSIS — R300 Dysuria: Secondary | ICD-10-CM | POA: Diagnosis not present

## 2015-12-15 DIAGNOSIS — M5136 Other intervertebral disc degeneration, lumbar region: Secondary | ICD-10-CM

## 2015-12-15 DIAGNOSIS — E1142 Type 2 diabetes mellitus with diabetic polyneuropathy: Secondary | ICD-10-CM

## 2015-12-15 DIAGNOSIS — Z0000 Encounter for general adult medical examination without abnormal findings: Secondary | ICD-10-CM

## 2015-12-15 MED ORDER — CEFUROXIME AXETIL 250 MG PO TABS: 250.0000 mg | ORAL_TABLET | Freq: Two times a day (BID) | ORAL | Status: DC

## 2015-12-15 NOTE — Progress Notes (Signed)
MEDICARE ANNUAL WELLNESS VISIT AND FOLLOW UP  Assessment:   1. Essential hypertension - continue medications, DASH diet, exercise and monitor at home. Call if greater than 130/80.  - CBC with Differential/Platelet - BASIC METABOLIC PANEL WITH GFR - Hepatic function panel  2.  Dysuria Keflex, control sugar better, continue close follow up  3. Chronic venous insufficiency weight loss discussed, continue compression stockings and elevation  4. Hypothyroidism, unspecified hypothyroidism type - TSH  5. Type 2 diabetes mellitus with diabetic chronic kidney disease Discussed general issues about diabetes pathophysiology and management., Educational material distributed., Suggested low cholesterol diet., Encouraged aerobic exercise., Discussed foot care., Reminded to get yearly retinal exam. - Hemoglobin A1c  6. Hyperlipidemia -continue medications, check lipids, decrease fatty foods, increase activity.  - Lipid panel  7. Macular degeneration Cont follow up eye  8. Vitamin D deficiency - Vit D  25 hydroxy (rtn osteoporosis monitoring)  9. Medication management - Magnesium  10. Obesity Obesity with co morbidities- long discussion about weight loss, diet, and exercise  11. DDD (degenerative disc disease), lumbar RICE, NSAIDS, exercises given, if not better get xray and PT referral or ortho referral.   12. CKD (chronic kidney disease) stage 3, GFR 30-59 ml/min Increase fluids, avoid NSAIDS, monitor sugars, will monitor  13. DM polyneuropathy will increase gabapentin 2 bed time, 2 at lunch, and 1 at breakfast, decrease sugars      Plan:   During the course of the visit the patient was educated and counseled about appropriate screening and preventive services including:    Pneumococcal vaccine   Influenza vaccine  Td vaccine  Screening electrocardiogram  Screening mammography  Bone densitometry screening  Colorectal cancer screening  Diabetes  screening  Glaucoma screening  Nutrition counseling   Advanced directives: given info/requested  Conditions/risks identified: BMI: Discussed weight loss, diet, and increase physical activity.  Increase physical activity: AHA recommends 150 minutes of physical activity a week.  Medications reviewed DEXA- requested Diabetes is not at goal, ACE/ARB therapy: Yes. Urinary Incontinence is not an issue: discussed non pharmacology and pharmacology options.  Fall risk: moderate- discussed PT, home fall assessment, medications.    Subjective:   Catherine Wood is a 82 y.o. female who presents for Medicare Annual Wellness Visit and 3 month follow up on hypertension, diabetes, hyperlipidemia, vitamin D def.  Date of last medicare wellness was on 11/2015  Her blood pressure has been controlled at home, today their BP is   She does not workout. She denies chest pain, shortness of breath, dizziness.  She is on cholesterol medication and denies myalgias. Her cholesterol is at goal. The cholesterol last visit was:   Lab Results  Component Value Date   CHOL 166 09/07/2015   HDL 45* 09/07/2015   LDLCALC 67 09/07/2015   TRIG 271* 09/07/2015   CHOLHDL 3.7 09/07/2015   She has been working on diet and exercise for diabetes, she has Stage 3 CKD due to DM and complains of worsening bilateral feet paresthesias, she is on gabapentin, she is on Humalog 70/30 mix 23/18 BID recently switched, she is on bASA , she is on ARB and denies hypoglycemia , polydipsia and visual disturbances. She denies hypoglycemia, and sugars are running around 200 in the AM and in the evening too, did have over 300 one day. Last A1C in the office was:  Lab Results  Component Value Date   HGBA1C 7.1* 09/07/2015   She has been having some dysuria with initiation of urine stream,  she has had some frequency, has unchanged incontinence, has tried azo, helps some but not much. She denies flank pain, fever, chills, hematuria.  Patient is  on Vitamin D supplement. Lab Results  Component Value Date   VD25OH 23 09/07/2015     She is on thyroid medication. Her medication was not changed last visit. Patient denies nervousness, palpitations and weight changes.  Lab Results  Component Value Date   TSH 1.627 09/07/2015   Follows with Dr. Nelva Bush for lower back pain/ and gets injections with him in her left hip. Walks with cane.  She BMI is There is no weight on file to calculate BMI., she is working on diet and exercise and has done well.  Wt Readings from Last 3 Encounters:  12/08/15 198 lb 14.4 oz (90.22 kg)  11/09/15 197 lb 9.6 oz (89.631 kg)  11/02/15 201 lb 12.8 oz (91.536 kg)    Names of Other Physician/Practitioners you currently use: 1. [facility]- here for primary care 2. None, dentist, last visit 3 years Patient Care Team: [name], MD as PCP - General (Internal Medicine) Jeryl Columbia, MD as Consulting Physician (Gastroenterology) Lelon Perla, MD as Consulting Physician (Cardiology) Johna Sheriff, MD as Consulting Physician (Ophthalmology)- august 2016 Hurman Horn, MD as Consulting Physician (Ophthalmology) Deneise Lever, MD as Consulting Physician (Pulmonary Disease) Nelva Bush- gets left hip injections.   Medication Review Current Outpatient Prescriptions on File Prior to Visit  Medication Sig Dispense Refill  . aspirin 81 MG tablet Take 81 mg by mouth daily.    . Blood Glucose Monitoring Suppl (ACCU-CHEK AVIVA) device Check blood sugar 2 times daily-DX-E11.9 1 each 0  . Calcium Carbonate (CALCIUM 600 PO) Take 600 mg by mouth daily.    . Cholecalciferol (VITAMIN D3) 5000 UNITS CAPS Take 5,000 Units by mouth daily.     . fenofibrate 160 MG tablet TAKE 1 TABLET DAILY 30 tablet PRN  . furosemide (LASIX) 40 MG tablet TAKE 1 OR 2 TABLETS DAILY AS NEEDED FOR SWELLING/EDEMA 60 tablet 0  . gabapentin (NEURONTIN) 300 MG capsule 1 CAPSULE EACH MORNING, 2 EACH  AFTERNOON, AND 2 AT BEDTIME 150 capsule 1  . glucose blood (ACCU-CHEK AVIVA PLUS) test strip Check blood sugar 3 times daily-DX-E11.22. 100 each 6  . HUMALOG MIX 75/25 KWIKPEN (75-25) 100 UNIT/ML Kwikpen     . insulin lispro protamine-lispro (HUMALOG MIX 75/25) (75-25) 100 UNIT/ML SUSP injection Use 35-50 units bid as directed (Patient taking differently: Use 23-18 units bid as directed) 15 mL 11  . Insulin Pen Needle (PEN NEEDLES 29GX1/2") 29G X 12MM MISC USE 4 TIMES A DAY 100 each 99  . Lancets (ACCU-CHEK SOFT TOUCH) lancets Check blood sugar 2 times daily-DX-E11.9. 100 each 6  . levothyroxine (SYNTHROID, LEVOTHROID) 100 MCG tablet Take 1 tablet (100 mcg total) by mouth daily before breakfast. 90 tablet 0  . losartan (COZAAR) 100 MG tablet TAKE 1 TABLET ONCE A DAY FOR HIGH BLOOD PRESSURE 90 tablet 3  . Magnesium 400 MG CAPS Take by mouth 4 (four) times daily.    Marland Kitchen MEGARED OMEGA-3 KRILL OIL 500 MG CAPS Take 500 mg by mouth 2 (two) times daily.    . metFORMIN (GLUCOPHAGE) 1000 MG tablet TAKE (1) TABLET TWICE A DAY WITH MEALS (BREAKFAST AND SUPPER) 180 tablet 2  . Multiple Vitamins-Minerals (ICAPS PO) Take by mouth daily.    Marland Kitchen OVER THE COUNTER MEDICATION as needed. Macular Degeneration refresh eye drops    .  pravastatin (PRAVACHOL) 40 MG tablet TAKE 1 TABLET DAILY FOR CHOLESTEROL 90 tablet 3  . ranitidine (ZANTAC) 300 MG capsule Take 300 mg by mouth every evening.    . traMADol (ULTRAM) 50 MG tablet Take by mouth every 6 (six) hours as needed. Reported on 10/26/2015     No current facility-administered medications on file prior to visit.    Current Problems (verified) Patient Active Problem List   Diagnosis Date Noted  . Diabetes, polyneuropathy ([place]) 12/15/2015  . Cervical spondylosis with myelopathy 11/08/2014  . Obesity 10/20/2014  . DDD (degenerative disc disease), lumbar 10/20/2014  . CKD (chronic kidney disease) stage 3, GFR 30-59 ml/min 10/20/2014  . Chronic venous insufficiency  12/31/2013  . Vitamin D deficiency 12/30/2013  . Medication management 12/30/2013  . Hypertension   . Hypothyroid   . CKD stage 3 due to type 2 diabetes mellitus ([place])   . Hyperlipidemia   . Macular degeneration     Screening Tests Immunization History  Administered Date(s) Administered  . DT 10/20/2014  . Influenza-Unspecified 06/22/2013, 06/16/2014, 06/27/2014, 07/06/2015  . Pneumococcal Conjugate-13 04/05/2014  . Pneumococcal-Unspecified 09/09/2004  . Td 09/10/2003  . Zoster 09/09/2005    Preventative care: Last colonoscopy: 08/032016 Last mammogram: 02/2014 DUE this year Last pap smear/pelvic exam: remote  DEXA: 2013 MRI brain 10/2014  Prior vaccinations: TD or Tdap: 2016 Influenza: 2016 Pneumococcal: 2006  Prevnar 13 2015 Shingles/Zostavax: 2007  Allergies Allergies  Allergen Reactions  . Detrol [Tolterodine]    Surgical history Past Surgical History  Procedure Laterality Date  . Biopsy thyroid      benign thyroid nodule removal  . Varicose vein surgery    . Cataract extraction, bilateral    . Endometrial biopsy  2000  . Colonoscopy w/ biopsies and polypectomy    . Tubal ligation    . Anterior cervical decomp/discectomy fusion N/A 11/08/2014    Procedure: Cervical three-four,  Cervical six-seven anterior cervical decompression with fusion plating and bonegraft;  Surgeon: Ashok Pall, MD;  Location: [facility];  Service: Neurosurgery;  Laterality: N/A;  Cervical three-four,  Cervical six-seven anterior cervical decompression with fusion plating and bonegraft  . Spine surgery Left  11/08/2014    Left ACDF    Family history Family History  Problem Relation Age of Onset  . Cancer Mother     colon  . Cancer Father     GI   Tobacco Social History  Substance Use Topics  . Smoking status: Never Smoker   . Smokeless tobacco: Never Used  . Alcohol Use: No   She does not smoke.  Patient is not a former smoker. Are there smokers in your home (other  than you)?  No  Alcohol Current alcohol use: none  Caffeine Current caffeine use: coffee 2 /day  Exercise Current exercise: none  Nutrition/Diet Current diet: in general, a "healthy" diet    Cardiac risk factors: advanced age (older than 19 for men, 43 for women), diabetes mellitus, dyslipidemia, hypertension, obesity (BMI >= 30 kg/m2) and sedentary lifestyle.  Depression Screen (Note: if answer to either of the following is "Yes", a more complete depression screening is indicated)   Q1: Over the past two weeks, have you felt down, depressed or hopeless? No  Q2: Over the past two weeks, have you felt little interest or pleasure in doing things? No  Have you lost interest or pleasure in daily life? No  Do you often feel hopeless? No  Do you cry easily over simple problems? No  Activities of Daily Living In your present state of health, do you have any difficulty performing the following activities?:  Driving? No Managing money?  No Feeding yourself? No Getting from bed to chair? No Climbing a flight of stairs? Yes Preparing food and eating?: No Bathing or showering? No Getting dressed: No Getting to the toilet?  Yes Using the toilet:No Moving around from place to place: Yes, uses walker/cane In the past year have you fallen or had a near fall? Yes 2 weeks ago, no injury  Vision Difficulties: No  Hearing Difficulties: Yes per kids Do you often ask people to speak up or repeat themselves? Yes Do you experience ringing or noises in your ears? No Do you have difficulty understanding soft or whispered voices? Yes  Cognition  Do you feel that you have a problem with memory?No  Do you often misplace items? No  Do you feel safe at home?  Yes  Advanced directives Does patient have a [place]? No Does patient have a Living Will? No   Objective:   There were no vitals taken for this visit. There is no weight on file to calculate BMI.  General  appearance: alert, no distress, WD/WN,  female Cognitive Testing  Alert? Yes  Normal Appearance?Yes  Oriented to person? Yes  Place? Yes   Time? Yes  Recall of three objects?  Yes  Can perform simple calculations? Yes  Displays appropriate judgment?Yes  Can read the correct time from a watch face?Yes  HEENT: normocephalic, sclerae anicteric, TMs pearly, nares patent, no discharge or erythema, pharynx normal Oral cavity: MMM, no lesions Neck: supple, no lymphadenopathy, no thyromegaly, no masses Heart: RRR, normal S1, S2, no murmurs, occ PVC Lungs: CTA bilaterally, no wheezes, rhonchi, or rales Abdomen: +bs, soft, obese, non tender, non distended, no masses, no hepatomegaly, no splenomegaly Musculoskeletal: nontender, no swelling, no obvious deformity Extremities: no edema, no cyanosis, no clubbing Pulses: 2+ symmetric, upper and lower extremities, normal cap refill Neurological: alert, oriented x 3, CN2-12 intact, strength normal upper extremities and lower extremities, sensation decreased bilateral legs to mid shin, DTRs 2+ throughout, no cerebellar signs, gait normal, left arm with good ROM, good sensation, good pulses. Neck with decreased ROM due to pain, and + paraspinus tenderness bilateral, negative tenderness palpation at spinous process.  Psychiatric: normal affect, behavior normal, pleasant  Breast: defer Gyn: defer Rectal: defer   Medicare Attestation I have personally reviewed: The patient's medical and social history Their use of alcohol, tobacco or illicit drugs Their current medications and supplements The patient's functional ability including ADLs,fall risks, home safety risks, cognitive, and hearing and visual impairment Diet and physical activities Evidence for depression or mood disorders  The patient's weight, height, BMI, and visual acuity have been recorded in the chart.  I have made referrals, counseling, and provided education to the patient based on review  of the above and I have provided the patient with a written personalized care plan for preventive services.     Vicie Mutters, PA-C   12/15/2015

## 2015-12-15 NOTE — Patient Instructions (Addendum)
Increase your insulin to 26 in the morning and 20 at night If you have sugars below 150 in the morning for more than 3 days decrease night time insulin by 3 units.   If you have sugars less than 200 at night, decrease morning insulin by 3 units.   IF YOU EVER DO NOT EAT THAN DECREASE YOUR INSULINE AND BE VERY CAREFUL.    [facility]  7 a.m.-6:30 p.m., Monday 7 a.m.-5 p.m., Tuesday-Friday Schedule an appointment by calling [phone].  [facility] Schedule an appointment by calling [phone].  [employer] with no obligation # [phone] Do not have to be a member Tues-Sat 10-6  [place]- free test with no obligation # [phone] MUST BE A MEMBER Call for store hours  Have had patient's get good cheaper hearing aids from [facility] The air version has good reviews.

## 2015-12-16 LAB — URINALYSIS, MICROSCOPIC ONLY
Casts: NONE SEEN [LPF]
Crystals: NONE SEEN [HPF]
RBC / HPF: NONE SEEN RBC/HPF (ref <=2)
SQUAMOUS EPITHELIAL / LPF: NONE SEEN [HPF] (ref <=5)
Yeast: NONE SEEN [HPF]

## 2015-12-16 LAB — URINALYSIS, ROUTINE W REFLEX MICROSCOPIC
Bilirubin Urine: NEGATIVE
Glucose, UA: NEGATIVE
Ketones, ur: NEGATIVE
NITRITE: NEGATIVE
PH: 6.5 (ref 5.0–8.0)
SPECIFIC GRAVITY, URINE: 1.016 (ref 1.001–1.035)

## 2015-12-17 LAB — URINE CULTURE

## 2015-12-19 ENCOUNTER — Ambulatory Visit: Payer: Self-pay | Admitting: Physician Assistant

## 2015-12-19 DIAGNOSIS — H353134 Nonexudative age-related macular degeneration, bilateral, advanced atrophic with subfoveal involvement: Secondary | ICD-10-CM | POA: Diagnosis not present

## 2015-12-19 DIAGNOSIS — H353211 Exudative age-related macular degeneration, right eye, with active choroidal neovascularization: Secondary | ICD-10-CM | POA: Diagnosis not present

## 2015-12-19 DIAGNOSIS — H353221 Exudative age-related macular degeneration, left eye, with active choroidal neovascularization: Secondary | ICD-10-CM | POA: Diagnosis not present

## 2015-12-20 ENCOUNTER — Other Ambulatory Visit: Payer: Self-pay | Admitting: Internal Medicine

## 2015-12-21 ENCOUNTER — Ambulatory Visit
Admission: RE | Admit: 2015-12-21 | Discharge: 2015-12-21 | Disposition: A | Discharge status: Home / Self Care | Payer: Medicare Other | Source: Ambulatory Visit

## 2015-12-21 ENCOUNTER — Other Ambulatory Visit: Payer: Self-pay | Admitting: Physician Assistant

## 2015-12-21 DIAGNOSIS — Z1231 Encounter for screening mammogram for malignant neoplasm of breast: Secondary | ICD-10-CM

## 2015-12-21 MED ORDER — SULFAMETHOXAZOLE-TRIMETHOPRIM 800-160 MG PO TABS: 1.0000 | ORAL_TABLET | Freq: Two times a day (BID) | ORAL | Status: DC

## 2015-12-28 ENCOUNTER — Other Ambulatory Visit: Payer: Self-pay

## 2015-12-28 ENCOUNTER — Ambulatory Visit (INDEPENDENT_AMBULATORY_CARE_PROVIDER_SITE_OTHER): Payer: Medicare Other | Admitting: Physician Assistant

## 2015-12-28 ENCOUNTER — Encounter: Payer: Self-pay | Admitting: Physician Assistant

## 2015-12-28 ENCOUNTER — Ambulatory Visit (HOSPITAL_COMMUNITY)
Admission: RE | Admit: 2015-12-28 | Discharge: 2015-12-28 | Disposition: A | Discharge status: Home / Self Care | Payer: Medicare Other | Source: Ambulatory Visit | Attending: Physician Assistant | Admitting: Physician Assistant

## 2015-12-28 VITALS — BP 128/66 | HR 81 | Temp 97.7°F | Resp 16 | Ht 64.0 in | Wt 200.6 lb

## 2015-12-28 DIAGNOSIS — E039 Hypothyroidism, unspecified: Secondary | ICD-10-CM | POA: Diagnosis not present

## 2015-12-28 DIAGNOSIS — E785 Hyperlipidemia, unspecified: Secondary | ICD-10-CM | POA: Diagnosis not present

## 2015-12-28 DIAGNOSIS — R0600 Dyspnea, unspecified: Secondary | ICD-10-CM

## 2015-12-28 DIAGNOSIS — E559 Vitamin D deficiency, unspecified: Secondary | ICD-10-CM | POA: Diagnosis not present

## 2015-12-28 DIAGNOSIS — I10 Essential (primary) hypertension: Secondary | ICD-10-CM | POA: Diagnosis not present

## 2015-12-28 DIAGNOSIS — Z79899 Other long term (current) drug therapy: Secondary | ICD-10-CM

## 2015-12-28 DIAGNOSIS — N183 Chronic kidney disease, stage 3 unspecified: Secondary | ICD-10-CM

## 2015-12-28 DIAGNOSIS — R350 Frequency of micturition: Secondary | ICD-10-CM | POA: Diagnosis not present

## 2015-12-28 DIAGNOSIS — E1122 Type 2 diabetes mellitus with diabetic chronic kidney disease: Secondary | ICD-10-CM | POA: Diagnosis not present

## 2015-12-28 DIAGNOSIS — E669 Obesity, unspecified: Secondary | ICD-10-CM

## 2015-12-28 DIAGNOSIS — E1129 Type 2 diabetes mellitus with other diabetic kidney complication: Secondary | ICD-10-CM | POA: Diagnosis not present

## 2015-12-28 LAB — HEPATIC FUNCTION PANEL
ALBUMIN: 4 g/dL (ref 3.6–5.1)
ALK PHOS: 54 U/L (ref 33–130)
ALT: 17 U/L (ref 6–29)
AST: 15 U/L (ref 10–35)
BILIRUBIN INDIRECT: 0.2 mg/dL (ref 0.2–1.2)
BILIRUBIN TOTAL: 0.3 mg/dL (ref 0.2–1.2)
Bilirubin, Direct: 0.1 mg/dL (ref <=0.2)
Total Protein: 6.7 g/dL (ref 6.1–8.1)

## 2015-12-28 LAB — MAGNESIUM: Magnesium: 1.5 mg/dL (ref 1.5–2.5)

## 2015-12-28 LAB — CBC WITH DIFFERENTIAL/PLATELET
BASOS ABS: 84 {cells}/uL (ref 0–200)
Basophils Relative: 1 %
EOS PCT: 6 %
Eosinophils Absolute: 504 cells/uL — ABNORMAL HIGH (ref 15–500)
HCT: 36.9 % (ref 35.0–45.0)
HEMOGLOBIN: 11.7 g/dL (ref 11.7–15.5)
LYMPHS ABS: 2100 {cells}/uL (ref 850–3900)
LYMPHS PCT: 25 %
MCH: 27.5 pg (ref 27.0–33.0)
MCHC: 31.7 g/dL — ABNORMAL LOW (ref 32.0–36.0)
MCV: 86.6 fL (ref 80.0–100.0)
MPV: 10.5 fL (ref 7.5–12.5)
Monocytes Absolute: 756 cells/uL (ref 200–950)
Monocytes Relative: 9 %
NEUTROS PCT: 59 %
Neutro Abs: 4956 cells/uL (ref 1500–7800)
Platelets: 311 10*3/uL (ref 140–400)
RBC: 4.26 MIL/uL (ref 3.80–5.10)
RDW: 15.2 % — AB (ref 11.0–15.0)
WBC: 8.4 10*3/uL (ref 3.8–10.8)

## 2015-12-28 LAB — LIPID PANEL
CHOLESTEROL: 178 mg/dL (ref 125–200)
HDL: 42 mg/dL — AB (ref >=46)
LDL Cholesterol: 78 mg/dL (ref <130)
TRIGLYCERIDES: 288 mg/dL — AB (ref <150)
Total CHOL/HDL Ratio: 4.2 Ratio (ref <=5.0)
VLDL: 58 mg/dL — ABNORMAL HIGH (ref <30)

## 2015-12-28 LAB — BASIC METABOLIC PANEL WITH GFR
BUN: 24 mg/dL (ref 7–25)
CHLORIDE: 96 mmol/L — AB (ref 98–110)
CO2: 29 mmol/L (ref 20–31)
Calcium: 9.8 mg/dL (ref 8.6–10.4)
Creat: 0.96 mg/dL — ABNORMAL HIGH (ref 0.60–0.88)
GFR, Est African American: 63 mL/min (ref >=60)
GFR, Est Non African American: 55 mL/min — ABNORMAL LOW (ref >=60)
Glucose, Bld: 202 mg/dL — ABNORMAL HIGH (ref 65–99)
POTASSIUM: 4.6 mmol/L (ref 3.5–5.3)
Sodium: 136 mmol/L (ref 135–146)

## 2015-12-28 LAB — HEMOGLOBIN A1C
Hgb A1c MFr Bld: 8.5 % — ABNORMAL HIGH (ref <5.7)
Mean Plasma Glucose: 197 mg/dL

## 2015-12-28 LAB — TSH: TSH: 1.65 mIU/L

## 2015-12-28 NOTE — Patient Instructions (Addendum)
Increase fluid pill twice a day for 2-3 days and cut the losartan in half while you are on the two pills of the lasix only, then go back up Get chest xray  Increase you insulin to 30 in the AM and 25 at night REMEMBER THE NIGHT TIME INSULIN AFFECTS YOUR MORNING SUGAR  YOUR MORNING INSULIN AFFECTS YOUR NIGHT TIME SUGAR YOU HAVE TO EAT WITH THIS INSULIN  If your morning sugar is always below 100 then the issue is with your sugar spiking after meals. Try to take your blood sugar approximately 2 hours after eating, this number should be less than 200. If it is not, think about the foods that you ate and better choices you can make.     Your A1C is a measure of your sugar over the past 3 months and is not affected by what you have eaten over the past few days. Diabetes increases your chances of stroke and heart attack over 300 % and is the leading cause of blindness and kidney failure in the [place]. Please make sure you decrease bad carbs like white bread, white rice, potatoes, corn, soft drinks, pasta, cereals, refined sugars, sweet tea, dried fruits, and fruit juice. Good carbs are okay to eat in moderation like sweet potatoes, brown rice, whole grain pasta/bread, most fruit (except dried fruit) and you can eat as many veggies as you want.   Greater than 6.5 is considered diabetic. Between 6.4 and 5.7 is prediabetic If your A1C is less than 5.7 you are NOT diabetic.  Targets for Glucose Readings: Time of Check Target for patients WITHOUT Diabetes Target for DIABETICS  Before Meals Less than 100  less than 150  Two hours after meals Less than 200  Less than 250       Bad carbs also include fruit juice, alcohol, and sweet tea. These are empty calories that do not signal to your brain that you are full.   Please remember the good carbs are still carbs which convert into sugar. So please measure them out no more than 1/2-1 cup of rice, oatmeal, pasta, and beans  Veggies are however free  foods! Pile them on.   Not all fruit is created equal. Please see the list below, the fruit at the bottom is higher in sugars than the fruit at the top. Please avoid all dried fruits.

## 2015-12-28 NOTE — Progress Notes (Signed)
Patient ID: Catherine Wood, female   DOB: 1930/09/21, 82 y.o.   MRN: [id number]  Assessment and Plan:.  1. Essential hypertension - CBC with Differential/Platelet - BASIC METABOLIC PANEL WITH GFR - Hepatic function panel  2. Hypothyroidism, unspecified hypothyroidism type - TSH  3. CKD stage 3 due to type 2 diabetes mellitus (HCC) - BASIC METABOLIC PANEL WITH GFR - Hemoglobin A1c - increase insulin to 30 and 25 - stop dried fruits, strict diet  4. CKD (chronic kidney disease) stage 3, GFR 30-59 ml/min - BASIC METABOLIC PANEL WITH GFR - Hemoglobin A1c  5. Obesity Obesity with co morbidities- long discussion about weight loss, diet, and exercise  6. Vitamin D deficiency - VITAMIN D 25 Hydroxy (Vit-D Deficiency, Fractures)  7. Hyperlipidemia - Lipid panel  8. Medication management - Magnesium  9. Dyspnea ? From allergies, no CP, check CXR and BNP, + edema will increase lasix x 2-3 days, monitor BP/weight, go to ER if worse or any CP - DG Chest 2 View; Future - Brain natriuretic peptide [id number]  10. Urinary frequency - Urinalysis, Routine w reflex microscopic (not at [facility]) - Urine culture   Continue diet and meds as discussed. Further disposition pending results of labs. Discussed med's effects and SE's.    HPI 82 y.o. female  presents for 3 month follow up with hypertension, hyperlipidemia, diabetes and vitamin D deficiency.   Her blood pressure has been controlled at home, today their BP is BP: 128/66 mmHg.She does not workout. She denies chest pain. She has had some SOB last 2-3 days with allergies, some increasing swelling, on lasix daily, no orthopnea/PND.  Wt Readings from Last 3 Encounters:  12/28/15 200 lb 9.6 oz (90.992 kg)  12/15/15 200 lb 9.6 oz (90.992 kg)  12/08/15 198 lb 14.4 oz (90.22 kg)     She is on cholesterol medication and denies myalgias. Her cholesterol is at goal. The cholesterol was:  09/07/2015: Cholesterol 166; HDL 45*; LDL Cholesterol 67;  Triglycerides 271*   She has been working on diet and exercise for diabetes with diabetic chronic kidney disease, she is on bASA, she is on ACE/ARB, and denies  foot ulcerations, hyperglycemia, hypoglycemia , increased appetite, nausea, paresthesia of the feet, polydipsia, polyuria, visual disturbances, vomiting and weight loss. Last A1C was: 09/07/2015: Hgb A1c MFr Bld 7.1*.  Patient reports that her blood sugar was 80 this morning.  She reports that her BS was 117 after eating. She is on 26 in th AM and 20 at night, sugars have been 200's in the AM. She was treated for UTI on 12/21/2015, not having any issues at this time.    Patient is on Vitamin D supplement. 09/07/2015: Vit D, 25-Hydroxy 43  Current Medications:  Current Outpatient Prescriptions on File Prior to Visit  Medication Sig Dispense Refill  . aspirin 81 MG tablet Take 81 mg by mouth daily.    . Blood Glucose Monitoring Suppl (ACCU-CHEK AVIVA) device Check blood sugar 2 times daily-DX-E11.9 1 each 0  . Calcium Carbonate (CALCIUM 600 PO) Take 600 mg by mouth daily.    . Cholecalciferol (VITAMIN D3) 5000 UNITS CAPS Take 5,000 Units by mouth daily.     . fenofibrate 160 MG tablet TAKE 1 TABLET DAILY 30 tablet PRN  . furosemide (LASIX) 40 MG tablet TAKE 1 OR 2 TABLETS DAILY AS NEEDED FOR SWELLING/EDEMA 60 tablet 0  . gabapentin (NEURONTIN) 300 MG capsule 1 CAPSULE EACH MORNING, 2 EACH AFTERNOON, AND 2 AT BEDTIME 150  capsule 1  . glucose blood (ACCU-CHEK AVIVA PLUS) test strip Check blood sugar 3 times daily-DX-E11.22. 100 each 6  . HUMALOG MIX 75/25 KWIKPEN (75-25) 100 UNIT/ML Kwikpen     . insulin lispro protamine-lispro (HUMALOG MIX 75/25) (75-25) 100 UNIT/ML SUSP injection Use 35-50 units bid as directed (Patient taking differently: Use 23-18 units bid as directed) 15 mL 11  . Insulin Pen Needle (PEN NEEDLES 29GX1/2") 29G X 12MM MISC USE 4 TIMES A DAY 100 each 99  . Lancets (ACCU-CHEK SOFT TOUCH) lancets Check blood sugar 2 times  daily-DX-E11.9. 100 each 6  . levothyroxine (SYNTHROID, LEVOTHROID) 100 MCG tablet TAKE (1) TABLET DAILY BE- FORE BREAKFAST. 90 tablet 0  . losartan (COZAAR) 100 MG tablet TAKE 1 TABLET ONCE A DAY FOR HIGH BLOOD PRESSURE 90 tablet 3  . Magnesium 400 MG CAPS Take by mouth 4 (four) times daily.    Marland Kitchen MEGARED OMEGA-3 KRILL OIL 500 MG CAPS Take 500 mg by mouth 2 (two) times daily.    . metFORMIN (GLUCOPHAGE) 1000 MG tablet TAKE (1) TABLET TWICE A DAY WITH MEALS (BREAKFAST AND SUPPER) 180 tablet 2  . Multiple Vitamins-Minerals (ICAPS PO) Take by mouth daily.    Marland Kitchen OVER THE COUNTER MEDICATION as needed. Macular Degeneration refresh eye drops    . pravastatin (PRAVACHOL) 40 MG tablet TAKE 1 TABLET DAILY FOR CHOLESTEROL 90 tablet 3  . ranitidine (ZANTAC) 300 MG capsule Take 300 mg by mouth every evening.    . sulfamethoxazole-trimethoprim (BACTRIM DS,SEPTRA DS) 800-160 MG tablet Take 1 tablet by mouth 2 (two) times daily. 10 tablet 0  . traMADol (ULTRAM) 50 MG tablet Take by mouth every 6 (six) hours as needed. Reported on 10/26/2015     No current facility-administered medications on file prior to visit.   Medical History:  Past Medical History  Diagnosis Date  . Hypertension   . Hypothyroid   . IDDM (insulin dependent diabetes mellitus) ([place])   . Hyperlipidemia   . Macular degeneration   . Varicose veins   . Leg swelling   . Shortness of breath dyspnea     with exertion  . Sleep apnea     does not wear CPAP  . GERD (gastroesophageal reflux disease)   . HOH (hard of hearing)   . Wears glasses    Allergies:  Allergies  Allergen Reactions  . Detrol [Tolterodine]      Review of Systems:  Review of Systems  Constitutional: Negative for fever, chills and malaise/fatigue.  HENT: Negative for congestion, sore throat and tinnitus.   Respiratory: Positive for cough and shortness of breath. Negative for hemoptysis, sputum production and wheezing.   Cardiovascular: Positive for leg swelling.  Negative for chest pain, palpitations, orthopnea, claudication and PND.  Gastrointestinal: Negative for heartburn, diarrhea, constipation, blood in stool and melena.  Genitourinary: Negative.   Skin: Negative.   Neurological: Negative for dizziness, loss of consciousness and headaches.  Psychiatric/Behavioral: Negative for depression. The patient is not nervous/anxious and does not have insomnia.     Family history- Review and unchanged  Social history- Review and unchanged  Physical Exam: BP 128/66 mmHg  Pulse 81  Temp(Src) 97.7 F (36.5 C) (Temporal)  Resp 16  Ht 5\' 4"  (1.626 m)  Wt 200 lb 9.6 oz (90.992 kg)  BMI 34.42 kg/m2  SpO2 96% Wt Readings from Last 3 Encounters:  12/28/15 200 lb 9.6 oz (90.992 kg)  12/15/15 200 lb 9.6 oz (90.992 kg)  12/08/15 198 lb 14.4 oz (  90.22 kg)   General Appearance: Well nourished well developed, non-toxic appearing, in no apparent distress. Eyes: PERRLA, EOMs, conjunctiva no swelling or erythema ENT/Mouth: Ear canals clear with no erythema, swelling, or discharge.  TMs normal bilaterally, oropharynx clear, moist, with no exudate.   Neck: Supple, thyroid normal, no JVD, no cervical adenopathy.  Respiratory: Respiratory effort normal, breath sounds clear A&P, no wheeze, rhonchi or rales noted.  No retractions, no accessory muscle usage Cardio: RRR with no MRGs. + 2+ edema bilateral legs..  Abdomen: Soft, + BS.  Non tender, no guarding, rebound, hernias, masses. Musculoskeletal: Full ROM, 5/5 strength, Normal gait Skin: Warm, dry without rashes, lesions, ecchymosis.  Neuro: Awake and oriented X 3, Cranial nerves intact. No cerebellar symptoms.  Psych: normal affect, Insight and Judgment appropriate.    Vicie Mutters, PA-C 11:37 AM [facility]

## 2015-12-29 LAB — URINALYSIS, ROUTINE W REFLEX MICROSCOPIC
Bilirubin Urine: NEGATIVE
HGB URINE DIPSTICK: NEGATIVE
Ketones, ur: NEGATIVE
LEUKOCYTES UA: NEGATIVE
NITRITE: NEGATIVE
PROTEIN: NEGATIVE
Specific Gravity, Urine: 1.013 (ref 1.001–1.035)
pH: 7.5 (ref 5.0–8.0)

## 2015-12-29 LAB — VITAMIN D 25 HYDROXY (VIT D DEFICIENCY, FRACTURES): VIT D 25 HYDROXY: 42 ng/mL (ref 30–100)

## 2015-12-29 LAB — URINE CULTURE
Colony Count: NO GROWTH
ORGANISM ID, BACTERIA: NO GROWTH

## 2015-12-29 LAB — BRAIN NATRIURETIC PEPTIDE: BRAIN NATRIURETIC PEPTIDE: 41.5 pg/mL (ref <100)

## 2016-01-04 ENCOUNTER — Other Ambulatory Visit: Payer: Self-pay | Admitting: Physician Assistant

## 2016-01-04 ENCOUNTER — Other Ambulatory Visit: Payer: Self-pay | Admitting: Internal Medicine

## 2016-01-11 DIAGNOSIS — M4317 Spondylolisthesis, lumbosacral region: Secondary | ICD-10-CM | POA: Diagnosis not present

## 2016-01-11 DIAGNOSIS — M9905 Segmental and somatic dysfunction of pelvic region: Secondary | ICD-10-CM | POA: Diagnosis not present

## 2016-01-11 DIAGNOSIS — M9903 Segmental and somatic dysfunction of lumbar region: Secondary | ICD-10-CM | POA: Diagnosis not present

## 2016-01-11 DIAGNOSIS — Q72812 Congenital shortening of left lower limb: Secondary | ICD-10-CM | POA: Diagnosis not present

## 2016-01-11 DIAGNOSIS — M25552 Pain in left hip: Secondary | ICD-10-CM | POA: Diagnosis not present

## 2016-01-15 DIAGNOSIS — M9903 Segmental and somatic dysfunction of lumbar region: Secondary | ICD-10-CM | POA: Diagnosis not present

## 2016-01-15 DIAGNOSIS — M9905 Segmental and somatic dysfunction of pelvic region: Secondary | ICD-10-CM | POA: Diagnosis not present

## 2016-01-15 DIAGNOSIS — Q72812 Congenital shortening of left lower limb: Secondary | ICD-10-CM | POA: Diagnosis not present

## 2016-01-15 DIAGNOSIS — M25552 Pain in left hip: Secondary | ICD-10-CM | POA: Diagnosis not present

## 2016-01-15 DIAGNOSIS — M4317 Spondylolisthesis, lumbosacral region: Secondary | ICD-10-CM | POA: Diagnosis not present

## 2016-01-16 DIAGNOSIS — M4317 Spondylolisthesis, lumbosacral region: Secondary | ICD-10-CM | POA: Diagnosis not present

## 2016-01-16 DIAGNOSIS — Q72812 Congenital shortening of left lower limb: Secondary | ICD-10-CM | POA: Diagnosis not present

## 2016-01-16 DIAGNOSIS — M9905 Segmental and somatic dysfunction of pelvic region: Secondary | ICD-10-CM | POA: Diagnosis not present

## 2016-01-16 DIAGNOSIS — M9903 Segmental and somatic dysfunction of lumbar region: Secondary | ICD-10-CM | POA: Diagnosis not present

## 2016-01-16 DIAGNOSIS — M25552 Pain in left hip: Secondary | ICD-10-CM | POA: Diagnosis not present

## 2016-01-17 ENCOUNTER — Other Ambulatory Visit: Payer: Self-pay | Admitting: *Deleted

## 2016-01-17 MED ORDER — GLUCOSE BLOOD VI STRP: ORAL_STRIP | Status: DC

## 2016-01-17 MED ORDER — ACCU-CHEK SOFT TOUCH LANCETS MISC: Status: DC

## 2016-01-19 DIAGNOSIS — M9905 Segmental and somatic dysfunction of pelvic region: Secondary | ICD-10-CM | POA: Diagnosis not present

## 2016-01-19 DIAGNOSIS — M4317 Spondylolisthesis, lumbosacral region: Secondary | ICD-10-CM | POA: Diagnosis not present

## 2016-01-19 DIAGNOSIS — Q72812 Congenital shortening of left lower limb: Secondary | ICD-10-CM | POA: Diagnosis not present

## 2016-01-19 DIAGNOSIS — M25552 Pain in left hip: Secondary | ICD-10-CM | POA: Diagnosis not present

## 2016-01-19 DIAGNOSIS — M9903 Segmental and somatic dysfunction of lumbar region: Secondary | ICD-10-CM | POA: Diagnosis not present

## 2016-01-22 DIAGNOSIS — M9903 Segmental and somatic dysfunction of lumbar region: Secondary | ICD-10-CM | POA: Diagnosis not present

## 2016-01-22 DIAGNOSIS — M9905 Segmental and somatic dysfunction of pelvic region: Secondary | ICD-10-CM | POA: Diagnosis not present

## 2016-01-22 DIAGNOSIS — M4317 Spondylolisthesis, lumbosacral region: Secondary | ICD-10-CM | POA: Diagnosis not present

## 2016-01-22 DIAGNOSIS — M25552 Pain in left hip: Secondary | ICD-10-CM | POA: Diagnosis not present

## 2016-01-22 DIAGNOSIS — Q72812 Congenital shortening of left lower limb: Secondary | ICD-10-CM | POA: Diagnosis not present

## 2016-01-23 DIAGNOSIS — M4317 Spondylolisthesis, lumbosacral region: Secondary | ICD-10-CM | POA: Diagnosis not present

## 2016-01-23 DIAGNOSIS — M25552 Pain in left hip: Secondary | ICD-10-CM | POA: Diagnosis not present

## 2016-01-23 DIAGNOSIS — M9903 Segmental and somatic dysfunction of lumbar region: Secondary | ICD-10-CM | POA: Diagnosis not present

## 2016-01-23 DIAGNOSIS — M9905 Segmental and somatic dysfunction of pelvic region: Secondary | ICD-10-CM | POA: Diagnosis not present

## 2016-01-23 DIAGNOSIS — Q72812 Congenital shortening of left lower limb: Secondary | ICD-10-CM | POA: Diagnosis not present

## 2016-01-25 DIAGNOSIS — M25552 Pain in left hip: Secondary | ICD-10-CM | POA: Diagnosis not present

## 2016-01-25 DIAGNOSIS — M9905 Segmental and somatic dysfunction of pelvic region: Secondary | ICD-10-CM | POA: Diagnosis not present

## 2016-01-25 DIAGNOSIS — M9903 Segmental and somatic dysfunction of lumbar region: Secondary | ICD-10-CM | POA: Diagnosis not present

## 2016-01-25 DIAGNOSIS — M4317 Spondylolisthesis, lumbosacral region: Secondary | ICD-10-CM | POA: Diagnosis not present

## 2016-01-25 DIAGNOSIS — Q72812 Congenital shortening of left lower limb: Secondary | ICD-10-CM | POA: Diagnosis not present

## 2016-01-29 DIAGNOSIS — M4317 Spondylolisthesis, lumbosacral region: Secondary | ICD-10-CM | POA: Diagnosis not present

## 2016-01-29 DIAGNOSIS — M9903 Segmental and somatic dysfunction of lumbar region: Secondary | ICD-10-CM | POA: Diagnosis not present

## 2016-01-29 DIAGNOSIS — Q72812 Congenital shortening of left lower limb: Secondary | ICD-10-CM | POA: Diagnosis not present

## 2016-01-29 DIAGNOSIS — M25552 Pain in left hip: Secondary | ICD-10-CM | POA: Diagnosis not present

## 2016-01-29 DIAGNOSIS — M9905 Segmental and somatic dysfunction of pelvic region: Secondary | ICD-10-CM | POA: Diagnosis not present

## 2016-01-30 ENCOUNTER — Encounter: Payer: Self-pay | Admitting: Physician Assistant

## 2016-01-30 ENCOUNTER — Ambulatory Visit (INDEPENDENT_AMBULATORY_CARE_PROVIDER_SITE_OTHER): Payer: Medicare Other | Admitting: Physician Assistant

## 2016-01-30 VITALS — BP 140/70 | HR 87 | Temp 97.7°F | Resp 14 | Ht 64.0 in | Wt 194.4 lb

## 2016-01-30 DIAGNOSIS — Q72812 Congenital shortening of left lower limb: Secondary | ICD-10-CM | POA: Diagnosis not present

## 2016-01-30 DIAGNOSIS — M9905 Segmental and somatic dysfunction of pelvic region: Secondary | ICD-10-CM | POA: Diagnosis not present

## 2016-01-30 DIAGNOSIS — M9903 Segmental and somatic dysfunction of lumbar region: Secondary | ICD-10-CM | POA: Diagnosis not present

## 2016-01-30 DIAGNOSIS — L989 Disorder of the skin and subcutaneous tissue, unspecified: Secondary | ICD-10-CM | POA: Diagnosis not present

## 2016-01-30 DIAGNOSIS — J208 Acute bronchitis due to other specified organisms: Secondary | ICD-10-CM | POA: Diagnosis not present

## 2016-01-30 DIAGNOSIS — M4317 Spondylolisthesis, lumbosacral region: Secondary | ICD-10-CM | POA: Diagnosis not present

## 2016-01-30 DIAGNOSIS — M25552 Pain in left hip: Secondary | ICD-10-CM | POA: Diagnosis not present

## 2016-01-30 DIAGNOSIS — E1142 Type 2 diabetes mellitus with diabetic polyneuropathy: Secondary | ICD-10-CM | POA: Diagnosis not present

## 2016-01-30 MED ORDER — AZITHROMYCIN 250 MG PO TABS: ORAL_TABLET | ORAL | Status: AC

## 2016-01-30 MED ORDER — PROMETHAZINE-CODEINE 6.25-10 MG/5ML PO SYRP: 5.0000 mL | ORAL_SOLUTION | Freq: Four times a day (QID) | ORAL | Status: DC | PRN

## 2016-01-30 NOTE — Progress Notes (Signed)
Subjective:    Patient ID: Catherine Wood, female    DOB: October 03, 1930, 82 y.o.   MRN: [id number]  HPI 82 y.o. WF presents with cough x 1 week, was getting better and then worse. She states she has had cough with green/gray mucus, some wheezing but no CP, SOB denies fever, chills.  She states for last 2 weeks her sugars have been in 200;s in the AM, she is on 30 at night and 25 in PM of humolog. CXR 04/20, normal. Weight is down. Sugars were at 30 and 30 at one point until she had low blood sugars several months ago due to dieting.   Wt Readings from Last 3 Encounters:  01/30/16 194 lb 6.4 oz (88.179 kg)  12/28/15 200 lb 9.6 oz (90.992 kg)  12/15/15 200 lb 9.6 oz (90.992 kg)   Non healing ulcers on chest x months, some erythematous areas on face and one on tip of nose. Will refer to derm per patient request.   Blood pressure 140/70, pulse 87, temperature 97.7 F (36.5 C), temperature source Temporal, resp. rate 14, height 5\' 4"  (1.626 m), weight 194 lb 6.4 oz (88.179 kg), SpO2 95 %. Past Medical History  Diagnosis Date  . Hypertension   . Hypothyroid   . IDDM (insulin dependent diabetes mellitus) ([place])   . Hyperlipidemia   . Macular degeneration   . Varicose veins   . Leg swelling   . Shortness of breath dyspnea     with exertion  . Sleep apnea     does not wear CPAP  . GERD (gastroesophageal reflux disease)   . HOH (hard of hearing)   . Wears glasses    Current Outpatient Prescriptions on File Prior to Visit  Medication Sig Dispense Refill  . aspirin 81 MG tablet Take 81 mg by mouth daily.    . Blood Glucose Monitoring Suppl (ACCU-CHEK AVIVA) device Check blood sugar 2 times daily-DX-E11.9 1 each 0  . Calcium Carbonate (CALCIUM 600 PO) Take 600 mg by mouth daily.    . Cholecalciferol (VITAMIN D3) 5000 UNITS CAPS Take 5,000 Units by mouth daily.     . fenofibrate 160 MG tablet TAKE 1 TABLET DAILY 30 tablet PRN  . furosemide (LASIX) 40 MG tablet TAKE 1 OR 2 TABLETS DAILY AS NEEDED  FOR SWELLING/EDEMA 60 tablet 0  . gabapentin (NEURONTIN) 300 MG capsule 1 CAPSULE EACH MORNING, 2 EACH AFTERNOON, AND 2 AT BEDTIME 150 capsule 1  . glucose blood (ACCU-CHEK AVIVA PLUS) test strip Check blood sugar 3 times daily-DX-E11.22. 100 each 6  . HUMALOG MIX 75/25 KWIKPEN (75-25) 100 UNIT/ML Kwikpen     . insulin lispro protamine-lispro (HUMALOG MIX 75/25) (75-25) 100 UNIT/ML SUSP injection Use 35-50 units bid as directed (Patient taking differently: Use 23-18 units bid as directed) 15 mL 11  . Insulin Pen Needle (PEN NEEDLES 29GX1/2") 29G X 12MM MISC USE 4 TIMES A DAY 100 each 99  . Lancets (ACCU-CHEK SOFT TOUCH) lancets Check blood sugar 3 times daily-DX-E11.22. 100 each 6  . levothyroxine (SYNTHROID, LEVOTHROID) 100 MCG tablet TAKE (1) TABLET DAILY BE- FORE BREAKFAST. 90 tablet 0  . losartan (COZAAR) 100 MG tablet TAKE 1 TABLET ONCE A DAY FOR HIGH BLOOD PRESSURE 90 tablet 3  . Magnesium 400 MG CAPS Take by mouth 4 (four) times daily.    Marland Kitchen MEGARED OMEGA-3 KRILL OIL 500 MG CAPS Take 500 mg by mouth 2 (two) times daily.    . metFORMIN (GLUCOPHAGE) 1000 MG  tablet TAKE (1) TABLET TWICE A DAY WITH MEALS (BREAKFAST AND SUPPER) 180 tablet 2  . Multiple Vitamins-Minerals (ICAPS PO) Take by mouth daily.    Marland Kitchen OVER THE COUNTER MEDICATION as needed. Macular Degeneration refresh eye drops    . pravastatin (PRAVACHOL) 40 MG tablet TAKE 1 TABLET DAILY FOR CHOLESTEROL 90 tablet 3  . ranitidine (ZANTAC) 300 MG capsule Take 300 mg by mouth every evening.    . sulfamethoxazole-trimethoprim (BACTRIM DS,SEPTRA DS) 800-160 MG tablet Take 1 tablet by mouth 2 (two) times daily. 10 tablet 0  . traMADol (ULTRAM) 50 MG tablet Take by mouth every 6 (six) hours as needed. Reported on 10/26/2015     No current facility-administered medications on file prior to visit.     Review of Systems  Constitutional: Positive for fatigue. Negative for fever and chills.  HENT: Positive for congestion. Negative for dental  problem, ear discharge, ear pain, nosebleeds, rhinorrhea, sinus pressure, sore throat, trouble swallowing and voice change.   Respiratory: Positive for cough, shortness of breath and wheezing. Negative for chest tightness.   Cardiovascular: Positive for leg swelling. Negative for chest pain and palpitations.  Gastrointestinal: Negative.   Genitourinary: Negative.   Musculoskeletal: Negative.   Neurological: Negative.        Objective:   Physical Exam  Constitutional: She is oriented to person, place, and time. She appears well-developed and well-nourished. No distress.  HENT:  Head: Normocephalic.  Mouth/Throat: Oropharynx is clear and moist. No oropharyngeal exudate.  Eyes: Conjunctivae are normal. No scleral icterus.  Neck: Normal range of motion. Neck supple. No JVD present. No thyromegaly present.  Cardiovascular: Normal rate, regular rhythm, normal heart sounds and intact distal pulses.  Exam reveals no gallop and no friction rub.   No murmur heard. Pulmonary/Chest: Effort normal. No respiratory distress. She has wheezes (LLL). She has no rales. She exhibits no tenderness.  Abdominal: Soft. Bowel sounds are normal. She exhibits no distension and no mass. There is no tenderness. There is no rebound and no guarding.  Musculoskeletal: Normal range of motion.  Lymphadenopathy:    She has no cervical adenopathy.  Neurological: She is alert and oriented to person, place, and time.  Skin: Skin is warm and dry. She is not diaphoretic.  Erythematous macules on left temple/near ear, left nose, and anterior chest.   Psychiatric: She has a normal mood and affect. Her behavior is normal. Judgment and thought content normal.  Vitals reviewed.      Assessment & Plan:  Bronchitis- zpak, breo, codeine cough syrup, if not better follow up in the office or if worse go to the ER, patient expressed understandings.  Diabetes- ? Worse from infection however will increase sugars to 30 and 30 units,  diet Skin neoplasm- send to derm to rule out/treat for possible squamous/basal  Future Appointments Date Time Provider [place]  03/26/2016 11:00 AM [name], MD [facility] None

## 2016-01-30 NOTE — Patient Instructions (Signed)
Increase insulin to 30 and 30 units Monitor sugars, decrease night time insulin if morning sugar is below 150 Please get up in the middle of the night around 12-1 AM and check your sugars  Can do samples steroid inhaler if do not tolerate oral steroids, do 1 puff twice a day and wash mouth out afterwards to avoid yeast.   HOW TO TREAT VIRAL COUGH AND COLD SYMPTOMS:  -Symptoms usually last at least 1 week with the worst symptoms being around day 4.  - colds usually start with a sore throat and end with a cough, and the cough can take 2 weeks to get better.  -No antibiotics are needed for colds, flu, sore throats, cough, bronchitis UNLESS symptoms are longer than 7 days OR if you are getting better then get drastically worse.  -There are a lot of combination medications (Dayquil, Nyquil, Vicks 44, tyelnol cold and sinus, ETC). Please look at the ingredients on the back so that you are treating the correct symptoms and not doubling up on medications/ingredients.    Medicines you can use  Nasal congestion  - pseudoephedrine (Sudafed)- behind the counter, do not use if you have high blood pressure, medicine that have -D in them.  - phenylephrine (Sudafed PE) -Dextormethorphan + chlorpheniramine (Coridcidin HBP)- okay if you have high blood pressure -Oxymetazoline (Afrin) nasal spray- LIMIT to 3 days -Saline nasal spray -Neti pot (used distilled or bottled water)  Ear pain/congestion  -pseudoephedrine (sudafed) - Nasonex/flonase nasal spray  Fever  -Acetaminophen (Tyelnol) -Ibuprofen (Advil, motrin, aleve)  Sore Throat  -Acetaminophen (Tyelnol) -Ibuprofen (Advil, motrin, aleve) -Drink a lot of water -Gargle with salt water - Rest your voice (don't talk) -Throat sprays -Cough drops  Body Aches  -Acetaminophen (Tyelnol) -Ibuprofen (Advil, motrin, aleve)  Headache  -Acetaminophen (Tyelnol) -Ibuprofen (Advil, motrin, aleve) - Exedrin, Exedrin Migraine  Allergy symptoms (cough,  sneeze, runny nose, itchy eyes) -Claritin or loratadine cheapest but likely the weakest  -Zyrtec or certizine at night because it can make you sleepy -The strongest is allegra or fexafinadine  Cheapest at [employer], [employer], [employer]  Cough  -Dextromethorphan (Delsym)- medicine that has DM in it -Guafenesin (Mucinex/Robitussin) - cough drops - drink lots of water  Chest Congestion  -Guafenesin (Mucinex/Robitussin)  Red Itchy Eyes  - Naphcon-A  Upset Stomach  - Bland diet (nothing spicy, greasy, fried, and high acid foods like tomatoes, oranges, berries) -OKAY- cereal, bread, soup, crackers, rice -Eat smaller more frequent meals -reduce caffeine, no alcohol -Loperamide (Imodium-AD) if diarrhea -Prevacid for heart burn  General health when sick  -Hydration -wash your hands frequently -keep surfaces clean -change pillow cases and sheets often -Get fresh air but do not exercise strenuously -Vitamin D, double up on it - Vitamin C -Zinc

## 2016-02-01 DIAGNOSIS — M4317 Spondylolisthesis, lumbosacral region: Secondary | ICD-10-CM | POA: Diagnosis not present

## 2016-02-01 DIAGNOSIS — M25552 Pain in left hip: Secondary | ICD-10-CM | POA: Diagnosis not present

## 2016-02-01 DIAGNOSIS — H353221 Exudative age-related macular degeneration, left eye, with active choroidal neovascularization: Secondary | ICD-10-CM | POA: Diagnosis not present

## 2016-02-01 DIAGNOSIS — H35341 Macular cyst, hole, or pseudohole, right eye: Secondary | ICD-10-CM | POA: Diagnosis not present

## 2016-02-01 DIAGNOSIS — H353134 Nonexudative age-related macular degeneration, bilateral, advanced atrophic with subfoveal involvement: Secondary | ICD-10-CM | POA: Diagnosis not present

## 2016-02-01 DIAGNOSIS — M9905 Segmental and somatic dysfunction of pelvic region: Secondary | ICD-10-CM | POA: Diagnosis not present

## 2016-02-01 DIAGNOSIS — Q72812 Congenital shortening of left lower limb: Secondary | ICD-10-CM | POA: Diagnosis not present

## 2016-02-01 DIAGNOSIS — M9903 Segmental and somatic dysfunction of lumbar region: Secondary | ICD-10-CM | POA: Diagnosis not present

## 2016-02-06 DIAGNOSIS — M9905 Segmental and somatic dysfunction of pelvic region: Secondary | ICD-10-CM | POA: Diagnosis not present

## 2016-02-06 DIAGNOSIS — M9903 Segmental and somatic dysfunction of lumbar region: Secondary | ICD-10-CM | POA: Diagnosis not present

## 2016-02-06 DIAGNOSIS — M4317 Spondylolisthesis, lumbosacral region: Secondary | ICD-10-CM | POA: Diagnosis not present

## 2016-02-06 DIAGNOSIS — Q72812 Congenital shortening of left lower limb: Secondary | ICD-10-CM | POA: Diagnosis not present

## 2016-02-06 DIAGNOSIS — M25552 Pain in left hip: Secondary | ICD-10-CM | POA: Diagnosis not present

## 2016-02-07 DIAGNOSIS — M9905 Segmental and somatic dysfunction of pelvic region: Secondary | ICD-10-CM | POA: Diagnosis not present

## 2016-02-07 DIAGNOSIS — M4317 Spondylolisthesis, lumbosacral region: Secondary | ICD-10-CM | POA: Diagnosis not present

## 2016-02-07 DIAGNOSIS — M25552 Pain in left hip: Secondary | ICD-10-CM | POA: Diagnosis not present

## 2016-02-07 DIAGNOSIS — M9903 Segmental and somatic dysfunction of lumbar region: Secondary | ICD-10-CM | POA: Diagnosis not present

## 2016-02-07 DIAGNOSIS — Q72812 Congenital shortening of left lower limb: Secondary | ICD-10-CM | POA: Diagnosis not present

## 2016-02-08 DIAGNOSIS — M25552 Pain in left hip: Secondary | ICD-10-CM | POA: Diagnosis not present

## 2016-02-08 DIAGNOSIS — Q72812 Congenital shortening of left lower limb: Secondary | ICD-10-CM | POA: Diagnosis not present

## 2016-02-08 DIAGNOSIS — M4317 Spondylolisthesis, lumbosacral region: Secondary | ICD-10-CM | POA: Diagnosis not present

## 2016-02-08 DIAGNOSIS — M9903 Segmental and somatic dysfunction of lumbar region: Secondary | ICD-10-CM | POA: Diagnosis not present

## 2016-02-08 DIAGNOSIS — M9905 Segmental and somatic dysfunction of pelvic region: Secondary | ICD-10-CM | POA: Diagnosis not present

## 2016-02-12 DIAGNOSIS — M9905 Segmental and somatic dysfunction of pelvic region: Secondary | ICD-10-CM | POA: Diagnosis not present

## 2016-02-12 DIAGNOSIS — M4317 Spondylolisthesis, lumbosacral region: Secondary | ICD-10-CM | POA: Diagnosis not present

## 2016-02-12 DIAGNOSIS — M9903 Segmental and somatic dysfunction of lumbar region: Secondary | ICD-10-CM | POA: Diagnosis not present

## 2016-02-12 DIAGNOSIS — Q72812 Congenital shortening of left lower limb: Secondary | ICD-10-CM | POA: Diagnosis not present

## 2016-02-12 DIAGNOSIS — M25552 Pain in left hip: Secondary | ICD-10-CM | POA: Diagnosis not present

## 2016-02-13 ENCOUNTER — Other Ambulatory Visit: Payer: Self-pay | Admitting: Internal Medicine

## 2016-02-15 DIAGNOSIS — Q72812 Congenital shortening of left lower limb: Secondary | ICD-10-CM | POA: Diagnosis not present

## 2016-02-15 DIAGNOSIS — D485 Neoplasm of uncertain behavior of skin: Secondary | ICD-10-CM | POA: Diagnosis not present

## 2016-02-15 DIAGNOSIS — D1801 Hemangioma of skin and subcutaneous tissue: Secondary | ICD-10-CM | POA: Diagnosis not present

## 2016-02-15 DIAGNOSIS — L821 Other seborrheic keratosis: Secondary | ICD-10-CM | POA: Diagnosis not present

## 2016-02-15 DIAGNOSIS — M9903 Segmental and somatic dysfunction of lumbar region: Secondary | ICD-10-CM | POA: Diagnosis not present

## 2016-02-15 DIAGNOSIS — Z85828 Personal history of other malignant neoplasm of skin: Secondary | ICD-10-CM | POA: Diagnosis not present

## 2016-02-15 DIAGNOSIS — M4317 Spondylolisthesis, lumbosacral region: Secondary | ICD-10-CM | POA: Diagnosis not present

## 2016-02-15 DIAGNOSIS — M25552 Pain in left hip: Secondary | ICD-10-CM | POA: Diagnosis not present

## 2016-02-15 DIAGNOSIS — L570 Actinic keratosis: Secondary | ICD-10-CM | POA: Diagnosis not present

## 2016-02-15 DIAGNOSIS — L309 Dermatitis, unspecified: Secondary | ICD-10-CM | POA: Diagnosis not present

## 2016-02-15 DIAGNOSIS — M9905 Segmental and somatic dysfunction of pelvic region: Secondary | ICD-10-CM | POA: Diagnosis not present

## 2016-02-19 DIAGNOSIS — M9905 Segmental and somatic dysfunction of pelvic region: Secondary | ICD-10-CM | POA: Diagnosis not present

## 2016-02-19 DIAGNOSIS — M4317 Spondylolisthesis, lumbosacral region: Secondary | ICD-10-CM | POA: Diagnosis not present

## 2016-02-19 DIAGNOSIS — M9903 Segmental and somatic dysfunction of lumbar region: Secondary | ICD-10-CM | POA: Diagnosis not present

## 2016-02-19 DIAGNOSIS — Q72812 Congenital shortening of left lower limb: Secondary | ICD-10-CM | POA: Diagnosis not present

## 2016-02-19 DIAGNOSIS — M25552 Pain in left hip: Secondary | ICD-10-CM | POA: Diagnosis not present

## 2016-02-20 DIAGNOSIS — L84 Corns and callosities: Secondary | ICD-10-CM | POA: Diagnosis not present

## 2016-02-20 DIAGNOSIS — E1142 Type 2 diabetes mellitus with diabetic polyneuropathy: Secondary | ICD-10-CM | POA: Diagnosis not present

## 2016-02-20 DIAGNOSIS — B351 Tinea unguium: Secondary | ICD-10-CM | POA: Diagnosis not present

## 2016-02-22 DIAGNOSIS — Q72812 Congenital shortening of left lower limb: Secondary | ICD-10-CM | POA: Diagnosis not present

## 2016-02-22 DIAGNOSIS — M9903 Segmental and somatic dysfunction of lumbar region: Secondary | ICD-10-CM | POA: Diagnosis not present

## 2016-02-22 DIAGNOSIS — M4317 Spondylolisthesis, lumbosacral region: Secondary | ICD-10-CM | POA: Diagnosis not present

## 2016-02-22 DIAGNOSIS — M9905 Segmental and somatic dysfunction of pelvic region: Secondary | ICD-10-CM | POA: Diagnosis not present

## 2016-02-22 DIAGNOSIS — M25552 Pain in left hip: Secondary | ICD-10-CM | POA: Diagnosis not present

## 2016-02-26 DIAGNOSIS — M9905 Segmental and somatic dysfunction of pelvic region: Secondary | ICD-10-CM | POA: Diagnosis not present

## 2016-02-26 DIAGNOSIS — M25552 Pain in left hip: Secondary | ICD-10-CM | POA: Diagnosis not present

## 2016-02-26 DIAGNOSIS — Q72812 Congenital shortening of left lower limb: Secondary | ICD-10-CM | POA: Diagnosis not present

## 2016-02-26 DIAGNOSIS — M4317 Spondylolisthesis, lumbosacral region: Secondary | ICD-10-CM | POA: Diagnosis not present

## 2016-02-26 DIAGNOSIS — M9903 Segmental and somatic dysfunction of lumbar region: Secondary | ICD-10-CM | POA: Diagnosis not present

## 2016-02-27 ENCOUNTER — Other Ambulatory Visit: Payer: Self-pay | Admitting: Internal Medicine

## 2016-03-04 DIAGNOSIS — M9905 Segmental and somatic dysfunction of pelvic region: Secondary | ICD-10-CM | POA: Diagnosis not present

## 2016-03-04 DIAGNOSIS — M25552 Pain in left hip: Secondary | ICD-10-CM | POA: Diagnosis not present

## 2016-03-04 DIAGNOSIS — Q72812 Congenital shortening of left lower limb: Secondary | ICD-10-CM | POA: Diagnosis not present

## 2016-03-04 DIAGNOSIS — M4317 Spondylolisthesis, lumbosacral region: Secondary | ICD-10-CM | POA: Diagnosis not present

## 2016-03-04 DIAGNOSIS — M9903 Segmental and somatic dysfunction of lumbar region: Secondary | ICD-10-CM | POA: Diagnosis not present

## 2016-03-05 DIAGNOSIS — H353221 Exudative age-related macular degeneration, left eye, with active choroidal neovascularization: Secondary | ICD-10-CM | POA: Diagnosis not present

## 2016-03-05 DIAGNOSIS — M9903 Segmental and somatic dysfunction of lumbar region: Secondary | ICD-10-CM | POA: Diagnosis not present

## 2016-03-05 DIAGNOSIS — M9905 Segmental and somatic dysfunction of pelvic region: Secondary | ICD-10-CM | POA: Diagnosis not present

## 2016-03-05 DIAGNOSIS — Q72812 Congenital shortening of left lower limb: Secondary | ICD-10-CM | POA: Diagnosis not present

## 2016-03-05 DIAGNOSIS — M4317 Spondylolisthesis, lumbosacral region: Secondary | ICD-10-CM | POA: Diagnosis not present

## 2016-03-05 DIAGNOSIS — M25552 Pain in left hip: Secondary | ICD-10-CM | POA: Diagnosis not present

## 2016-03-07 DIAGNOSIS — M4317 Spondylolisthesis, lumbosacral region: Secondary | ICD-10-CM | POA: Diagnosis not present

## 2016-03-07 DIAGNOSIS — Q72812 Congenital shortening of left lower limb: Secondary | ICD-10-CM | POA: Diagnosis not present

## 2016-03-07 DIAGNOSIS — L570 Actinic keratosis: Secondary | ICD-10-CM | POA: Diagnosis not present

## 2016-03-07 DIAGNOSIS — M25552 Pain in left hip: Secondary | ICD-10-CM | POA: Diagnosis not present

## 2016-03-07 DIAGNOSIS — M9903 Segmental and somatic dysfunction of lumbar region: Secondary | ICD-10-CM | POA: Diagnosis not present

## 2016-03-07 DIAGNOSIS — M9905 Segmental and somatic dysfunction of pelvic region: Secondary | ICD-10-CM | POA: Diagnosis not present

## 2016-03-11 ENCOUNTER — Encounter: Payer: Self-pay | Admitting: Internal Medicine

## 2016-03-11 DIAGNOSIS — M25552 Pain in left hip: Secondary | ICD-10-CM | POA: Diagnosis not present

## 2016-03-11 DIAGNOSIS — M4317 Spondylolisthesis, lumbosacral region: Secondary | ICD-10-CM | POA: Diagnosis not present

## 2016-03-11 DIAGNOSIS — M9903 Segmental and somatic dysfunction of lumbar region: Secondary | ICD-10-CM | POA: Diagnosis not present

## 2016-03-11 DIAGNOSIS — Q72812 Congenital shortening of left lower limb: Secondary | ICD-10-CM | POA: Diagnosis not present

## 2016-03-11 DIAGNOSIS — M9905 Segmental and somatic dysfunction of pelvic region: Secondary | ICD-10-CM | POA: Diagnosis not present

## 2016-03-19 DIAGNOSIS — M5416 Radiculopathy, lumbar region: Secondary | ICD-10-CM | POA: Diagnosis not present

## 2016-03-19 DIAGNOSIS — Z79891 Long term (current) use of opiate analgesic: Secondary | ICD-10-CM | POA: Diagnosis not present

## 2016-03-19 DIAGNOSIS — M5136 Other intervertebral disc degeneration, lumbar region: Secondary | ICD-10-CM | POA: Diagnosis not present

## 2016-03-20 ENCOUNTER — Other Ambulatory Visit: Payer: Self-pay | Admitting: Internal Medicine

## 2016-03-26 ENCOUNTER — Encounter: Payer: Self-pay | Admitting: Internal Medicine

## 2016-03-26 ENCOUNTER — Ambulatory Visit (INDEPENDENT_AMBULATORY_CARE_PROVIDER_SITE_OTHER): Payer: Medicare Other | Admitting: Internal Medicine

## 2016-03-26 VITALS — BP 136/80 | HR 88 | Temp 97.2°F | Resp 16 | Ht 65.0 in | Wt 195.6 lb

## 2016-03-26 DIAGNOSIS — I10 Essential (primary) hypertension: Secondary | ICD-10-CM | POA: Diagnosis not present

## 2016-03-26 DIAGNOSIS — Z79899 Other long term (current) drug therapy: Secondary | ICD-10-CM | POA: Diagnosis not present

## 2016-03-26 DIAGNOSIS — E039 Hypothyroidism, unspecified: Secondary | ICD-10-CM | POA: Diagnosis not present

## 2016-03-26 DIAGNOSIS — Z0001 Encounter for general adult medical examination with abnormal findings: Secondary | ICD-10-CM | POA: Diagnosis not present

## 2016-03-26 DIAGNOSIS — E785 Hyperlipidemia, unspecified: Secondary | ICD-10-CM | POA: Diagnosis not present

## 2016-03-26 DIAGNOSIS — E1122 Type 2 diabetes mellitus with diabetic chronic kidney disease: Secondary | ICD-10-CM

## 2016-03-26 DIAGNOSIS — E559 Vitamin D deficiency, unspecified: Secondary | ICD-10-CM

## 2016-03-26 DIAGNOSIS — N183 Chronic kidney disease, stage 3 unspecified: Secondary | ICD-10-CM

## 2016-03-26 DIAGNOSIS — R6889 Other general symptoms and signs: Secondary | ICD-10-CM

## 2016-03-26 DIAGNOSIS — Z1212 Encounter for screening for malignant neoplasm of rectum: Secondary | ICD-10-CM

## 2016-03-26 DIAGNOSIS — Z136 Encounter for screening for cardiovascular disorders: Secondary | ICD-10-CM

## 2016-03-26 LAB — CBC WITH DIFFERENTIAL/PLATELET
BASOS ABS: 71 {cells}/uL (ref 0–200)
Basophils Relative: 1 %
EOS ABS: 497 {cells}/uL (ref 15–500)
EOS PCT: 7 %
HCT: 39 % (ref 35.0–45.0)
HEMOGLOBIN: 12.5 g/dL (ref 11.7–15.5)
LYMPHS ABS: 2272 {cells}/uL (ref 850–3900)
Lymphocytes Relative: 32 %
MCH: 28.1 pg (ref 27.0–33.0)
MCHC: 32.1 g/dL (ref 32.0–36.0)
MCV: 87.6 fL (ref 80.0–100.0)
MPV: 10.8 fL (ref 7.5–12.5)
Monocytes Absolute: 710 cells/uL (ref 200–950)
Monocytes Relative: 10 %
NEUTROS ABS: 3550 {cells}/uL (ref 1500–7800)
NEUTROS PCT: 50 %
Platelets: 318 10*3/uL (ref 140–400)
RBC: 4.45 MIL/uL (ref 3.80–5.10)
RDW: 13.4 % (ref 11.0–15.0)
WBC: 7.1 10*3/uL (ref 3.8–10.8)

## 2016-03-26 LAB — BASIC METABOLIC PANEL WITH GFR
BUN: 22 mg/dL (ref 7–25)
CALCIUM: 9.6 mg/dL (ref 8.6–10.4)
CHLORIDE: 96 mmol/L — AB (ref 98–110)
CO2: 29 mmol/L (ref 20–31)
CREATININE: 1.07 mg/dL — AB (ref 0.60–0.88)
GFR, Est African American: 55 mL/min — ABNORMAL LOW (ref >=60)
GFR, Est Non African American: 48 mL/min — ABNORMAL LOW (ref >=60)
GLUCOSE: 306 mg/dL — AB (ref 65–99)
Potassium: 4.9 mmol/L (ref 3.5–5.3)
SODIUM: 135 mmol/L (ref 135–146)

## 2016-03-26 LAB — MAGNESIUM: MAGNESIUM: 1.8 mg/dL (ref 1.5–2.5)

## 2016-03-26 LAB — LIPID PANEL
CHOL/HDL RATIO: 5.1 ratio — AB (ref <=5.0)
CHOLESTEROL: 204 mg/dL — AB (ref 125–200)
HDL: 40 mg/dL — ABNORMAL LOW (ref >=46)
LDL Cholesterol: 106 mg/dL (ref <130)
Triglycerides: 289 mg/dL — ABNORMAL HIGH (ref <150)
VLDL: 58 mg/dL — ABNORMAL HIGH (ref <30)

## 2016-03-26 LAB — HEPATIC FUNCTION PANEL
ALBUMIN: 4.1 g/dL (ref 3.6–5.1)
ALT: 14 U/L (ref 6–29)
AST: 16 U/L (ref 10–35)
Alkaline Phosphatase: 48 U/L (ref 33–130)
BILIRUBIN DIRECT: 0.1 mg/dL (ref <=0.2)
Indirect Bilirubin: 0.2 mg/dL (ref 0.2–1.2)
TOTAL PROTEIN: 6.4 g/dL (ref 6.1–8.1)
Total Bilirubin: 0.3 mg/dL (ref 0.2–1.2)

## 2016-03-26 LAB — TSH: TSH: 2.7 m[IU]/L

## 2016-03-26 NOTE — Patient Instructions (Signed)
Recommend Adult Low Dose Aspirin or   coated  Aspirin 81 mg daily   To reduce risk of Colon Cancer 20 %,   Skin Cancer 26 % ,   Melanoma 46%   and   Pancreatic cancer 60%   ++++++++++++++++++++++++++++++++++++++++++++++++++++++ Vitamin D goal   is between 70-100.   Please make sure that you are taking your Vitamin D as directed.   It is very important as a natural anti-inflammatory   helping hair, skin, and nails, as well as reducing stroke and heart attack risk.   It helps your bones and helps with mood.  It also decreases numerous cancer risks so please take it as directed.   Low Vit D is associated with a 200-300% higher risk for CANCER   and 200-300% higher risk for HEART   ATTACK  &  STROKE.   .....................................Marland Kitchen  It is also associated with higher death rate at younger ages,   autoimmune diseases like Rheumatoid arthritis, Lupus, Multiple Sclerosis.     Also many other serious conditions, like depression, Alzheimer's  Dementia, infertility, muscle aches, fatigue, fibromyalgia - just to name a few.  ++++++++++++++++++++++++++++++++++++++++++++++++  Recommend the book "The END of DIETING" by Dr Excell Seltzer   & the book "The END of DIABETES " by Dr Excell Seltzer  At [email or web address] - get book & Audio CD's     Being diabetic has a  300% increased risk for heart attack, stroke, cancer, and alzheimer- type vascular dementia. It is very important that you work harder with diet by avoiding all foods that are white. Avoid white rice (brown & wild rice is OK), white potatoes (sweetpotatoes in moderation is OK), White bread or wheat bread or anything made out of white flour like bagels, donuts, rolls, buns, biscuits, cakes, pastries, cookies, pizza crust, and pasta (made from white flour & egg whites) - vegetarian pasta or spinach or wheat pasta is OK. Multigrain breads like Arnold's or Pepperidge [employer], or multigrain sandwich thins or flatbreads.  Diet,  exercise and weight loss can reverse and cure diabetes in the early stages.  Diet, exercise and weight loss is very important in the control and prevention of complications of diabetes which affects every system in your body, ie. Brain - dementia/stroke, eyes - glaucoma/blindness, heart - heart attack/heart failure, kidneys - dialysis, stomach - gastric paralysis, intestines - malabsorption, nerves - severe painful neuritis, circulation - gangrene & loss of a leg(s), and finally cancer and Alzheimers.    I recommend avoid fried & greasy foods,  sweets/candy, white rice (brown or wild rice or Quinoa is OK), white potatoes (sweet potatoes are OK) - anything made from white flour - bagels, doughnuts, rolls, buns, biscuits,white and wheat breads, pizza crust and traditional pasta made of white flour & egg white(vegetarian pasta or spinach or wheat pasta is OK).  Multi-grain bread is OK - like multi-grain flat bread or sandwich thins. Avoid alcohol in excess. Exercise is also important.    Eat all the vegetables you want - avoid meat, especially red meat and dairy - especially cheese.  Cheese is the most concentrated form of trans-fats which is the worst thing to clog up our arteries. Veggie cheese is OK which can be found in the fresh produce section at [employer] or [employer] or [employer]  ++++++++++++++++++++++++++++++++++++++++++++++++++ DASH Eating Plan  DASH stands for "Dietary Approaches to Stop Hypertension."   The DASH eating plan is a healthy eating plan that has been shown to reduce high blood  pressure (hypertension). Additional health benefits may include reducing the risk of type 2 diabetes mellitus, heart disease, and stroke. The DASH eating plan may also help with weight loss.  WHAT DO I NEED TO KNOW ABOUT THE DASH EATING PLAN?  For the DASH eating plan, you will follow these general guidelines:  Choose foods with a percent daily value for sodium of less than 5% (as listed on the food  label).  Use salt-free seasonings or herbs instead of table salt or sea salt.  Check with your health care provider or pharmacist before using salt substitutes.  Eat lower-sodium products, often labeled as "lower sodium" or "no salt added."  Eat fresh foods.  Eat more vegetables, fruits, and low-fat dairy products.    Choose whole grains. Look for the word "whole" as the first word in the ingredient list.  Choose fish   Limit sweets, desserts, sugars, and sugary drinks.  Choose heart-healthy fats.  Eat veggie cheese   Eat more home-cooked food and less restaurant, buffet, and fast food.  Limit fried foods.  [place]uffaker foods using methods other than frying.  Limit canned vegetables. If you do use them, rinse them well to decrease the sodium.  When eating at a restaurant, ask that your food be prepared with less salt, or no salt if possible.                      WHAT FOODS CAN I EAT?  Read Dr Fara Olden Fuhrman's books on The End of Dieting & The End of Diabetes  Grains  Whole grain or whole wheat bread. Brown rice. Whole grain or whole wheat pasta. Quinoa, bulgur, and whole grain cereals. Low-sodium cereals. Corn or whole wheat flour tortillas. Whole grain cornbread. Whole grain crackers. Low-sodium crackers.  Vegetables  Fresh or frozen vegetables (raw, steamed, roasted, or grilled). Low-sodium or reduced-sodium tomato and vegetable juices. Low-sodium or reduced-sodium tomato sauce and paste. Low-sodium or reduced-sodium canned vegetables.   Fruits  All fresh, canned (in natural juice), or frozen fruits.  Protein Products   All fish and seafood.  Dried beans, peas, or lentils. Unsalted nuts and seeds. Unsalted canned beans.  Dairy  Low-fat dairy products, such as skim or 1% milk, 2% or reduced-fat cheeses, low-fat ricotta or cottage cheese, or plain low-fat yogurt. Low-sodium or reduced-sodium cheeses.  Fats and Oils  Tub margarines without trans fats. Light or  reduced-fat mayonnaise and salad dressings (reduced sodium). Avocado. Safflower, olive, or canola oils. Natural peanut or almond butter.  Other  Unsalted popcorn and pretzels. The items listed above may not be a complete list of recommended foods or beverages. Contact your dietitian for more options.  +++++++++++++++++++++++++++++++++++++++++++  WHAT FOODS ARE NOT RECOMMENDED?  Grains/ White flour or wheat flour  White bread. White pasta. White rice. Refined cornbread. Bagels and croissants. Crackers that contain trans fat.  Vegetables  Creamed or fried vegetables. Vegetables in a . Regular canned vegetables. Regular canned tomato sauce and paste. Regular tomato and vegetable juices.  Fruits  Dried fruits. Canned fruit in light or heavy syrup. Fruit juice.  Meat and Other Protein Products  Meat in general - RED mwaet & White meat.  Fatty cuts of meat. Ribs, chicken wings, bacon, sausage, bologna, salami, chitterlings, fatback, hot dogs, bratwurst, and packaged luncheon meats.  Dairy  Whole or 2% milk, cream, half-and-half, and cream cheese. Whole-fat or sweetened yogurt. Full-fat cheeses or blue cheese. Nondairy creamers and whipped toppings. Processed cheese, cheese spreads, or  cheese curds.  Condiments  Onion and garlic salt, seasoned salt, table salt, and sea salt. Canned and packaged gravies. Worcestershire sauce. Tartar sauce. Barbecue sauce. Teriyaki sauce. Soy sauce, including reduced sodium. Steak sauce. Fish sauce. Oyster sauce. Cocktail sauce. Horseradish. Ketchup and mustard. Meat flavorings and tenderizers. Bouillon cubes. Hot sauce. Tabasco sauce. Marinades. Taco seasonings. Relishes.  Fats and Oils Butter, stick margarine, lard, shortening and bacon fat. Coconut, palm kernel, or palm oils. Regular salad dressings.  Pickles and olives. Salted popcorn and pretzels.  The items listed above may not be a complete list of foods and beverages to avoid.   Preventive  Care for Adults  A healthy lifestyle and preventive care can promote health and wellness. Preventive health guidelines for women include the following key practices.  A routine yearly physical is a good way to check with your health care provider about your health and preventive screening. It is a chance to share any concerns and updates on your health and to receive a thorough exam.  Visit your dentist for a routine exam and preventive care every 6 months. Brush your teeth twice a day and floss once a day. Good oral hygiene prevents tooth decay and gum disease.  The frequency of eye exams is based on your age, health, family medical history, use of contact lenses, and other factors. Follow your health care provider's recommendations for frequency of eye exams.  Eat a healthy diet. Foods like vegetables, fruits, whole grains, low-fat dairy products, and lean protein foods contain the nutrients you need without too many calories. Decrease your intake of foods high in solid fats, added sugars, and salt. Eat the right amount of calories for you.Get information about a proper diet from your health care provider, if necessary.  Regular physical exercise is one of the most important things you can do for your health. Most adults should get at least 150 minutes of moderate-intensity exercise (any activity that increases your heart rate and causes you to sweat) each week. In addition, most adults need muscle-strengthening exercises on 2 or more days a week.  Maintain a healthy weight. The body mass index (BMI) is a screening tool to identify possible weight problems. It provides an estimate of body fat based on height and weight. Your health care provider can find your BMI and can help you achieve or maintain a healthy weight.For adults 20 years and older:  A BMI below 18.5 is considered underweight.  A BMI of 18.5 to 24.9 is normal.  A BMI of 25 to 29.9 is considered overweight.  A BMI of 30 and  above is considered obese.  Maintain normal blood lipids and cholesterol levels by exercising and minimizing your intake of saturated fat. Eat a balanced diet with plenty of fruit and vegetables. If your lipid or cholesterol levels are high, you are over 50, or you are at high risk for heart disease, you may need your cholesterol levels checked more frequently.Ongoing high lipid and cholesterol levels should be treated with medicines if diet and exercise are not working.  If you smoke, find out from your health care provider how to quit. If you do not use tobacco, do not start.  Lung cancer screening is recommended for adults aged 55-80 years who are at high risk for developing lung cancer because of a history of smoking. A yearly low-dose CT scan of the lungs is recommended for people who have at least a 30-pack-year history of smoking and are a current smoker or   have quit within the past 15 years. A pack year of smoking is smoking an average of 1 pack of cigarettes a day for 1 year (for example: 1 pack a day for 30 years or 2 packs a day for 15 years). Yearly screening should continue until the smoker has stopped smoking for at least 15 years. Yearly screening should be stopped for people who develop a health problem that would prevent them from having lung cancer treatment.  Avoid use of street drugs. Do not share needles with anyone. Ask for help if you need support or instructions about stopping the use of drugs.  High blood pressure causes heart disease and increases the risk of stroke.  Ongoing high blood pressure should be treated with medicines if weight loss and exercise do not work.  If you are 55-79 years old, ask your health care provider if you should take aspirin to prevent strokes.  Diabetes screening involves taking a blood sample to check your fasting blood sugar level. This should be done once every 3 years, after age 45, if you are within normal weight and without risk factors for  diabetes. Testing should be considered at a younger age or be carried out more frequently if you are overweight and have at least 1 risk factor for diabetes.  Breast cancer screening is essential preventive care for women. You should practice "breast self-awareness." This means understanding the normal appearance and feel of your breasts and may include breast self-examination. Any changes detected, no matter how small, should be reported to a health care provider. Women in their 20s and 30s should have a clinical breast exam (CBE) by a health care provider as part of a regular health exam every 1 to 3 years. After age 40, women should have a CBE every year. Starting at age 40, women should consider having a mammogram (breast X-ray test) every year. Women who have a family history of breast cancer should talk to their health care provider about genetic screening. Women at a high risk of breast cancer should talk to their health care providers about having an MRI and a mammogram every year.  Breast cancer gene (BRCA)-related cancer risk assessment is recommended for women who have family members with BRCA-related cancers. BRCA-related cancers include breast, ovarian, tubal, and peritoneal cancers. Having family members with these cancers may be associated with an increased risk for harmful changes (mutations) in the breast cancer genes BRCA1 and BRCA2. Results of the assessment will determine the need for genetic counseling and BRCA1 and BRCA2 testing.  Routine pelvic exams to screen for cancer are no longer recommended for nonpregnant women who are considered low risk for cancer of the pelvic organs (ovaries, uterus, and vagina) and who do not have symptoms. Ask your health care provider if a screening pelvic exam is right for you.  If you have had past treatment for cervical cancer or a condition that could lead to cancer, you need Pap tests and screening for cancer for at least 20 years after your  treatment. If Pap tests have been discontinued, your risk factors (such as having a new sexual partner) need to be reassessed to determine if screening should be resumed. Some women have medical problems that increase the chance of getting cervical cancer. In these cases, your health care provider may recommend more frequent screening and Pap tests.    Colorectal cancer can be detected and often prevented. Most routine colorectal cancer screening begins at the age of 50 years and   continues through age 75 years. However, your health care provider may recommend screening at an earlier age if you have risk factors for colon cancer. On a yearly basis, your health care provider may provide home test kits to check for hidden blood in the stool. Use of a small camera at the end of a tube, to directly examine the colon (sigmoidoscopy or colonoscopy), can detect the earliest forms of colorectal cancer. Talk to your health care provider about this at age 50, when routine screening begins. Direct exam of the colon should be repeated every 5-10 years through age 75 years, unless early forms of pre-cancerous polyps or small growths are found.  Osteoporosis is a disease in which the bones lose minerals and strength with aging. This can result in serious bone fractures or breaks. The risk of osteoporosis can be identified using a bone density scan. Women ages 65 years and over and women at risk for fractures or osteoporosis should discuss screening with their health care providers. Ask your health care provider whether you should take a calcium supplement or vitamin D to reduce the rate of osteoporosis.  Menopause can be associated with physical symptoms and risks. Hormone replacement therapy is available to decrease symptoms and risks. You should talk to your health care provider about whether hormone replacement therapy is right for you.  Use sunscreen. Apply sunscreen liberally and repeatedly throughout the day. You  should seek shade when your shadow is shorter than you. Protect yourself by wearing long sleeves, pants, a wide-brimmed hat, and sunglasses year round, whenever you are outdoors.  Once a month, do a whole body skin exam, using a mirror to look at the skin on your back. Tell your health care provider of new moles, moles that have irregular borders, moles that are larger than a pencil eraser, or moles that have changed in shape or color.  Stay current with required vaccines (immunizations).  Influenza vaccine. All adults should be immunized every year.  Tetanus, diphtheria, and acellular pertussis (Td, Tdap) vaccine. Pregnant women should receive 1 dose of Tdap vaccine during each pregnancy. The dose should be obtained regardless of the length of time since the last dose. Immunization is preferred during the 27th-36th week of gestation. An adult who has not previously received Tdap or who does not know her vaccine status should receive 1 dose of Tdap. This initial dose should be followed by tetanus and diphtheria toxoids (Td) booster doses every 10 years. Adults with an unknown or incomplete history of completing a 3-dose immunization series with Td-containing vaccines should begin or complete a primary immunization series including a Tdap dose. Adults should receive a Td booster every 10 years.    Zoster vaccine. One dose is recommended for adults aged 60 years or older unless certain conditions are present.    Pneumococcal 13-valent conjugate (PCV13) vaccine. When indicated, a person who is uncertain of her immunization history and has no record of immunization should receive the PCV13 vaccine. An adult aged 19 years or older who has certain medical conditions and has not been previously immunized should receive 1 dose of PCV13 vaccine. This PCV13 should be followed with a dose of pneumococcal polysaccharide (PPSV23) vaccine. The PPSV23 vaccine dose should be obtained at least 8 weeks after the dose  of PCV13 vaccine. An adult aged 19 years or older who has certain medical conditions and previously received 1 or more doses of PPSV23 vaccine should receive 1 dose of PCV13. The PCV13 vaccine dose should   should be obtained 1 or more years after the last PPSV23 vaccine dose.    Pneumococcal polysaccharide (PPSV23) vaccine. When PCV13 is also indicated, PCV13 should be obtained first. All adults aged 65 years and older should be immunized. An adult younger than age 36 years who has certain medical conditions should be immunized. Any person who resides in a nursing home or long-term care facility should be immunized. An adult smoker should be immunized. People with an immunocompromised condition and certain other conditions should receive both PCV13 and PPSV23 vaccines. People with human immunodeficiency virus (HIV) infection should be immunized as soon as possible after diagnosis. Immunization during chemotherapy or radiation therapy should be avoided. Routine use of PPSV23 vaccine is not recommended for American Indians, [place], or people younger than 65 years unless there are medical conditions that require PPSV23 vaccine. When indicated, people who have unknown immunization and have no record of immunization should receive PPSV23 vaccine. One-time revaccination 5 years after the first dose of PPSV23 is recommended for people aged 19-64 years who have chronic kidney failure, nephrotic syndrome, asplenia, or immunocompromised conditions. People who received 1-2 doses of PPSV23 before age 7 years should receive another dose of PPSV23 vaccine at age 39 years or later if at least 5 years have passed since the previous dose. Doses of PPSV23 are not needed for people immunized with PPSV23 at or after age 20 years.   Preventive Services / Frequency  Ages 13 years and over  Blood pressure check.  Lipid and cholesterol check.  Lung  cancer screening. / Every year if you are aged 71-80 years and have a 30-pack-year history of smoking and currently smoke or have quit within the past 15 years. Yearly screening is stopped once you have quit smoking for at least 15 years or develop a health problem that would prevent you from having lung cancer treatment.  Clinical breast exam.** / Every year after age 62 years.  BRCA-related cancer risk assessment.** / For women who have family members with a BRCA-related cancer (breast, ovarian, tubal, or peritoneal cancers).  Mammogram.** / Every year beginning at age 53 years and continuing for as long as you are in good health. Consult with your health care provider.  Pap test.** / Every 3 years starting at age 26 years through age 57 or 36 years with 3 consecutive normal Pap tests. Testing can be stopped between 65 and 70 years with 3 consecutive normal Pap tests and no abnormal Pap or HPV tests in the past 10 years.  Fecal occult blood test (FOBT) of stool. / Every year beginning at age 8 years and continuing until age 40 years. You may not need to do this test if you get a colonoscopy every 10 years.  Flexible sigmoidoscopy or colonoscopy.** / Every 5 years for a flexible sigmoidoscopy or every 10 years for a colonoscopy beginning at age 78 years and continuing until age 58 years.  Hepatitis C blood test.** / For all people born from [birth year] through 1963 and any individual with known risks for hepatitis C.  Osteoporosis screening.** / A one-time screening for women ages 57 years and over and women at risk for fractures or osteoporosis.  Skin self-exam. / Monthly.  Influenza vaccine. / Every year.  Tetanus, diphtheria, and acellular pertussis (Tdap/Td) vaccine.** / 1 dose of Td every 10 years.  Zoster vaccine.** / 1 dose for adults aged 68 years or older.  Pneumococcal 13-valent conjugate (PCV13) vaccine.** / Consult your health care provider.  Pneumococcal polysaccharide (PPSV23)  vaccine.** / 1 dose for all adults aged 32 years and older. Screening for abdominal aortic aneurysm (AAA)  by ultrasound is recommended for people who have history of high blood pressure or who are current or former smokers.

## 2016-03-26 NOTE — Progress Notes (Signed)
Patient ID: Catherine Wood, female   DOB: 1931-04-24, 82 y.o.   MRN: [id number]  [facility]                   [name], M.D.    Uvaldo Bristle. Silverio Lay, P.A.-C      Starlyn Skeans, P.A.-C   [facility]                [address] [place], [place] [id number] Telephone [phone] Telefax [phone]  ______________________________________________________________________  Annual Screening/Preventative Visit And Comprehensive Evaluation &  Examination     This very nice 82 y.o. [facility] presents for a Wellness/Preventative Visit & comprehensive evaluation and management of multiple medical co-morbidities.  Patient has been followed for HTN, T2_NIDDM, Hyperlipidemia and Vitamin D Deficiency.      HTN predates since 56. Patient's BP has been controlled at home and patient denies any cardiac symptoms as chest pain, palpitations, shortness of breath, dizziness or ankle swelling. Today's BP: 136/80 mmHg      Patient's hyperlipidemia is controlled with diet and medications. Patient denies myalgias or other medication SE's. Last lipids were at goal with  Cholesterol 178; HDL 42; LDL 78; but with elevated Triglycerides 288 on  12/28/2015.     Patient has T2_NIDDM predating circa 2003  and patient denies reactive hypoglycemic symptoms, visual blurring, diabetic polys, or paresthesias. She does report CBG's have been running about 200-300's.  Last A1c was not at goal with A1c 8.5% on 12/28/2015.     Patient has been on thyroid replacement since the 1970's after thyroid surgery with periodic monitoring to assure appropriate dosing. Finally, patient has history of Vitamin D Deficiency  of "26" in 2013 and last Vitamin D was still low at  42 on 12/28/2015.    Medication Sig  . aspirin 81 MG tablet Take 81 mg by mouth daily.  Marland Kitchen CALCIUM 600  Take 600 mg by mouth daily.  Marland Kitchen VITAMIN D 5000 UNITS Take 5,000 Units by mouth daily.   .  fenofibrate 160 MG tablet TAKE 1 TABLET DAILY  . furosemide  40 MG tablet TAKE 1 OR 2 TABLETS DAILY AS NEEDED FOR SWELLING/EDEMA  . gabapentin  300 MG capsule 1 CAPSULE EACH MORNING, 2 EACH AFTERNOON, AND 2 AT BEDTIME  . HUMALOG MIX 75/25 Currently on 30 units bid  . levothyroxine  100 MCG tablet TAKE (1) TABLET DAILY BEFORE BREAKFAST.  Marland Kitchen losartan 100 MG tablet TAKE 1 TABLET ONCE A DAY FOR HIGH BLOOD PRESSURE  . Magnesium 400 MG CAPS Take by mouth 4 (four) times daily.  Marland Kitchen MEGARED OMEGA-3 KRILL   Take 500 mg by mouth 2 (two) times daily.  . metFORMIN  1000 MG TAKE (1) TABLET TWICE A DAY WITH MEALS (BREAKFAST AND SUPPER)  . ICAPS  Take by mouth daily.  . pravastatin  40 MG tablet TAKE 1 TABLET DAILY FOR CHOLESTEROL  . phenergan-codeine 6.25-10 MG/5ML Take 5 mLs by mouth every 6 (six) hours as needed for cough. Max: 50mL per day  . ranitidine  300 MG capsule Take 300 mg by mouth every evening.   Allergies  Allergen Reactions  . Detrol [Tolterodine]    Past Medical History  Diagnosis Date  . Hypertension   . Hypothyroid   . IDDM (insulin dependent diabetes mellitus) ([place])   . Hyperlipidemia   . Macular degeneration   .  Varicose veins   . Leg swelling   . Shortness of breath dyspnea     with exertion  . Sleep apnea     does not wear CPAP  . GERD (gastroesophageal reflux disease)   . HOH (hard of hearing)   . Wears glasses    Health Maintenance  Topic Date Due  . TETANUS/TDAP  09/09/2013  . OPHTHALMOLOGY EXAM  05/10/2015  . FOOT EXAM  02/21/2016  . INFLUENZA VACCINE  04/09/2016  . HEMOGLOBIN A1C  06/28/2016  . DEXA SCAN  Completed  . ZOSTAVAX  Completed  . PNA vac Low Risk Adult  Completed   Immunization History  Administered Date(s) Administered  . DT 10/20/2014  . Influenza-Unspecified 06/22/2013, 06/16/2014, 06/27/2014, 07/06/2015  . Pneumococcal Conjugate-13 04/05/2014  . Pneumococcal-Unspecified 09/09/2004  . Td 09/10/2003  . Zoster 09/09/2005   Past Surgical  History  Procedure Laterality Date  . Biopsy thyroid      benign thyroid nodule removal  . Varicose vein surgery    . Cataract extraction, bilateral    . Endometrial biopsy  2000  . Colonoscopy w/ biopsies and polypectomy    . Tubal ligation    . Anterior cervical decomp/discectomy fusion N/A 11/08/2014    Procedure: Cervical three-four,  Cervical six-seven anterior cervical decompression with fusion plating and bonegraft;  Surgeon: Ashok Pall, MD;  Location: [place] [facility];  Service: Neurosurgery;  Laterality: N/A;  Cervical three-four,  Cervical six-seven anterior cervical decompression with fusion plating and bonegraft  . Spine surgery Left  11/08/2014    Left ACDF    Family History  Problem Relation Age of Onset  . Cancer Mother     colon  . Cancer Father     GI   Social History  Substance Use Topics  . Smoking status: Never Smoker   . Smokeless tobacco: Never Used  . Alcohol Use: No    ROS Constitutional: Denies fever, chills, weight loss/gain, headaches, insomnia,  night sweats, and change in appetite. Does c/o fatigue. Eyes: Denies redness, blurred vision, diplopia, discharge, itchy, watery eyes.  ENT: Denies discharge, congestion, post nasal drip, epistaxis, sore throat, earache, hearing loss, dental pain, Tinnitus, Vertigo, Sinus pain, snoring.  Cardio: Denies chest pain, palpitations, irregular heartbeat, syncope, dyspnea, diaphoresis, orthopnea, PND, claudication, edema Respiratory: denies cough, dyspnea, DOE, pleurisy, hoarseness, laryngitis, wheezing.  Gastrointestinal: Denies dysphagia, heartburn, reflux, water brash, pain, cramps, nausea, vomiting, bloating, diarrhea, constipation, hematemesis, melena, hematochezia, jaundice, hemorrhoids Genitourinary: Denies dysuria, frequency, urgency, nocturia, hesitancy, discharge, hematuria, flank pain Breast: Breast lumps, nipple discharge, bleeding.  Musculoskeletal: Denies arthralgia, myalgia, stiffness, Jt. Swelling, pain,  limp, and strain/sprain. Denies falls. Skin: Denies puritis, rash, hives, warts, acne, eczema, changing in skin lesion Neuro: No weakness, tremor, incoordination, spasms, paresthesia, pain Psychiatric: Denies confusion, memory loss, sensory loss. Denies Depression. Endocrine: Denies change in weight, skin, hair change, nocturia, and paresthesia, diabetic polys, visual blurring, hyper / hypo glycemic episodes.  Heme/Lymph: No excessive bleeding, bruising, enlarged lymph nodes.  Physical Exam  BP 136/80   Pulse 88  Temp97.2 F   Resp 16  Ht 5\' 5"    Wt 195 lb 9.6 oz     BMI 32.55   General Appearance: Well nourished and in no apparent distress. Eyes: PERRLA, EOMs, conjunctiva no swelling or erythema, normal fundi and vessels. Sinuses: No frontal/maxillary tenderness ENT/Mouth: EACs patent / TMs  nl. Nares clear without erythema, swelling, mucoid exudates. Oral hygiene is good. No erythema, swelling, or exudate. Tongue normal, non-obstructing. Tonsils not  swollen or erythematous. Hearing normal.  Neck: Supple, thyroid normal. No bruits, nodes or JVD. Respiratory: Respiratory effort normal.  BS equal and clear bilateral without rales, rhonci, wheezing or stridor. Cardio: Heart sounds are normal with regular rate and rhythm and no murmurs, rubs or gallops. Peripheral pulses are normal and equal bilaterally without edema. No aortic or femoral bruits. Chest: symmetric with normal excursions and percussion. Breasts: Symmetric, without lumps, nipple discharge, retractions, or fibrocystic changes.  Abdomen: Flat, soft with bowel sounds active. Nontender, no guarding, rebound, hernias, masses, or organomegaly.  Lymphatics: Non tender without lymphadenopathy.  Genitourinary:  Musculoskeletal: Full ROM all peripheral extremities, joint stability, 5/5 strength, and normal gait. Skin: Warm and dry without rashes, lesions, cyanosis, clubbing or  ecchymosis.  Neuro: Cranial nerves intact, reflexes equal  bilaterally. Normal muscle tone, no cerebellar symptoms. Sensation intact to touch, Vibratory & Monofilament testing to the toes bilaterally. Marland Kitchen  Pysch: Alert and oriented X 3, normal affect, Insight and Judgment appropriate.   Assessment and Plan  1. Annual Preventative Screening Examination  - Microalbumin / creatinine urine ratio - EKG 12-Lead - [place], RETROPERITNL ABD,  LTD - POC Hemoccult Bld/Stl  - Urinalysis, Routine w reflex microscopic - HM DIABETES FOOT EXAM - LOW EXTREMITY NEUR EXAM DOCUM - CBC with Differential/Platelet - BASIC METABOLIC PANEL WITH GFR - Hepatic function panel - Magnesium - Lipid panel - TSH - Hemoglobin A1c - Insulin, random - VITAMIN D 25 Hydroxy  2. Essential hypertension  - Continue medication, monitor blood pressure at home. Continue DASH diet. Reminder to go to the ER if any CP, SOB, nausea, dizziness, severe HA, changes vision/speech, left arm numbness and tingling and jaw pain.  - EKG 12-Lead - [place], RETROPERITNL ABD,  LTD - TSH  3.  Hyperlipidemia   - Continue diet/meds, exercise,& lifestyle modifications. Continue monitor periodic cholesterol/liver & renal function - Lipid panel - TSH  4. CKD stage 3 due to type 2 diabetes mellitus (HCC)  - Continue diet, exercise, lifestyle modifications. Monitor appropriate labs. - Microalbumin / creatinine urine ratio - HM DIABETES FOOT EXAM - LOW EXTREMITY NEUR EXAM DOCUM - Hemoglobin A1c - Insulin, random  5. Vitamin D deficiency  - Continue supplementation. - VITAMIN D 25 Hydroxy   6. CKD (chronic kidney disease) stage 3, GFR 30-59 ml/min   7. Hypothyroidism  - TSH  8. Screening for rectal cancer  - POC Hemoccult Bld/Stl  9. Medication management  - Urinalysis, Routine w reflex microscopic - CBC with Differential/Platelet - BASIC METABOLIC PANEL WITH GFR - Hepatic function panel - Magnesium  10. Screening for ischemic heart disease   11. Screening for AAA (aortic  abdominal aneurysm)   Continue prudent diet as discussed, weight control, BP monitoring, regular exercise, and medications. Discussed med's effects and SE's. Screening labs and tests as requested with regular follow-up as recommended. Over 40 minutes of exam, counseling, chart review and high complex critical decision making was performed.

## 2016-03-27 ENCOUNTER — Other Ambulatory Visit: Payer: Self-pay | Admitting: Internal Medicine

## 2016-03-27 LAB — URINALYSIS, ROUTINE W REFLEX MICROSCOPIC
BILIRUBIN URINE: NEGATIVE
HGB URINE DIPSTICK: NEGATIVE
Ketones, ur: NEGATIVE
Nitrite: NEGATIVE
PH: 6.5 (ref 5.0–8.0)
PROTEIN: NEGATIVE
Specific Gravity, Urine: 1.023 (ref 1.001–1.035)

## 2016-03-27 LAB — HEMOGLOBIN A1C
HEMOGLOBIN A1C: 9.7 % — AB (ref <5.7)
MEAN PLASMA GLUCOSE: 232 mg/dL

## 2016-03-27 LAB — URINALYSIS, MICROSCOPIC ONLY
BACTERIA UA: NONE SEEN [HPF]
CASTS: NONE SEEN [LPF]
Crystals: NONE SEEN [HPF]
RBC / HPF: NONE SEEN RBC/HPF (ref <=2)
YEAST: NONE SEEN [HPF]

## 2016-03-27 LAB — MICROALBUMIN / CREATININE URINE RATIO
Creatinine, Urine: 140 mg/dL (ref 20–320)
MICROALB UR: 5.8 mg/dL
MICROALB/CREAT RATIO: 41 ug/mg{creat} — AB (ref <30)

## 2016-03-27 LAB — VITAMIN D 25 HYDROXY (VIT D DEFICIENCY, FRACTURES): VIT D 25 HYDROXY: 46 ng/mL (ref 30–100)

## 2016-03-27 LAB — INSULIN, RANDOM: Insulin: 78.5 u[IU]/mL — ABNORMAL HIGH (ref 2.0–19.6)

## 2016-04-02 ENCOUNTER — Other Ambulatory Visit: Payer: Self-pay | Admitting: Internal Medicine

## 2016-04-04 DIAGNOSIS — H353221 Exudative age-related macular degeneration, left eye, with active choroidal neovascularization: Secondary | ICD-10-CM | POA: Diagnosis not present

## 2016-04-09 ENCOUNTER — Other Ambulatory Visit: Payer: Self-pay | Admitting: Internal Medicine

## 2016-04-24 ENCOUNTER — Other Ambulatory Visit: Payer: Self-pay | Admitting: *Deleted

## 2016-04-24 ENCOUNTER — Other Ambulatory Visit: Payer: Self-pay | Admitting: Internal Medicine

## 2016-04-24 DIAGNOSIS — Z0001 Encounter for general adult medical examination with abnormal findings: Secondary | ICD-10-CM

## 2016-04-24 DIAGNOSIS — Z1212 Encounter for screening for malignant neoplasm of rectum: Secondary | ICD-10-CM

## 2016-04-24 LAB — POC HEMOCCULT BLD/STL (HOME/3-CARD/SCREEN)
Card #2 Fecal Occult Blod, POC: NEGATIVE
FECAL OCCULT BLD: NEGATIVE
FECAL OCCULT BLD: NEGATIVE

## 2016-05-02 DIAGNOSIS — B351 Tinea unguium: Secondary | ICD-10-CM | POA: Diagnosis not present

## 2016-05-02 DIAGNOSIS — L84 Corns and callosities: Secondary | ICD-10-CM | POA: Diagnosis not present

## 2016-05-02 DIAGNOSIS — E1142 Type 2 diabetes mellitus with diabetic polyneuropathy: Secondary | ICD-10-CM | POA: Diagnosis not present

## 2016-05-07 DIAGNOSIS — M5136 Other intervertebral disc degeneration, lumbar region: Secondary | ICD-10-CM | POA: Diagnosis not present

## 2016-05-08 ENCOUNTER — Other Ambulatory Visit: Payer: Self-pay | Admitting: Internal Medicine

## 2016-05-09 DIAGNOSIS — H353221 Exudative age-related macular degeneration, left eye, with active choroidal neovascularization: Secondary | ICD-10-CM | POA: Diagnosis not present

## 2016-05-09 DIAGNOSIS — H35341 Macular cyst, hole, or pseudohole, right eye: Secondary | ICD-10-CM | POA: Diagnosis not present

## 2016-05-09 DIAGNOSIS — H353134 Nonexudative age-related macular degeneration, bilateral, advanced atrophic with subfoveal involvement: Secondary | ICD-10-CM | POA: Diagnosis not present

## 2016-05-09 DIAGNOSIS — H3570 Unspecified separation of retinal layers: Secondary | ICD-10-CM | POA: Diagnosis not present

## 2016-05-09 DIAGNOSIS — H353211 Exudative age-related macular degeneration, right eye, with active choroidal neovascularization: Secondary | ICD-10-CM | POA: Diagnosis not present

## 2016-05-15 ENCOUNTER — Other Ambulatory Visit: Payer: Self-pay | Admitting: Internal Medicine

## 2016-05-28 ENCOUNTER — Other Ambulatory Visit: Payer: Self-pay | Admitting: Internal Medicine

## 2016-05-29 ENCOUNTER — Other Ambulatory Visit: Payer: Self-pay | Admitting: Internal Medicine

## 2016-06-05 ENCOUNTER — Other Ambulatory Visit: Payer: Self-pay | Admitting: Internal Medicine

## 2016-06-20 DIAGNOSIS — H353221 Exudative age-related macular degeneration, left eye, with active choroidal neovascularization: Secondary | ICD-10-CM | POA: Diagnosis not present

## 2016-06-26 ENCOUNTER — Other Ambulatory Visit: Payer: Self-pay | Admitting: Internal Medicine

## 2016-07-01 ENCOUNTER — Encounter: Payer: Self-pay | Admitting: Internal Medicine

## 2016-07-01 ENCOUNTER — Ambulatory Visit (INDEPENDENT_AMBULATORY_CARE_PROVIDER_SITE_OTHER): Payer: Medicare Other | Admitting: Internal Medicine

## 2016-07-01 VITALS — BP 128/70 | HR 74 | Temp 98.2°F | Resp 16 | Ht 65.0 in | Wt 200.0 lb

## 2016-07-01 DIAGNOSIS — M25561 Pain in right knee: Secondary | ICD-10-CM | POA: Diagnosis not present

## 2016-07-01 DIAGNOSIS — E1142 Type 2 diabetes mellitus with diabetic polyneuropathy: Secondary | ICD-10-CM

## 2016-07-01 DIAGNOSIS — N183 Chronic kidney disease, stage 3 unspecified: Secondary | ICD-10-CM

## 2016-07-01 DIAGNOSIS — E559 Vitamin D deficiency, unspecified: Secondary | ICD-10-CM

## 2016-07-01 DIAGNOSIS — Z23 Encounter for immunization: Secondary | ICD-10-CM | POA: Diagnosis not present

## 2016-07-01 DIAGNOSIS — I10 Essential (primary) hypertension: Secondary | ICD-10-CM

## 2016-07-01 DIAGNOSIS — G8929 Other chronic pain: Secondary | ICD-10-CM | POA: Diagnosis not present

## 2016-07-01 DIAGNOSIS — E1122 Type 2 diabetes mellitus with diabetic chronic kidney disease: Secondary | ICD-10-CM

## 2016-07-01 DIAGNOSIS — E782 Mixed hyperlipidemia: Secondary | ICD-10-CM

## 2016-07-01 DIAGNOSIS — E039 Hypothyroidism, unspecified: Secondary | ICD-10-CM | POA: Diagnosis not present

## 2016-07-01 DIAGNOSIS — M25562 Pain in left knee: Secondary | ICD-10-CM

## 2016-07-01 DIAGNOSIS — Z79899 Other long term (current) drug therapy: Secondary | ICD-10-CM

## 2016-07-01 LAB — CBC WITH DIFFERENTIAL/PLATELET
BASOS PCT: 0 %
Basophils Absolute: 0 cells/uL (ref 0–200)
EOS ABS: 444 {cells}/uL (ref 15–500)
Eosinophils Relative: 6 %
HEMATOCRIT: 37.3 % (ref 35.0–45.0)
Hemoglobin: 11.8 g/dL (ref 11.7–15.5)
LYMPHS ABS: 1776 {cells}/uL (ref 850–3900)
Lymphocytes Relative: 24 %
MCH: 27.4 pg (ref 27.0–33.0)
MCHC: 31.6 g/dL — ABNORMAL LOW (ref 32.0–36.0)
MCV: 86.7 fL (ref 80.0–100.0)
MONO ABS: 740 {cells}/uL (ref 200–950)
MONOS PCT: 10 %
MPV: 10.7 fL (ref 7.5–12.5)
NEUTROS ABS: 4440 {cells}/uL (ref 1500–7800)
Neutrophils Relative %: 60 %
Platelets: 341 10*3/uL (ref 140–400)
RBC: 4.3 MIL/uL (ref 3.80–5.10)
RDW: 15.2 % — ABNORMAL HIGH (ref 11.0–15.0)
WBC: 7.4 10*3/uL (ref 3.8–10.8)

## 2016-07-01 LAB — TSH: TSH: 1.21 m[IU]/L

## 2016-07-01 MED ORDER — DICLOFENAC SODIUM 1 % TD GEL: 4.0000 g | Freq: Four times a day (QID) | TRANSDERMAL | 3 refills | Status: DC

## 2016-07-01 NOTE — Progress Notes (Signed)
Assessment and Plan:  Hypertension:  -Continue medication -monitor blood pressure at home. -Continue DASH diet -Reminder to go to the ER if any CP, SOB, nausea, dizziness, severe HA, changes vision/speech, left arm numbness and tingling and jaw pain.  Cholesterol - Continue diet and exercise -Check cholesterol.   Diabetes with diabetic chronic kidney disease  -discussed diet compliance and switching to maybe almond mild instead of whole milk and cutting out sodas.   -Continue diet and exercise.  -Check A1C  Vitamin D Def -check level -continue medications.   Hypothyroidism -TSH -cont levothyroxine  Bilateral knee pain -cont trying to walk as much as possible -voltaren vs. pennsaid gel -If no relief will likely need injections at the ortho office  Morbid obesity -cut out sodas and also whole milk -try almond milk instead  Continue diet and meds as discussed. Further disposition pending results of labs. Discussed med's effects and SE's.    HPI 82 y.o. female  presents for 3 month follow up with hypertension, hyperlipidemia, diabetes and vitamin D deficiency.   Her blood pressure has been controlled at home, today their BP is BP: 128/70.She does not workout. She denies chest pain, shortness of breath, dizziness.   She is on cholesterol medication and denies myalgias. Her cholesterol is at goal. The cholesterol was:  03/26/2016: Cholesterol 204; HDL 40; LDL Cholesterol 106; Triglycerides 289   She has been working on diet and exercise for diabetes with diabetic chronic kidney disease, she is on bASA, she is on ACE/ARB, and denies  foot ulcerations, hyperglycemia, hypoglycemia , increased appetite, nausea, paresthesia of the feet, polydipsia, polyuria, visual disturbances, vomiting and weight loss. Last A1C was: 03/26/2016: Hgb A1c MFr Bld 9.7.  She reports that her blood sugars are doing about the same.  She reports that she is somewhere around 200 all the time.  She reports that  she checks it every morning.  She has not had any recent hypoglycemia.  She reports that she is working on it.  She is doing metformin twice daily and now she is on the 75/25 humalog and is doing 40 units twice daily.     Patient is on Vitamin D supplement. 03/26/2016: Vit D, 25-Hydroxy 46  Patient reports that she is having trouble with her knees bilaterally.  She reports that it has been going on for the last couple weeks.  She sees Dr. Mayer Camel at [place].  She reports that she can go to see him.  She has been taking thyroid medication on an empty stomach and waits.  She is good about waiting up to an hour prior to taking her medications.    Current Medications:  Current Outpatient Prescriptions on File Prior to Visit  Medication Sig Dispense Refill  . aspirin 81 MG tablet Take 81 mg by mouth daily.    . Calcium Carbonate (CALCIUM 600 PO) Take 600 mg by mouth daily.    . Cholecalciferol (VITAMIN D3) 5000 UNITS CAPS Take 5,000 Units by mouth daily.     . fenofibrate 160 MG tablet TAKE 1 TABLET DAILY 30 tablet PRN  . furosemide (LASIX) 40 MG tablet TAKE 1 OR 2 TABLETS DAILY AS NEEDED FOR SWELLING/EDEMA 60 tablet 1  . gabapentin (NEURONTIN) 300 MG capsule 1 CAPSULE EACH MORNING, 2 EACH AFTERNOON, AND 2 AT BEDTIME 150 capsule 0  . glucose blood (ACCU-CHEK AVIVA PLUS) test strip Check blood sugar 3 times daily-DX-E11.22. 100 each 6  . HUMALOG MIX 75/25 KWIKPEN (75-25) 100 UNIT/ML Kwikpen     .  HUMALOG MIX 75/25 KWIKPEN (75-25) 100 UNIT/ML Kwikpen 35 UNITS EVERY MORNING AND 15 TO 20 UNITS EVERY EVENING AS DIRECTED 15 mL 1  . HUMALOG MIX 75/25 KWIKPEN (75-25) 100 UNIT/ML Kwikpen 35 UNITS EVERY MORNING AND 15 TO 20 UNITS EVERY EVENING AS DIRECTED 15 mL 1  . HYDROcodone-acetaminophen (NORCO/VICODIN) 5-325 MG tablet Take 1 tablet by mouth 3 (three) times daily. PRN    . insulin lispro protamine-lispro (HUMALOG MIX 75/25) (75-25) 100 UNIT/ML SUSP injection Use 35-50 units bid as directed (Patient  taking differently: Use 23-18 units bid as directed) 15 mL 11  . Insulin Pen Needle (PEN NEEDLES 29GX1/2") 29G X 12MM MISC USE 4 TIMES A DAY 100 each 0  . Lancets (ACCU-CHEK SOFT TOUCH) lancets Check blood sugar 3 times daily-DX-E11.22. 100 each 6  . levothyroxine (SYNTHROID, LEVOTHROID) 100 MCG tablet TAKE (1) TABLET DAILY BE- FORE BREAKFAST. 90 tablet 1  . losartan (COZAAR) 100 MG tablet TAKE 1 TABLET ONCE A DAY FOR HIGH BLOOD PRESSURE 90 tablet 0  . Magnesium 400 MG CAPS Take by mouth 4 (four) times daily.    Marland Kitchen MEGARED OMEGA-3 KRILL OIL 500 MG CAPS Take 500 mg by mouth 2 (two) times daily.    . metFORMIN (GLUCOPHAGE) 1000 MG tablet TAKE (1) TABLET TWICE A DAY WITH MEALS (BREAKFAST AND SUPPER) 180 tablet 1  . Multiple Vitamins-Minerals (ICAPS PO) Take by mouth daily.    Marland Kitchen OVER THE COUNTER MEDICATION as needed. Macular Degeneration refresh eye drops    . pravastatin (PRAVACHOL) 40 MG tablet TAKE 1 TABLET DAILY FOR CHOLESTEROL 90 tablet 0  . ranitidine (ZANTAC) 300 MG capsule Take 300 mg by mouth every evening.    Marland Kitchen ULTICARE SHORT PEN NEEDLES 31G X 8 MM MISC USE 4 TIMES A DAY 100 each 0   No current facility-administered medications on file prior to visit.    Medical History:  Past Medical History:  Diagnosis Date  . GERD (gastroesophageal reflux disease)   . HOH (hard of hearing)   . Hyperlipidemia   . Hypertension   . Hypothyroid   . IDDM (insulin dependent diabetes mellitus) ([place])   . Leg swelling   . Macular degeneration   . Shortness of breath dyspnea    with exertion  . Sleep apnea    does not wear CPAP  . Varicose veins   . Wears glasses    Allergies:  Allergies  Allergen Reactions  . Detrol [Tolterodine]      Review of Systems:  Review of Systems  Constitutional: Negative for chills, fever and malaise/fatigue.  HENT: Negative for congestion, ear pain and sore throat.   Eyes: Negative.   Respiratory: Negative for cough, shortness of breath and wheezing.    Cardiovascular: Negative for chest pain, palpitations and leg swelling.  Gastrointestinal: Negative for abdominal pain, blood in stool, constipation, diarrhea, heartburn and melena.  Genitourinary: Negative.   Skin: Negative.   Neurological: Negative for dizziness, sensory change, loss of consciousness and headaches.  Psychiatric/Behavioral: Negative for depression. The patient is not nervous/anxious and does not have insomnia.     Family history- Review and unchanged  Social history- Review and unchanged  Physical Exam: BP 128/70   Pulse 74   Temp 98.2 F (36.8 C) (Temporal)   Resp 16   Ht 5\' 5"  (1.651 m)   Wt 200 lb (90.7 kg)   BMI 33.28 kg/m  Wt Readings from Last 3 Encounters:  07/01/16 200 lb (90.7 kg)  03/26/16 195 lb 9.6  oz (88.7 kg)  01/30/16 194 lb 6.4 oz (88.2 kg)   General Appearance: Well nourished well developed, non-toxic appearing, in no apparent distress. Eyes: PERRLA, EOMs, conjunctiva no swelling or erythema ENT/Mouth: Ear canals clear with no erythema, swelling, or discharge.  TMs normal bilaterally, oropharynx clear, moist, with no exudate.   Neck: Supple, thyroid normal, no JVD, no cervical adenopathy.  Respiratory: Respiratory effort normal, breath sounds clear A&P, no wheeze, rhonchi or rales noted.  No retractions, no accessory muscle usage Cardio: RRR with no MRGs. No noted edema.  Abdomen: Soft, + BS.  Non tender, no guarding, rebound, hernias, masses. Musculoskeletal: Full ROM, 5/5 strength, Normal gait Skin: Warm, dry without rashes, lesions, ecchymosis.  Neuro: Awake and oriented X 3, Cranial nerves intact. No cerebellar symptoms.  Psych: normal affect, Insight and Judgment appropriate.    Starlyn Skeans, PA-C 10:42 AM [facility]

## 2016-07-01 NOTE — Patient Instructions (Addendum)
Please use the pensaid or voltaren gel packets.  Use 1/2 a packet per knee twice daily to help with knee pain.  If this doesn't work call [employer] and inquire about an appointment with them since you are already established.  Please try drinking almond milk instead of drinking whole milk.

## 2016-07-02 LAB — LIPID PANEL
CHOLESTEROL: 169 mg/dL (ref 125–200)
HDL: 47 mg/dL (ref >=46)
LDL Cholesterol: 72 mg/dL (ref <130)
TRIGLYCERIDES: 250 mg/dL — AB (ref <150)
Total CHOL/HDL Ratio: 3.6 Ratio (ref <=5.0)
VLDL: 50 mg/dL — ABNORMAL HIGH (ref <30)

## 2016-07-02 LAB — BASIC METABOLIC PANEL WITH GFR
BUN: 21 mg/dL (ref 7–25)
CALCIUM: 9 mg/dL (ref 8.6–10.4)
CO2: 30 mmol/L (ref 20–31)
CREATININE: 0.87 mg/dL (ref 0.60–0.88)
Chloride: 99 mmol/L (ref 98–110)
GFR, EST AFRICAN AMERICAN: 71 mL/min (ref >=60)
GFR, EST NON AFRICAN AMERICAN: 62 mL/min (ref >=60)
GLUCOSE: 211 mg/dL — AB (ref 65–99)
Potassium: 4.7 mmol/L (ref 3.5–5.3)
SODIUM: 138 mmol/L (ref 135–146)

## 2016-07-02 LAB — HEPATIC FUNCTION PANEL
ALT: 19 U/L (ref 6–29)
AST: 21 U/L (ref 10–35)
Albumin: 4 g/dL (ref 3.6–5.1)
Alkaline Phosphatase: 39 U/L (ref 33–130)
BILIRUBIN DIRECT: 0.1 mg/dL (ref <=0.2)
BILIRUBIN INDIRECT: 0.2 mg/dL (ref 0.2–1.2)
TOTAL PROTEIN: 6.3 g/dL (ref 6.1–8.1)
Total Bilirubin: 0.3 mg/dL (ref 0.2–1.2)

## 2016-07-02 LAB — HEMOGLOBIN A1C
Hgb A1c MFr Bld: 8.8 % — ABNORMAL HIGH (ref <5.7)
Mean Plasma Glucose: 206 mg/dL

## 2016-07-11 DIAGNOSIS — B351 Tinea unguium: Secondary | ICD-10-CM | POA: Diagnosis not present

## 2016-07-11 DIAGNOSIS — E1142 Type 2 diabetes mellitus with diabetic polyneuropathy: Secondary | ICD-10-CM | POA: Diagnosis not present

## 2016-07-11 DIAGNOSIS — L84 Corns and callosities: Secondary | ICD-10-CM | POA: Diagnosis not present

## 2016-07-15 ENCOUNTER — Other Ambulatory Visit: Payer: Self-pay | Admitting: Internal Medicine

## 2016-07-17 ENCOUNTER — Other Ambulatory Visit: Payer: Self-pay | Admitting: Internal Medicine

## 2016-07-19 ENCOUNTER — Other Ambulatory Visit: Payer: Self-pay | Admitting: Physician Assistant

## 2016-08-05 DIAGNOSIS — H353221 Exudative age-related macular degeneration, left eye, with active choroidal neovascularization: Secondary | ICD-10-CM | POA: Diagnosis not present

## 2016-08-06 ENCOUNTER — Other Ambulatory Visit: Payer: Self-pay | Admitting: Physician Assistant

## 2016-08-12 ENCOUNTER — Other Ambulatory Visit: Payer: Self-pay | Admitting: Internal Medicine

## 2016-08-20 ENCOUNTER — Other Ambulatory Visit: Payer: Self-pay | Admitting: Internal Medicine

## 2016-09-05 ENCOUNTER — Emergency Department (HOSPITAL_COMMUNITY)
Admission: EM | Admit: 2016-09-05 | Discharge: 2016-09-05 | Disposition: A | Discharge status: Home / Self Care | Payer: Medicare Other | Attending: Emergency Medicine | Admitting: Emergency Medicine

## 2016-09-05 ENCOUNTER — Emergency Department (HOSPITAL_COMMUNITY): Payer: Medicare Other

## 2016-09-05 ENCOUNTER — Encounter (HOSPITAL_COMMUNITY): Payer: Self-pay

## 2016-09-05 DIAGNOSIS — I129 Hypertensive chronic kidney disease with stage 1 through stage 4 chronic kidney disease, or unspecified chronic kidney disease: Secondary | ICD-10-CM | POA: Insufficient documentation

## 2016-09-05 DIAGNOSIS — E1122 Type 2 diabetes mellitus with diabetic chronic kidney disease: Secondary | ICD-10-CM | POA: Diagnosis not present

## 2016-09-05 DIAGNOSIS — E039 Hypothyroidism, unspecified: Secondary | ICD-10-CM | POA: Diagnosis not present

## 2016-09-05 DIAGNOSIS — Z7982 Long term (current) use of aspirin: Secondary | ICD-10-CM | POA: Insufficient documentation

## 2016-09-05 DIAGNOSIS — E1140 Type 2 diabetes mellitus with diabetic neuropathy, unspecified: Secondary | ICD-10-CM | POA: Insufficient documentation

## 2016-09-05 DIAGNOSIS — M25551 Pain in right hip: Secondary | ICD-10-CM | POA: Diagnosis not present

## 2016-09-05 DIAGNOSIS — N183 Chronic kidney disease, stage 3 (moderate): Secondary | ICD-10-CM | POA: Insufficient documentation

## 2016-09-05 DIAGNOSIS — M519 Unspecified thoracic, thoracolumbar and lumbosacral intervertebral disc disorder: Secondary | ICD-10-CM

## 2016-09-05 DIAGNOSIS — Z794 Long term (current) use of insulin: Secondary | ICD-10-CM | POA: Insufficient documentation

## 2016-09-05 DIAGNOSIS — M545 Low back pain: Secondary | ICD-10-CM | POA: Diagnosis not present

## 2016-09-05 DIAGNOSIS — M5136 Other intervertebral disc degeneration, lumbar region: Secondary | ICD-10-CM | POA: Insufficient documentation

## 2016-09-05 HISTORY — DX: Unspecified osteoarthritis, unspecified site: M19.90

## 2016-09-05 LAB — CBC WITH DIFFERENTIAL/PLATELET
BASOS ABS: 0 10*3/uL (ref 0.0–0.1)
BASOS PCT: 0 %
EOS ABS: 0.3 10*3/uL (ref 0.0–0.7)
Eosinophils Relative: 3 %
HEMATOCRIT: 39 % (ref 36.0–46.0)
HEMOGLOBIN: 13 g/dL (ref 12.0–15.0)
Lymphocytes Relative: 23 %
Lymphs Abs: 2.2 10*3/uL (ref 0.7–4.0)
MCH: 28.7 pg (ref 26.0–34.0)
MCHC: 33.3 g/dL (ref 30.0–36.0)
MCV: 86.1 fL (ref 78.0–100.0)
MONO ABS: 0.8 10*3/uL (ref 0.1–1.0)
Monocytes Relative: 8 %
NEUTROS ABS: 6.4 10*3/uL (ref 1.7–7.7)
NEUTROS PCT: 66 %
Platelets: 315 10*3/uL (ref 150–400)
RBC: 4.53 MIL/uL (ref 3.87–5.11)
RDW: 13.8 % (ref 11.5–15.5)
WBC: 9.8 10*3/uL (ref 4.0–10.5)

## 2016-09-05 LAB — URINALYSIS, ROUTINE W REFLEX MICROSCOPIC
BILIRUBIN URINE: NEGATIVE
GLUCOSE, UA: NEGATIVE mg/dL
HGB URINE DIPSTICK: NEGATIVE
Ketones, ur: NEGATIVE mg/dL
LEUKOCYTES UA: NEGATIVE
NITRITE: NEGATIVE
PH: 7 (ref 5.0–8.0)
Protein, ur: 30 mg/dL — AB
SPECIFIC GRAVITY, URINE: 1.014 (ref 1.005–1.030)
Squamous Epithelial / LPF: NONE SEEN

## 2016-09-05 LAB — CBG MONITORING, ED: Glucose-Capillary: 168 mg/dL — ABNORMAL HIGH (ref 65–99)

## 2016-09-05 LAB — BASIC METABOLIC PANEL
ANION GAP: 10 (ref 5–15)
BUN: 23 mg/dL — ABNORMAL HIGH (ref 6–20)
CALCIUM: 9.5 mg/dL (ref 8.9–10.3)
CO2: 27 mmol/L (ref 22–32)
Chloride: 100 mmol/L — ABNORMAL LOW (ref 101–111)
Creatinine, Ser: 0.87 mg/dL (ref 0.44–1.00)
GFR, EST NON AFRICAN AMERICAN: 60 mL/min — AB (ref >60)
Glucose, Bld: 151 mg/dL — ABNORMAL HIGH (ref 65–99)
Potassium: 4.4 mmol/L (ref 3.5–5.1)
SODIUM: 137 mmol/L (ref 135–145)

## 2016-09-05 MED ORDER — MORPHINE SULFATE (PF) 4 MG/ML IV SOLN
4.0000 mg | Freq: Once | INTRAVENOUS | Status: AC
  Administered 2016-09-05: 4 mg via INTRAVENOUS
  Filled 2016-09-05: qty 1

## 2016-09-05 MED ORDER — CYCLOBENZAPRINE HCL 10 MG PO TABS: 10.0000 mg | ORAL_TABLET | Freq: Two times a day (BID) | ORAL | 0 refills | Status: DC | PRN

## 2016-09-05 MED ORDER — CYCLOBENZAPRINE HCL 10 MG PO TABS
10.0000 mg | ORAL_TABLET | Freq: Once | ORAL | Status: AC
  Administered 2016-09-05: 10 mg via ORAL
  Filled 2016-09-05: qty 1

## 2016-09-05 MED ORDER — PREDNISONE 10 MG PO TABS: ORAL_TABLET | ORAL | 0 refills | Status: DC

## 2016-09-05 NOTE — ED Notes (Signed)
Pt verbalized understanding discharge instructions and denies any further needs or questions at this time. VS stable, ambulatory and steady gait.   

## 2016-09-05 NOTE — ED Notes (Signed)
Pt on bedpan right now. Was able to roll onto it and sit up with minimal distress. More uncomfortable to lie down flat.

## 2016-09-05 NOTE — ED Notes (Signed)
[phone] Tommy-husband [phone] Haskell Riling- daughter

## 2016-09-05 NOTE — ED Notes (Addendum)
Pt states that this pain is similar to usual pain she feels but worse. States she actually has pain in both hips. States radiating into groin and has eased up as she repositioned but bearing weight on it is "agonizing".

## 2016-09-05 NOTE — ED Triage Notes (Signed)
Pt reports right hip pain that has worsened X2 days. Hx of arthritis. Pt denies falling or any other injury but pt reports it is very difficult to bear weight on the right leg. Cane at bedside.

## 2016-09-05 NOTE — ED Notes (Signed)
Patient transported to MRI 

## 2016-09-05 NOTE — ED Provider Notes (Signed)
[facility] DEPT Provider Note   CSN: [id number] Arrival date & time: 09/05/16  [id number]     History   Chief Complaint Chief Complaint  Patient presents with  . Hip Pain    HPI Catherine Wood is a 82 y.o. female.  HPI Catherine Wood is a 82 y.o. female with PMH significant for DM with neuropathy, arthritis, lumbar disc disease, HTN, HLD, and CKD who presents with 2-3 days of gradual onset, constant, severe right hip pain that radiates to her groin and right knee with associated weakness due to pain.  She describes it as aching.  She has been taking Norco 5-325 mg TID without relief.  She is followed by Dr. Nelva Bush with [facility] for lumbar disc disease for which she receives epidural injections, next one is due in early January.  She normally ambulates with a cane, but these last couple of days she has not been able to bear any weight on her leg and has required assistance for any ambulation.  She denies fever, chills, numbness, abdominal pain, N/V/D, urinary symptoms, or new back pain.    Past Medical History:  Diagnosis Date  . Arthritis   . GERD (gastroesophageal reflux disease)   . HOH (hard of hearing)   . Hyperlipidemia   . Hypertension   . Hypothyroid   . IDDM (insulin dependent diabetes mellitus) ([place])   . Leg swelling   . Macular degeneration   . Shortness of breath dyspnea    with exertion  . Sleep apnea    does not wear CPAP  . Varicose veins   . Wears glasses     Patient Active Problem List   Diagnosis Date Noted  . Diabetes, polyneuropathy ([place]) 12/15/2015  . Cervical spondylosis with myelopathy 11/08/2014  . Obesity 10/20/2014  . DDD (degenerative disc disease), lumbar 10/20/2014  . CKD (chronic kidney disease) stage 3, GFR 30-59 ml/min 10/20/2014  . Chronic venous insufficiency 12/31/2013  . Vitamin D deficiency 12/30/2013  . Medication management 12/30/2013  . Hypertension   . Hypothyroid   . CKD stage 3 due to type 2 diabetes mellitus ([place])     . Hyperlipidemia   . Macular degeneration     Past Surgical History:  Procedure Laterality Date  . ANTERIOR CERVICAL DECOMP/DISCECTOMY FUSION N/A 11/08/2014   Procedure: Cervical three-four,  Cervical six-seven anterior cervical decompression with fusion plating and bonegraft;  Surgeon: Ashok Pall, MD;  Location: [facility];  Service: Neurosurgery;  Laterality: N/A;  Cervical three-four,  Cervical six-seven anterior cervical decompression with fusion plating and bonegraft  . BIOPSY THYROID     benign thyroid nodule removal  . CATARACT EXTRACTION, BILATERAL    . COLONOSCOPY W/ BIOPSIES AND POLYPECTOMY    . ENDOMETRIAL BIOPSY  2000  . SPINE SURGERY Left  11/08/2014   Left ACDF   . TUBAL LIGATION    . VARICOSE VEIN SURGERY      OB History    No data available       Home Medications    Prior to Admission medications   Medication Sig Start Date End Date Taking? Authorizing Provider  aspirin 81 MG tablet Take 81 mg by mouth daily.    Historical Provider, MD  Calcium Carbonate (CALCIUM 600 PO) Take 600 mg by mouth daily.    Historical Provider, MD  Cholecalciferol (VITAMIN D3) 5000 UNITS CAPS Take 5,000 Units by mouth daily.     Historical Provider, MD  cyclobenzaprine (FLEXERIL) 10 MG tablet Take 1  tablet (10 mg total) by mouth 2 (two) times daily as needed for muscle spasms. 09/05/16   Gloriann Loan, PA-C  diclofenac sodium (VOLTAREN) 1 % GEL Apply 4 g topically 4 (four) times daily. 07/01/16   Courtney Forcucci, PA-C  fenofibrate 160 MG tablet TAKE 1 TABLET DAILY 08/20/16   Vicie Mutters, PA-C  furosemide (LASIX) 40 MG tablet TAKE 1 OR 2 TABLETS DAILY AS NEEDED FOR SWELLING/EDEMA 07/15/16   Vicie Mutters, PA-C  gabapentin (NEURONTIN) 300 MG capsule 1 CAPSULE EACH MORNING, 2 EACH AFTERNOON, AND 2 AT BEDTIME 06/26/16   [name], MD  glucose blood (ACCU-CHEK AVIVA PLUS) test strip Check blood sugar 3 times daily-DX-E11.22. 01/17/16   [name], MD  HUMALOG MIX 75/25  KWIKPEN (75-25) 100 UNIT/ML Kwikpen 35 UNITS EVERY MORNING AND 15 TO 20 UNITS EVERY EVENING AS DIRECTED 08/06/16   Vicie Mutters, PA-C  HYDROcodone-acetaminophen (NORCO/VICODIN) 5-325 MG tablet Take 1 tablet by mouth 3 (three) times daily. PRN    Historical Provider, MD  Lancets (ACCU-CHEK SOFT TOUCH) lancets Check blood sugar 3 times daily-DX-E11.22. 01/17/16   [name], MD  levothyroxine (SYNTHROID, LEVOTHROID) 100 MCG tablet TAKE (1) TABLET DAILY BE- FORE BREAKFAST. 03/20/16   [name], MD  losartan (COZAAR) 100 MG tablet TAKE 1 TABLET ONCE A DAY FOR HIGH BLOOD PRESSURE 08/20/16   Vicie Mutters, PA-C  Magnesium 400 MG CAPS Take by mouth 4 (four) times daily.    Historical Provider, MD  MEGARED OMEGA-3 KRILL OIL 500 MG CAPS Take 500 mg by mouth 2 (two) times daily. 09/07/15   [name], MD  metFORMIN (GLUCOPHAGE) 1000 MG tablet TAKE (1) TABLET TWICE A DAY WITH MEALS (BREAKFAST AND SUPPER) 02/13/16   [name], MD  Multiple Vitamins-Minerals (ICAPS PO) Take by mouth daily.    Historical Provider, MD  OVER THE COUNTER MEDICATION as needed. Macular Degeneration refresh eye drops    Historical Provider, MD  pravastatin (PRAVACHOL) 40 MG tablet TAKE 1 TABLET DAILY FOR CHOLESTEROL 05/28/16   Vicie Mutters, PA-C  predniSONE (DELTASONE) 10 MG tablet Day 1: Take 6 tablets PO at once Day 2: Take 5 tablets PO at once Day 3: Take 4 tablets PO at once Day 4: Take 3 tablets PO at once Day 5: Take 2 tablets PO at once Day 6: Take 1 tablet PO at once 09/05/16   Gloriann Loan, PA-C  ranitidine (ZANTAC) 300 MG capsule Take 300 mg by mouth every evening.    Historical Provider, MD  Flossie Buffy SHORT PEN NEEDLES 31G X 8 MM MISC USE 4 TIMES A DAY 07/19/16   [name], MD    Family History Family History  Problem Relation Age of Onset  . Cancer Mother     colon  . Cancer Father     GI    Social History Social History  Substance Use Topics  . Smoking status: Never Smoker  .  Smokeless tobacco: Never Used  . Alcohol use No     Allergies   Detrol [tolterodine]   Review of Systems Review of Systems All other systems negative unless otherwise stated in HPI   Physical Exam Updated Vital Signs BP 155/67   Pulse 92   Temp 97.6 F (36.4 C) (Oral)   Resp 18   Ht 5\' 4"  (1.626 m)   Wt 90.7 kg   SpO2 94%   BMI 34.33 kg/m   Physical Exam  Constitutional: She is oriented to person, place, and time. She appears well-developed and well-nourished.  Non-toxic appearance. She does not have a sickly appearance. She does not appear ill.  Patient lying on her right side, appears uncomfortable.   HENT:  Head: Normocephalic and atraumatic.  Mouth/Throat: Oropharynx is clear and moist.  Eyes: Conjunctivae are normal. Pupils are equal, round, and reactive to light.  Neck: Normal range of motion. Neck supple.  Cardiovascular: Normal rate and regular rhythm.   Pulmonary/Chest: Effort normal and breath sounds normal. No accessory muscle usage or stridor. No respiratory distress. She has no wheezes. She has no rhonchi. She has no rales.  Abdominal: Soft. Bowel sounds are normal. She exhibits no distension. There is no tenderness. There is no rebound and no guarding.  Musculoskeletal: Normal range of motion. She exhibits tenderness.  No lumbar midline tenderness.  Right hip without erythema, warmth, or swelling.  Decreased active ROM in flexion due to pain. Mild pain PROM in flexion, extension, internal/external rotation. Slightly decreased strength due to pain. Normal sensation.   Lymphadenopathy:    She has no cervical adenopathy.  Neurological: She is alert and oriented to person, place, and time.  Speech clear without dysarthria.  Skin: Skin is warm and dry.  Psychiatric: She has a normal mood and affect. Her behavior is normal.     ED Treatments / Results  Labs (all labs ordered are listed, but only abnormal results are displayed) Labs Reviewed  BASIC  METABOLIC PANEL - Abnormal; Notable for the following:       Result Value   Chloride 100 (*)    Glucose, Bld 151 (*)    BUN 23 (*)    GFR calc non Af Amer 60 (*)    All other components within normal limits  URINALYSIS, ROUTINE W REFLEX MICROSCOPIC - Abnormal; Notable for the following:    APPearance CLOUDY (*)    Protein, ur 30 (*)    Bacteria, UA FEW (*)    All other components within normal limits  CBG MONITORING, ED - Abnormal; Notable for the following:    Glucose-Capillary 168 (*)    All other components within normal limits  CBC WITH DIFFERENTIAL/PLATELET    EKG  EKG Interpretation None       Radiology Mr Lumbar Spine Wo Contrast  Result Date: 09/05/2016 CLINICAL DATA:  Low back pain with right hip pain EXAM: MRI LUMBAR SPINE WITHOUT CONTRAST TECHNIQUE: Multiplanar, multisequence MR imaging of the lumbar spine was performed. No intravenous contrast was administered. COMPARISON:  None. FINDINGS: Segmentation:  Normal segmentation.  Lowest disc space L5-S1 Alignment: Normal sagittal alignment. 27 degrees of levoscoliosis L2-3. Vertebrae:  Negative for fracture or mass. Conus medullaris: Extends to the L1-2 level and appears normal. Paraspinal and other soft tissues: Retroperitoneal structures normal. Paraspinous muscles symmetric. Disc levels: L1-2:  Diffuse bulging of the disc.  Mild spinal stenosis. L2-3: Diffuse disc bulging right greater than left. Moderate right foraminal narrowing due to disc bulging and facet hypertrophy. Moderate facet hypertrophy. Mild spinal stenosis. Subarticular stenosis on the right. L3-4: Diffuse bulging of the disc. Right foraminal and extraforaminal disc protrusion with severe right foraminal stenosis and compression of the right L3 nerve root. Moderate spinal stenosis. Bilateral facet degeneration. L4-5: Disc degeneration with disc bulging. Small central disc protrusion. Moderate facet and ligamentum flavum hypertrophy. Moderately severe spinal  stenosis. Mild foraminal narrowing bilaterally L5-S1: Disc degeneration left greater than right. Left foraminal and extraforaminal large disc protrusion and associated osteophyte. Compression of left L5 nerve root in the foramen due to foraminal encroachment. Moderate facet degeneration  left greater than right. IMPRESSION: Mild spinal stenosis L1-2 Mild spinal stenosis L2-3 with subarticular and right foraminal narrowing Moderate spinal stenosis L3-4. Right foraminal and extraforaminal disc protrusion with severe right foraminal encroachment and compression of the right L3 nerve root. Moderate spinal stenosis at L4-5 with mild foraminal narrowing bilaterally Large extraforaminal disc protrusion and osteophyte on the left at L5-S1 with severe left foraminal encroachment and compression of the left L5 nerve root. 27 degrees levoscoliosis L2-3 Electronically Signed   By: Franchot Gallo M.D.   On: 09/05/2016 14:20   Mr Hip Right Wo Contrast  Result Date: 09/05/2016 CLINICAL DATA:  82 year old with severe right hip pain and limited range of motion. No reported acute injury. EXAM: MR OF THE RIGHT HIP WITHOUT CONTRAST TECHNIQUE: Multiplanar, multisequence MR imaging was performed. No intravenous contrast was administered. COMPARISON:  Radiographs same date. FINDINGS: Bones: There is no evidence of acute fracture, dislocation or avascular necrosis. The visualized bony pelvis appears normal. The visualized sacroiliac joints and symphysis pubis appear normal. Lumbar spondylosis noted. Lumbar spine findings dictated separately. Articular cartilage and labrum Articular cartilage:  Mild degenerative changes of both hips. Labrum: There is no gross labral tear or paralabral abnormality. Joint or bursal effusion Joint effusion: No significant hip joint effusion. Bursae: No focal periarticular fluid collection. Muscles and tendons Muscles and tendons: Mild common hamstring tendinosis bilaterally. The gluteus and iliopsoas  tendons appear normal. The piriformis muscles are symmetric. Other findings Miscellaneous: The visualized internal pelvic contents appear unremarkable. IMPRESSION: 1. No acute right hip or pelvic findings. 2. Lumbar spine findings dictated separately. 3. Mild common hamstring tendinosis bilaterally. Electronically Signed   By: Richardean Sale M.D.   On: 09/05/2016 14:42   Dg Hip Unilat W Or Wo Pelvis 2-3 Views Right  Result Date: 09/05/2016 CLINICAL DATA:  82 year old female with progressive right hip pain for 4 days with no known injury. Initial encounter. EXAM: DG HIP (WITH OR WITHOUT PELVIS) 2-3V RIGHT COMPARISON:  None. FINDINGS: Femoral heads are normally located. Hip joint spaces appear symmetric and within normal limits for age. There is mild acetabular and femoral head spurring. The entire left iliac wing is not included. The visible pelvis is intact. Sacral ala and SI joints appear normal. There is partially visible levoconvex lumbar scoliosis with advanced lumbar disc and endplate degeneration. Mild Calcified aortic atherosclerosis. Negative visible bowel gas pattern. Proximal right femur is intact. Grossly intact proximal left femur. IMPRESSION: No acute osseous abnormality identified about the right hip or pelvis. Mild for age hip osteoarthritis. Partially visible more advanced lumbar spine degeneration. Electronically Signed   By: Genevie Ann M.D.   On: 09/05/2016 11:07    Procedures Procedures (including critical care time)  Medications Ordered in ED Medications  morphine 4 MG/ML injection 4 mg (4 mg Intravenous Given 09/05/16 1044)  morphine 4 MG/ML injection 4 mg (4 mg Intravenous Given 09/05/16 1251)  morphine 4 MG/ML injection 4 mg (4 mg Intravenous Given 09/05/16 1521)  cyclobenzaprine (FLEXERIL) tablet 10 mg (10 mg Oral Given 09/05/16 1539)     Initial Impression / Assessment and Plan / ED Course  I have reviewed the triage vital signs and the nursing notes.  Pertinent labs &  imaging results that were available during my care of the patient were reviewed by me and considered in my medical decision making (see chart for details).  Clinical Course    Patient with hx of arthritis and chronic back pain presents with 2-3 days of atraumatic severe  right hip pain that radiates to the groin and right knee.  No systemic symptoms.  No abdominal pain, diarrhea/constipation, or urinary symptoms.  Vitals stable.  Right hip without erythema, warmth, or swelling.  Pain with active ROM.  Mild pain with passive ROM.  She has been taking Norco without relief and she has not been able to bear weight on her right hip.  She normally ambulates with a cane, but has required increased assistance.  Will obtain plain films and labs as well as IV pain medication.   Plain films and labs largely unremarkable.  Patient with continued severe pain despite 8 mg of morphine.  Will obtain MRI lumbar and right hip.   Lumbar spine with diffuse spinal disease, right hip normal.  Spoke with Dr. Stann Mainland, [place], who recommends prednisone dosepak, flexeril, and steroids and calling their office to schedule close follow up. Will hold naprosyn due to her CKD.  Return precautions discussed. Stable for discharge.  Case has been discussed with and seen by Dr. Ralene Bathe who agrees with the above plan for discharge.  Final Clinical Impressions(s) / ED Diagnoses   Final diagnoses:  Right hip pain  Lumbar disc disease    New Prescriptions Discharge Medication List as of 09/05/2016  3:43 PM    START taking these medications   Details  cyclobenzaprine (FLEXERIL) 10 MG tablet Take 1 tablet (10 mg total) by mouth 2 (two) times daily as needed for muscle spasms., Starting Thu 09/05/2016, Print    predniSONE (DELTASONE) 10 MG tablet Day 1: Take 6 tablets PO at once Day 2: Take 5 tablets PO at once Day 3: Take 4 tablets PO at once Day 4: Take 3 tablets PO at once Day 5: Take 2 tablets PO at once Day 6:  Take 1 tablet PO at once, Print             Gloriann Loan, PA-C 09/05/16 [place], MD 09/06/16 [phone]

## 2016-09-05 NOTE — ED Notes (Signed)
Patient transported to X-ray 

## 2016-09-05 NOTE — ED Notes (Signed)
Pt able to stand and pivot to bed with assistance. Husband at bedside.

## 2016-09-05 NOTE — ED Notes (Signed)
CBG: 168. RN notified. 

## 2016-09-05 NOTE — ED Notes (Signed)
ED Provider at bedside. 

## 2016-09-05 NOTE — Discharge Instructions (Signed)
Your MRI of your right hip is normal.  Your lumbar spine MRI shows diffuse disc disease, and this is likely the cause of your pain.  Continue taking your hydrocodone.  Start taking Flexeril and prednisone for your pain as well.  Prednisone may make your blood sugar go up, so please check it regularly.  Call Dr. Nelva Bush' office today to schedule an appointment as soon as possible.  Return to the ED for any new or worsening symptoms.

## 2016-09-10 ENCOUNTER — Other Ambulatory Visit: Payer: Self-pay | Admitting: Internal Medicine

## 2016-09-16 ENCOUNTER — Other Ambulatory Visit: Payer: Self-pay | Admitting: Physician Assistant

## 2016-09-16 ENCOUNTER — Other Ambulatory Visit: Payer: Self-pay | Admitting: Internal Medicine

## 2016-09-17 DIAGNOSIS — M5136 Other intervertebral disc degeneration, lumbar region: Secondary | ICD-10-CM | POA: Diagnosis not present

## 2016-09-19 DIAGNOSIS — M48062 Spinal stenosis, lumbar region with neurogenic claudication: Secondary | ICD-10-CM | POA: Diagnosis not present

## 2016-09-19 DIAGNOSIS — M5136 Other intervertebral disc degeneration, lumbar region: Secondary | ICD-10-CM | POA: Diagnosis not present

## 2016-09-19 DIAGNOSIS — M5416 Radiculopathy, lumbar region: Secondary | ICD-10-CM | POA: Diagnosis not present

## 2016-09-19 DIAGNOSIS — Z79891 Long term (current) use of opiate analgesic: Secondary | ICD-10-CM | POA: Diagnosis not present

## 2016-09-24 DIAGNOSIS — M545 Low back pain: Secondary | ICD-10-CM | POA: Diagnosis not present

## 2016-09-24 DIAGNOSIS — M5136 Other intervertebral disc degeneration, lumbar region: Secondary | ICD-10-CM | POA: Diagnosis not present

## 2016-09-24 DIAGNOSIS — M5431 Sciatica, right side: Secondary | ICD-10-CM | POA: Diagnosis not present

## 2016-09-24 DIAGNOSIS — M48062 Spinal stenosis, lumbar region with neurogenic claudication: Secondary | ICD-10-CM | POA: Diagnosis not present

## 2016-10-01 DIAGNOSIS — H353221 Exudative age-related macular degeneration, left eye, with active choroidal neovascularization: Secondary | ICD-10-CM | POA: Diagnosis not present

## 2016-10-02 ENCOUNTER — Other Ambulatory Visit: Payer: Self-pay | Admitting: Physician Assistant

## 2016-10-03 ENCOUNTER — Ambulatory Visit (INDEPENDENT_AMBULATORY_CARE_PROVIDER_SITE_OTHER): Payer: Medicare Other | Admitting: Internal Medicine

## 2016-10-03 ENCOUNTER — Encounter: Payer: Self-pay | Admitting: Internal Medicine

## 2016-10-03 VITALS — BP 152/76 | HR 80 | Temp 97.8°F | Ht 65.0 in | Wt 201.2 lb

## 2016-10-03 DIAGNOSIS — E782 Mixed hyperlipidemia: Secondary | ICD-10-CM

## 2016-10-03 DIAGNOSIS — I10 Essential (primary) hypertension: Secondary | ICD-10-CM

## 2016-10-03 DIAGNOSIS — E559 Vitamin D deficiency, unspecified: Secondary | ICD-10-CM | POA: Diagnosis not present

## 2016-10-03 DIAGNOSIS — N183 Chronic kidney disease, stage 3 unspecified: Secondary | ICD-10-CM

## 2016-10-03 DIAGNOSIS — Z79899 Other long term (current) drug therapy: Secondary | ICD-10-CM | POA: Diagnosis not present

## 2016-10-03 DIAGNOSIS — E039 Hypothyroidism, unspecified: Secondary | ICD-10-CM

## 2016-10-03 DIAGNOSIS — E1122 Type 2 diabetes mellitus with diabetic chronic kidney disease: Secondary | ICD-10-CM

## 2016-10-03 LAB — CBC WITH DIFFERENTIAL/PLATELET
BASOS ABS: 93 {cells}/uL (ref 0–200)
Basophils Relative: 1 %
EOS PCT: 5 %
Eosinophils Absolute: 465 cells/uL (ref 15–500)
HEMATOCRIT: 38.6 % (ref 35.0–45.0)
HEMOGLOBIN: 12.5 g/dL (ref 11.7–15.5)
Lymphocytes Relative: 25 %
Lymphs Abs: 2325 cells/uL (ref 850–3900)
MCH: 28.3 pg (ref 27.0–33.0)
MCHC: 32.4 g/dL (ref 32.0–36.0)
MCV: 87.5 fL (ref 80.0–100.0)
MONO ABS: 1116 {cells}/uL — AB (ref 200–950)
MPV: 10.1 fL (ref 7.5–12.5)
Monocytes Relative: 12 %
NEUTROS PCT: 57 %
Neutro Abs: 5301 cells/uL (ref 1500–7800)
Platelets: 371 10*3/uL (ref 140–400)
RBC: 4.41 MIL/uL (ref 3.80–5.10)
RDW: 13.9 % (ref 11.0–15.0)
WBC: 9.3 10*3/uL (ref 3.8–10.8)

## 2016-10-03 LAB — TSH: TSH: 4.56 mIU/L — ABNORMAL HIGH

## 2016-10-03 NOTE — Patient Instructions (Signed)

## 2016-10-03 NOTE — Progress Notes (Signed)
[facility] [name], M.D.        Uvaldo Bristle. Silverio Lay, P.A.-C       Starlyn Skeans, P.A.-C  [facility]                [address] [place] [id number] Telephone [phone] Telefax [phone] ______________________________________________________________________     This very nice 83 y.o. WWF presents for 3 month follow up with Hypertension, Hyperlipidemia, T2_NIDDM and Vitamin D Deficiency.      Patient is treated for HTN & BP has been controlled at home. Today's BP is sl elevated at 152/76. Patient has had no complaints of any cardiac type chest pain, palpitations, dyspnea/orthopnea/PND, dizziness, claudication, or dependent edema.     Hyperlipidemia is controlled with diet & meds. Patient denies myalgias or other med SE's. Last Lipids were at goa;l albeit elevated Trig's: Lab Results  Component Value Date   CHOL 169 07/01/2016   HDL 47 07/01/2016   LDLCALC 72 07/01/2016   TRIG 250 (H) 07/01/2016   CHOLHDL 3.6 07/01/2016      Also, the patient has history of insulin requiring T2_DM w/ CKD 3 (GFR 40) since 2003. She does take Metformin 1000 mg bid and also Humalog 75/25 mix 30 u bid. Patient is Morbidly Obese (BMI 33+) and admits her Gluttony and poor dietary compliance. She denies symptoms of reactive hypoglycemia, diabetic polys, paresthesias or visual blurring.  Last A1c was not at goal:  Lab Results  Component Value Date   HGBA1C 8.8 (H) 07/01/2016     Patient had thyroid surgery in the 1970's and has been on replacement since with periodic monitoring to assure appropriate dosing.  Further, the patient also has history of Vitamin D Deficiency in 2013 of "26" and supplements vitamin D inconsistently. Last vitamin D was still low:  Lab Results  Component Value Date   VD25OH 46 03/26/2016   Current Outpatient Prescriptions on File Prior to Visit  Medication Sig  . aspirin 81 MG  tablet Take 81 mg by mouth daily.  Marland Kitchen CALCIUM 600 Take 600 mg by mouth daily.  Marland Kitchen VITAMIN D 5000 UNITS  Take 5,000 Units by mouth daily.   . cyclobenzaprine  10 MG tablet Take 1 tab 2  times daily as needed for muscle spasms.  . diclofenac  1 % GEL Apply 4 g topically 4 (four) times daily.  . fenofibrate 160 MG tablet TAKE 1 TABLET DAILY  . furosemide  40 MG tablet TAKE 1 OR 2 TABLETS DAILY AS NEEDED   . gabapentin 300 MG capsule 1 CAP  MORNING, 2 AFTERNOON & 2 AT BEDTIME  . HUMALOG MIX 75/25 KWIKPEN  35 UNITS EVERY MORNING AND 15 TO 20 UNITS   . NORCO 5-325 MG Take 1 tablet by mouth 3 (three) times daily. PRN  . levothyroxine  100 MCG tablet TAKE (1) TABLET DAILY BE- FORE BREAKFAST.  Marland Kitchen losartan  100 MG tablet TAKE 1 TABLET ONCE A DAY FOR HIGH BLOOD PRESSURE  . Magnesium 400 MG CAPS Take by mouth 4 (four) times daily.  Marland Kitchen MEGARED OMEGA-3 KRILL OIL 500 MG  Take 500 mg by mouth 2 (two) times daily.  . metFORMIN  1000 MG tablet TAKE (1) TABLET TWICE A DAY WITH MEALS   . Multi-Vit-Min (ICAPS PO) Take by mouth daily.  . Macular Degeneration refresh eye gtts as needed.   Marland Kitchen  pravastatin  40 MG tablet TAKE 1 TABLET DAILY FOR CHOLESTEROL  . ranitidine  300 MG capsule Take 300 mg by mouth every evening.   Allergies  Allergen Reactions  . Detrol [Tolterodine]    PMHx:   Past Medical History:  Diagnosis Date  . Arthritis   . GERD (gastroesophageal reflux disease)   . HOH (hard of hearing)   . Hyperlipidemia   . Hypertension   . Hypothyroid   . IDDM (insulin dependent diabetes mellitus) ([place])   . Leg swelling   . Macular degeneration   . Shortness of breath dyspnea    with exertion  . Sleep apnea    does not wear CPAP  . Varicose veins   . Wears glasses    Immunization History  Administered Date(s) Administered  . DT 10/20/2014  . Influenza, High Dose Seasonal PF 07/01/2016  . Influenza-Unspecified 06/22/2013, 06/16/2014, 06/27/2014, 07/06/2015  . Pneumococcal Conjugate-13 04/05/2014   . Pneumococcal-Unspecified 09/09/2004  . Td 09/10/2003  . Zoster 09/09/2005   Past Surgical History:  Procedure Laterality Date  . ANTERIOR CERVICAL DECOMP/DISCECTOMY FUSION N/A 11/08/2014   Procedure: Cervical three-four,  Cervical six-seven anterior cervical decompression with fusion plating and bonegraft;  Surgeon: Ashok Pall, MD;  Location: [facility];  Service: Neurosurgery;  Laterality: N/A;  Cervical three-four,  Cervical six-seven anterior cervical decompression with fusion plating and bonegraft  . BIOPSY THYROID     benign thyroid nodule removal  . CATARACT EXTRACTION, BILATERAL    . COLONOSCOPY W/ BIOPSIES AND POLYPECTOMY    . ENDOMETRIAL BIOPSY  2000  . SPINE SURGERY Left  11/08/2014   Left ACDF   . TUBAL LIGATION    . VARICOSE VEIN SURGERY     FHx:    Reviewed / unchanged  SHx:    Reviewed / unchanged  Systems Review:  Constitutional: Denies fever, chills, wt changes, headaches, insomnia, fatigue, night sweats, change in appetite. Eyes: Denies redness, blurred vision, diplopia, discharge, itchy, watery eyes.  ENT: Denies discharge, congestion, post nasal drip, epistaxis, sore throat, earache, hearing loss, dental pain, tinnitus, vertigo, sinus pain, snoring.  CV: Denies chest pain, palpitations, irregular heartbeat, syncope, dyspnea, diaphoresis, orthopnea, PND, claudication or edema. Respiratory: denies cough, dyspnea, DOE, pleurisy, hoarseness, laryngitis, wheezing.  Gastrointestinal: Denies dysphagia, odynophagia, heartburn, reflux, water brash, abdominal pain or cramps, nausea, vomiting, bloating, diarrhea, constipation, hematemesis, melena, hematochezia  or hemorrhoids. Genitourinary: Denies dysuria, frequency, urgency, nocturia, hesitancy, discharge, hematuria or flank pain. Musculoskeletal: Denies arthralgias, myalgias, stiffness, jt. swelling, pain, limping or strain/sprain.  Skin: Denies pruritus, rash, hives, warts, acne, eczema or change in skin  lesion(s). Neuro: No weakness, tremor, incoordination, spasms, paresthesia or pain. Psychiatric: Denies confusion, memory loss or sensory loss. Endo: Denies change in weight, skin or hair change.  Heme/Lymph: No excessive bleeding, bruising or enlarged lymph nodes.  Physical Exam  BP (!) 152/76   Pulse 80   Temp 97.8 F (36.6 C)   Ht 5\' 5"  (1.651 m)   Wt 201 lb 3.2 oz (91.3 kg)   BMI 33.48 kg/m   Appears Over nourished and in no distress.  Eyes: PERRLA, EOMs, conjunctiva no swelling or erythema. Sinuses: No frontal/maxillary tenderness ENT/Mouth: EAC's clear, TM's nl w/o erythema, bulging. Nares clear w/o erythema, swelling, exudates. Oropharynx clear without erythema or exudates. Oral hygiene is good. Tongue normal, non obstructing. Hearing intact.  Neck: Supple. Thyroid nl. Car 2+/2+ without bruits, nodes or JVD. Chest: Respirations nl with BS clear & equal w/o rales, rhonchi, wheezing or  stridor.  Cor: Heart sounds normal w/ regular rate and rhythm without sig. murmurs, gallops, clicks, or rubs. Peripheral pulses normal and equal  without edema.  Abdomen: Soft & bowel sounds normal. Non-tender w/o guarding, rebound, hernias, masses, or organomegaly.  Lymphatics: Unremarkable.  Musculoskeletal: Full ROM all peripheral extremities, joint stability, 5/5 strength, and normal gait.  Skin: Warm, dry without exposed rashes, lesions or ecchymosis apparent.  Neuro: Cranial nerves intact, reflexes equal bilaterally. Sensory-motor testing grossly intact. Tendon reflexes grossly intact.  Pysch: Alert & oriented x 3.  Insight and judgement nl & appropriate. No ideations.  Assessment and Plan:  1. Essential hypertension  - Continue medication, monitor blood pressure at home.  - Continue DASH diet. Reminder to go to the ER if any CP,  SOB, nausea, dizziness, severe HA, changes vision/speech,  left arm numbness and tingling and jaw pain. - CBC with Differential/Platelet - BASIC METABOLIC  PANEL WITH GFR - TSH  2. Mixed hyperlipidemia  - Continue diet/meds, exercise,& lifestyle modifications.  - Continue monitor periodic cholesterol/liver & renal functions  - Hepatic function panel - Lipid panel - TSH  3. CKD stage 3 due to type 2 diabetes mellitus ([place])  - discussed with patient & her daughter that her elevated A1c is a reflection of her poor dietary compliance - Continue diet, exercise, lifestyle modifications.  - Monitor appropriate labs. - Hemoglobin A1c  4. Vitamin D deficiency  - Continue supplementation. - VITAMIN D 25 Hydroxy   5. Hypothyroidism  - TSH  6. Medication management  - CBC with Differential/Platelet - BASIC METABOLIC PANEL WITH GFR - Hepatic function panel - Magnesium - TSH - Hemoglobin A1c - VITAMIN D 25 Hydroxy        Recommended regular exercise, BP monitoring, weight control, and discussed med and SE's. Recommended labs to assess and monitor clinical status. Further disposition pending results of labs. Over 30 minutes of exam, counseling, chart review was performed

## 2016-10-04 ENCOUNTER — Other Ambulatory Visit: Payer: Self-pay | Admitting: Internal Medicine

## 2016-10-04 ENCOUNTER — Other Ambulatory Visit: Payer: Self-pay | Admitting: Physician Assistant

## 2016-10-04 LAB — BASIC METABOLIC PANEL WITH GFR
BUN: 30 mg/dL — AB (ref 7–25)
CALCIUM: 9.8 mg/dL (ref 8.6–10.4)
CO2: 27 mmol/L (ref 20–31)
CREATININE: 0.94 mg/dL — AB (ref 0.60–0.88)
Chloride: 95 mmol/L — ABNORMAL LOW (ref 98–110)
GFR, Est African American: 64 mL/min (ref >=60)
GFR, Est Non African American: 56 mL/min — ABNORMAL LOW (ref >=60)
GLUCOSE: 135 mg/dL — AB (ref 65–99)
Potassium: 4.3 mmol/L (ref 3.5–5.3)
Sodium: 138 mmol/L (ref 135–146)

## 2016-10-04 LAB — VITAMIN D 25 HYDROXY (VIT D DEFICIENCY, FRACTURES): Vit D, 25-Hydroxy: 43 ng/mL (ref 30–100)

## 2016-10-04 LAB — HEPATIC FUNCTION PANEL
ALT: 17 U/L (ref 6–29)
AST: 18 U/L (ref 10–35)
Albumin: 3.9 g/dL (ref 3.6–5.1)
Alkaline Phosphatase: 43 U/L (ref 33–130)
Bilirubin, Direct: 0.1 mg/dL (ref <=0.2)
Indirect Bilirubin: 0.2 mg/dL (ref 0.2–1.2)
TOTAL PROTEIN: 6.6 g/dL (ref 6.1–8.1)
Total Bilirubin: 0.3 mg/dL (ref 0.2–1.2)

## 2016-10-04 LAB — MAGNESIUM: MAGNESIUM: 1.8 mg/dL (ref 1.5–2.5)

## 2016-10-04 LAB — LIPID PANEL
CHOLESTEROL: 164 mg/dL (ref <200)
HDL: 49 mg/dL — ABNORMAL LOW (ref >50)
LDL Cholesterol: 78 mg/dL (ref <100)
Total CHOL/HDL Ratio: 3.3 Ratio (ref <5.0)
Triglycerides: 184 mg/dL — ABNORMAL HIGH (ref <150)
VLDL: 37 mg/dL — AB (ref <30)

## 2016-10-04 LAB — HEMOGLOBIN A1C
HEMOGLOBIN A1C: 8.9 % — AB (ref <5.7)
MEAN PLASMA GLUCOSE: 209 mg/dL

## 2016-10-07 ENCOUNTER — Other Ambulatory Visit: Payer: Self-pay | Admitting: *Deleted

## 2016-10-07 MED ORDER — INSULIN LISPRO PROT & LISPRO (75-25 MIX) 100 UNIT/ML KWIKPEN: PEN_INJECTOR | SUBCUTANEOUS | 1 refills | Status: DC

## 2016-10-08 DIAGNOSIS — E1142 Type 2 diabetes mellitus with diabetic polyneuropathy: Secondary | ICD-10-CM | POA: Diagnosis not present

## 2016-10-08 DIAGNOSIS — B351 Tinea unguium: Secondary | ICD-10-CM | POA: Diagnosis not present

## 2016-10-08 DIAGNOSIS — L84 Corns and callosities: Secondary | ICD-10-CM | POA: Diagnosis not present

## 2016-10-11 ENCOUNTER — Telehealth: Payer: Self-pay | Admitting: *Deleted

## 2016-10-11 NOTE — Telephone Encounter (Signed)
Patient called with questions on Humalog dosage.  States she was confused on the sig stating 30-50 units BID.  Per Dr. Idell Pickles last ov note on 10/03/16, patient was advised to take 30 units BID.  Patient expressed understanding.

## 2016-10-16 DIAGNOSIS — M5431 Sciatica, right side: Secondary | ICD-10-CM | POA: Diagnosis not present

## 2016-10-29 DIAGNOSIS — M48062 Spinal stenosis, lumbar region with neurogenic claudication: Secondary | ICD-10-CM | POA: Diagnosis not present

## 2016-10-29 DIAGNOSIS — Z79891 Long term (current) use of opiate analgesic: Secondary | ICD-10-CM | POA: Diagnosis not present

## 2016-10-29 DIAGNOSIS — M545 Low back pain: Secondary | ICD-10-CM | POA: Diagnosis not present

## 2016-10-29 DIAGNOSIS — M5431 Sciatica, right side: Secondary | ICD-10-CM | POA: Diagnosis not present

## 2016-11-13 ENCOUNTER — Other Ambulatory Visit: Payer: Self-pay | Admitting: Internal Medicine

## 2016-11-13 DIAGNOSIS — M5416 Radiculopathy, lumbar region: Secondary | ICD-10-CM | POA: Diagnosis not present

## 2016-11-20 ENCOUNTER — Other Ambulatory Visit: Payer: Self-pay | Admitting: Internal Medicine

## 2016-11-25 DIAGNOSIS — M5431 Sciatica, right side: Secondary | ICD-10-CM | POA: Diagnosis not present

## 2016-11-25 DIAGNOSIS — Z79891 Long term (current) use of opiate analgesic: Secondary | ICD-10-CM | POA: Diagnosis not present

## 2016-11-25 DIAGNOSIS — M545 Low back pain: Secondary | ICD-10-CM | POA: Diagnosis not present

## 2016-11-25 DIAGNOSIS — M48062 Spinal stenosis, lumbar region with neurogenic claudication: Secondary | ICD-10-CM | POA: Diagnosis not present

## 2016-11-26 ENCOUNTER — Other Ambulatory Visit: Payer: Self-pay | Admitting: Internal Medicine

## 2016-12-03 DIAGNOSIS — H353134 Nonexudative age-related macular degeneration, bilateral, advanced atrophic with subfoveal involvement: Secondary | ICD-10-CM | POA: Diagnosis not present

## 2016-12-03 DIAGNOSIS — H353221 Exudative age-related macular degeneration, left eye, with active choroidal neovascularization: Secondary | ICD-10-CM | POA: Diagnosis not present

## 2016-12-03 DIAGNOSIS — H3570 Unspecified separation of retinal layers: Secondary | ICD-10-CM | POA: Diagnosis not present

## 2016-12-03 DIAGNOSIS — H04123 Dry eye syndrome of bilateral lacrimal glands: Secondary | ICD-10-CM | POA: Diagnosis not present

## 2016-12-03 DIAGNOSIS — H43812 Vitreous degeneration, left eye: Secondary | ICD-10-CM | POA: Diagnosis not present

## 2016-12-17 DIAGNOSIS — M79676 Pain in unspecified toe(s): Secondary | ICD-10-CM | POA: Diagnosis not present

## 2016-12-17 DIAGNOSIS — B351 Tinea unguium: Secondary | ICD-10-CM | POA: Diagnosis not present

## 2016-12-17 DIAGNOSIS — E1142 Type 2 diabetes mellitus with diabetic polyneuropathy: Secondary | ICD-10-CM | POA: Diagnosis not present

## 2016-12-17 DIAGNOSIS — L84 Corns and callosities: Secondary | ICD-10-CM | POA: Diagnosis not present

## 2016-12-24 ENCOUNTER — Other Ambulatory Visit: Payer: Self-pay | Admitting: Internal Medicine

## 2016-12-30 ENCOUNTER — Other Ambulatory Visit: Payer: Self-pay | Admitting: Internal Medicine

## 2016-12-30 ENCOUNTER — Other Ambulatory Visit: Payer: Self-pay | Admitting: Physician Assistant

## 2017-01-02 NOTE — Progress Notes (Signed)
MEDICARE ANNUAL WELLNESS VISIT AND FOLLOW UP  Assessment:    Essential hypertension - continue medications, DASH diet, exercise and monitor at home. Call if greater than 130/80.  - CBC with Differential/Platelet - BASIC METABOLIC PANEL WITH GFR - Hepatic function panel  Chronic venous insufficiency weight loss discussed, continue compression stockings and elevation   Hypothyroidism, unspecified hypothyroidism type - TSH   Type 2 diabetes mellitus with diabetic chronic kidney disease Discussed general issues about diabetes pathophysiology and management., Educational material distributed., Suggested low cholesterol diet., Encouraged aerobic exercise., Discussed foot care., Reminded to get yearly retinal exam. - Hemoglobin A1c  Hyperlipidemia -continue medications, check lipids, decrease fatty foods, increase activity.  - Lipid panel  Macular degeneration Cont follow up eye  Vitamin D deficiency - Vit D  25 hydroxy (rtn osteoporosis monitoring)  Medication management - Magnesium  Morbid Obesity Obesity with co morbidities- long discussion about weight loss, diet, and exercise   DDD (degenerative disc disease), lumbar RICE, NSAIDS, exercises given, if not better get xray and PT referral or ortho referral.    CKD (chronic kidney disease) stage 3, GFR 30-59 ml/min Increase fluids, avoid NSAIDS, monitor sugars, will monitor   DM polyneuropathy Continue gabpaentin, may need referral neuro, decrease sugars    Plan:   During the course of the visit the patient was educated and counseled about appropriate screening and preventive services including:    Pneumococcal vaccine   Influenza vaccine  Td vaccine  Screening electrocardiogram  Screening mammography  Bone densitometry screening  Colorectal cancer screening  Diabetes screening  Glaucoma screening  Nutrition counseling   Advanced directives: given info/requested   Subjective:   [facility] is a 83  y.o. female who presents for Medicare Annual Wellness Visit and 3 month follow up on hypertension, diabetes, hyperlipidemia, vitamin D def.   Lost her boyfriend of 22 year in Dec.   Her blood pressure has been controlled at home, today their BP is BP: 130/72 She does not workout. She denies chest pain, shortness of breath, dizziness.  She is on cholesterol medication and denies myalgias. Her cholesterol is at goal. The cholesterol last visit was:   Lab Results  Component Value Date   CHOL 164 10/03/2016   HDL 49 (L) 10/03/2016   LDLCALC 78 10/03/2016   TRIG 184 (H) 10/03/2016   CHOLHDL 3.3 10/03/2016   She has been working on diet and exercise for diabetes, she has Stage 3 CKD due to DM and complains of worsening bilateral feet paresthesias, she is on gabapentin 9 total a day, max,, she is on Humalog 70/30 mix 30/30 BID recently switched, she is on bASA , she is on ARB and denies hypoglycemia , polydipsia and visual disturbances. She denies hypoglycemia, and sugars are running around 200 in the AM and in the evening too, did have over 300 one day. Last A1C in the office was:  Lab Results  Component Value Date   HGBA1C 8.9 (H) 10/03/2016   She has been having some dysuria with initiation of urine stream, she has had some frequency, has unchanged incontinence, has tried azo, helps some but not much. She denies flank pain, fever, chills, hematuria.  Patient is on Vitamin D supplement. Lab Results  Component Value Date   VD25OH 56 10/03/2016     She is on thyroid medication. Her medication was not changed last visit, she is on one pill daily. Patient denies nervousness, palpitations and weight changes.  Lab Results  Component Value Date  TSH 4.56 (H) 10/03/2016   Follows with Dr. Nelva Bush for lower back pain/ and gets injections with him in her left hip. Walks with cane. She is now on oxycodone from Dr. Nelva Bush, will take no more than one a day.  She BMI is Body mass index is 33.08 kg/m.,  she is working on diet and exercise and has done well.  Wt Readings from Last 3 Encounters:  01/03/17 198 lb 12.8 oz (90.2 kg)  10/03/16 201 lb 3.2 oz (91.3 kg)  09/05/16 200 lb (90.7 kg)    Names of Other Physician/Practitioners you currently use: 1. [facility]- here for primary care 2. Irving Shows, dentist, last visit April 2018 Patient Care Team: [name], MD as PCP - General (Internal Medicine) Jeryl Columbia, MD as Consulting Physician (Gastroenterology) Lelon Perla, MD as Consulting Physician (Cardiology) Johna Sheriff, MD as Consulting Physician (Ophthalmology) Hurman Horn, MD as Consulting Physician (Ophthalmology)- Feb 2018 Deneise Lever, MD as Consulting Physician (Pulmonary Disease) Nelva Bush- gets left hip injections.   Medication Review Current Outpatient Prescriptions on File Prior to Visit  Medication Sig Dispense Refill  . aspirin 81 MG tablet Take 81 mg by mouth daily.    . Calcium Carbonate (CALCIUM 600 PO) Take 600 mg by mouth daily.    . Cholecalciferol (VITAMIN D3) 5000 UNITS CAPS Take 5,000 Units by mouth daily.     . cyclobenzaprine (FLEXERIL) 10 MG tablet Take 1 tablet (10 mg total) by mouth 2 (two) times daily as needed for muscle spasms. 20 tablet 0  . diclofenac sodium (VOLTAREN) 1 % GEL Apply 4 g topically 4 (four) times daily. 100 g 3  . fenofibrate 160 MG tablet TAKE 1 TABLET DAILY 30 tablet 5  . furosemide (LASIX) 40 MG tablet TAKE 1 OR 2 TABLETS DAILY AS NEEDED FOR SWELLING/EDEMA 60 tablet 1  . gabapentin (NEURONTIN) 300 MG capsule 1 CAPSULE EACH MORNING, 2 EACH AFTERNOON, AND 2 AT BEDTIME 450 capsule 1  . glucose blood (ACCU-CHEK AVIVA PLUS) test strip Check blood sugar 3 times daily-DX-E11.22. 100 each 6  . HUMALOG MIX 75/25 KWIKPEN (75-25) 100 UNIT/ML Kwikpen INJECT 30 TO 50 UNITS 2 TIMES A DAY AS DIRECTED 15 mL 1  . HYDROcodone-acetaminophen (NORCO/VICODIN) 5-325 MG tablet Take 1 tablet by mouth 3  (three) times daily. PRN    . Lancets (ACCU-CHEK SOFT TOUCH) lancets Check blood sugar 3 times daily-DX-E11.22. 100 each 6  . levothyroxine (SYNTHROID, LEVOTHROID) 100 MCG tablet TAKE (1) TABLET DAILY BE- FORE BREAKFAST. 90 tablet 0  . losartan (COZAAR) 100 MG tablet TAKE 1 TABLET ONCE A DAY FOR HIGH BLOOD PRESSURE 90 tablet 0  . Magnesium 400 MG CAPS Take by mouth 4 (four) times daily.    Marland Kitchen MEGARED OMEGA-3 KRILL OIL 500 MG CAPS Take 500 mg by mouth 2 (two) times daily.    . metFORMIN (GLUCOPHAGE) 1000 MG tablet TAKE (1) TABLET TWICE A DAY WITH MEALS (BREAKFAST AND SUPPER) 180 tablet 0  . Multiple Vitamins-Minerals (ICAPS PO) Take by mouth daily.    Marland Kitchen OVER THE COUNTER MEDICATION as needed. Macular Degeneration refresh eye drops    . oxyCODONE-acetaminophen (PERCOCET) 10-325 MG tablet     . pravastatin (PRAVACHOL) 40 MG tablet TAKE 1 TABLET DAILY FOR CHOLESTEROL 90 tablet 0  . predniSONE (DELTASONE) 10 MG tablet Day 1: Take 6 tablets PO at once Day 2: Take 5 tablets PO at once Day 3: Take 4 tablets PO at once  Day 4: Take 3 tablets PO at once Day 5: Take 2 tablets PO at once Day 6: Take 1 tablet PO at once 21 tablet 0  . ranitidine (ZANTAC) 300 MG tablet TAKE 1 TABLET AT BEDTIME FOR ACID REFLUX AND HEART BURN 90 tablet 0  . ULTICARE SHORT PEN NEEDLES 31G X 8 MM MISC USE 4 TIMES A DAY 100 each 3   No current facility-administered medications on file prior to visit.     Current Problems (verified) Patient Active Problem List   Diagnosis Date Noted  . Diabetes, polyneuropathy ([place]) 12/15/2015  . Cervical spondylosis with myelopathy 11/08/2014  . Obesity 10/20/2014  . DDD (degenerative disc disease), lumbar 10/20/2014  . CKD (chronic kidney disease) stage 3, GFR 30-59 ml/min 10/20/2014  . Chronic venous insufficiency 12/31/2013  . Vitamin D deficiency 12/30/2013  . Medication management 12/30/2013  . Hypertension   . Hypothyroid   . CKD stage 3 due to type 2 diabetes mellitus ([place])   .  Hyperlipidemia   . Macular degeneration     Screening Tests Immunization History  Administered Date(s) Administered  . DT 10/20/2014  . Influenza, High Dose Seasonal PF 07/01/2016  . Influenza-Unspecified 06/22/2013, 06/16/2014, 06/27/2014, 07/06/2015  . Pneumococcal Conjugate-13 04/05/2014  . Pneumococcal-Unspecified 09/09/2004  . Td 09/10/2003  . Zoster 09/09/2005    Preventative care: Last colonoscopy: 08/032016 Last mammogram: 12/2015 Last pap smear/pelvic exam: remote  DEXA: 2013 MRI brain 10/2014 CXR 12/2015  Prior vaccinations: TD or Tdap: 2016 Influenza: 2017 Pneumococcal: 2006  Prevnar 13 2015 Shingles/Zostavax: 2007  Allergies Allergies  Allergen Reactions  . Detrol [Tolterodine]     SURGICAL HISTORY She  has a past surgical history that includes Biopsy thyroid; Varicose vein surgery; Cataract extraction, bilateral; Endometrial biopsy (2000); Colonoscopy w/ biopsies and polypectomy; Tubal ligation; Anterior cervical decomp/discectomy fusion (N/A, 11/08/2014); and Spine surgery (Left,  11/08/2014). FAMILY HISTORY Her family history includes Cancer in her father and mother. SOCIAL HISTORY She  reports that she has never smoked. She has never used smokeless tobacco. She reports that she does not drink alcohol or use drugs.  MEDICARE WELLNESS OBJECTIVES: Physical activity: Current Exercise Habits: The patient does not participate in regular exercise at present, Exercise limited by: orthopedic condition(s);neurologic condition(s) Cardiac risk factors: Cardiac Risk Factors include: advanced age (>35men, >80 women);diabetes mellitus;dyslipidemia;hypertension;obesity (BMI >30kg/m2);sedentary lifestyle Depression/mood screen:   Depression screen [facility] 2/9 01/03/2017  Decreased Interest 0  Down, Depressed, Hopeless 0  PHQ - 2 Score 0    ADLs:  In your present state of health, do you have any difficulty performing the following activities: 10/03/2016 03/26/2016   Hearing? N N  Vision? N N  Difficulty concentrating or making decisions? N N  Walking or climbing stairs? N N  Dressing or bathing? N N  Doing errands, shopping? N N  Some recent data might be hidden     Cognitive Testing  Alert? Yes  Normal Appearance?Yes  Oriented to person? Yes  Place? Yes   Time? Yes  Recall of three objects?  Yes  Can perform simple calculations? Yes  Displays appropriate judgment?Yes  Can read the correct time from a watch face?Yes  EOL planning: Does Patient Have a Medical Advance Directive?: No Would patient like information on creating a medical advance directive?: Yes (ED - Information included in AVS)    Objective:   Blood pressure 130/72, pulse 83, temperature 97.5 F (36.4 C), resp. rate 14, height 5\' 5"  (1.651 m), weight 198 lb 12.8 oz (90.2  kg), SpO2 96 %. Body mass index is 33.08 kg/m.  General appearance: alert, no distress, WD/WN,  female HEENT: normocephalic, sclerae anicteric, TMs pearly, nares patent, no discharge or erythema, pharynx normal Oral cavity: MMM, no lesions Neck: supple, no lymphadenopathy, no thyromegaly, no masses Heart: RRR, normal S1, S2, no murmurs, occ PVC Lungs: CTA bilaterally, no wheezes, rhonchi, or rales Abdomen: +bs, soft, obese, non tender, non distended, no masses, no hepatomegaly, no splenomegaly Musculoskeletal: nontender, no swelling, no obvious deformity Extremities: no edema, no cyanosis, no clubbing Pulses: 2+ symmetric, upper and lower extremities, normal cap refill Neurological: alert, oriented x 3, CN2-12 intact, strength normal upper extremities and lower extremities, sensation decreased bilateral legs to mid shin, DTRs 2+ throughout, no cerebellar signs, gait normal, left arm with good ROM, good sensation, good pulses. Neck with decreased ROM due to pain, and + paraspinus tenderness bilateral, negative tenderness palpation at spinous process.  Psychiatric: normal affect, behavior normal, pleasant   Breast: defer Gyn: defer Rectal: defer   Medicare Attestation I have personally reviewed: The patient's medical and social history Their use of alcohol, tobacco or illicit drugs Their current medications and supplements The patient's functional ability including ADLs,fall risks, home safety risks, cognitive, and hearing and visual impairment Diet and physical activities Evidence for depression or mood disorders  The patient's weight, height, BMI, and visual acuity have been recorded in the chart.  I have made referrals, counseling, and provided education to the patient based on review of the above and I have provided the patient with a written personalized care plan for preventive services.     Vicie Mutters, PA-C   01/03/2017

## 2017-01-03 ENCOUNTER — Encounter: Payer: Self-pay | Admitting: Physician Assistant

## 2017-01-03 ENCOUNTER — Ambulatory Visit (INDEPENDENT_AMBULATORY_CARE_PROVIDER_SITE_OTHER): Payer: Medicare Other | Admitting: Physician Assistant

## 2017-01-03 VITALS — BP 130/72 | HR 83 | Temp 97.5°F | Resp 14 | Ht 65.0 in | Wt 198.8 lb

## 2017-01-03 DIAGNOSIS — E1142 Type 2 diabetes mellitus with diabetic polyneuropathy: Secondary | ICD-10-CM | POA: Diagnosis not present

## 2017-01-03 DIAGNOSIS — I872 Venous insufficiency (chronic) (peripheral): Secondary | ICD-10-CM

## 2017-01-03 DIAGNOSIS — E1122 Type 2 diabetes mellitus with diabetic chronic kidney disease: Secondary | ICD-10-CM

## 2017-01-03 DIAGNOSIS — M5136 Other intervertebral disc degeneration, lumbar region: Secondary | ICD-10-CM

## 2017-01-03 DIAGNOSIS — Z0001 Encounter for general adult medical examination with abnormal findings: Secondary | ICD-10-CM | POA: Diagnosis not present

## 2017-01-03 DIAGNOSIS — I10 Essential (primary) hypertension: Secondary | ICD-10-CM | POA: Diagnosis not present

## 2017-01-03 DIAGNOSIS — Z79899 Other long term (current) drug therapy: Secondary | ICD-10-CM

## 2017-01-03 DIAGNOSIS — R6889 Other general symptoms and signs: Secondary | ICD-10-CM

## 2017-01-03 DIAGNOSIS — E559 Vitamin D deficiency, unspecified: Secondary | ICD-10-CM

## 2017-01-03 DIAGNOSIS — E782 Mixed hyperlipidemia: Secondary | ICD-10-CM

## 2017-01-03 DIAGNOSIS — Z0000 Encounter for general adult medical examination without abnormal findings: Secondary | ICD-10-CM

## 2017-01-03 DIAGNOSIS — N183 Chronic kidney disease, stage 3 unspecified: Secondary | ICD-10-CM

## 2017-01-03 DIAGNOSIS — H3530 Unspecified macular degeneration: Secondary | ICD-10-CM

## 2017-01-03 DIAGNOSIS — E039 Hypothyroidism, unspecified: Secondary | ICD-10-CM

## 2017-01-03 DIAGNOSIS — M4712 Other spondylosis with myelopathy, cervical region: Secondary | ICD-10-CM

## 2017-01-03 LAB — CBC WITH DIFFERENTIAL/PLATELET
BASOS ABS: 84 {cells}/uL (ref 0–200)
Basophils Relative: 1 %
Eosinophils Absolute: 420 cells/uL (ref 15–500)
Eosinophils Relative: 5 %
HEMATOCRIT: 39.5 % (ref 35.0–45.0)
Hemoglobin: 12.7 g/dL (ref 11.7–15.5)
LYMPHS PCT: 26 %
Lymphs Abs: 2184 cells/uL (ref 850–3900)
MCH: 28.2 pg (ref 27.0–33.0)
MCHC: 32.2 g/dL (ref 32.0–36.0)
MCV: 87.8 fL (ref 80.0–100.0)
MONOS PCT: 10 %
MPV: 11 fL (ref 7.5–12.5)
Monocytes Absolute: 840 cells/uL (ref 200–950)
NEUTROS ABS: 4872 {cells}/uL (ref 1500–7800)
NEUTROS PCT: 58 %
PLATELETS: 342 10*3/uL (ref 140–400)
RBC: 4.5 MIL/uL (ref 3.80–5.10)
RDW: 13.5 % (ref 11.0–15.0)
WBC: 8.4 10*3/uL (ref 3.8–10.8)

## 2017-01-03 LAB — HEPATIC FUNCTION PANEL
ALBUMIN: 4 g/dL (ref 3.6–5.1)
ALK PHOS: 49 U/L (ref 33–130)
ALT: 19 U/L (ref 6–29)
AST: 19 U/L (ref 10–35)
BILIRUBIN TOTAL: 0.3 mg/dL (ref 0.2–1.2)
Bilirubin, Direct: 0.1 mg/dL (ref <=0.2)
Indirect Bilirubin: 0.2 mg/dL (ref 0.2–1.2)
Total Protein: 6.5 g/dL (ref 6.1–8.1)

## 2017-01-03 LAB — BASIC METABOLIC PANEL WITH GFR
BUN: 20 mg/dL (ref 7–25)
CHLORIDE: 96 mmol/L — AB (ref 98–110)
CO2: 26 mmol/L (ref 20–31)
CREATININE: 1.03 mg/dL — AB (ref 0.60–0.88)
Calcium: 9.1 mg/dL (ref 8.6–10.4)
GFR, Est African American: 58 mL/min — ABNORMAL LOW (ref >=60)
GFR, Est Non African American: 50 mL/min — ABNORMAL LOW (ref >=60)
GLUCOSE: 301 mg/dL — AB (ref 65–99)
POTASSIUM: 4.6 mmol/L (ref 3.5–5.3)
Sodium: 134 mmol/L — ABNORMAL LOW (ref 135–146)

## 2017-01-03 LAB — LIPID PANEL
CHOL/HDL RATIO: 5 ratio — AB (ref <5.0)
CHOLESTEROL: 199 mg/dL (ref <200)
HDL: 40 mg/dL — ABNORMAL LOW (ref >50)
LDL Cholesterol: 99 mg/dL (ref <100)
TRIGLYCERIDES: 299 mg/dL — AB (ref <150)
VLDL: 60 mg/dL — ABNORMAL HIGH (ref <30)

## 2017-01-03 NOTE — Patient Instructions (Addendum)
For your sugar write down if you have eaten or not   Your A1C is a measure of your sugar over the past 3 months and is not affected by what you have eaten over the past few days. Diabetes increases your chances of stroke and heart attack over 300 % and is the leading cause of blindness and kidney failure in the [place]. Please make sure you decrease bad carbs like white bread, white rice, potatoes, corn, soft drinks, pasta, cereals, refined sugars, sweet tea, dried fruits, and fruit juice. Good carbs are okay to eat in moderation like sweet potatoes, brown rice, whole grain pasta/bread, most fruit (except dried fruit) and you can eat as many veggies as you want.   Greater than 6.5 is considered diabetic. Between 6.4 and 5.7 is prediabetic If your A1C is less than 5.7 you are NOT diabetic.  Targets for Glucose Readings: Time of Check Target for patients WITHOUT Diabetes Target for DIABETICS  Before Meals Less than 100  less than 150  Two hours after meals Less than 200  Less than 250   Try to NOT take your magnesium, calcium, and zantac with the thyroid pill, take those pills at night and the thyroid in the morning  Take 1 hour before food     Bad carbs also include fruit juice, alcohol, and sweet tea. These are empty calories that do not signal to your brain that you are full.   Please remember the good carbs are still carbs which convert into sugar. So please measure them out no more than 1/2-1 cup of rice, oatmeal, pasta, and beans  Veggies are however free foods! Pile them on.   Not all fruit is created equal. Please see the list below, the fruit at the bottom is higher in sugars than the fruit at the top. Please avoid all dried fruits.

## 2017-01-04 LAB — HEMOGLOBIN A1C
Hgb A1c MFr Bld: 9.5 % — ABNORMAL HIGH (ref <5.7)
Mean Plasma Glucose: 226 mg/dL

## 2017-01-04 LAB — MAGNESIUM: Magnesium: 1.6 mg/dL (ref 1.5–2.5)

## 2017-01-04 LAB — VITAMIN B12: Vitamin B-12: >2000 pg/mL — ABNORMAL HIGH (ref 200–1100)

## 2017-01-04 LAB — TSH: TSH: 2.8 mIU/L

## 2017-01-06 NOTE — Progress Notes (Signed)
LVM for pt to return office call for LAB results.

## 2017-01-14 NOTE — Progress Notes (Signed)
Pt's caregiver was made aware of lab results & voiced understanding of those results.

## 2017-01-17 ENCOUNTER — Other Ambulatory Visit: Payer: Self-pay | Admitting: Internal Medicine

## 2017-01-22 ENCOUNTER — Other Ambulatory Visit: Payer: Self-pay | Admitting: *Deleted

## 2017-01-22 MED ORDER — INSULIN LISPRO PROT & LISPRO (75-25 MIX) 100 UNIT/ML KWIKPEN: PEN_INJECTOR | SUBCUTANEOUS | 1 refills | Status: DC

## 2017-01-23 DIAGNOSIS — M48062 Spinal stenosis, lumbar region with neurogenic claudication: Secondary | ICD-10-CM | POA: Diagnosis not present

## 2017-01-23 DIAGNOSIS — M5416 Radiculopathy, lumbar region: Secondary | ICD-10-CM | POA: Diagnosis not present

## 2017-01-23 DIAGNOSIS — Z79891 Long term (current) use of opiate analgesic: Secondary | ICD-10-CM | POA: Diagnosis not present

## 2017-01-28 DIAGNOSIS — H43812 Vitreous degeneration, left eye: Secondary | ICD-10-CM | POA: Diagnosis not present

## 2017-01-28 DIAGNOSIS — H35341 Macular cyst, hole, or pseudohole, right eye: Secondary | ICD-10-CM | POA: Diagnosis not present

## 2017-01-28 DIAGNOSIS — H353222 Exudative age-related macular degeneration, left eye, with inactive choroidal neovascularization: Secondary | ICD-10-CM | POA: Diagnosis not present

## 2017-01-28 DIAGNOSIS — H353134 Nonexudative age-related macular degeneration, bilateral, advanced atrophic with subfoveal involvement: Secondary | ICD-10-CM | POA: Diagnosis not present

## 2017-02-10 DIAGNOSIS — D1801 Hemangioma of skin and subcutaneous tissue: Secondary | ICD-10-CM | POA: Diagnosis not present

## 2017-02-10 DIAGNOSIS — L570 Actinic keratosis: Secondary | ICD-10-CM | POA: Diagnosis not present

## 2017-02-10 DIAGNOSIS — L814 Other melanin hyperpigmentation: Secondary | ICD-10-CM | POA: Diagnosis not present

## 2017-02-10 DIAGNOSIS — D485 Neoplasm of uncertain behavior of skin: Secondary | ICD-10-CM | POA: Diagnosis not present

## 2017-02-10 DIAGNOSIS — L821 Other seborrheic keratosis: Secondary | ICD-10-CM | POA: Diagnosis not present

## 2017-02-11 DIAGNOSIS — M48062 Spinal stenosis, lumbar region with neurogenic claudication: Secondary | ICD-10-CM | POA: Diagnosis not present

## 2017-02-25 DIAGNOSIS — E1142 Type 2 diabetes mellitus with diabetic polyneuropathy: Secondary | ICD-10-CM | POA: Diagnosis not present

## 2017-02-25 DIAGNOSIS — B351 Tinea unguium: Secondary | ICD-10-CM | POA: Diagnosis not present

## 2017-02-25 DIAGNOSIS — M79676 Pain in unspecified toe(s): Secondary | ICD-10-CM | POA: Diagnosis not present

## 2017-02-25 DIAGNOSIS — L84 Corns and callosities: Secondary | ICD-10-CM | POA: Diagnosis not present

## 2017-03-10 ENCOUNTER — Other Ambulatory Visit: Payer: Self-pay | Admitting: Physician Assistant

## 2017-03-10 ENCOUNTER — Other Ambulatory Visit: Payer: Self-pay | Admitting: Internal Medicine

## 2017-03-17 ENCOUNTER — Other Ambulatory Visit: Payer: Self-pay | Admitting: Physician Assistant

## 2017-03-25 DIAGNOSIS — H353212 Exudative age-related macular degeneration, right eye, with inactive choroidal neovascularization: Secondary | ICD-10-CM | POA: Diagnosis not present

## 2017-03-25 DIAGNOSIS — H353222 Exudative age-related macular degeneration, left eye, with inactive choroidal neovascularization: Secondary | ICD-10-CM | POA: Diagnosis not present

## 2017-03-25 DIAGNOSIS — H353134 Nonexudative age-related macular degeneration, bilateral, advanced atrophic with subfoveal involvement: Secondary | ICD-10-CM | POA: Diagnosis not present

## 2017-03-25 DIAGNOSIS — H35341 Macular cyst, hole, or pseudohole, right eye: Secondary | ICD-10-CM | POA: Diagnosis not present

## 2017-03-25 DIAGNOSIS — H04123 Dry eye syndrome of bilateral lacrimal glands: Secondary | ICD-10-CM | POA: Diagnosis not present

## 2017-03-28 ENCOUNTER — Other Ambulatory Visit: Payer: Self-pay | Admitting: Internal Medicine

## 2017-04-23 ENCOUNTER — Other Ambulatory Visit: Payer: Self-pay | Admitting: Internal Medicine

## 2017-04-24 ENCOUNTER — Ambulatory Visit (INDEPENDENT_AMBULATORY_CARE_PROVIDER_SITE_OTHER): Payer: Medicare Other | Admitting: Internal Medicine

## 2017-04-24 ENCOUNTER — Encounter: Payer: Self-pay | Admitting: Internal Medicine

## 2017-04-24 VITALS — BP 144/76 | HR 72 | Temp 97.5°F | Resp 18 | Ht 64.0 in | Wt 200.0 lb

## 2017-04-24 DIAGNOSIS — E782 Mixed hyperlipidemia: Secondary | ICD-10-CM | POA: Diagnosis not present

## 2017-04-24 DIAGNOSIS — K219 Gastro-esophageal reflux disease without esophagitis: Secondary | ICD-10-CM

## 2017-04-24 DIAGNOSIS — E039 Hypothyroidism, unspecified: Secondary | ICD-10-CM | POA: Diagnosis not present

## 2017-04-24 DIAGNOSIS — N183 Chronic kidney disease, stage 3 (moderate): Secondary | ICD-10-CM

## 2017-04-24 DIAGNOSIS — Z794 Long term (current) use of insulin: Secondary | ICD-10-CM | POA: Diagnosis not present

## 2017-04-24 DIAGNOSIS — Z136 Encounter for screening for cardiovascular disorders: Secondary | ICD-10-CM

## 2017-04-24 DIAGNOSIS — Z79899 Other long term (current) drug therapy: Secondary | ICD-10-CM | POA: Diagnosis not present

## 2017-04-24 DIAGNOSIS — E1122 Type 2 diabetes mellitus with diabetic chronic kidney disease: Secondary | ICD-10-CM

## 2017-04-24 DIAGNOSIS — Z1212 Encounter for screening for malignant neoplasm of rectum: Secondary | ICD-10-CM

## 2017-04-24 DIAGNOSIS — I10 Essential (primary) hypertension: Secondary | ICD-10-CM

## 2017-04-24 DIAGNOSIS — Z0001 Encounter for general adult medical examination with abnormal findings: Secondary | ICD-10-CM

## 2017-04-24 DIAGNOSIS — R6889 Other general symptoms and signs: Secondary | ICD-10-CM

## 2017-04-24 DIAGNOSIS — E559 Vitamin D deficiency, unspecified: Secondary | ICD-10-CM

## 2017-04-24 DIAGNOSIS — Z1211 Encounter for screening for malignant neoplasm of colon: Secondary | ICD-10-CM

## 2017-04-24 NOTE — Patient Instructions (Signed)

## 2017-04-24 NOTE — Progress Notes (Signed)
[facility] [name], M.D.      Uvaldo Bristle. Silverio Lay, P.A.-C [facility]                [address] [place], [place] [address] Telephone [phone] Telefax [phone]  Annual Screening/Preventative Visit & Comprehensive Evaluation &  Examination     This very nice 83 y.o. [facility] presents for a Screening/Preventative Visit & comprehensive evaluation and management of multiple medical co-morbidities.  Patient has been followed for HTN, T2_IDDM, HLD and Vitamin D Deficiency.      HTN predates circa 1994. Patient's BP has been controlled at home and patient denies any cardiac symptoms as chest pain, palpitations, shortness of breath, dizziness or ankle swelling. Today's BP is sl elevated at 144/76.      Patient's hyperlipidemia is controlled with diet and medications. Patient denies myalgias or other medication SE's. 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 She reports CBG's are running in the 200-300's at times and last A1c was not at goal. Patient ia ll upset today about being in the "doNut hole" and having a $200 copay for her insulin. : Lab Results  Component Value Date   HGBA1C 9.5 (H) 01/03/2017      Patient has been on Thyroid Replacement since thyroid surgery in the 1970's. Finally, patient has history of Vitamin D Deficiency ("26" in 2013)  and last Vitamin D was still low: Lab Results  Component Value Date   VD25OH 21 10/03/2016   Current Outpatient Prescriptions on File Prior to  Visit  Medication Sig  . ACCU-CHEK AVIVA PLUS test strip CHECK BLOOD SUGER UP TO 3 TIMES A DAY  . aspirin 81 MG tablet Take 81 mg by mouth daily.  . Calcium Carbonate (CALCIUM 600 PO) Take 600 mg by mouth daily.  . Cholecalciferol (VITAMIN D3) 5000 UNITS CAPS Take 5,000 Units by mouth daily.   . cyclobenzaprine (FLEXERIL) 10 MG tablet Take 1 tablet (10 mg total) by mouth 2 (two) times daily as needed for muscle spasms.  . diclofenac sodium (VOLTAREN) 1 % GEL Apply 4 g topically 4 (four) times daily.  . fenofibrate 160 MG tablet TAKE 1 TABLET DAILY  . furosemide (LASIX) 40 MG tablet TAKE 1 OR 2 TABLETS DAILY AS NEEDED FOR SWELLING/EDEMA  . gabapentin (NEURONTIN) 300 MG capsule 1 CAPSULE EACH MORNING, 2 EACH AFTERNOON, AND 2 AT BEDTIME  . HUMALOG MIX 75/25 KWIKPEN (75-25) 100 UNIT/ML Kwikpen INJECT 30 TO 50 UNITS 2 TIMES A DAY AS DIRECTED  . Lancets (ACCU-CHEK SOFT TOUCH) lancets Check blood sugar 3 times daily-[id number].  . levothyroxine (SYNTHROID, LEVOTHROID) 100 MCG tablet TAKE (1) TABLET DAILY BE- FORE BREAKFAST.  Marland Kitchen losartan (COZAAR) 100 MG tablet TAKE 1 TABLET ONCE A DAY FOR HIGH BLOOD PRESSURE  . Magnesium 400 MG CAPS Take by mouth 4 (four) times daily.  Marland Kitchen MEGARED OMEGA-3 KRILL OIL 500 MG CAPS Take 500 mg by mouth 2 (two) times  daily.  . metFORMIN (GLUCOPHAGE) 1000 MG tablet TAKE (1) TABLET TWICE A DAY WITH MEALS (BREAKFAST AND SUPPER)  . Multiple Vitamins-Minerals (ICAPS PO) Take by mouth daily.  Marland Kitchen OVER THE COUNTER MEDICATION as needed. Macular Degeneration refresh eye drops  . pravastatin (PRAVACHOL) 40 MG tablet TAKE 1 TABLET DAILY FOR CHOLESTEROL  . ranitidine (ZANTAC) 300 MG tablet TAKE 1 TABLET AT BEDTIME FOR ACID REFLUX AND HEART BURN  . ULTICARE SHORT PEN NEEDLES 31G X 8 MM MISC USE 4 TIMES A DAY   No current facility-administered medications on file prior to visit.    Allergies  Allergen Reactions  . Detrol [Tolterodine]    Past Medical History:  Diagnosis Date  .  Arthritis   . GERD (gastroesophageal reflux disease)   . HOH (hard of hearing)   . Hyperlipidemia   . Hypertension   . Hypothyroid   . IDDM (insulin dependent diabetes mellitus) ([place])   . Leg swelling   . Macular degeneration   . Shortness of breath dyspnea    with exertion  . Sleep apnea    does not wear CPAP  . Varicose veins   . Wears glasses    Health Maintenance  Topic Date Due  . INFLUENZA VACCINE  04/09/2017  . HEMOGLOBIN A1C  07/05/2017  . OPHTHALMOLOGY EXAM  10/24/2017  . FOOT EXAM  01/03/2018  . TETANUS/TDAP  10/20/2024  . DEXA SCAN  Completed  . PNA vac Low Risk Adult  Completed   Immunization History  Administered Date(s) Administered  . DT 10/20/2014  . Influenza, High Dose Seasonal PF 07/01/2016  . Influenza-Unspecified 06/22/2013, 06/16/2014, 06/27/2014, 07/06/2015  . Pneumococcal Conjugate-13 04/05/2014  . Pneumococcal-Unspecified 09/09/2004  . Td 09/10/2003  . Zoster 09/09/2005   Past Surgical History:  Procedure Laterality Date  . ANTERIOR CERVICAL DECOMP/DISCECTOMY FUSION N/A 11/08/2014   Procedure: Cervical three-four,  Cervical six-seven anterior cervical decompression with fusion plating and bonegraft;  Surgeon: Ashok Pall, MD;  Location: [facility];  Service: Neurosurgery;  Laterality: N/A;  Cervical three-four,  Cervical six-seven anterior cervical decompression with fusion plating and bonegraft  . BIOPSY THYROID     benign thyroid nodule removal  . CATARACT EXTRACTION, BILATERAL    . COLONOSCOPY W/ BIOPSIES AND POLYPECTOMY    . ENDOMETRIAL BIOPSY  2000  . SPINE SURGERY Left  11/08/2014   Left ACDF   . TUBAL LIGATION    . VARICOSE VEIN SURGERY     Family History  Problem Relation Age of Onset  . Cancer Mother        colon  . Cancer Father        GI   Social History  Substance Use Topics  . Smoking status: Never Smoker  . Smokeless tobacco: Never Used  . Alcohol use No    ROS Constitutional: Denies fever, chills, weight  loss/gain, headaches, insomnia,  night sweats, and change in appetite. Does c/o fatigue. Eyes: Denies redness, blurred vision, diplopia, discharge, itchy, watery eyes.  ENT: Denies discharge, congestion, post nasal drip, epistaxis, sore throat, earache, hearing loss, dental pain, Tinnitus, Vertigo, Sinus pain, snoring.  Cardio: Denies chest pain, palpitations, irregular heartbeat, syncope, dyspnea, diaphoresis, orthopnea, PND, claudication, edema Respiratory: denies cough, dyspnea, DOE, pleurisy, hoarseness, laryngitis, wheezing.  Gastrointestinal: Denies dysphagia, heartburn, reflux, water brash, pain, cramps, nausea, vomiting, bloating, diarrhea, constipation, hematemesis, melena, hematochezia, jaundice, hemorrhoids Genitourinary: Denies dysuria, frequency, urgency, nocturia, hesitancy, discharge, hematuria, flank pain Breast: Breast lumps, nipple discharge, bleeding.  Musculoskeletal: Denies arthralgia, myalgia, stiffness,  Jt. Swelling, pain, limp, and strain/sprain. Denies falls. Skin: Denies puritis, rash, hives, warts, acne, eczema, changing in skin lesion Neuro: No weakness, tremor, incoordination, spasms, paresthesia, pain Psychiatric: Denies confusion, memory loss, sensory loss. Denies Depression. Endocrine: Denies change in weight, skin, hair change, nocturia, and paresthesia, diabetic polys, visual blurring, hyper / hypo glycemic episodes.  Heme/Lymph: No excessive bleeding, bruising, enlarged lymph nodes.  Physical Exam  BP (!) 144/76   Pulse 72   Temp (!) 97.5 F (36.4 C)   Resp 18   Ht 5\' 4"  (1.626 m)   Wt 200 lb (90.7 kg)   BMI 34.33 kg/m   General Appearance: Well nourished, well groomed and in no apparent distress.  Eyes: PERRLA, EOMs, conjunctiva no swelling or erythema, normal fundi and vessels. Sinuses: No frontal/maxillary tenderness ENT/Mouth: EACs patent / TMs  nl. Nares clear without erythema, swelling, mucoid exudates. Oral hygiene is good. No erythema,  swelling, or exudate. Tongue normal, non-obstructing. Tonsils not swollen or erythematous. Hearing normal.  Neck: Supple, thyroid normal. No bruits, nodes or JVD. Respiratory: Respiratory effort normal.  BS equal and clear bilateral without rales, rhonci, wheezing or stridor. Cardio: Heart sounds are normal with regular rate and rhythm and no murmurs, rubs or gallops. Peripheral pulses are normal and equal bilaterally without edema. No aortic or femoral bruits. Chest: symmetric with normal excursions and percussion. Breasts: Symmetric, without lumps, nipple discharge, retractions, or fibrocystic changes.  Abdomen: Flat, soft with bowel sounds active. Nontender, no guarding, rebound, hernias, masses, or organomegaly.  Lymphatics: Non tender without lymphadenopathy.  Musculoskeletal: Full ROM all peripheral extremities, joint stability, 5/5 strength, and normal gait. Skin: Warm and dry without rashes, lesions, cyanosis, clubbing or  ecchymosis.  Neuro: Cranial nerves intact, reflexes equal bilaterally. Normal muscle tone, no cerebellar symptoms. Sensation intact.  Pysch: Alert and oriented X 3, normal affect, Insight and Judgment appropriate.   Assessment and Plan  1. Annual Preventative Screening Examination  2. Essential hypertension  - EKG 12-Lead - Urinalysis, Routine w reflex microscopic - Microalbumin / creatinine urine ratio - CBC with Differential/Platelet - BASIC METABOLIC PANEL WITH GFR - Magnesium - TSH  3. Hyperlipidemia, mixed  - EKG 12-Lead - Hepatic function panel - Lipid panel - TSH  4. Type 2 diabetes mellitus with stage 3 chronic kidney disease, with long-term current use of insulin ([place]  - Instructed patient to purchase the less expensive Novolin 70/30 mix at [employer] and increase her dose to 40 u w/Bkfst and 35 u w/Supper - monitor bid glucoses and call report to office in 5-6 days.   - EKG 12-Lead - Microalbumin / creatinine urine ratio - HM DIABETES FOOT  EXAM - LOW EXTREMITY NEUR EXAM DOCUM - BASIC METABOLIC PANEL WITH GFR - Hemoglobin A1c  5. Vitamin D deficiency  - VITAMIN D 25 Hydroxy  6. Hypothyroidism  - TSH  7. Gastroesophageal reflux disease   8. Screening for ischemic heart disease  - EKG 12-Lead  9. Screening for colorectal cancer  - POC Hemoccult Bld/Stl   10. Medication management  - Urinalysis, Routine w reflex microscopic - Microalbumin / creatinine urine ratio - CBC with Differential/Platelet - BASIC METABOLIC PANEL WITH GFR - Hepatic function panel - Magnesium - Lipid panel - TSH - Hemoglobin A1c - VITAMIN D 25 Hydroxy         Patient was counseled in prudent diet to achieve/maintain BMI less than 25 for weight control, BP monitoring, regular exercise and medications. Discussed med's effects and SE's. Screening  labs and tests as requested with regular follow-up as recommended. Over 40 minutes of exam, counseling, chart review and high complex critical decision making was performed.

## 2017-04-25 LAB — BASIC METABOLIC PANEL WITH GFR
BUN/Creatinine Ratio: 23 (calc) — ABNORMAL HIGH (ref 6–22)
BUN: 22 mg/dL (ref 7–25)
CALCIUM: 9 mg/dL (ref 8.6–10.4)
CHLORIDE: 98 mmol/L (ref 98–110)
CO2: 29 mmol/L (ref 20–32)
Creat: 0.97 mg/dL — ABNORMAL HIGH (ref 0.60–0.88)
GFR, EST AFRICAN AMERICAN: 62 mL/min/{1.73_m2} (ref >=60)
GFR, Est Non African American: 54 mL/min/{1.73_m2} — ABNORMAL LOW (ref >=60)
GLUCOSE: 390 mg/dL — AB (ref 65–99)
POTASSIUM: 4.6 mmol/L (ref 3.5–5.3)
Sodium: 136 mmol/L (ref 135–146)

## 2017-04-25 LAB — HEPATIC FUNCTION PANEL
AG Ratio: 1.7 (calc) (ref 1.0–2.5)
ALBUMIN MSPROF: 4 g/dL (ref 3.6–5.1)
ALT: 16 U/L (ref 6–29)
AST: 16 U/L (ref 10–35)
Alkaline phosphatase (APISO): 57 U/L (ref 33–130)
BILIRUBIN DIRECT: 0.1 mg/dL (ref 0.0–0.2)
Globulin: 2.3 g/dL (calc) (ref 1.9–3.7)
Indirect Bilirubin: 0.2 mg/dL (calc) (ref 0.2–1.2)
TOTAL PROTEIN: 6.3 g/dL (ref 6.1–8.1)
Total Bilirubin: 0.3 mg/dL (ref 0.2–1.2)

## 2017-04-25 LAB — MAGNESIUM: MAGNESIUM: 1.7 mg/dL (ref 1.5–2.5)

## 2017-04-25 LAB — URINALYSIS, ROUTINE W REFLEX MICROSCOPIC
BACTERIA UA: NONE SEEN /HPF
Bilirubin Urine: NEGATIVE
HGB URINE DIPSTICK: NEGATIVE
HYALINE CAST: NONE SEEN /LPF
Ketones, ur: NEGATIVE
Leukocytes, UA: NEGATIVE
Nitrite: NEGATIVE
RBC / HPF: NONE SEEN /HPF (ref 0–2)
SPECIFIC GRAVITY, URINE: 1.022 (ref 1.001–1.03)
pH: 7 (ref 5.0–8.0)

## 2017-04-25 LAB — VITAMIN D 25 HYDROXY (VIT D DEFICIENCY, FRACTURES): Vit D, 25-Hydroxy: 30 ng/mL (ref 30–100)

## 2017-04-25 LAB — LIPID PANEL
CHOL/HDL RATIO: 5 (calc) — AB (ref <5.0)
CHOLESTEROL: 175 mg/dL (ref <200)
HDL: 35 mg/dL — ABNORMAL LOW (ref >50)
Non-HDL Cholesterol (Calc): 140 mg/dL (calc) — ABNORMAL HIGH (ref <130)
Triglycerides: 407 mg/dL — ABNORMAL HIGH (ref <150)

## 2017-04-25 LAB — MICROALBUMIN / CREATININE URINE RATIO
Creatinine, Urine: 64 mg/dL (ref 20–320)
MICROALB UR: 7.7 mg/dL
MICROALB/CREAT RATIO: 120 ug/mg{creat} — AB (ref <30)

## 2017-04-25 LAB — URINE CULTURE
MICRO NUMBER: 80889116
SPECIMEN QUALITY:: ADEQUATE

## 2017-04-25 LAB — CBC WITH DIFFERENTIAL/PLATELET
BASOS PCT: 0.7 %
Basophils Absolute: 56 cells/uL (ref 0–200)
EOS ABS: 528 {cells}/uL — AB (ref 15–500)
Eosinophils Relative: 6.6 %
HEMATOCRIT: 38.4 % (ref 35.0–45.0)
HEMOGLOBIN: 12.2 g/dL (ref 11.7–15.5)
LYMPHS ABS: 2136 {cells}/uL (ref 850–3900)
MCH: 28 pg (ref 27.0–33.0)
MCHC: 31.8 g/dL — ABNORMAL LOW (ref 32.0–36.0)
MCV: 88.3 fL (ref 80.0–100.0)
MPV: 12.5 fL (ref 7.5–12.5)
Monocytes Relative: 10.7 %
NEUTROS ABS: 4424 {cells}/uL (ref 1500–7800)
Neutrophils Relative %: 55.3 %
PLATELETS: 318 10*3/uL (ref 140–400)
RBC: 4.35 10*6/uL (ref 3.80–5.10)
RDW: 12.4 % (ref 11.0–15.0)
TOTAL LYMPHOCYTE: 26.7 %
WBC: 8 10*3/uL (ref 3.8–10.8)
WBCMIX: 856 {cells}/uL (ref 200–950)

## 2017-04-25 LAB — HEMOGLOBIN A1C
HEMOGLOBIN A1C: 10.4 %{Hb} — AB (ref <5.7)
MEAN PLASMA GLUCOSE: 252 (calc)
eAG (mmol/L): 13.9 (calc)

## 2017-04-25 LAB — TSH: TSH: 1.52 mIU/L (ref 0.40–4.50)

## 2017-04-29 DIAGNOSIS — B351 Tinea unguium: Secondary | ICD-10-CM | POA: Diagnosis not present

## 2017-04-29 DIAGNOSIS — M79676 Pain in unspecified toe(s): Secondary | ICD-10-CM | POA: Diagnosis not present

## 2017-04-29 DIAGNOSIS — E1142 Type 2 diabetes mellitus with diabetic polyneuropathy: Secondary | ICD-10-CM | POA: Diagnosis not present

## 2017-04-29 DIAGNOSIS — L84 Corns and callosities: Secondary | ICD-10-CM | POA: Diagnosis not present

## 2017-05-19 ENCOUNTER — Other Ambulatory Visit: Payer: Self-pay | Admitting: Internal Medicine

## 2017-05-24 ENCOUNTER — Other Ambulatory Visit: Payer: Self-pay | Admitting: Internal Medicine

## 2017-06-04 ENCOUNTER — Other Ambulatory Visit: Payer: Self-pay | Admitting: Internal Medicine

## 2017-06-12 ENCOUNTER — Other Ambulatory Visit: Payer: Self-pay | Admitting: Internal Medicine

## 2017-06-16 DIAGNOSIS — Z961 Presence of intraocular lens: Secondary | ICD-10-CM | POA: Diagnosis not present

## 2017-06-16 DIAGNOSIS — E119 Type 2 diabetes mellitus without complications: Secondary | ICD-10-CM | POA: Diagnosis not present

## 2017-06-16 DIAGNOSIS — H353212 Exudative age-related macular degeneration, right eye, with inactive choroidal neovascularization: Secondary | ICD-10-CM | POA: Diagnosis not present

## 2017-06-16 DIAGNOSIS — H353221 Exudative age-related macular degeneration, left eye, with active choroidal neovascularization: Secondary | ICD-10-CM | POA: Diagnosis not present

## 2017-06-16 LAB — HM DIABETES EYE EXAM

## 2017-06-17 ENCOUNTER — Other Ambulatory Visit: Payer: Self-pay | Admitting: Internal Medicine

## 2017-06-23 DIAGNOSIS — H353221 Exudative age-related macular degeneration, left eye, with active choroidal neovascularization: Secondary | ICD-10-CM | POA: Diagnosis not present

## 2017-06-23 DIAGNOSIS — H353212 Exudative age-related macular degeneration, right eye, with inactive choroidal neovascularization: Secondary | ICD-10-CM | POA: Diagnosis not present

## 2017-06-23 DIAGNOSIS — H35341 Macular cyst, hole, or pseudohole, right eye: Secondary | ICD-10-CM | POA: Diagnosis not present

## 2017-06-23 DIAGNOSIS — H04123 Dry eye syndrome of bilateral lacrimal glands: Secondary | ICD-10-CM | POA: Diagnosis not present

## 2017-06-23 DIAGNOSIS — H3562 Retinal hemorrhage, left eye: Secondary | ICD-10-CM | POA: Diagnosis not present

## 2017-06-24 ENCOUNTER — Other Ambulatory Visit: Payer: Self-pay | Admitting: Internal Medicine

## 2017-06-25 ENCOUNTER — Encounter: Payer: Self-pay | Admitting: Internal Medicine

## 2017-06-25 ENCOUNTER — Other Ambulatory Visit: Payer: Self-pay

## 2017-06-25 DIAGNOSIS — Z1212 Encounter for screening for malignant neoplasm of rectum: Principal | ICD-10-CM

## 2017-06-25 DIAGNOSIS — Z1211 Encounter for screening for malignant neoplasm of colon: Secondary | ICD-10-CM

## 2017-06-25 LAB — POC HEMOCCULT BLD/STL (HOME/3-CARD/SCREEN)
Card #3 Fecal Occult Blood, POC: NEGATIVE
FECAL OCCULT BLD: NEGATIVE
Fecal Occult Blood, POC: NEGATIVE

## 2017-07-03 DIAGNOSIS — L84 Corns and callosities: Secondary | ICD-10-CM | POA: Diagnosis not present

## 2017-07-03 DIAGNOSIS — E1142 Type 2 diabetes mellitus with diabetic polyneuropathy: Secondary | ICD-10-CM | POA: Diagnosis not present

## 2017-07-03 DIAGNOSIS — B351 Tinea unguium: Secondary | ICD-10-CM | POA: Diagnosis not present

## 2017-07-03 DIAGNOSIS — M79676 Pain in unspecified toe(s): Secondary | ICD-10-CM | POA: Diagnosis not present

## 2017-07-21 DIAGNOSIS — H353134 Nonexudative age-related macular degeneration, bilateral, advanced atrophic with subfoveal involvement: Secondary | ICD-10-CM | POA: Diagnosis not present

## 2017-07-21 DIAGNOSIS — H353221 Exudative age-related macular degeneration, left eye, with active choroidal neovascularization: Secondary | ICD-10-CM | POA: Diagnosis not present

## 2017-07-21 DIAGNOSIS — H35341 Macular cyst, hole, or pseudohole, right eye: Secondary | ICD-10-CM | POA: Diagnosis not present

## 2017-07-21 DIAGNOSIS — H04123 Dry eye syndrome of bilateral lacrimal glands: Secondary | ICD-10-CM | POA: Diagnosis not present

## 2017-07-21 DIAGNOSIS — H3562 Retinal hemorrhage, left eye: Secondary | ICD-10-CM | POA: Diagnosis not present

## 2017-08-12 DIAGNOSIS — L570 Actinic keratosis: Secondary | ICD-10-CM | POA: Diagnosis not present

## 2017-08-12 DIAGNOSIS — L820 Inflamed seborrheic keratosis: Secondary | ICD-10-CM | POA: Diagnosis not present

## 2017-08-19 ENCOUNTER — Other Ambulatory Visit: Payer: Self-pay | Admitting: Internal Medicine

## 2017-08-21 DIAGNOSIS — E1165 Type 2 diabetes mellitus with hyperglycemia: Secondary | ICD-10-CM | POA: Insufficient documentation

## 2017-08-21 NOTE — Progress Notes (Signed)
FOLLOW UP  Assessment and Plan:   Hypertension Elevated today in office; has not been checking BP at home - will have her check blood pressures daily and present with chart at next visit; would like to postpone adding another agent due to age Monitor blood pressure at home; patient to call if consistently greater than 140/80 Continue DASH diet.   Reminder to go to the ER if any CP, SOB, nausea, dizziness, severe HA, changes vision/speech, left arm numbness and tingling and jaw pain.  Cholesterol Continue medications; discussed at length that we cannot out medicate patient's dietary choices Continue to emphasize low cholesterol diet and exercise.  Check lipid panel.   Uncontrolled type 2 diabetes mellitus with hyperglycemia (HCC) Continue medications - metformin, novolog increased at last visit to 40 units AM, 30 units PM Continue to emphasize diet and exercise.  Perform daily foot/skin check, notify office of any concerning changes.  Check A1C - pending results may follow up with 4 weeks rather than 3 months  Obesity with co morbidities Long discussion about weight loss, diet, and exercise Recommended diet heavy in fruits and veggies and low in animal meats, cheeses, and dairy products, processed foods and simple sugars, appropriate calorie intake Discussed appropriate weight  Follow up at next visit  Hypothyroidism continue medications the same reminded to take on an empty stomach 30-48mins before food.  check TSH level  Vitamin D Def/ osteoporosis prevention Continue supplementation Check Vit D level  CKD stage 3 due to type 2 diabetes mellitus (HCC) Increase fluids, avoid NSAIDS, emphasized crucial need for improved sugars, will monitor  Compliance with medication regimen Questionable mild dementia; possible difficulty with compliance with medications. If poor control of medical conditions continues, may need to consider home health nurse for medication management.    Continue diet and meds as discussed. Further disposition pending results of labs. Discussed med's effects and SE's.   Over 30 minutes of exam, counseling, chart review, and critical decision making was performed.   Future Appointments  Date Time Provider [place]  10/31/2017 11:30 AM [name], MD [facility] None  05/29/2018 10:00 AM [name], MD [facility] None    ----------------------------------------------------------------------------------------------------------------------  HPI 83 y.o. female  presents for 3 month follow up on hypertension, cholesterol, T2DM, hypothyroid,obesity and vitamin D deficiency. Her DM is treated by metformin and novolog insulin; A1C was noted to be 10.4 at last check - insulin was increased to 40 units AM and 30 units PM. She has not presented with a sugar log today - reports fasting AM values 129-180, cannot recall highest and lowest values post-prandial - "really varies with how I'm doing with my diet." Questionable compliance with medications - concerned about possible early dementia; brain MRI 2016 reviewed demonstrating mild cerebral atrophy and likely chronic ischemic changes. Patient denies acute neurological symptoms, weakness, changes in memory. She currently lives at home alone - her husband passed away last year for whom she was the primary cargiver - she is driven by her son and daughter who live in town.   She continues to follow with Dr. Nelva Bush for pain management and back injections.   BMI is Body mass index is 34.33 kg/m., she has been working on diet but has not been exercising due to chronic pain. Wt Readings from Last 3 Encounters:  08/22/17 200 lb (90.7 kg)  04/24/17 200 lb (90.7 kg)  01/03/17 198 lb 12.8 oz (90.2 kg)   She has not been checking her blood pressure at home, today  their BP is BP: (!) 156/64  She does not workout. She denies chest pain, shortness of breath, dizziness.   She is on cholesterol  medication and denies myalgias. Her cholesterol is not at goal. The cholesterol last visit was:   Lab Results  Component Value Date   CHOL 175 04/24/2017   HDL 35 (L) 04/24/2017   LDLCALC 99 01/03/2017   TRIG 407 (H) 04/24/2017   CHOLHDL 5.0 (H) 04/24/2017    She has been working on diet but not exercise for Type 2 diabetes mellitus treated by metformin and , and denies hypoglycemia , increased appetite, nausea, polydipsia, polyuria and visual disturbances. She does endorse numbness/tingling of bilateral lower extremities. Last A1C in the office was:  Lab Results  Component Value Date   HGBA1C 10.4 (H) 04/24/2017   She is on thyroid medication. Her medication was not changed last visit.   Lab Results  Component Value Date   TSH 1.52 04/24/2017   Patient is on Vitamin D supplement but was very low at the last visit:    Lab Results  Component Value Date   VD25OH 30 04/24/2017       Current Medications:  Current Outpatient Medications on File Prior to Visit  Medication Sig  . ACCU-CHEK SOFTCLIX LANCETS lancets CHECK BLOOD SUGER UP TO 3 TIMES A DAY  . aspirin 81 MG tablet Take 81 mg by mouth daily.  . Calcium Carbonate (CALCIUM 600 PO) Take 600 mg by mouth daily.  . Cholecalciferol (VITAMIN D3) 5000 UNITS CAPS Take 5,000 Units by mouth daily.   . cyclobenzaprine (FLEXERIL) 10 MG tablet Take 1 tablet (10 mg total) by mouth 2 (two) times daily as needed for muscle spasms.  . diclofenac sodium (VOLTAREN) 1 % GEL Apply 4 g topically 4 (four) times daily.  . fenofibrate 160 MG tablet TAKE 1 TABLET DAILY  . furosemide (LASIX) 40 MG tablet TAKE 1 OR 2 TABLETS DAILY AS NEEDED FOR SWELLING/EDEMA  . gabapentin (NEURONTIN) 300 MG capsule 1 CAPSULE EACH MORNING, 2 EACH AFTERNOON, AND 2 AT BEDTIME  . insulin NPH-regular Human (NOVOLIN 70/30) (70-30) 100 UNIT/ML injection Inject into the skin 2 (two) times daily with a meal. 40 units AM, 30 PM  . levothyroxine (SYNTHROID, LEVOTHROID) 100 MCG  tablet TAKE (1) TABLET DAILY BE- FORE BREAKFAST.  Marland Kitchen losartan (COZAAR) 100 MG tablet TAKE 1 TABLET ONCE A DAY FOR HIGH BLOOD PRESSURE  . Magnesium 400 MG CAPS Take by mouth 4 (four) times daily.  Marland Kitchen MEGARED OMEGA-3 KRILL OIL 500 MG CAPS Take 500 mg by mouth 2 (two) times daily.  . metFORMIN (GLUCOPHAGE) 1000 MG tablet TAKE (1) TABLET TWICE A DAY WITH MEALS (BREAKFAST AND SUPPER)  . Multiple Vitamins-Minerals (ICAPS PO) Take by mouth daily.  Marland Kitchen OVER THE COUNTER MEDICATION as needed. Macular Degeneration refresh eye drops  . pravastatin (PRAVACHOL) 40 MG tablet TAKE 1 TABLET DAILY FOR CHOLESTEROL  . ranitidine (ZANTAC) 300 MG tablet TAKE 1 TABLET AT BEDTIME FOR ACID REFLUX AND HEART BURN  . Insulin Pen Needle (PEN NEEDLES 31GX5/16") 31G X 8 MM MISC USE 4 TIMES A DAY (Patient not taking: Reported on 08/22/2017)   No current facility-administered medications on file prior to visit.      Allergies:  Allergies  Allergen Reactions  . Detrol [Tolterodine]      Medical History:  Past Medical History:  Diagnosis Date  . Arthritis   . GERD (gastroesophageal reflux disease)   . HOH (hard of hearing)   .  Hyperlipidemia   . Hypertension   . Hypothyroid   . IDDM (insulin dependent diabetes mellitus) ([place])   . Leg swelling   . Macular degeneration   . Shortness of breath dyspnea    with exertion  . Sleep apnea    does not wear CPAP  . Varicose veins   . Wears glasses    Family history- Reviewed and unchanged Social history- Reviewed and unchanged   Review of Systems:  Review of Systems  Constitutional: Negative for malaise/fatigue and weight loss.  HENT: Negative for hearing loss and tinnitus.   Eyes: Negative for blurred vision and double vision.  Respiratory: Positive for shortness of breath (Mild, with exertion, at baseline). Negative for cough and wheezing.   Cardiovascular: Negative for chest pain, palpitations, orthopnea, claudication and leg swelling.  Gastrointestinal:  Negative for abdominal pain, blood in stool, constipation, diarrhea, heartburn, melena, nausea and vomiting.  Genitourinary: Negative.   Musculoskeletal: Positive for joint pain. Negative for myalgias.  Skin: Negative for rash.  Neurological: Positive for tingling. Negative for dizziness, sensory change, weakness and headaches.  Endo/Heme/Allergies: Negative for polydipsia.  Psychiatric/Behavioral: Negative.   All other systems reviewed and are negative.   Physical Exam: BP (!) 156/64   Pulse 86   Temp (!) 97.2 F (36.2 C)   Ht 5\' 4"  (1.626 m)   Wt 200 lb (90.7 kg)   SpO2 93%   BMI 34.33 kg/m  Wt Readings from Last 3 Encounters:  08/22/17 200 lb (90.7 kg)  04/24/17 200 lb (90.7 kg)  01/03/17 198 lb 12.8 oz (90.2 kg)   General Appearance: Well nourished, obese, in no apparent distress. Eyes: PERRLA, EOMs, conjunctiva no swelling or erythema Sinuses: No Frontal/maxillary tenderness ENT/Mouth: Ext aud canals clear, TMs without erythema, bulging. No erythema, swelling, or exudate on post pharynx.  Tonsils not swollen or erythematous. Hearing normal.  Neck: Supple, thyroid normal.  Respiratory: Respiratory effort normal, BS equal bilaterally without rales, rhonchi, wheezing or stridor.  Cardio: RRR with no MRGs. She has nonpitting edema of bilateral ankles - per patient at baseline for her.  Abdomen: Soft, + BS.  Non tender, no guarding, rebound, no palpable hernias, masses. Lymphatics: Non tender without lymphadenopathy.  Musculoskeletal:  symmetrical strength, slow gait with cane Skin: Warm, dry without rashes, lesions, ecchymosis.  Neuro: Cranial nerves intact. No cerebellar symptoms.  Psych: Awake and oriented X 3, normal affect, Insight and Judgment appropriate.    Izora Ribas, NP 12:20 PM [facility]

## 2017-08-22 ENCOUNTER — Ambulatory Visit (INDEPENDENT_AMBULATORY_CARE_PROVIDER_SITE_OTHER): Payer: Medicare Other | Admitting: Adult Health

## 2017-08-22 ENCOUNTER — Encounter: Payer: Self-pay | Admitting: Adult Health

## 2017-08-22 VITALS — BP 152/68 | HR 86 | Temp 97.2°F | Ht 64.0 in | Wt 200.0 lb

## 2017-08-22 DIAGNOSIS — E1122 Type 2 diabetes mellitus with diabetic chronic kidney disease: Secondary | ICD-10-CM | POA: Diagnosis not present

## 2017-08-22 DIAGNOSIS — E559 Vitamin D deficiency, unspecified: Secondary | ICD-10-CM | POA: Diagnosis not present

## 2017-08-22 DIAGNOSIS — E782 Mixed hyperlipidemia: Secondary | ICD-10-CM | POA: Diagnosis not present

## 2017-08-22 DIAGNOSIS — E1165 Type 2 diabetes mellitus with hyperglycemia: Secondary | ICD-10-CM | POA: Diagnosis not present

## 2017-08-22 DIAGNOSIS — E039 Hypothyroidism, unspecified: Secondary | ICD-10-CM

## 2017-08-22 DIAGNOSIS — I10 Essential (primary) hypertension: Secondary | ICD-10-CM | POA: Diagnosis not present

## 2017-08-22 DIAGNOSIS — N183 Chronic kidney disease, stage 3 (moderate): Secondary | ICD-10-CM | POA: Diagnosis not present

## 2017-08-22 DIAGNOSIS — Z79899 Other long term (current) drug therapy: Secondary | ICD-10-CM | POA: Diagnosis not present

## 2017-08-22 NOTE — Patient Instructions (Signed)
Check your sugars as you have been doing and write them down - start checking blood pressure every day as well. Call the office if they continue to run very high  Watch your diet closely - sugars have been running high- it's very important that we get your A1C number down.   Monitor your blood pressure at home. Go to the ER if any CP, SOB, nausea, dizziness, severe HA, changes vision/speech  Goal BP:  For patients younger than 60: Goal BP < 140/90. For patients 60 and older: Goal BP < 150/90. For patients with diabetes: Goal BP < 140/90. Your most recent BP: BP: (!) 152/68   Take your medications faithfully as instructed. Maintain a healthy weight. Get at least 150 minutes of aerobic exercise per week. Minimize salt intake. Minimize alcohol intake  DASH Eating Plan DASH stands for "Dietary Approaches to Stop Hypertension." The DASH eating plan is a healthy eating plan that has been shown to reduce high blood pressure (hypertension). Additional health benefits may include reducing the risk of type 2 diabetes mellitus, heart disease, and stroke. The DASH eating plan may also help with weight loss. WHAT DO I NEED TO KNOW ABOUT THE DASH EATING PLAN? For the DASH eating plan, you will follow these general guidelines:  Choose foods with a percent daily value for sodium of less than 5% (as listed on the food label).  Use salt-free seasonings or herbs instead of table salt or sea salt.  Check with your health care provider or pharmacist before using salt substitutes.  Eat lower-sodium products, often labeled as "lower sodium" or "no salt added."  Eat fresh foods.  Eat more vegetables, fruits, and low-fat dairy products.  Choose whole grains. Look for the word "whole" as the first word in the ingredient list.  Choose fish and skinless chicken or [place] more often than red meat. Limit fish, poultry, and meat to 6 oz (170 g) each day.  Limit sweets, desserts, sugars, and sugary  drinks.  Choose heart-healthy fats.  Limit cheese to 1 oz (28 g) per day.  Eat more home-cooked food and less restaurant, buffet, and fast food.  Limit fried foods.  Cook foods using methods other than frying.  Limit canned vegetables. If you do use them, rinse them well to decrease the sodium.  When eating at a restaurant, ask that your food be prepared with less salt, or no salt if possible. WHAT FOODS CAN I EAT? Seek help from a dietitian for individual calorie needs. Grains Whole grain or whole wheat bread. Brown rice. Whole grain or whole wheat pasta. Quinoa, bulgur, and whole grain cereals. Low-sodium cereals. Corn or whole wheat flour tortillas. Whole grain cornbread. Whole grain crackers. Low-sodium crackers. Vegetables Fresh or frozen vegetables (raw, steamed, roasted, or grilled). Low-sodium or reduced-sodium tomato and vegetable juices. Low-sodium or reduced-sodium tomato sauce and paste. Low-sodium or reduced-sodium canned vegetables.  Fruits All fresh, canned (in natural juice), or frozen fruits. Meat and Other Protein Products Ground beef (85% or leaner), grass-fed beef, or beef trimmed of fat. Skinless chicken or [place]. Ground chicken or [place]. Pork trimmed of fat. All fish and seafood. Eggs. Dried beans, peas, or lentils. Unsalted nuts and seeds. Unsalted canned beans. Dairy Low-fat dairy products, such as skim or 1% milk, 2% or reduced-fat cheeses, low-fat ricotta or cottage cheese, or plain low-fat yogurt. Low-sodium or reduced-sodium cheeses. Fats and Oils Tub margarines without trans fats. Light or reduced-fat mayonnaise and salad dressings (reduced sodium). Avocado. Safflower, olive,  or canola oils. Natural peanut or almond butter. Other Unsalted popcorn and pretzels. The items listed above may not be a complete list of recommended foods or beverages. Contact your dietitian for more options. WHAT FOODS ARE NOT RECOMMENDED? Grains White bread. White pasta.  White rice. Refined cornbread. Bagels and croissants. Crackers that contain trans fat. Vegetables Creamed or fried vegetables. Vegetables in a cheese sauce. Regular canned vegetables. Regular canned tomato sauce and paste. Regular tomato and vegetable juices. Fruits Dried fruits. Canned fruit in light or heavy syrup. Fruit juice. Meat and Other Protein Products Fatty cuts of meat. Ribs, chicken wings, bacon, sausage, bologna, salami, chitterlings, fatback, hot dogs, bratwurst, and packaged luncheon meats. Salted nuts and seeds. Canned beans with salt. Dairy Whole or 2% milk, cream, half-and-half, and cream cheese. Whole-fat or sweetened yogurt. Full-fat cheeses or blue cheese. Nondairy creamers and whipped toppings. Processed cheese, cheese spreads, or cheese curds. Condiments Onion and garlic salt, seasoned salt, table salt, and sea salt. Canned and packaged gravies. Worcestershire sauce. Tartar sauce. Barbecue sauce. Teriyaki sauce. Soy sauce, including reduced sodium. Steak sauce. Fish sauce. Oyster sauce. Cocktail sauce. Horseradish. Ketchup and mustard. Meat flavorings and tenderizers. Bouillon cubes. Hot sauce. Tabasco sauce. Marinades. Taco seasonings. Relishes. Fats and Oils Butter, stick margarine, lard, shortening, ghee, and bacon fat. Coconut, palm kernel, or palm oils. Regular salad dressings. Other Pickles and olives. Salted popcorn and pretzels. The items listed above may not be a complete list of foods and beverages to avoid. Contact your dietitian for more information. WHERE CAN I FIND MORE INFORMATION? National [employer], [employer], [employer] Institute: travelstabloid.com Document Released: 08/15/2011 Document Revised: 01/10/2014 Document Reviewed: 06/30/2013 [facility] Patient Information 2015 [place], [place]. This information is not intended to replace advice given to you by your health care provider. Make sure you discuss any questions you have with your  health care provider.

## 2017-08-23 LAB — CBC WITH DIFFERENTIAL/PLATELET
BASOS ABS: 41 {cells}/uL (ref 0–200)
Basophils Relative: 0.5 %
EOS ABS: 451 {cells}/uL (ref 15–500)
Eosinophils Relative: 5.5 %
HEMATOCRIT: 36.2 % (ref 35.0–45.0)
HEMOGLOBIN: 12 g/dL (ref 11.7–15.5)
LYMPHS ABS: 1714 {cells}/uL (ref 850–3900)
MCH: 28.7 pg (ref 27.0–33.0)
MCHC: 33.1 g/dL (ref 32.0–36.0)
MCV: 86.6 fL (ref 80.0–100.0)
MPV: 11.8 fL (ref 7.5–12.5)
Monocytes Relative: 10.1 %
NEUTROS ABS: 5166 {cells}/uL (ref 1500–7800)
NEUTROS PCT: 63 %
Platelets: 318 10*3/uL (ref 140–400)
RBC: 4.18 10*6/uL (ref 3.80–5.10)
RDW: 13.1 % (ref 11.0–15.0)
Total Lymphocyte: 20.9 %
WBC mixed population: 828 cells/uL (ref 200–950)
WBC: 8.2 10*3/uL (ref 3.8–10.8)

## 2017-08-23 LAB — BASIC METABOLIC PANEL WITH GFR
BUN / CREAT RATIO: 32 (calc) — AB (ref 6–22)
BUN: 30 mg/dL — ABNORMAL HIGH (ref 7–25)
CO2: 30 mmol/L (ref 20–32)
CREATININE: 0.93 mg/dL — AB (ref 0.60–0.88)
Calcium: 9.3 mg/dL (ref 8.6–10.4)
Chloride: 99 mmol/L (ref 98–110)
GFR, EST AFRICAN AMERICAN: 65 mL/min/{1.73_m2} (ref >=60)
GFR, EST NON AFRICAN AMERICAN: 56 mL/min/{1.73_m2} — AB (ref >=60)
Glucose, Bld: 224 mg/dL — ABNORMAL HIGH (ref 65–99)
POTASSIUM: 4.6 mmol/L (ref 3.5–5.3)
SODIUM: 137 mmol/L (ref 135–146)

## 2017-08-23 LAB — LIPID PANEL
Cholesterol: 173 mg/dL (ref <200)
HDL: 53 mg/dL (ref >50)
LDL Cholesterol (Calc): 95 mg/dL (calc)
NON-HDL CHOLESTEROL (CALC): 120 mg/dL (ref <130)
TRIGLYCERIDES: 151 mg/dL — AB (ref <150)
Total CHOL/HDL Ratio: 3.3 (calc) (ref <5.0)

## 2017-08-23 LAB — HEPATIC FUNCTION PANEL
AG RATIO: 1.6 (calc) (ref 1.0–2.5)
ALKALINE PHOSPHATASE (APISO): 43 U/L (ref 33–130)
ALT: 16 U/L (ref 6–29)
AST: 16 U/L (ref 10–35)
Albumin: 4 g/dL (ref 3.6–5.1)
BILIRUBIN DIRECT: 0 mg/dL (ref 0.0–0.2)
BILIRUBIN INDIRECT: 0.3 mg/dL (ref 0.2–1.2)
BILIRUBIN TOTAL: 0.3 mg/dL (ref 0.2–1.2)
Globulin: 2.5 g/dL (calc) (ref 1.9–3.7)
Total Protein: 6.5 g/dL (ref 6.1–8.1)

## 2017-08-23 LAB — HEMOGLOBIN A1C
EAG (MMOL/L): 10.6 (calc)
Hgb A1c MFr Bld: 8.3 % of total Hgb — ABNORMAL HIGH (ref <5.7)
MEAN PLASMA GLUCOSE: 192 (calc)

## 2017-08-23 LAB — TSH: TSH: 3 m[IU]/L (ref 0.40–4.50)

## 2017-08-23 LAB — VITAMIN D 25 HYDROXY (VIT D DEFICIENCY, FRACTURES): Vit D, 25-Hydroxy: 38 ng/mL (ref 30–100)

## 2017-08-28 DIAGNOSIS — H3562 Retinal hemorrhage, left eye: Secondary | ICD-10-CM | POA: Diagnosis not present

## 2017-08-28 DIAGNOSIS — H353221 Exudative age-related macular degeneration, left eye, with active choroidal neovascularization: Secondary | ICD-10-CM | POA: Diagnosis not present

## 2017-08-28 DIAGNOSIS — H35341 Macular cyst, hole, or pseudohole, right eye: Secondary | ICD-10-CM | POA: Diagnosis not present

## 2017-08-28 DIAGNOSIS — H353134 Nonexudative age-related macular degeneration, bilateral, advanced atrophic with subfoveal involvement: Secondary | ICD-10-CM | POA: Diagnosis not present

## 2017-09-11 DIAGNOSIS — B351 Tinea unguium: Secondary | ICD-10-CM | POA: Diagnosis not present

## 2017-09-11 DIAGNOSIS — L84 Corns and callosities: Secondary | ICD-10-CM | POA: Diagnosis not present

## 2017-09-11 DIAGNOSIS — M79676 Pain in unspecified toe(s): Secondary | ICD-10-CM | POA: Diagnosis not present

## 2017-09-11 DIAGNOSIS — E1142 Type 2 diabetes mellitus with diabetic polyneuropathy: Secondary | ICD-10-CM | POA: Diagnosis not present

## 2017-09-16 DIAGNOSIS — M79605 Pain in left leg: Secondary | ICD-10-CM | POA: Diagnosis not present

## 2017-09-24 ENCOUNTER — Other Ambulatory Visit: Payer: Self-pay | Admitting: Internal Medicine

## 2017-09-24 ENCOUNTER — Other Ambulatory Visit: Payer: Self-pay | Admitting: Physician Assistant

## 2017-10-01 NOTE — Progress Notes (Signed)
Subjective:    Patient ID: Catherine Wood, female    DOB: 07/19/1931, 84 y.o.   MRN: [id number]  HPI 84 y.o. WF with history of DM2, CKD, HTN presents with cough/sinus issues x 1 week, not getting better. Sore throat x 1 day, sinus congestion, teeth pain, productive yellow cough. No fever, chills, SOB, wheezing. She has been taking codeine/prometh syrup, mucinex and cough drops.   Blood pressure (!) 152/64, pulse 90, temperature 97.7 F (36.5 C), height 5\' 4"  (1.626 m), weight 196 lb (88.9 kg), SpO2 93 %.  Medications Current Outpatient Medications on File Prior to Visit  Medication Sig  . ACCU-CHEK SOFTCLIX LANCETS lancets CHECK BLOOD SUGER UP TO 3 TIMES A DAY  . aspirin 81 MG tablet Take 81 mg by mouth daily.  . Calcium Carbonate (CALCIUM 600 PO) Take 600 mg by mouth daily.  . Cholecalciferol (VITAMIN D3) 5000 UNITS CAPS Take 5,000 Units by mouth daily.   . cyclobenzaprine (FLEXERIL) 10 MG tablet Take 1 tablet (10 mg total) by mouth 2 (two) times daily as needed for muscle spasms.  . diclofenac sodium (VOLTAREN) 1 % GEL Apply 4 g topically 4 (four) times daily.  . fenofibrate 160 MG tablet TAKE 1 TABLET DAILY  . furosemide (LASIX) 40 MG tablet TAKE 1 OR 2 TABLETS DAILY AS NEEDED FOR SWELLING/EDEMA  . gabapentin (NEURONTIN) 300 MG capsule 1 CAPSULE EACH MORNING, 2 EACH AFTERNOON, AND 2 AT BEDTIME  . insulin NPH-regular Human (NOVOLIN 70/30) (70-30) 100 UNIT/ML injection Inject into the skin 2 (two) times daily with a meal. 40 units AM, 30 PM  . Insulin Pen Needle (PEN NEEDLES 31GX5/16") 31G X 8 MM MISC USE 4 TIMES A DAY  . levothyroxine (SYNTHROID, LEVOTHROID) 100 MCG tablet TAKE (1) TABLET DAILY BE- FORE BREAKFAST.  Marland Kitchen losartan (COZAAR) 100 MG tablet TAKE 1 TABLET ONCE A DAY FOR HIGH BLOOD PRESSURE  . Magnesium 400 MG CAPS Take by mouth 4 (four) times daily.  Marland Kitchen MEGARED OMEGA-3 KRILL OIL 500 MG CAPS Take 500 mg by mouth 2 (two) times daily.  . metFORMIN (GLUCOPHAGE) 1000 MG tablet TAKE  (1) TABLET TWICE A DAY WITH MEALS (BREAKFAST AND SUPPER)  . Multiple Vitamins-Minerals (ICAPS PO) Take by mouth daily.  Marland Kitchen OVER THE COUNTER MEDICATION as needed. Macular Degeneration refresh eye drops  . oxyCODONE-acetaminophen (PERCOCET) 10-325 MG tablet Take 1 tablet by mouth every 8 (eight) hours as needed for pain.  . pravastatin (PRAVACHOL) 40 MG tablet TAKE 1 TABLET DAILY FOR CHOLESTEROL  . ranitidine (ZANTAC) 300 MG tablet TAKE 1 TABLET AT BEDTIME FOR ACID REFLUX AND HEART BURN   No current facility-administered medications on file prior to visit.     Problem list She has Hypertension; Hypothyroid; CKD stage 3 due to type 2 diabetes mellitus ([place]); Hyperlipidemia; Macular degeneration; Vitamin D deficiency; Medication management; Chronic venous insufficiency; Obesity; DDD (degenerative disc disease), lumbar; CKD (chronic kidney disease) stage 3, GFR 30-59 ml/min ([place]); Cervical spondylosis with myelopathy; Diabetic polyneuropathy associated with type 2 diabetes mellitus ([place]); and Uncontrolled type 2 diabetes mellitus ([place]) on their problem list.   Review of Systems  Constitutional: Negative for chills, fatigue and fever.  HENT: Positive for congestion, rhinorrhea, sinus pressure and sore throat. Negative for dental problem, ear discharge, ear pain, nosebleeds, trouble swallowing and voice change.   Respiratory: Positive for cough. Negative for chest tightness, shortness of breath and wheezing.   Cardiovascular: Negative.   Gastrointestinal: Negative.   Genitourinary: Negative.   Musculoskeletal:  Negative.   Neurological: Negative.        Objective:   Physical Exam  Constitutional: She is oriented to person, place, and time. She appears well-developed and well-nourished.  HENT:  Head: Normocephalic and atraumatic.  Right Ear: External ear normal.  Left Ear: External ear normal.  Nose: Right sinus exhibits maxillary sinus tenderness. Right sinus exhibits no frontal sinus  tenderness. Left sinus exhibits maxillary sinus tenderness. Left sinus exhibits no frontal sinus tenderness.  Mouth/Throat: Oropharynx is clear and moist.  Eyes: Conjunctivae are normal. Pupils are equal, round, and reactive to light.  Neck: Normal range of motion. Neck supple.  Cardiovascular: Normal rate and regular rhythm.  Pulmonary/Chest: Effort normal. No respiratory distress. She has wheezes. She has no rales. She exhibits no tenderness.  Abdominal: Soft. Bowel sounds are normal.  Lymphadenopathy:    She has no cervical adenopathy.  Neurological: She is alert and oriented to person, place, and time.  Skin: Skin is warm and dry.      Assessment & Plan:   Acute non-recurrent maxillary sinusitis Will try to avoid prednisone, do breo samples given, if not better get prednisone but monitor sugar -     azithromycin (ZITHROMAX) 250 MG tablet; Take 2 tablets (500 mg) on  Day 1,  followed by 1 tablet (250 mg) once daily on Days 2 through 5. -     promethazine-codeine (PHENERGAN WITH CODEINE) 6.25-10 MG/5ML syrup; Take 5 mLs by mouth every 6 (six) hours as needed for cough. Max: 71mL per day -     benzonatate (TESSALON PERLES) 100 MG capsule; Take 1 capsule (100 mg total) by mouth 3 (three) times daily as needed for cough (Max: 600mg  per day). -     predniSONE (DELTASONE) 20 MG tablet; 1 tablet daily for 5 days.   The patient was advised to call immediately if she has any concerning symptoms in the interval. The patient voices understanding of current treatment options and is in agreement with the current care plan.The patient knows to call the clinic with any problems, questions or concerns or go to the ER if any further progression of symptoms.

## 2017-10-02 ENCOUNTER — Encounter: Payer: Self-pay | Admitting: Physician Assistant

## 2017-10-02 ENCOUNTER — Ambulatory Visit (INDEPENDENT_AMBULATORY_CARE_PROVIDER_SITE_OTHER): Payer: Medicare Other | Admitting: Physician Assistant

## 2017-10-02 VITALS — BP 152/64 | HR 90 | Temp 97.7°F | Ht 64.0 in | Wt 196.0 lb

## 2017-10-02 DIAGNOSIS — J0100 Acute maxillary sinusitis, unspecified: Secondary | ICD-10-CM | POA: Diagnosis not present

## 2017-10-02 MED ORDER — PROMETHAZINE-CODEINE 6.25-10 MG/5ML PO SYRP: 5.0000 mL | ORAL_SOLUTION | Freq: Four times a day (QID) | ORAL | 0 refills | Status: DC | PRN

## 2017-10-02 MED ORDER — AZITHROMYCIN 250 MG PO TABS: ORAL_TABLET | ORAL | 1 refills | Status: DC

## 2017-10-02 MED ORDER — PREDNISONE 20 MG PO TABS: ORAL_TABLET | ORAL | 0 refills | Status: DC

## 2017-10-02 MED ORDER — BENZONATATE 100 MG PO CAPS: 100.0000 mg | ORAL_CAPSULE | Freq: Three times a day (TID) | ORAL | 0 refills | Status: DC | PRN

## 2017-10-02 NOTE — Patient Instructions (Signed)
Can do steroid inhaler, NEED TO DO DAILY, this is NOT a rescue inhaler so if you are acutely short of breath please use your albuterol or call 911.  Do 1 puff once a day.  Do before you brush your teeth OR wash your mouth afterwards.  IF YOU DO NOT Swink YOUR MOUTH OUT IT CAN CAUSE YEAST Can do 2 tsp vinegar with water and switch to help prevent yeast or help yeast in your mouth.   Go to the ER if any chest pain, shortness of breath, nausea, dizziness, severe HA, changes vision/speech  Take zpak  CAN DO TESSALON DURING THE DAY AND COUGH SYRUP AT NIGHT  If you are not feeling better after 2 days, get on a VERY short course of prednisone.   Make sure you are on an allergy pill, see below for more details. Please take the prednisone as directed below, this is NOT an antibiotic so you do NOT have to finish it. You can take it for a few days and stop it if you are doing better.   Please take the prednisone to help decrease inflammation and therefore decrease symptoms. Take it it with food to avoid GI upset. It can cause increased energy but on the other hand it can make it hard to sleep at night so please take it AT [address] [place], it takes 8-12 hours to start working so it will NOT affect your sleeping if you take it at night with your food!!  If you are diabetic it will increase your sugars so decrease carbs and monitor your sugars closely.    HOW TO TREAT VIRAL COUGH AND COLD SYMPTOMS:  -Symptoms usually last at least 1 week with the worst symptoms being around day 4.  - colds usually start with a sore throat and end with a cough, and the cough can take 2 weeks to get better.  -No antibiotics are needed for colds, flu, sore throats, cough, bronchitis UNLESS symptoms are longer than 7 days OR if you are getting better then get drastically worse.  -There are a lot of combination medications (Dayquil, Nyquil, Vicks 44, tyelnol cold and sinus, ETC). Please look at the ingredients on the back so  that you are treating the correct symptoms and not doubling up on medications/ingredients.    Medicines you can use  Nasal congestion  Little Remedies saline spray (aerosol/mist)- can try this, it is in the kids section - pseudoephedrine (Sudafed)- behind the counter, do not use if you have high blood pressure, medicine that have -D in them.  - phenylephrine (Sudafed PE) -Dextormethorphan + chlorpheniramine (Coridcidin HBP)- okay if you have high blood pressure -Oxymetazoline (Afrin) nasal spray- LIMIT to 3 days -Saline nasal spray -Neti pot (used distilled or bottled water)  Ear pain/congestion  -pseudoephedrine (sudafed) - Nasonex/flonase nasal spray  Fever  -Acetaminophen (Tyelnol) -Ibuprofen (Advil, motrin, aleve)  Sore Throat  -Acetaminophen (Tyelnol) -Ibuprofen (Advil, motrin, aleve) -Drink a lot of water -Gargle with salt water - Rest your voice (don't talk) -Throat sprays -Cough drops  Body Aches  -Acetaminophen (Tyelnol) -Ibuprofen (Advil, motrin, aleve)  Headache  -Acetaminophen (Tyelnol) -Ibuprofen (Advil, motrin, aleve) - Exedrin, Exedrin Migraine  Allergy symptoms (cough, sneeze, runny nose, itchy eyes) -Claritin or loratadine cheapest but likely the weakest  -Zyrtec or certizine at night because it can make you sleepy -The strongest is allegra or fexafinadine  Cheapest at [employer], [employer], [employer]  Cough  -Dextromethorphan (Delsym)- medicine that has DM in it -Guafenesin (Mucinex/Robitussin) -  cough drops - drink lots of water  Chest Congestion  -Guafenesin (Mucinex/Robitussin)  Red Itchy Eyes  - Naphcon-A  Upset Stomach  - Bland diet (nothing spicy, greasy, fried, and high acid foods like tomatoes, oranges, berries) -OKAY- cereal, bread, soup, crackers, rice -Eat smaller more frequent meals -reduce caffeine, no alcohol -Loperamide (Imodium-AD) if diarrhea -Prevacid for heart burn  General health when sick  -Hydration -wash your hands  frequently -keep surfaces clean -change pillow cases and sheets often -Get fresh air but do not exercise strenuously -Vitamin D, double up on it - Vitamin C -Zinc

## 2017-10-09 DIAGNOSIS — H35341 Macular cyst, hole, or pseudohole, right eye: Secondary | ICD-10-CM | POA: Diagnosis not present

## 2017-10-09 DIAGNOSIS — H353221 Exudative age-related macular degeneration, left eye, with active choroidal neovascularization: Secondary | ICD-10-CM | POA: Diagnosis not present

## 2017-10-09 DIAGNOSIS — H3562 Retinal hemorrhage, left eye: Secondary | ICD-10-CM | POA: Diagnosis not present

## 2017-10-09 DIAGNOSIS — H04123 Dry eye syndrome of bilateral lacrimal glands: Secondary | ICD-10-CM | POA: Diagnosis not present

## 2017-10-09 DIAGNOSIS — H353134 Nonexudative age-related macular degeneration, bilateral, advanced atrophic with subfoveal involvement: Secondary | ICD-10-CM | POA: Diagnosis not present

## 2017-10-14 ENCOUNTER — Other Ambulatory Visit: Payer: Self-pay | Admitting: Internal Medicine

## 2017-10-15 ENCOUNTER — Other Ambulatory Visit: Payer: Self-pay | Admitting: Internal Medicine

## 2017-10-20 ENCOUNTER — Other Ambulatory Visit: Payer: Self-pay | Admitting: Physician Assistant

## 2017-10-20 DIAGNOSIS — J0100 Acute maxillary sinusitis, unspecified: Secondary | ICD-10-CM

## 2017-10-31 ENCOUNTER — Ambulatory Visit (INDEPENDENT_AMBULATORY_CARE_PROVIDER_SITE_OTHER): Payer: Medicare Other | Admitting: Internal Medicine

## 2017-10-31 ENCOUNTER — Encounter: Payer: Self-pay | Admitting: Internal Medicine

## 2017-10-31 VITALS — BP 140/80 | HR 68 | Temp 97.6°F | Ht 64.0 in | Wt 200.8 lb

## 2017-10-31 DIAGNOSIS — I10 Essential (primary) hypertension: Secondary | ICD-10-CM

## 2017-10-31 DIAGNOSIS — Z794 Long term (current) use of insulin: Secondary | ICD-10-CM | POA: Diagnosis not present

## 2017-10-31 DIAGNOSIS — E559 Vitamin D deficiency, unspecified: Secondary | ICD-10-CM

## 2017-10-31 DIAGNOSIS — N183 Chronic kidney disease, stage 3 (moderate): Secondary | ICD-10-CM | POA: Diagnosis not present

## 2017-10-31 DIAGNOSIS — Z79899 Other long term (current) drug therapy: Secondary | ICD-10-CM | POA: Diagnosis not present

## 2017-10-31 DIAGNOSIS — E782 Mixed hyperlipidemia: Secondary | ICD-10-CM

## 2017-10-31 DIAGNOSIS — E1122 Type 2 diabetes mellitus with diabetic chronic kidney disease: Secondary | ICD-10-CM

## 2017-10-31 NOTE — Patient Instructions (Signed)

## 2017-10-31 NOTE — Progress Notes (Signed)
This very nice 84 y.o. WWF  presents for 6 month follow up with HTN, HLD, Ins Dep T2_DM and Vitamin D Deficiency.      Patient is treated for HTN (34742) & BP has been controlled at home. Today's BP is borderline elevated at 140/80. Patient has had no complaints of any cardiac type chest pain, palpitations, dyspnea / orthopnea / PND, dizziness, claudication, or dependent edema.     Hyperlipidemia is controlled with diet & meds. Patient denies myalgias or other med SE's. Last Lipids were  Lab Results  Component Value Date   CHOL 173 08/22/2017   HDL 53 08/22/2017   LDLCALC 99 01/03/2017   TRIG 151 (H) 08/22/2017   CHOLHDL 3.3 08/22/2017      Also, the patient has history of  Hx/o compulsive Over eating /Gluttony (BMI 34+) and  has insulin requiring T2_DM (2003) with CKD3 (GFR 40) and has had no symptoms of reactive hypoglycemia, diabetic polys, paresthesias or visual blurring.  She admits glucoses frequently in the 200-300's from poor food choices. Last A1c was not at goal: Lab Results  Component Value Date   HGBA1C 8.3 (H) 08/22/2017      Patient had Goiter surg in the 1970's & has been on replacement therapy since.      Further, the patient also has history of Vitamin D Deficiency ("26"/2013) and supplements vitamin D very sporadically. Last vitamin D was still very low: Lab Results  Component Value Date   VD25OH 38 08/22/2017   Current Outpatient Medications on File Prior to Visit  Medication Sig  . ACCU-CHEK SOFTCLIX LANCETS lancets CHECK BLOOD SUGER UP TO 3 TIMES A DAY  . aspirin 81 MG tablet Take 81 mg by mouth daily.  . Calcium Carbonate (CALCIUM 600 PO) Take 600 mg by mouth daily.  . Cholecalciferol (VITAMIN D3) 5000 UNITS CAPS Take 5,000 Units by mouth daily.   . cyclobenzaprine (FLEXERIL) 10 MG tablet Take 1 tablet (10 mg total) by mouth 2 (two) times daily as needed for muscle spasms.  . diclofenac sodium (VOLTAREN) 1 % GEL Apply 4 g topically 4 (four) times daily.    . fenofibrate 160 MG tablet TAKE 1 TABLET DAILY  . furosemide (LASIX) 40 MG tablet TAKE 1 OR 2 TABLETS DAILY AS NEEDED FOR SWELLING/EDEMA  . gabapentin (NEURONTIN) 300 MG capsule 1 CAPSULE EACH MORNING, 2 EACH AFTERNOON, AND 2 AT BEDTIME  . insulin NPH-regular Human (NOVOLIN 70/30) (70-30) 100 UNIT/ML injection Inject into the skin 2 (two) times daily with a meal. 40 units AM, 30 PM  . Insulin Pen Needle (PEN NEEDLES 31GX5/16") 31G X 8 MM MISC USE 4 TIMES A DAY  . levothyroxine (SYNTHROID, LEVOTHROID) 100 MCG tablet TAKE (1) TABLET DAILY BE- FORE BREAKFAST.  Marland Kitchen losartan (COZAAR) 100 MG tablet TAKE 1 TABLET ONCE A DAY FOR HIGH BLOOD PRESSURE  . Magnesium 400 MG CAPS Take by mouth 4 (four) times daily.  Marland Kitchen MEGARED OMEGA-3 KRILL OIL 500 MG CAPS Take 500 mg by mouth 2 (two) times daily.  . metFORMIN (GLUCOPHAGE) 1000 MG tablet TAKE (1) TABLET TWICE A DAY WITH MEALS (BREAKFAST AND SUPPER)  . Multiple Vitamins-Minerals (ICAPS PO) Take by mouth daily.  Marland Kitchen OVER THE COUNTER MEDICATION as needed. Macular Degeneration refresh eye drops  . oxyCODONE-acetaminophen (PERCOCET) 10-325 MG tablet Take 1 tablet by mouth every 8 (eight) hours as needed for pain.  . pravastatin (PRAVACHOL) 40 MG tablet TAKE 1 TABLET DAILY FOR CHOLESTEROL  .  ranitidine (ZANTAC) 300 MG tablet TAKE 1 TABLET AT BEDTIME FOR ACID REFLUX AND HEART BURN   No current facility-administered medications on file prior to visit.    Allergies  Allergen Reactions  . Detrol [Tolterodine]    PMHx:   Past Medical History:  Diagnosis Date  . Arthritis   . GERD (gastroesophageal reflux disease)   . HOH (hard of hearing)   . Hyperlipidemia   . Hypertension   . Hypothyroid   . IDDM (insulin dependent diabetes mellitus) ([place])   . Leg swelling   . Macular degeneration   . Shortness of breath dyspnea    with exertion  . Sleep apnea    does not wear CPAP  . Varicose veins   . Wears glasses    Immunization History  Administered Date(s)  Administered  . DT 10/20/2014  . Influenza Split 06/04/2017  . Influenza, High Dose Seasonal PF 07/01/2016  . Influenza-Unspecified 06/22/2013, 06/16/2014, 06/27/2014, 07/06/2015  . Pneumococcal Conjugate-13 04/05/2014  . Pneumococcal-Unspecified 09/09/2004  . Td 09/10/2003  . Zoster 09/09/2005   Past Surgical History:  Procedure Laterality Date  . ANTERIOR CERVICAL DECOMP/DISCECTOMY FUSION N/A 11/08/2014   Procedure: Cervical three-four,  Cervical six-seven anterior cervical decompression with fusion plating and bonegraft;  Surgeon: Ashok Pall, MD;  Location: [facility];  Service: Neurosurgery;  Laterality: N/A;  Cervical three-four,  Cervical six-seven anterior cervical decompression with fusion plating and bonegraft  . BIOPSY THYROID     benign thyroid nodule removal  . CATARACT EXTRACTION, BILATERAL    . COLONOSCOPY W/ BIOPSIES AND POLYPECTOMY    . ENDOMETRIAL BIOPSY  2000  . SPINE SURGERY Left  11/08/2014   Left ACDF   . TUBAL LIGATION    . VARICOSE VEIN SURGERY     FHx:    Reviewed / unchanged  SHx:    Reviewed / unchanged  Systems Review:  Constitutional: Denies fever, chills, wt changes, headaches, insomnia, fatigue, night sweats, change in appetite. Eyes: Denies redness, blurred vision, diplopia, discharge, itchy, watery eyes.  ENT: Denies discharge, congestion, post nasal drip, epistaxis, sore throat, earache, hearing loss, dental pain, tinnitus, vertigo, sinus pain, snoring.  CV: Denies chest pain, palpitations, irregular heartbeat, syncope, dyspnea, diaphoresis, orthopnea, PND, claudication or edema. Respiratory: denies cough, dyspnea, DOE, pleurisy, hoarseness, laryngitis, wheezing.  Gastrointestinal: Denies dysphagia, odynophagia, heartburn, reflux, water brash, abdominal pain or cramps, nausea, vomiting, bloating, diarrhea, constipation, hematemesis, melena, hematochezia  or hemorrhoids. Genitourinary: Denies dysuria, frequency, urgency, nocturia, hesitancy,  discharge, hematuria or flank pain. Musculoskeletal: Denies arthralgias, myalgias, stiffness, jt. swelling, pain, limping or strain/sprain.  Skin: Denies pruritus, rash, hives, warts, acne, eczema or change in skin lesion(s). Neuro: No weakness, tremor, incoordination, spasms, paresthesia or pain. Psychiatric: Denies confusion, memory loss or sensory loss. Endo: Denies change in weight, skin or hair change.  Heme/Lymph: No excessive bleeding, bruising or enlarged lymph nodes.  Physical Exam  BP 140/80   Pulse 68   Temp 97.6 F (36.4 C)   Ht 5\' 4"  (1.626 m)   Wt 200 lb 12.8 oz (91.1 kg)   SpO2 98%   BMI 34.47 kg/m   Appears  well nourished, well groomed  and in no distress.  Eyes: PERRLA, EOMs, conjunctiva no swelling or erythema. Sinuses: No frontal/maxillary tenderness ENT/Mouth: EAC's clear, TM's nl w/o erythema, bulging. Nares clear w/o erythema, swelling, exudates. Oropharynx clear without erythema or exudates. Oral hygiene is good. Tongue normal, non obstructing. Hearing intact.  Neck: Supple. Thyroid not palpable. Car 2+/2+ without bruits,  nodes or JVD. Chest: Respirations nl with BS clear & equal w/o rales, rhonchi, wheezing or stridor.  Cor: Heart sounds normal w/ regular rate and rhythm without sig. murmurs, gallops, clicks or rubs. Peripheral pulses normal and equal  without edema.  Abdomen: Soft & bowel sounds normal. Non-tender w/o guarding, rebound, hernias, masses or organomegaly.  Lymphatics: Unremarkable.  Musculoskeletal: Full ROM all peripheral extremities, joint stability, 5/5 strength and normal gait.  Skin: Warm, dry without exposed rashes, lesions or ecchymosis apparent.  Neuro: Cranial nerves intact, reflexes equal bilaterally. Sensory-motor testing grossly intact. Tendon reflexes grossly intact.  Pysch: Alert & oriented x 3.  Insight and judgement nl & appropriate. No ideations.  Assessment and Plan:  1. Essential hypertension  - Continue medication,  monitor blood pressure at home.  - Continue DASH diet. Reminder to go to the ER if any CP,  SOB, nausea, dizziness, severe HA, changes vision/speech.  - CBC with Differential/Platelet - BASIC METABOLIC PANEL WITH GFR - Magnesium - TSH  2. Hyperlipidemia, mixed  - Continue diet/meds, exercise,& lifestyle modifications.  - Continue monitor periodic cholesterol/liver & renal functions   - Hepatic function panel - Lipid panel - TSH  3. Type 2 diabetes mellitus with stage 3 chronic kidney disease, with long-term current use of insulin (HCC)  - Continue diet, exercise, lifestyle modifications.  - Monitor appropriate labs. - Hemoglobin A1c  4. Vitamin D deficiency  - Continue supplementation.  - VITAMIN D 25 Hydroxyl  5. Medication management  - CBC with Differential/Platelet - BASIC METABOLIC PANEL WITH GFR - Hepatic function panel - Magnesium - Lipid panel - TSH - Hemoglobin A1c - VITAMIN D 25 Hydroxyl         Discussed  regular exercise, BP monitoring, weight control to achieve/maintain BMI less than 25 and discussed med and SE's. Recommended labs to assess and monitor clinical status with further disposition pending results of labs. Over 30 minutes of exam, counseling, chart review was performed.

## 2017-11-01 LAB — CBC WITH DIFFERENTIAL/PLATELET
BASOS ABS: 61 {cells}/uL (ref 0–200)
Basophils Relative: 0.7 %
EOS PCT: 5.3 %
Eosinophils Absolute: 461 cells/uL (ref 15–500)
HCT: 39.7 % (ref 35.0–45.0)
Hemoglobin: 12.9 g/dL (ref 11.7–15.5)
Lymphs Abs: 1827 cells/uL (ref 850–3900)
MCH: 28.2 pg (ref 27.0–33.0)
MCHC: 32.5 g/dL (ref 32.0–36.0)
MCV: 86.9 fL (ref 80.0–100.0)
MONOS PCT: 7.6 %
MPV: 11.7 fL (ref 7.5–12.5)
NEUTROS PCT: 65.4 %
Neutro Abs: 5690 cells/uL (ref 1500–7800)
Platelets: 372 10*3/uL (ref 140–400)
RBC: 4.57 10*6/uL (ref 3.80–5.10)
RDW: 12 % (ref 11.0–15.0)
Total Lymphocyte: 21 %
WBC mixed population: 661 cells/uL (ref 200–950)
WBC: 8.7 10*3/uL (ref 3.8–10.8)

## 2017-11-01 LAB — BASIC METABOLIC PANEL WITH GFR
BUN/Creatinine Ratio: 20 (calc) (ref 6–22)
BUN: 21 mg/dL (ref 7–25)
CALCIUM: 9.5 mg/dL (ref 8.6–10.4)
CO2: 31 mmol/L (ref 20–32)
CREATININE: 1.07 mg/dL — AB (ref 0.60–0.88)
Chloride: 97 mmol/L — ABNORMAL LOW (ref 98–110)
GFR, EST AFRICAN AMERICAN: 55 mL/min/{1.73_m2} — AB (ref >=60)
GFR, EST NON AFRICAN AMERICAN: 47 mL/min/{1.73_m2} — AB (ref >=60)
Glucose, Bld: 319 mg/dL — ABNORMAL HIGH (ref 65–99)
Potassium: 4.9 mmol/L (ref 3.5–5.3)
Sodium: 136 mmol/L (ref 135–146)

## 2017-11-01 LAB — LIPID PANEL
Cholesterol: 220 mg/dL — ABNORMAL HIGH (ref <200)
HDL: 47 mg/dL — ABNORMAL LOW (ref >50)
LDL CHOLESTEROL (CALC): 127 mg/dL — AB
NON-HDL CHOLESTEROL (CALC): 173 mg/dL — AB (ref <130)
TRIGLYCERIDES: 320 mg/dL — AB (ref <150)
Total CHOL/HDL Ratio: 4.7 (calc) (ref <5.0)

## 2017-11-01 LAB — HEPATIC FUNCTION PANEL
AG RATIO: 1.5 (calc) (ref 1.0–2.5)
ALBUMIN MSPROF: 4.2 g/dL (ref 3.6–5.1)
ALT: 20 U/L (ref 6–29)
AST: 19 U/L (ref 10–35)
Alkaline phosphatase (APISO): 49 U/L (ref 33–130)
BILIRUBIN DIRECT: 0.1 mg/dL (ref 0.0–0.2)
BILIRUBIN TOTAL: 0.3 mg/dL (ref 0.2–1.2)
GLOBULIN: 2.8 g/dL (ref 1.9–3.7)
Indirect Bilirubin: 0.2 mg/dL (calc) (ref 0.2–1.2)
Total Protein: 7 g/dL (ref 6.1–8.1)

## 2017-11-01 LAB — TSH: TSH: 2.05 mIU/L (ref 0.40–4.50)

## 2017-11-01 LAB — HEMOGLOBIN A1C
HEMOGLOBIN A1C: 9.8 %{Hb} — AB (ref <5.7)
Mean Plasma Glucose: 235 (calc)
eAG (mmol/L): 13 (calc)

## 2017-11-01 LAB — MAGNESIUM: Magnesium: 1.8 mg/dL (ref 1.5–2.5)

## 2017-11-01 LAB — VITAMIN D 25 HYDROXY (VIT D DEFICIENCY, FRACTURES): Vit D, 25-Hydroxy: 40 ng/mL (ref 30–100)

## 2017-11-02 ENCOUNTER — Encounter: Payer: Self-pay | Admitting: Internal Medicine

## 2017-11-20 DIAGNOSIS — E1142 Type 2 diabetes mellitus with diabetic polyneuropathy: Secondary | ICD-10-CM | POA: Diagnosis not present

## 2017-11-20 DIAGNOSIS — B351 Tinea unguium: Secondary | ICD-10-CM | POA: Diagnosis not present

## 2017-11-20 DIAGNOSIS — M79676 Pain in unspecified toe(s): Secondary | ICD-10-CM | POA: Diagnosis not present

## 2017-11-20 DIAGNOSIS — H3562 Retinal hemorrhage, left eye: Secondary | ICD-10-CM | POA: Diagnosis not present

## 2017-11-20 DIAGNOSIS — H353221 Exudative age-related macular degeneration, left eye, with active choroidal neovascularization: Secondary | ICD-10-CM | POA: Diagnosis not present

## 2017-11-20 DIAGNOSIS — H43812 Vitreous degeneration, left eye: Secondary | ICD-10-CM | POA: Diagnosis not present

## 2017-11-20 DIAGNOSIS — L84 Corns and callosities: Secondary | ICD-10-CM | POA: Diagnosis not present

## 2017-12-02 DIAGNOSIS — Z85828 Personal history of other malignant neoplasm of skin: Secondary | ICD-10-CM | POA: Diagnosis not present

## 2017-12-02 DIAGNOSIS — L570 Actinic keratosis: Secondary | ICD-10-CM | POA: Diagnosis not present

## 2017-12-02 DIAGNOSIS — C44629 Squamous cell carcinoma of skin of left upper limb, including shoulder: Secondary | ICD-10-CM | POA: Diagnosis not present

## 2017-12-02 DIAGNOSIS — D485 Neoplasm of uncertain behavior of skin: Secondary | ICD-10-CM | POA: Diagnosis not present

## 2017-12-02 DIAGNOSIS — L821 Other seborrheic keratosis: Secondary | ICD-10-CM | POA: Diagnosis not present

## 2017-12-11 ENCOUNTER — Other Ambulatory Visit: Payer: Self-pay | Admitting: Internal Medicine

## 2017-12-11 DIAGNOSIS — Z79891 Long term (current) use of opiate analgesic: Secondary | ICD-10-CM | POA: Diagnosis not present

## 2017-12-11 DIAGNOSIS — M48061 Spinal stenosis, lumbar region without neurogenic claudication: Secondary | ICD-10-CM | POA: Diagnosis not present

## 2017-12-16 DIAGNOSIS — C44629 Squamous cell carcinoma of skin of left upper limb, including shoulder: Secondary | ICD-10-CM | POA: Diagnosis not present

## 2017-12-16 DIAGNOSIS — L570 Actinic keratosis: Secondary | ICD-10-CM | POA: Diagnosis not present

## 2018-01-01 DIAGNOSIS — M48061 Spinal stenosis, lumbar region without neurogenic claudication: Secondary | ICD-10-CM | POA: Diagnosis not present

## 2018-01-07 ENCOUNTER — Other Ambulatory Visit: Payer: Self-pay | Admitting: Internal Medicine

## 2018-01-08 DIAGNOSIS — H353134 Nonexudative age-related macular degeneration, bilateral, advanced atrophic with subfoveal involvement: Secondary | ICD-10-CM | POA: Diagnosis not present

## 2018-01-08 DIAGNOSIS — H35341 Macular cyst, hole, or pseudohole, right eye: Secondary | ICD-10-CM | POA: Diagnosis not present

## 2018-01-08 DIAGNOSIS — H3562 Retinal hemorrhage, left eye: Secondary | ICD-10-CM | POA: Diagnosis not present

## 2018-01-08 DIAGNOSIS — H353221 Exudative age-related macular degeneration, left eye, with active choroidal neovascularization: Secondary | ICD-10-CM | POA: Diagnosis not present

## 2018-01-19 ENCOUNTER — Other Ambulatory Visit: Payer: Self-pay | Admitting: Internal Medicine

## 2018-01-29 DIAGNOSIS — B351 Tinea unguium: Secondary | ICD-10-CM | POA: Diagnosis not present

## 2018-01-29 DIAGNOSIS — E1142 Type 2 diabetes mellitus with diabetic polyneuropathy: Secondary | ICD-10-CM | POA: Diagnosis not present

## 2018-01-29 DIAGNOSIS — L84 Corns and callosities: Secondary | ICD-10-CM | POA: Diagnosis not present

## 2018-01-29 DIAGNOSIS — M79676 Pain in unspecified toe(s): Secondary | ICD-10-CM | POA: Diagnosis not present

## 2018-02-12 DIAGNOSIS — H3562 Retinal hemorrhage, left eye: Secondary | ICD-10-CM | POA: Diagnosis not present

## 2018-02-12 DIAGNOSIS — H43812 Vitreous degeneration, left eye: Secondary | ICD-10-CM | POA: Diagnosis not present

## 2018-02-12 DIAGNOSIS — H353221 Exudative age-related macular degeneration, left eye, with active choroidal neovascularization: Secondary | ICD-10-CM | POA: Diagnosis not present

## 2018-02-18 NOTE — Progress Notes (Signed)
MEDICARE ANNUAL WELLNESS VISIT AND FOLLOW UP  Assessment:   Encounter for annual medicare wellness visit   Essential hypertension - continue medications, DASH diet, exercise and monitor at home. Call if greater than 130/80.  - CBC with Differential/Platelet - BASIC METABOLIC PANEL WITH GFR - Hepatic function panel  Chronic venous insufficiency weight loss discussed, continue compression stockings and elevation   Hypothyroidism, unspecified hypothyroidism type - TSH   Type 2 diabetes mellitus with diabetic chronic kidney disease Discussed general issues about diabetes pathophysiology and management., Educational material distributed., Suggested low cholesterol diet., Encouraged aerobic exercise., Discussed foot care., Reminded to get yearly retinal exam. Patient agrees to start exercising 3 times a week, getting on stationary bicycle Patient agrees to start working on diet more seriously, given sugar log and requested she monitor her weight as well and present at follow up appointment Foot exam done - Hemoglobin A1c  Hyperlipidemia -continue medications, check lipids, decrease fatty foods, increase activity.  - Lipid panel  Macular degeneration Cont follow up eye  Vitamin D deficiency - Vit D  25 hydroxy (rtn osteoporosis monitoring)  Morbid Obesity Obesity with co morbidities- long discussion about weight loss, diet, and exercise   DDD (degenerative disc disease), lumbar RICE, NSAIDS, followed by Dr. Nelva Bush   CKD (chronic kidney disease) stage 3, GFR 30-59 ml/min Increase fluids, avoid NSAIDS, monitor sugars, will monitor   DM polyneuropathy Continue gabpaentin, increase topical agents that seem to help  may need referral neuro, emphasized need to decrease sugars to prevent progression   Future Appointments  Date Time Provider [place]  05/29/2018 10:00 AM [name], MD [facility] None      Plan:   During the course of the visit the patient was  educated and counseled about appropriate screening and preventive services including:    Pneumococcal vaccine   Influenza vaccine  Td vaccine  Screening electrocardiogram  Screening mammography  Bone densitometry screening  Colorectal cancer screening  Diabetes screening  Glaucoma screening  Nutrition counseling   Advanced directives: given info/requested   Subjective:   Catherine Wood is a 84 y.o. female who presents for Medicare Annual Wellness Visit and 3 month follow up on hypertension, diabetes, hyperlipidemia, vitamin D def.   Follows with Dr. Nelva Bush for lower back pain/ and gets injections with him in her left hip. Walks with cane. She is now on oxycodone from Dr. Nelva Bush, will take no more than one a day.   BMI is Body mass index is 34.84 kg/m., she has not been working on diet and exercise. Wt Readings from Last 3 Encounters:  02/19/18 203 lb (92.1 kg)  10/31/17 200 lb 12.8 oz (91.1 kg)  10/02/17 196 lb (88.9 kg)   Her blood pressure has been controlled at home, today their BP is BP: 134/62 She does not workout. She denies chest pain, shortness of breath, dizziness.   She is on cholesterol medication and denies myalgias. Her cholesterol is at goal. The cholesterol last visit was:   Lab Results  Component Value Date   CHOL 220 (H) 10/31/2017   HDL 47 (L) 10/31/2017   LDLCALC 127 (H) 10/31/2017   TRIG 320 (H) 10/31/2017   CHOLHDL 4.7 10/31/2017   She has been working on diet and exercise for diabetes, she has Stage 3 CKD due to DM and complains of worsening bilateral feet paresthesias, she is on gabapentin 5 total a day, max,, she is on metformin 1000 mg BID, Humalog 70/30 mix, 40 AM 30 PM,  she is on bASA , she is on ARB and denies hypoglycemia , polydipsia and visual disturbances. She denies hypoglycemia, and checks sugar in the AM (widely ranges - 140-280). She admits she has been poorly compliant with diet recently and sugars have been higher. Last A1C in the  office was:  Lab Results  Component Value Date   HGBA1C 9.8 (H) 10/31/2017   Patient is on Vitamin D supplement. Lab Results  Component Value Date   VD25OH 40 10/31/2017     She is on thyroid medication. Her medication was not changed last visit, she is on one pill daily. Patient denies nervousness, palpitations and weight changes.  Lab Results  Component Value Date   TSH 2.05 10/31/2017      Medication Review Current Outpatient Medications on File Prior to Visit  Medication Sig Dispense Refill  . ACCU-CHEK SOFTCLIX LANCETS lancets CHECK BLOOD SUGER UP TO 3 TIMES A DAY 100 each 0  . aspirin 81 MG tablet Take 81 mg by mouth daily.    . Calcium Carbonate (CALCIUM 600 PO) Take 600 mg by mouth daily.    . Cholecalciferol (VITAMIN D3) 5000 UNITS CAPS Take 5,000 Units by mouth daily.     . cyclobenzaprine (FLEXERIL) 10 MG tablet Take 1 tablet (10 mg total) by mouth 2 (two) times daily as needed for muscle spasms. 20 tablet 0  . diclofenac sodium (VOLTAREN) 1 % GEL Apply 4 g topically 4 (four) times daily. 100 g 3  . fenofibrate 160 MG tablet TAKE 1 TABLET DAILY 90 tablet 0  . furosemide (LASIX) 40 MG tablet TAKE 1 OR 2 TABLETS DAILY AS NEEDED FOR SWELLING/EDEMA 180 tablet 1  . gabapentin (NEURONTIN) 300 MG capsule 1 CAPSULE EACH MORNING, 2 EACH AFTERNOON, AND 2 AT BEDTIME 450 capsule 0  . insulin NPH-regular Human (NOVOLIN 70/30) (70-30) 100 UNIT/ML injection Inject into the skin 2 (two) times daily with a meal. 40 units AM, 30 PM    . Insulin Pen Needle (PEN NEEDLES 31GX5/16") 31G X 8 MM MISC USE 4 TIMES A DAY 100 each 0  . levothyroxine (SYNTHROID, LEVOTHROID) 100 MCG tablet TAKE (1) TABLET DAILY BE- FORE BREAKFAST. 90 tablet 0  . losartan (COZAAR) 100 MG tablet TAKE 1 TABLET ONCE A DAY FOR HIGH BLOOD PRESSURE 90 tablet 1  . Magnesium 400 MG CAPS Take by mouth 4 (four) times daily.    Marland Kitchen MEGARED OMEGA-3 KRILL OIL 500 MG CAPS Take 500 mg by mouth 2 (two) times daily.    . metFORMIN  (GLUCOPHAGE) 1000 MG tablet TAKE (1) TABLET TWICE A DAY WITH MEALS (BREAKFAST AND SUPPER) 180 tablet 0  . Multiple Vitamins-Minerals (ICAPS PO) Take by mouth daily.    Marland Kitchen OVER THE COUNTER MEDICATION as needed. Macular Degeneration refresh eye drops    . oxyCODONE-acetaminophen (PERCOCET) 10-325 MG tablet Take 1 tablet by mouth every 8 (eight) hours as needed for pain.    . pravastatin (PRAVACHOL) 40 MG tablet TAKE 1 TABLET DAILY FOR CHOLESTEROL 90 tablet 1  . ranitidine (ZANTAC) 300 MG tablet TAKE 1 TABLET AT BEDTIME FOR ACID REFLUX AND HEART BURN 90 tablet 0   No current facility-administered medications on file prior to visit.     Current Problems (verified) Patient Active Problem List   Diagnosis Date Noted  . Uncontrolled type 2 diabetes mellitus ([place]) 08/21/2017  . Diabetic polyneuropathy associated with type 2 diabetes mellitus ([place]) 12/15/2015  . Cervical spondylosis with myelopathy 11/08/2014  . Obesity (BMI 30.0-34.9)  10/20/2014  . DDD (degenerative disc disease), lumbar 10/20/2014  . Chronic venous insufficiency 12/31/2013  . Vitamin D deficiency 12/30/2013  . Medication management 12/30/2013  . Hypertension   . Hypothyroid   . CKD stage 3 due to type 2 diabetes mellitus ([place])   . Hyperlipidemia   . Macular degeneration     Screening Tests Immunization History  Administered Date(s) Administered  . DT 10/20/2014  . Influenza Split 06/04/2017  . Influenza, High Dose Seasonal PF 07/01/2016  . Influenza-Unspecified 06/22/2013, 06/16/2014, 06/27/2014, 07/06/2015  . Pneumococcal Conjugate-13 04/05/2014  . Pneumococcal-Unspecified 09/09/2004  . Td 09/10/2003  . Zoster 09/09/2005    Preventative care: Last colonoscopy: 08/032016 Last mammogram: 12/2015 Last pap smear/pelvic exam: remote  DEXA: 2013, declines MRI brain 10/2014 CXR 12/2015  Prior vaccinations: TD or Tdap: 2016 Influenza: 2018 Pneumococcal: 2006  Prevnar 13 2015 Shingles/Zostavax: 2007  Names of  Other Physician/Practitioners you currently use: 1. [facility]- here for primary care 2. Dr. Marshall Cork, Dr. Zadie Rhine, eye doctor, last visit Diabetic eye10/04/2017- report abstracted, has had 2019 check 3. Dr. Lynnette Caffey, dentist, 2018  Dr. Steffanie Rainwater, podiatry, every 4-5 months  Patient Care Team: [name], MD as PCP - General (Internal Medicine) Clarene Essex, MD as Consulting Physician (Gastroenterology) Stanford Breed Denice Bors, MD as Consulting Physician (Cardiology) Monna Fam, MD as Consulting Physician (Ophthalmology) Zadie Rhine Clent Demark, MD as Consulting Physician (Ophthalmology) Deneise Lever, MD as Consulting Physician (Pulmonary Disease)  Allergies Allergies  Allergen Reactions  . Detrol [Tolterodine]     SURGICAL HISTORY She  has a past surgical history that includes Biopsy thyroid; Varicose vein surgery; Cataract extraction, bilateral; Endometrial biopsy (2000); Colonoscopy w/ biopsies and polypectomy; Tubal ligation; Anterior cervical decomp/discectomy fusion (N/A, 11/08/2014); and Spine surgery (Left,  11/08/2014). FAMILY HISTORY Her family history includes Cancer in her father and mother. SOCIAL HISTORY She  reports that she has never smoked. She has never used smokeless tobacco. She reports that she does not drink alcohol or use drugs.  MEDICARE WELLNESS OBJECTIVES: Physical activity: Current Exercise Habits: Home exercise routine, Type of exercise: walking, Exercise limited by: neurologic condition(s);orthopedic condition(s);Other - see comments(Vision ) Cardiac risk factors: Cardiac Risk Factors include: advanced age (>36men, >50 women);diabetes mellitus;dyslipidemia;hypertension;obesity (BMI >30kg/m2);sedentary lifestyle Depression/mood screen:   Depression screen [facility] 2/9 02/19/2018  Decreased Interest 0  Down, Depressed, Hopeless 0  PHQ - 2 Score 0    ADLs:  In your present state of health, do you have any difficulty  performing the following activities: 02/19/2018 11/02/2017  Hearing? Y N  Comment Has right hearing aid, needs to go back and have adjust -  Vision? Y N  Comment Followed by opthalmology -  Difficulty concentrating or making decisions? N N  Walking or climbing stairs? N N  Comment No longer climbing steps -  Dressing or bathing? N N  Doing errands, shopping? Y N  Comment Quit driving, driven by family  -  Conservation officer, nature and eating ? N -  Using the Toilet? N -  In the past six months, have you accidently leaked urine? Y -  Comment Wears a pad  -  Do you have problems with loss of bowel control? N -  Managing your Medications? N -  Managing your Finances? N -  Housekeeping or managing your Housekeeping? N -  Some recent data might be hidden     Cognitive Testing  Alert? Yes  Normal Appearance?Yes  Oriented to person? Yes  Place? Yes  Time? Yes  Recall of three objects?  Yes  Can perform simple calculations? Yes  Displays appropriate judgment?Yes  Can read the correct time from a watch face?Yes  EOL planning: Does Patient Have a Medical Advance Directive?: No Would patient like information on creating a medical advance directive?: No - Patient declined    Objective:   Blood pressure 134/62, pulse 82, temperature 97.6 F (36.4 C), height 5\' 4"  (1.626 m), weight 203 lb (92.1 kg), SpO2 95 %. Body mass index is 34.84 kg/m.  General appearance: alert, no distress, WD/WN,  female HEENT: normocephalic, sclerae anicteric, TMs pearly, nares patent, no discharge or erythema, pharynx normal Oral cavity: MMM, no lesions Neck: supple, no lymphadenopathy, no thyromegaly, no masses Heart: RRR, normal S1, S2, no murmurs, occ PVC Lungs: CTA bilaterally, no wheezes, rhonchi, or rales Abdomen: +bs, soft, obese, non tender, non distended, no masses, no hepatomegaly, no splenomegaly Musculoskeletal: nontender, no swelling, no obvious deformity Extremities: no edema (wearing compression), no  cyanosis, no clubbing Pulses: 2+ symmetric, upper and lower extremities, normal cap refill Neurological: alert, oriented x 3, CN2-12 intact, strength normal upper extremities and lower extremities, sensation decreased bilateral legs to mid shin, DTRs 2+ throughout, no cerebellar signs, gait slow but steady with cane, left arm with good ROM, good sensation, good pulses.  Psychiatric: normal affect, behavior normal, pleasant  Breast: defer Gyn: defer Rectal: defer   Medicare Attestation I have personally reviewed: The patient's medical and social history Their use of alcohol, tobacco or illicit drugs Their current medications and supplements The patient's functional ability including ADLs,fall risks, home safety risks, cognitive, and hearing and visual impairment Diet and physical activities Evidence for depression or mood disorders  The patient's weight, height, BMI, and visual acuity have been recorded in the chart.  I have made referrals, counseling, and provided education to the patient based on review of the above and I have provided the patient with a written personalized care plan for preventive services.     Izora Ribas, NP   02/19/2018

## 2018-02-19 ENCOUNTER — Ambulatory Visit (INDEPENDENT_AMBULATORY_CARE_PROVIDER_SITE_OTHER): Payer: Medicare Other | Admitting: Adult Health

## 2018-02-19 ENCOUNTER — Encounter: Payer: Self-pay | Admitting: Adult Health

## 2018-02-19 VITALS — BP 134/62 | HR 82 | Temp 97.6°F | Ht 64.0 in | Wt 203.0 lb

## 2018-02-19 DIAGNOSIS — N183 Chronic kidney disease, stage 3 (moderate): Secondary | ICD-10-CM | POA: Diagnosis not present

## 2018-02-19 DIAGNOSIS — Z0001 Encounter for general adult medical examination with abnormal findings: Secondary | ICD-10-CM | POA: Diagnosis not present

## 2018-02-19 DIAGNOSIS — E782 Mixed hyperlipidemia: Secondary | ICD-10-CM | POA: Diagnosis not present

## 2018-02-19 DIAGNOSIS — M4712 Other spondylosis with myelopathy, cervical region: Secondary | ICD-10-CM

## 2018-02-19 DIAGNOSIS — I10 Essential (primary) hypertension: Secondary | ICD-10-CM | POA: Diagnosis not present

## 2018-02-19 DIAGNOSIS — E1165 Type 2 diabetes mellitus with hyperglycemia: Secondary | ICD-10-CM

## 2018-02-19 DIAGNOSIS — E039 Hypothyroidism, unspecified: Secondary | ICD-10-CM

## 2018-02-19 DIAGNOSIS — M5136 Other intervertebral disc degeneration, lumbar region: Secondary | ICD-10-CM

## 2018-02-19 DIAGNOSIS — E1122 Type 2 diabetes mellitus with diabetic chronic kidney disease: Secondary | ICD-10-CM | POA: Diagnosis not present

## 2018-02-19 DIAGNOSIS — M51369 Other intervertebral disc degeneration, lumbar region without mention of lumbar back pain or lower extremity pain: Secondary | ICD-10-CM

## 2018-02-19 DIAGNOSIS — H3530 Unspecified macular degeneration: Secondary | ICD-10-CM | POA: Diagnosis not present

## 2018-02-19 DIAGNOSIS — E559 Vitamin D deficiency, unspecified: Secondary | ICD-10-CM | POA: Diagnosis not present

## 2018-02-19 DIAGNOSIS — I872 Venous insufficiency (chronic) (peripheral): Secondary | ICD-10-CM

## 2018-02-19 DIAGNOSIS — Z1231 Encounter for screening mammogram for malignant neoplasm of breast: Secondary | ICD-10-CM

## 2018-02-19 DIAGNOSIS — E1142 Type 2 diabetes mellitus with diabetic polyneuropathy: Secondary | ICD-10-CM

## 2018-02-19 DIAGNOSIS — Z1239 Encounter for other screening for malignant neoplasm of breast: Secondary | ICD-10-CM

## 2018-02-19 DIAGNOSIS — E66811 Obesity, class 1: Secondary | ICD-10-CM

## 2018-02-19 DIAGNOSIS — E669 Obesity, unspecified: Secondary | ICD-10-CM | POA: Diagnosis not present

## 2018-02-19 DIAGNOSIS — R6889 Other general symptoms and signs: Secondary | ICD-10-CM | POA: Diagnosis not present

## 2018-02-19 DIAGNOSIS — Z79899 Other long term (current) drug therapy: Secondary | ICD-10-CM | POA: Diagnosis not present

## 2018-02-19 DIAGNOSIS — Z0000 Encounter for general adult medical examination without abnormal findings: Secondary | ICD-10-CM

## 2018-02-19 NOTE — Patient Instructions (Addendum)
Please try to go to the GYM 3 times a week for an hour long session   Please follow up with your hearing aid provider - may need to get bilateral hearing aids - poor hearing can lead to dementia so very important to follow up on this  VERY important to work on diet for diabetes - Please keep a record of your weights and blood sugars   Targets for Glucose Readings: Time of Check Usual Target for Most People  Before Meals  70-130  Two hours after meals  Less than 180  Bedtime  90-150   Why Should You Check Your Blood Glucose? -The A1C tells you how your diabetes is doing over a 3 month period.  Home blood glucose monitoring (or checking) gives you information about your diabetes on a daily basis.  You will learn how well your diabetes care plan is working and whether your blood glucose is in your target range throughout the day.   -Reviewing daily blood glucose levels will help you and your healthcare team make any needed changes to you meal plan, physical activity and medications.      When it comes to diets, agreement about the perfect plan isn't easy to find, even among the experts. Experts at the [place] [place] developed an idea known as the Healthy Eating Plate. Just imagine a plate divided into logical, healthy portions.  The emphasis is on diet quality:  Load up on vegetables and fruits - one-half of your plate: Aim for color and variety, and remember that potatoes don't count.  Go for whole grains - one-quarter of your plate: Whole wheat, barley, wheat berries, quinoa, oats, brown rice, and foods made with them. If you want pasta, go with whole wheat pasta.  Protein power - one-quarter of your plate: Fish, chicken, beans, and nuts are all healthy, versatile protein sources. Limit red meat.  The diet, however, does go beyond the plate, offering a few other suggestions.  Use healthy plant oils, such as olive, canola, soy, corn, sunflower and peanut. Check the  labels, and avoid partially hydrogenated oil, which have unhealthy trans fats.  If you're thirsty, drink water. Coffee and tea are good in moderation, but skip sugary drinks and limit milk and dairy products to one or two daily servings.  The type of carbohydrate in the diet is more important than the amount. Some sources of carbohydrates, such as vegetables, fruits, whole grains, and beans-are healthier than others.  Finally, stay active.

## 2018-02-20 LAB — COMPLETE METABOLIC PANEL WITH GFR
AG RATIO: 1.6 (calc) (ref 1.0–2.5)
ALKALINE PHOSPHATASE (APISO): 45 U/L (ref 33–130)
ALT: 18 U/L (ref 6–29)
AST: 18 U/L (ref 10–35)
Albumin: 4.1 g/dL (ref 3.6–5.1)
BILIRUBIN TOTAL: 0.2 mg/dL (ref 0.2–1.2)
BUN/Creatinine Ratio: 22 (calc) (ref 6–22)
BUN: 23 mg/dL (ref 7–25)
CO2: 31 mmol/L (ref 20–32)
Calcium: 9.4 mg/dL (ref 8.6–10.4)
Chloride: 100 mmol/L (ref 98–110)
Creat: 1.03 mg/dL — ABNORMAL HIGH (ref 0.60–0.88)
GFR, EST NON AFRICAN AMERICAN: 50 mL/min/{1.73_m2} — AB (ref >=60)
GFR, Est African American: 57 mL/min/{1.73_m2} — ABNORMAL LOW (ref >=60)
GLOBULIN: 2.6 g/dL (ref 1.9–3.7)
Glucose, Bld: 196 mg/dL — ABNORMAL HIGH (ref 65–99)
POTASSIUM: 4.6 mmol/L (ref 3.5–5.3)
SODIUM: 139 mmol/L (ref 135–146)
Total Protein: 6.7 g/dL (ref 6.1–8.1)

## 2018-02-20 LAB — LIPID PANEL
CHOLESTEROL: 196 mg/dL (ref <200)
HDL: 50 mg/dL — ABNORMAL LOW (ref >50)
LDL Cholesterol (Calc): 114 mg/dL (calc) — ABNORMAL HIGH
NON-HDL CHOLESTEROL (CALC): 146 mg/dL — AB (ref <130)
Total CHOL/HDL Ratio: 3.9 (calc) (ref <5.0)
Triglycerides: 196 mg/dL — ABNORMAL HIGH (ref <150)

## 2018-02-20 LAB — CBC WITH DIFFERENTIAL/PLATELET
BASOS ABS: 84 {cells}/uL (ref 0–200)
Basophils Relative: 0.9 %
EOS ABS: 512 {cells}/uL — AB (ref 15–500)
Eosinophils Relative: 5.5 %
HEMATOCRIT: 36.6 % (ref 35.0–45.0)
Hemoglobin: 12.2 g/dL (ref 11.7–15.5)
Lymphs Abs: 2167 cells/uL (ref 850–3900)
MCH: 28.6 pg (ref 27.0–33.0)
MCHC: 33.3 g/dL (ref 32.0–36.0)
MCV: 85.9 fL (ref 80.0–100.0)
MPV: 11.6 fL (ref 7.5–12.5)
Monocytes Relative: 11 %
NEUTROS ABS: 5515 {cells}/uL (ref 1500–7800)
NEUTROS PCT: 59.3 %
Platelets: 334 10*3/uL (ref 140–400)
RBC: 4.26 10*6/uL (ref 3.80–5.10)
RDW: 12.5 % (ref 11.0–15.0)
Total Lymphocyte: 23.3 %
WBC mixed population: 1023 cells/uL — ABNORMAL HIGH (ref 200–950)
WBC: 9.3 10*3/uL (ref 3.8–10.8)

## 2018-02-20 LAB — TSH: TSH: 2.16 mIU/L (ref 0.40–4.50)

## 2018-02-20 LAB — HEMOGLOBIN A1C
EAG (MMOL/L): 12.4 (calc)
Hgb A1c MFr Bld: 9.4 % of total Hgb — ABNORMAL HIGH (ref <5.7)
Mean Plasma Glucose: 223 (calc)

## 2018-02-20 LAB — VITAMIN D 25 HYDROXY (VIT D DEFICIENCY, FRACTURES): Vit D, 25-Hydroxy: 33 ng/mL (ref 30–100)

## 2018-03-02 ENCOUNTER — Other Ambulatory Visit: Payer: Self-pay | Admitting: Internal Medicine

## 2018-03-09 ENCOUNTER — Other Ambulatory Visit: Payer: Self-pay | Admitting: Physician Assistant

## 2018-03-09 ENCOUNTER — Other Ambulatory Visit: Payer: Self-pay | Admitting: Adult Health

## 2018-03-16 DIAGNOSIS — M170 Bilateral primary osteoarthritis of knee: Secondary | ICD-10-CM | POA: Diagnosis not present

## 2018-03-16 DIAGNOSIS — M25561 Pain in right knee: Secondary | ICD-10-CM | POA: Diagnosis not present

## 2018-03-16 DIAGNOSIS — M25562 Pain in left knee: Secondary | ICD-10-CM | POA: Diagnosis not present

## 2018-03-19 DIAGNOSIS — H353221 Exudative age-related macular degeneration, left eye, with active choroidal neovascularization: Secondary | ICD-10-CM | POA: Diagnosis not present

## 2018-03-19 DIAGNOSIS — H353134 Nonexudative age-related macular degeneration, bilateral, advanced atrophic with subfoveal involvement: Secondary | ICD-10-CM | POA: Diagnosis not present

## 2018-03-19 DIAGNOSIS — H35341 Macular cyst, hole, or pseudohole, right eye: Secondary | ICD-10-CM | POA: Diagnosis not present

## 2018-03-19 DIAGNOSIS — H3562 Retinal hemorrhage, left eye: Secondary | ICD-10-CM | POA: Diagnosis not present

## 2018-03-31 DIAGNOSIS — M1712 Unilateral primary osteoarthritis, left knee: Secondary | ICD-10-CM | POA: Diagnosis not present

## 2018-03-31 DIAGNOSIS — M25562 Pain in left knee: Secondary | ICD-10-CM | POA: Diagnosis not present

## 2018-04-06 DIAGNOSIS — M1712 Unilateral primary osteoarthritis, left knee: Secondary | ICD-10-CM | POA: Diagnosis not present

## 2018-04-06 DIAGNOSIS — Z85828 Personal history of other malignant neoplasm of skin: Secondary | ICD-10-CM | POA: Diagnosis not present

## 2018-04-06 DIAGNOSIS — L578 Other skin changes due to chronic exposure to nonionizing radiation: Secondary | ICD-10-CM | POA: Diagnosis not present

## 2018-04-06 DIAGNOSIS — L821 Other seborrheic keratosis: Secondary | ICD-10-CM | POA: Diagnosis not present

## 2018-04-06 DIAGNOSIS — M25562 Pain in left knee: Secondary | ICD-10-CM | POA: Diagnosis not present

## 2018-04-09 DIAGNOSIS — L84 Corns and callosities: Secondary | ICD-10-CM | POA: Diagnosis not present

## 2018-04-09 DIAGNOSIS — M79676 Pain in unspecified toe(s): Secondary | ICD-10-CM | POA: Diagnosis not present

## 2018-04-09 DIAGNOSIS — E1142 Type 2 diabetes mellitus with diabetic polyneuropathy: Secondary | ICD-10-CM | POA: Diagnosis not present

## 2018-04-09 DIAGNOSIS — B351 Tinea unguium: Secondary | ICD-10-CM | POA: Diagnosis not present

## 2018-04-14 DIAGNOSIS — M1712 Unilateral primary osteoarthritis, left knee: Secondary | ICD-10-CM | POA: Diagnosis not present

## 2018-04-14 DIAGNOSIS — M25561 Pain in right knee: Secondary | ICD-10-CM | POA: Diagnosis not present

## 2018-04-14 DIAGNOSIS — M170 Bilateral primary osteoarthritis of knee: Secondary | ICD-10-CM | POA: Diagnosis not present

## 2018-04-14 DIAGNOSIS — M25562 Pain in left knee: Secondary | ICD-10-CM | POA: Diagnosis not present

## 2018-04-21 DIAGNOSIS — M1712 Unilateral primary osteoarthritis, left knee: Secondary | ICD-10-CM | POA: Diagnosis not present

## 2018-04-21 DIAGNOSIS — M25562 Pain in left knee: Secondary | ICD-10-CM | POA: Diagnosis not present

## 2018-04-27 DIAGNOSIS — H35341 Macular cyst, hole, or pseudohole, right eye: Secondary | ICD-10-CM | POA: Diagnosis not present

## 2018-04-27 DIAGNOSIS — M1712 Unilateral primary osteoarthritis, left knee: Secondary | ICD-10-CM | POA: Diagnosis not present

## 2018-04-27 DIAGNOSIS — H3562 Retinal hemorrhage, left eye: Secondary | ICD-10-CM | POA: Diagnosis not present

## 2018-04-27 DIAGNOSIS — H353221 Exudative age-related macular degeneration, left eye, with active choroidal neovascularization: Secondary | ICD-10-CM | POA: Diagnosis not present

## 2018-04-27 DIAGNOSIS — M25562 Pain in left knee: Secondary | ICD-10-CM | POA: Diagnosis not present

## 2018-04-27 DIAGNOSIS — H353134 Nonexudative age-related macular degeneration, bilateral, advanced atrophic with subfoveal involvement: Secondary | ICD-10-CM | POA: Diagnosis not present

## 2018-05-04 DIAGNOSIS — M25561 Pain in right knee: Secondary | ICD-10-CM | POA: Diagnosis not present

## 2018-05-04 DIAGNOSIS — M1711 Unilateral primary osteoarthritis, right knee: Secondary | ICD-10-CM | POA: Diagnosis not present

## 2018-05-07 ENCOUNTER — Other Ambulatory Visit: Payer: Self-pay | Admitting: Internal Medicine

## 2018-05-07 DIAGNOSIS — E1122 Type 2 diabetes mellitus with diabetic chronic kidney disease: Secondary | ICD-10-CM

## 2018-05-07 DIAGNOSIS — N183 Chronic kidney disease, stage 3 (moderate): Principal | ICD-10-CM

## 2018-05-14 DIAGNOSIS — M1711 Unilateral primary osteoarthritis, right knee: Secondary | ICD-10-CM | POA: Diagnosis not present

## 2018-05-14 DIAGNOSIS — M25562 Pain in left knee: Secondary | ICD-10-CM | POA: Diagnosis not present

## 2018-05-14 DIAGNOSIS — M25561 Pain in right knee: Secondary | ICD-10-CM | POA: Diagnosis not present

## 2018-05-18 DIAGNOSIS — M1711 Unilateral primary osteoarthritis, right knee: Secondary | ICD-10-CM | POA: Diagnosis not present

## 2018-05-18 DIAGNOSIS — M25561 Pain in right knee: Secondary | ICD-10-CM | POA: Diagnosis not present

## 2018-05-25 DIAGNOSIS — H3562 Retinal hemorrhage, left eye: Secondary | ICD-10-CM | POA: Diagnosis not present

## 2018-05-25 DIAGNOSIS — H353134 Nonexudative age-related macular degeneration, bilateral, advanced atrophic with subfoveal involvement: Secondary | ICD-10-CM | POA: Diagnosis not present

## 2018-05-25 DIAGNOSIS — M1711 Unilateral primary osteoarthritis, right knee: Secondary | ICD-10-CM | POA: Diagnosis not present

## 2018-05-25 DIAGNOSIS — H353221 Exudative age-related macular degeneration, left eye, with active choroidal neovascularization: Secondary | ICD-10-CM | POA: Diagnosis not present

## 2018-05-25 DIAGNOSIS — M25561 Pain in right knee: Secondary | ICD-10-CM | POA: Diagnosis not present

## 2018-05-26 DIAGNOSIS — M48061 Spinal stenosis, lumbar region without neurogenic claudication: Secondary | ICD-10-CM | POA: Diagnosis not present

## 2018-05-29 ENCOUNTER — Encounter: Payer: Self-pay | Admitting: Internal Medicine

## 2018-05-29 ENCOUNTER — Ambulatory Visit (INDEPENDENT_AMBULATORY_CARE_PROVIDER_SITE_OTHER): Payer: Medicare Other | Admitting: Internal Medicine

## 2018-05-29 VITALS — BP 140/72 | HR 76 | Temp 97.5°F | Resp 16 | Ht 63.5 in | Wt 201.0 lb

## 2018-05-29 DIAGNOSIS — E1122 Type 2 diabetes mellitus with diabetic chronic kidney disease: Secondary | ICD-10-CM

## 2018-05-29 DIAGNOSIS — Z0001 Encounter for general adult medical examination with abnormal findings: Secondary | ICD-10-CM

## 2018-05-29 DIAGNOSIS — N183 Chronic kidney disease, stage 3 unspecified: Secondary | ICD-10-CM

## 2018-05-29 DIAGNOSIS — E039 Hypothyroidism, unspecified: Secondary | ICD-10-CM

## 2018-05-29 DIAGNOSIS — Z1212 Encounter for screening for malignant neoplasm of rectum: Secondary | ICD-10-CM

## 2018-05-29 DIAGNOSIS — Z136 Encounter for screening for cardiovascular disorders: Secondary | ICD-10-CM

## 2018-05-29 DIAGNOSIS — Z794 Long term (current) use of insulin: Secondary | ICD-10-CM

## 2018-05-29 DIAGNOSIS — Z23 Encounter for immunization: Secondary | ICD-10-CM

## 2018-05-29 DIAGNOSIS — E1142 Type 2 diabetes mellitus with diabetic polyneuropathy: Secondary | ICD-10-CM

## 2018-05-29 DIAGNOSIS — Z0000 Encounter for general adult medical examination without abnormal findings: Secondary | ICD-10-CM | POA: Diagnosis not present

## 2018-05-29 DIAGNOSIS — K219 Gastro-esophageal reflux disease without esophagitis: Secondary | ICD-10-CM

## 2018-05-29 DIAGNOSIS — E559 Vitamin D deficiency, unspecified: Secondary | ICD-10-CM | POA: Diagnosis not present

## 2018-05-29 DIAGNOSIS — I10 Essential (primary) hypertension: Secondary | ICD-10-CM | POA: Diagnosis not present

## 2018-05-29 DIAGNOSIS — Z1211 Encounter for screening for malignant neoplasm of colon: Secondary | ICD-10-CM

## 2018-05-29 DIAGNOSIS — Z79899 Other long term (current) drug therapy: Secondary | ICD-10-CM

## 2018-05-29 DIAGNOSIS — E782 Mixed hyperlipidemia: Secondary | ICD-10-CM

## 2018-05-29 NOTE — Progress Notes (Signed)
[facility] [name], M.D.     Catherine Wood. Silverio Lay, P.A.-C Liane Comber, [place] [address] [place], [place] [address] Telephone [phone] Telefax [phone] Annual Screening/Preventative Visit & Comprehensive Evaluation &  Examination     This very nice 84 y.o. [facility]  presents for a Screening /Preventative Visit & comprehensive evaluation and management of multiple medical co-morbidities.  Patient has been followed for HTN, HLD, T2_IDDM  Prediabetes  and Vitamin D Deficiency.      HTN predates since 4. Patient's BP has been controlled at home and patient denies any cardiac symptoms as chest pain, palpitations, shortness of breath, dizziness or ankle swelling. Today's BP was initially elevated & rechecked at goal - 140/72.      Patient's hyperlipidemia is controlled with diet and medications. Patient denies myalgias or other medication SE's. Last lipids were at goal albeit elevated Trig's: Lab Results  Component Value Date   CHOL 166 05/29/2018   HDL 50 (L) 05/29/2018   LDLCALC 72 05/29/2018   TRIG 365 (H) 05/29/2018   CHOLHDL 3.3 05/29/2018      Patient has hx/o T2_IDDM (2003) with CKD3 (GFR 40). Patient endorses her Gluttony and overeating. She is on Novolin 70/30 and admits CBG's are running in the 200+ range. Patient denies reactive hypoglycemic symptoms, diabetic polys or paresthesias. 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      Finally, patient has history of Vitamin D Deficiency ("26"/2013)  and last Vitamin D was still very low: Lab Results  Component Value Date   VD25OH 38 05/29/2018   Current Outpatient Medications on File Prior to Visit  Medication  Sig  . ACCU-CHEK SOFTCLIX LANCETS lancets CHECK BLOOD SUGER UP TO 3 TIMES A DAY  . aspirin 81 MG tablet Take 81 mg by mouth daily.  . Calcium Carbonate (CALCIUM 600 PO) Take 600 mg by mouth daily.  . Cholecalciferol (VITAMIN D3) 5000 UNITS CAPS Take 5,000 Units by mouth daily.   . cyclobenzaprine (FLEXERIL) 10 MG tablet Take 1 tablet (10 mg total) by mouth 2 (two) times daily as needed for muscle spasms.  . diclofenac sodium (VOLTAREN) 1 % GEL Apply 4 g topically 4 (four) times daily.  . fenofibrate 160 MG tablet TAKE 1 TABLET DAILY  . furosemide (LASIX) 40 MG tablet TAKE 1 OR 2 TABLETS DAILY AS NEEDED FOR SWELLING/EDEMA  . gabapentin (NEURONTIN) 300 MG capsule 1 CAPSULE EACH MORNING, 2 EACH AFTERNOON, AND 2 AT BEDTIME  . Insulin Pen Needle (PEN NEEDLES 31GX5/16") 31G X 8 MM MISC USE 4 TIMES A DAY  . levothyroxine (SYNTHROID, LEVOTHROID) 100 MCG tablet TAKE (1) TABLET DAILY BE- FORE BREAKFAST.  Marland Kitchen losartan (COZAAR) 100 MG tablet TAKE 1 TABLET ONCE A DAY FOR HIGH BLOOD PRESSURE  . Magnesium 400 MG CAPS Take by mouth 4 (four) times daily.  Marland Kitchen MEGARED OMEGA-3 KRILL OIL 500 MG CAPS Take 500 mg by mouth 2 (two) times daily.  . metFORMIN (GLUCOPHAGE) 1000 MG tablet TAKE (1) TABLET TWICE A DAY WITH MEALS (BREAKFAST AND SUPPER)  . Multiple Vitamins-Minerals (ICAPS PO) Take by mouth daily.  Marland Kitchen NOVOLIN 70/30 RELION (70-30) 100 UNIT/ML injection INJECT AS DIRECTED  .  OVER THE COUNTER MEDICATION as needed. Macular Degeneration refresh eye drops  . oxyCODONE-acetaminophen (PERCOCET) 10-325 MG tablet Take 1 tablet by mouth every 8 (eight) hours as needed for pain.  . pravastatin (PRAVACHOL) 40 MG tablet TAKE 1 TABLET DAILY FOR CHOLESTEROL  . ranitidine (ZANTAC) 300 MG tablet TAKE 1 TABLET AT BEDTIME FOR ACID REFLUX AND HEART BURN   No current facility-administered medications on file prior to visit.    Allergies  Allergen Reactions  . Detrol [Tolterodine]    Past Medical History:  Diagnosis Date  .  Arthritis   . GERD (gastroesophageal reflux disease)   . HOH (hard of hearing)   . Hyperlipidemia   . Hypertension   . Hypothyroid   . IDDM (insulin dependent diabetes mellitus) ([place])   . Leg swelling   . Macular degeneration   . Shortness of breath dyspnea    with exertion  . Sleep apnea    does not wear CPAP  . Varicose veins   . Wears glasses    Health Maintenance  Topic Date Due  . OPHTHALMOLOGY EXAM  06/16/2018  . HEMOGLOBIN A1C  11/27/2018  . FOOT EXAM  05/30/2019  . TETANUS/TDAP  10/20/2024  . INFLUENZA VACCINE  Completed  . DEXA SCAN  Completed  . PNA vac Low Risk Adult  Completed   Immunization History  Administered Date(s) Administered  . DT 10/20/2014  . Influenza Split 06/04/2017  . Influenza, High Dose Seasonal PF 07/01/2016, 05/29/2018  . Influenza-Unspecified 06/22/2013, 06/16/2014, 06/27/2014, 07/06/2015  . Pneumococcal Conjugate-13 04/05/2014  . Pneumococcal-Unspecified 09/09/2004  . Td 09/10/2003  . Zoster 09/09/2005   Last Colon - 04/12/2015 - Dr Watt Climes - deferred f/u due to age  Last MGM - scheduled 06/04/2018  Past Surgical History:  Procedure Laterality Date  . ANTERIOR CERVICAL DECOMP/DISCECTOMY FUSION N/A 11/08/2014   Procedure: Cervical three-four,  Cervical six-seven anterior cervical decompression with fusion plating and bonegraft;  Surgeon: Ashok Pall, MD;  Location: [facility];  Service: Neurosurgery;  Laterality: N/A;  Cervical three-four,  Cervical six-seven anterior cervical decompression with fusion plating and bonegraft  . BIOPSY THYROID     benign thyroid nodule removal  . CATARACT EXTRACTION, BILATERAL    . COLONOSCOPY W/ BIOPSIES AND POLYPECTOMY    . ENDOMETRIAL BIOPSY  2000  . SPINE SURGERY Left  11/08/2014   Left ACDF   . TUBAL LIGATION    . VARICOSE VEIN SURGERY     Family History  Problem Relation Age of Onset  . Cancer Mother        colon  . Cancer Father        GI   Social History   Tobacco Use  . Smoking  status: Never Smoker  . Smokeless tobacco: Never Used  Substance Use Topics  . Alcohol use: No  . Drug use: No    ROS Constitutional: Denies fever, chills, weight loss/gain, headaches, insomnia,  night sweats, and change in appetite. Does c/o fatigue. Eyes: Denies redness, blurred vision, diplopia, discharge, itchy, watery eyes.  ENT: Denies discharge, congestion, post nasal drip, epistaxis, sore throat, earache, hearing loss, dental pain, Tinnitus, Vertigo, Sinus pain, snoring.  Cardio: Denies chest pain, palpitations, irregular heartbeat, syncope, dyspnea, diaphoresis, orthopnea, PND, claudication, edema Respiratory: denies cough, dyspnea, DOE, pleurisy, hoarseness, laryngitis, wheezing.  Gastrointestinal: Denies dysphagia, heartburn, reflux, water brash, pain, cramps, nausea, vomiting, bloating, diarrhea, constipation, hematemesis, melena, hematochezia, jaundice, hemorrhoids Genitourinary: Denies dysuria, frequency, urgency, nocturia, hesitancy, discharge, hematuria, flank pain Breast: Breast lumps, nipple  discharge, bleeding.  Musculoskeletal: Denies arthralgia, myalgia, stiffness, Jt. Swelling, pain, limp, and strain/sprain. Denies falls. Skin: Denies puritis, rash, hives, warts, acne, eczema, changing in skin lesion Neuro: No weakness, tremor, incoordination, spasms, paresthesia, pain Psychiatric: Denies confusion, memory loss, sensory loss. Denies Depression. Endocrine: Denies change in weight, skin, hair change, nocturia, and paresthesia, diabetic polys, visual blurring, hyper / hypo glycemic episodes.  Heme/Lymph: No excessive bleeding, bruising, enlarged lymph nodes.  Physical Exam  BP 140/72   Pulse 76   Temp (!) 97.5 F (36.4 C)   Resp 16   Ht 5' 3.5" (1.613 m)   Wt 201 lb (91.2 kg)   BMI 35.05 kg/m   General Appearance: Well nourished, well groomed and in no apparent distress.  Eyes: PERRLA, EOMs, conjunctiva no swelling or erythema. Pupil constricted and fundi not  visualized.  Sinuses: No frontal/maxillary tenderness ENT/Mouth: EACs patent / TMs  nl. Nares clear without erythema, swelling, mucoid exudates. Oral hygiene is good. No erythema, swelling, or exudate. Tongue normal, non-obstructing. Tonsils not swollen or erythematous. Hearing normal.  Neck: Supple, thyroid not palpable. No bruits, nodes or JVD. Respiratory: Respiratory effort normal.  BS equal and clear bilateral without rales, rhonci, wheezing or stridor. Cardio: Heart sounds are normal with regular rate and rhythm and no murmurs, rubs or gallops. Peripheral pulses are normal and equal bilaterally without edema. No aortic or femoral bruits. Chest: symmetric with normal excursions and percussion. Breasts: Symmetric, without lumps, nipple discharge, retractions, or fibrocystic changes.  Abdomen: Flat, soft with bowel sounds active. Nontender, no guarding, rebound, hernias, masses, or organomegaly.  Lymphatics: Non tender without lymphadenopathy.  Musculoskeletal: Full ROM all peripheral extremities, joint stability, 5/5 strength, and normal gait. Skin: Warm and dry without rashes, lesions, cyanosis, clubbing or  ecchymosis.  Neuro: Cranial nerves intact, reflexes equal bilaterally. Normal muscle tone, no cerebellar symptoms. Sensation intact to touch & sl decreased to vibratory and Monofilament to the toes bilaterally. Pysch: Alert and oriented X 3, normal affect, Insight and Judgment appropriate.   Assessment and Plan  1. Annual Preventative Screening Examination  2. Essential hypertension  - EKG 12-Lead - Urinalysis, Routine w reflex microscopic - Microalbumin / creatinine urine ratio - CBC with Differential/Platelet - COMPLETE METABOLIC PANEL WITH GFR - Magnesium - TSH  3. Hyperlipidemia, mixed  - EKG 12-Lead - Lipid panel  4. Type 2 diabetes mellitus with stage 3 chronic kidney disease, with long-term current use of insulin (HCC)  - EKG 12-Lead - Urinalysis, Routine w reflex  microscopic - Microalbumin / creatinine urine ratio - HM DIABETES FOOT EXAM - LOW EXTREMITY NEUR EXAM DOCUM - Hemoglobin A1c  5. Vitamin D deficiency  - VITAMIN D 25 Hydroxyl  6. Hypothyroidism  - TSH  7. Diabetic polyneuropathy associated with type 2 diabetes mellitus (HCC)  - HM DIABETES FOOT EXAM - LOW EXTREMITY NEUR EXAM DOCUM - Hemoglobin A1c  8. Gastroesophageal reflux disease  - CBC with Differential/Platelet  9. Screening for colorectal cancer  - POC Hemoccult Bld/Stl  10. Screening for ischemic heart disease  - EKG 12-Lead  11. Medication management  - Urinalysis, Routine w reflex microscopic - Microalbumin / creatinine urine ratio - CBC with Differential/Platelet - COMPLETE METABOLIC PANEL WITH GFR - Magnesium - TSH - Hemoglobin A1c - Insulin, random - VITAMIN D 25 Hydroxyl  12. Need for prophylactic vaccination and inoculation against influenza  - Flu vaccine HIGH DOSE PF (Fluzone High dose)  Patient was counseled in prudent diet to achieve/maintain BMI less than 25 for weight control, BP monitoring, regular exercise and medications. Discussed med's effects and SE's. Screening labs and tests as requested with regular follow-up as recommended. Over 40 minutes of exam, counseling, chart review and high complex critical decision making was performed.

## 2018-05-29 NOTE — Patient Instructions (Signed)

## 2018-05-30 ENCOUNTER — Other Ambulatory Visit: Payer: Self-pay | Admitting: Internal Medicine

## 2018-05-30 ENCOUNTER — Encounter: Payer: Self-pay | Admitting: Internal Medicine

## 2018-05-30 DIAGNOSIS — N390 Urinary tract infection, site not specified: Secondary | ICD-10-CM

## 2018-05-30 DIAGNOSIS — G4700 Insomnia, unspecified: Secondary | ICD-10-CM

## 2018-05-30 MED ORDER — ALPRAZOLAM 0.5 MG PO TABS: ORAL_TABLET | ORAL | 0 refills | Status: DC

## 2018-05-30 MED ORDER — CIPROFLOXACIN HCL 250 MG PO TABS: ORAL_TABLET | ORAL | 0 refills | Status: DC

## 2018-06-01 ENCOUNTER — Encounter: Payer: Self-pay | Admitting: *Deleted

## 2018-06-01 DIAGNOSIS — M25561 Pain in right knee: Secondary | ICD-10-CM | POA: Diagnosis not present

## 2018-06-01 DIAGNOSIS — M1711 Unilateral primary osteoarthritis, right knee: Secondary | ICD-10-CM | POA: Diagnosis not present

## 2018-06-01 LAB — COMPLETE METABOLIC PANEL WITH GFR
AG Ratio: 2 (calc) (ref 1.0–2.5)
ALBUMIN MSPROF: 4.3 g/dL (ref 3.6–5.1)
ALT: 18 U/L (ref 6–29)
AST: 19 U/L (ref 10–35)
Alkaline phosphatase (APISO): 46 U/L (ref 33–130)
BUN / CREAT RATIO: 28 (calc) — AB (ref 6–22)
BUN: 29 mg/dL — ABNORMAL HIGH (ref 7–25)
CO2: 34 mmol/L — ABNORMAL HIGH (ref 20–32)
CREATININE: 1.03 mg/dL — AB (ref 0.60–0.88)
Calcium: 10 mg/dL (ref 8.6–10.4)
Chloride: 96 mmol/L — ABNORMAL LOW (ref 98–110)
GFR, EST NON AFRICAN AMERICAN: 50 mL/min/{1.73_m2} — AB (ref >=60)
GFR, Est African American: 57 mL/min/{1.73_m2} — ABNORMAL LOW (ref >=60)
GLOBULIN: 2.2 g/dL (ref 1.9–3.7)
Glucose, Bld: 219 mg/dL — ABNORMAL HIGH (ref 65–99)
Potassium: 4.9 mmol/L (ref 3.5–5.3)
SODIUM: 139 mmol/L (ref 135–146)
TOTAL PROTEIN: 6.5 g/dL (ref 6.1–8.1)
Total Bilirubin: 0.4 mg/dL (ref 0.2–1.2)

## 2018-06-01 LAB — URINALYSIS, ROUTINE W REFLEX MICROSCOPIC
BILIRUBIN URINE: NEGATIVE
Glucose, UA: NEGATIVE
HGB URINE DIPSTICK: NEGATIVE
Hyaline Cast: NONE SEEN /LPF
Nitrite: POSITIVE — AB
Specific Gravity, Urine: 1.02 (ref 1.001–1.03)
pH: 7 (ref 5.0–8.0)

## 2018-06-01 LAB — CBC WITH DIFFERENTIAL/PLATELET
Basophils Absolute: 64 cells/uL (ref 0–200)
Basophils Relative: 0.6 %
EOS ABS: 503 {cells}/uL — AB (ref 15–500)
Eosinophils Relative: 4.7 %
HEMATOCRIT: 38.8 % (ref 35.0–45.0)
Hemoglobin: 12.6 g/dL (ref 11.7–15.5)
Lymphs Abs: 2001 cells/uL (ref 850–3900)
MCH: 27.6 pg (ref 27.0–33.0)
MCHC: 32.5 g/dL (ref 32.0–36.0)
MCV: 85.1 fL (ref 80.0–100.0)
MPV: 11.4 fL (ref 7.5–12.5)
Monocytes Relative: 10.7 %
NEUTROS PCT: 65.3 %
Neutro Abs: 6987 cells/uL (ref 1500–7800)
PLATELETS: 341 10*3/uL (ref 140–400)
RBC: 4.56 10*6/uL (ref 3.80–5.10)
RDW: 12.8 % (ref 11.0–15.0)
Total Lymphocyte: 18.7 %
WBC: 10.7 10*3/uL (ref 3.8–10.8)
WBCMIX: 1145 {cells}/uL — AB (ref 200–950)

## 2018-06-01 LAB — LIPID PANEL
Cholesterol: 166 mg/dL (ref <200)
HDL: 50 mg/dL — ABNORMAL LOW (ref >50)
LDL Cholesterol (Calc): 72 mg/dL (calc)
Non-HDL Cholesterol (Calc): 116 mg/dL (calc) (ref <130)
TRIGLYCERIDES: 365 mg/dL — AB (ref <150)
Total CHOL/HDL Ratio: 3.3 (calc) (ref <5.0)

## 2018-06-01 LAB — MICROALBUMIN / CREATININE URINE RATIO
CREATININE, URINE: 102 mg/dL (ref 20–275)
Microalb Creat Ratio: 63 mcg/mg creat — ABNORMAL HIGH (ref <30)
Microalb, Ur: 6.4 mg/dL

## 2018-06-01 LAB — INSULIN, RANDOM: Insulin: 54 u[IU]/mL — ABNORMAL HIGH (ref 2.0–19.6)

## 2018-06-01 LAB — HEMOGLOBIN A1C
EAG (MMOL/L): 12.4 (calc)
Hgb A1c MFr Bld: 9.4 % of total Hgb — ABNORMAL HIGH (ref <5.7)
Mean Plasma Glucose: 223 (calc)

## 2018-06-01 LAB — MAGNESIUM: Magnesium: 2 mg/dL (ref 1.5–2.5)

## 2018-06-01 LAB — TSH: TSH: 4.12 m[IU]/L (ref 0.40–4.50)

## 2018-06-01 LAB — VITAMIN D 25 HYDROXY (VIT D DEFICIENCY, FRACTURES): VIT D 25 HYDROXY: 38 ng/mL (ref 30–100)

## 2018-06-03 ENCOUNTER — Other Ambulatory Visit: Payer: Self-pay | Admitting: Adult Health

## 2018-06-04 ENCOUNTER — Ambulatory Visit
Admission: RE | Admit: 2018-06-04 | Discharge: 2018-06-04 | Disposition: A | Discharge status: Home / Self Care | Payer: Medicare Other | Source: Ambulatory Visit | Attending: Adult Health | Admitting: Adult Health

## 2018-06-04 DIAGNOSIS — Z1231 Encounter for screening mammogram for malignant neoplasm of breast: Secondary | ICD-10-CM | POA: Diagnosis not present

## 2018-06-04 DIAGNOSIS — Z1239 Encounter for other screening for malignant neoplasm of breast: Secondary | ICD-10-CM

## 2018-06-08 DIAGNOSIS — M25561 Pain in right knee: Secondary | ICD-10-CM | POA: Diagnosis not present

## 2018-06-23 ENCOUNTER — Other Ambulatory Visit: Payer: Self-pay

## 2018-06-23 DIAGNOSIS — Z1211 Encounter for screening for malignant neoplasm of colon: Secondary | ICD-10-CM | POA: Diagnosis not present

## 2018-06-23 DIAGNOSIS — Z1212 Encounter for screening for malignant neoplasm of rectum: Principal | ICD-10-CM

## 2018-06-23 LAB — POC HEMOCCULT BLD/STL (HOME/3-CARD/SCREEN)
Card #2 Fecal Occult Blod, POC: NEGATIVE
FECAL OCCULT BLD: NEGATIVE
FECAL OCCULT BLD: NEGATIVE

## 2018-06-25 DIAGNOSIS — M79676 Pain in unspecified toe(s): Secondary | ICD-10-CM | POA: Diagnosis not present

## 2018-06-25 DIAGNOSIS — B351 Tinea unguium: Secondary | ICD-10-CM | POA: Diagnosis not present

## 2018-06-25 DIAGNOSIS — L84 Corns and callosities: Secondary | ICD-10-CM | POA: Diagnosis not present

## 2018-06-25 DIAGNOSIS — E1142 Type 2 diabetes mellitus with diabetic polyneuropathy: Secondary | ICD-10-CM | POA: Diagnosis not present

## 2018-06-29 DIAGNOSIS — H353221 Exudative age-related macular degeneration, left eye, with active choroidal neovascularization: Secondary | ICD-10-CM | POA: Diagnosis not present

## 2018-06-29 DIAGNOSIS — H3562 Retinal hemorrhage, left eye: Secondary | ICD-10-CM | POA: Diagnosis not present

## 2018-06-29 DIAGNOSIS — H43812 Vitreous degeneration, left eye: Secondary | ICD-10-CM | POA: Diagnosis not present

## 2018-07-01 ENCOUNTER — Ambulatory Visit: Payer: Self-pay

## 2018-07-02 ENCOUNTER — Other Ambulatory Visit: Payer: Self-pay | Admitting: Internal Medicine

## 2018-07-07 ENCOUNTER — Ambulatory Visit (INDEPENDENT_AMBULATORY_CARE_PROVIDER_SITE_OTHER): Payer: Medicare Other

## 2018-07-07 DIAGNOSIS — N390 Urinary tract infection, site not specified: Secondary | ICD-10-CM

## 2018-07-07 NOTE — Progress Notes (Addendum)
Pt has finished ABX as well. Pt reports for Urine culture & urinalysis which were already in Epic & released at the time of the visit. Pt had no concerns or questions at this time. Vitals were taken at intake and entered into Epic & then the pt was lead to the lab's restroom.

## 2018-07-09 ENCOUNTER — Other Ambulatory Visit: Payer: Self-pay | Admitting: Internal Medicine

## 2018-07-09 DIAGNOSIS — N390 Urinary tract infection, site not specified: Secondary | ICD-10-CM

## 2018-07-09 LAB — URINALYSIS, ROUTINE W REFLEX MICROSCOPIC
BILIRUBIN URINE: NEGATIVE
HGB URINE DIPSTICK: NEGATIVE
HYALINE CAST: NONE SEEN /LPF
KETONES UR: NEGATIVE
NITRITE: POSITIVE — AB
RBC / HPF: NONE SEEN /HPF (ref 0–2)
Specific Gravity, Urine: 1.021 (ref 1.001–1.03)
pH: 6.5 (ref 5.0–8.0)

## 2018-07-09 LAB — URINE CULTURE
MICRO NUMBER:: 91301608
SPECIMEN QUALITY:: ADEQUATE

## 2018-07-09 MED ORDER — SULFAMETHOXAZOLE-TRIMETHOPRIM 800-160 MG PO TABS: ORAL_TABLET | ORAL | 0 refills | Status: DC

## 2018-07-12 ENCOUNTER — Telehealth: Payer: Self-pay | Admitting: Physician Assistant

## 2018-07-12 NOTE — Telephone Encounter (Signed)
-----   Message from Elenor Quinones, [place] sent at 07/09/2018  4:12 PM EDT ----- Regarding: URINARY [facility] Contact: [phone] PER YELLOW OFFICE NOTE:   Pt reports having some urinary symptoms today & would like something called into the pharmacy in case it gets worse over the weekend. Please advise?  Pharmacy: [facility]

## 2018-07-21 DIAGNOSIS — L570 Actinic keratosis: Secondary | ICD-10-CM | POA: Diagnosis not present

## 2018-08-03 DIAGNOSIS — H353221 Exudative age-related macular degeneration, left eye, with active choroidal neovascularization: Secondary | ICD-10-CM | POA: Diagnosis not present

## 2018-08-03 DIAGNOSIS — H353134 Nonexudative age-related macular degeneration, bilateral, advanced atrophic with subfoveal involvement: Secondary | ICD-10-CM | POA: Diagnosis not present

## 2018-08-03 DIAGNOSIS — H35341 Macular cyst, hole, or pseudohole, right eye: Secondary | ICD-10-CM | POA: Diagnosis not present

## 2018-08-03 DIAGNOSIS — H3562 Retinal hemorrhage, left eye: Secondary | ICD-10-CM | POA: Diagnosis not present

## 2018-08-11 DIAGNOSIS — M5136 Other intervertebral disc degeneration, lumbar region: Secondary | ICD-10-CM | POA: Diagnosis not present

## 2018-08-11 DIAGNOSIS — M48061 Spinal stenosis, lumbar region without neurogenic claudication: Secondary | ICD-10-CM | POA: Diagnosis not present

## 2018-08-25 DIAGNOSIS — M5416 Radiculopathy, lumbar region: Secondary | ICD-10-CM | POA: Diagnosis not present

## 2018-08-25 DIAGNOSIS — M545 Low back pain: Secondary | ICD-10-CM | POA: Diagnosis not present

## 2018-08-25 DIAGNOSIS — G894 Chronic pain syndrome: Secondary | ICD-10-CM | POA: Diagnosis not present

## 2018-08-27 DIAGNOSIS — E1142 Type 2 diabetes mellitus with diabetic polyneuropathy: Secondary | ICD-10-CM | POA: Diagnosis not present

## 2018-08-27 DIAGNOSIS — M79676 Pain in unspecified toe(s): Secondary | ICD-10-CM | POA: Diagnosis not present

## 2018-08-27 DIAGNOSIS — L84 Corns and callosities: Secondary | ICD-10-CM | POA: Diagnosis not present

## 2018-08-27 DIAGNOSIS — B351 Tinea unguium: Secondary | ICD-10-CM | POA: Diagnosis not present

## 2018-08-31 ENCOUNTER — Other Ambulatory Visit: Payer: Self-pay | Admitting: Adult Health

## 2018-09-02 NOTE — Progress Notes (Deleted)
FOLLOW UP  Assessment and Plan:   Hypertension Elevated today in office; has not been checking BP at home - will have her check blood pressures daily and present with chart at next visit; would like to postpone adding another agent due to age Monitor blood pressure at home; patient to call if consistently greater than 140/80 Continue DASH diet.   Reminder to go to the ER if any CP, SOB, nausea, dizziness, severe HA, changes vision/speech, left arm numbness and tingling and jaw pain.  Cholesterol Continue medications; discussed at length that we cannot out medicate patient's dietary choices Continue to emphasize low cholesterol diet and exercise.  Check lipid panel.   Uncontrolled type 2 diabetes mellitus with hyperglycemia (HCC) Continue medications - metformin, novolog increased at last visit to 40 units AM, 30 units PM Continue to emphasize diet and exercise.  Perform daily foot/skin check, notify office of any concerning changes.  Check A1C - pending results may follow up with 4 weeks rather than 3 months  Obesity with co morbidities Long discussion about weight loss, diet, and exercise Recommended diet heavy in fruits and veggies and low in animal meats, cheeses, and dairy products, processed foods and simple sugars, appropriate calorie intake Discussed appropriate weight  Follow up at next visit  Hypothyroidism continue medications the same reminded to take on an empty stomach 30-42mins before food.  check TSH level  Vitamin D Def/ osteoporosis prevention Continue supplementation Check Vit D level  CKD stage 3 due to type 2 diabetes mellitus (HCC) Increase fluids, avoid NSAIDS, emphasized crucial need for improved sugars, will monitor  Compliance with medication regimen Questionable mild dementia; possible difficulty with compliance with medications. If poor control of medical conditions continues, may need to consider home health nurse for medication management.    Continue diet and meds as discussed. Further disposition pending results of labs. Discussed med's effects and SE's.   Over 30 minutes of exam, counseling, chart review, and critical decision making was performed.   Future Appointments  Date Time Provider [place]  09/03/2018  2:30 PM Vicie Mutters, PA-C [facility] None  12/03/2018  2:30 PM [name], MD [facility] None  03/01/2019 11:15 AM Liane Comber, NP [facility] None  06/28/2019 10:00 AM [name], MD [facility] None    ----------------------------------------------------------------------------------------------------------------------  HPI 84 y.o. female  presents for 3 month follow up on hypertension, cholesterol, T2DM, hypothyroid,obesity and vitamin D deficiency. Her DM is treated by metformin and novolog insulin; A1C was noted to be 10.4 at last check - insulin was increased to 40 units AM and 30 units PM. She has not presented with a sugar log today - reports fasting AM values 129-180, cannot recall highest and lowest values post-prandial - "really varies with how I'm doing with my diet." Questionable compliance with medications - concerned about possible early dementia; brain MRI 2016 reviewed demonstrating mild cerebral atrophy and likely chronic ischemic changes. Patient denies acute neurological symptoms, weakness, changes in memory. She currently lives at home alone - her husband passed away last year for whom she was the primary cargiver - she is driven by her son and daughter who live in town.   She continues to follow with Dr. Nelva Bush for pain management and back injections.   BMI is There is no height or weight on file to calculate BMI., she has been working on diet but has not been exercising due to chronic pain. Wt Readings from Last 3 Encounters:  07/07/18 199 lb 9.6 oz (90.5 kg)  05/29/18 201 lb (91.2 kg)  02/19/18 203 lb (92.1 kg)   She has not been checking her blood pressure at home,  today their BP is    She does not workout. She denies chest pain, shortness of breath, dizziness.   She is on cholesterol medication and denies myalgias. Her cholesterol is not at goal. The cholesterol last visit was:   Lab Results  Component Value Date   CHOL 166 05/29/2018   HDL 50 (L) 05/29/2018   LDLCALC 72 05/29/2018   TRIG 365 (H) 05/29/2018   CHOLHDL 3.3 05/29/2018    She has been working on diet but not exercise for Type 2 diabetes mellitus treated by metformin and , and denies hypoglycemia , increased appetite, nausea, polydipsia, polyuria and visual disturbances. She does endorse numbness/tingling of bilateral lower extremities. Last A1C in the office was:  Lab Results  Component Value Date   HGBA1C 9.4 (H) 05/29/2018   She is on thyroid medication. Her medication was not changed last visit.   Lab Results  Component Value Date   TSH 4.12 05/29/2018   Patient is on Vitamin D supplement but was very low at the last visit:    Lab Results  Component Value Date   VD25OH 38 05/29/2018       Current Medications:  Current Outpatient Medications on File Prior to Visit  Medication Sig  . ACCU-CHEK SOFTCLIX LANCETS lancets CHECK BLOOD SUGER UP TO 3 TIMES A DAY  . ALPRAZolam (XANAX) 0.5 MG tablet Take 1/2 to 1 tablet if need for sleep.  Marland Kitchen aspirin 81 MG tablet Take 81 mg by mouth daily.  . Calcium Carbonate (CALCIUM 600 PO) Take 600 mg by mouth daily.  . Cholecalciferol (VITAMIN D3) 5000 UNITS CAPS Take 5,000 Units by mouth daily.   . ciprofloxacin (CIPRO) 250 MG tablet Take 1 tablet 2 x /day with food for UTI  . cyclobenzaprine (FLEXERIL) 10 MG tablet Take 1 tablet (10 mg total) by mouth 2 (two) times daily as needed for muscle spasms.  . diclofenac sodium (VOLTAREN) 1 % GEL Apply 4 g topically 4 (four) times daily.  . fenofibrate 160 MG tablet TAKE 1 TABLET DAILY  . fenofibrate 160 MG tablet TAKE 1 TABLET DAILY  . furosemide (LASIX) 40 MG tablet TAKE 1 OR 2 TABLETS DAILY AS  NEEDED FOR SWELLING/EDEMA  . gabapentin (NEURONTIN) 300 MG capsule 1 CAPSULE EACH MORNING, 2 EACH AFTERNOON, AND 2 AT BEDTIME  . Insulin Pen Needle (PEN NEEDLES 31GX5/16") 31G X 8 MM MISC USE 4 TIMES A DAY  . levothyroxine (SYNTHROID, LEVOTHROID) 100 MCG tablet TAKE (1) TABLET DAILY BE- FORE BREAKFAST.  Marland Kitchen losartan (COZAAR) 100 MG tablet TAKE 1 TABLET ONCE A DAY FOR HIGH BLOOD PRESSURE  . Magnesium 400 MG CAPS Take by mouth 4 (four) times daily.  Marland Kitchen MEGARED OMEGA-3 KRILL OIL 500 MG CAPS Take 500 mg by mouth 2 (two) times daily.  . metFORMIN (GLUCOPHAGE) 1000 MG tablet TAKE (1) TABLET TWICE A DAY WITH MEALS (BREAKFAST AND SUPPER)  . Multiple Vitamins-Minerals (ICAPS PO) Take by mouth daily.  Marland Kitchen NOVOLIN 70/30 RELION (70-30) 100 UNIT/ML injection INJECT AS DIRECTED  . OVER THE COUNTER MEDICATION as needed. Macular Degeneration refresh eye drops  . oxyCODONE-acetaminophen (PERCOCET) 10-325 MG tablet Take 1 tablet by mouth every 8 (eight) hours as needed for pain.  . pravastatin (PRAVACHOL) 40 MG tablet TAKE 1 TABLET DAILY FOR CHOLESTEROL  . ranitidine (ZANTAC) 300 MG tablet TAKE 1 TABLET AT BEDTIME  FOR ACID REFLUX AND HEART BURN  . sulfamethoxazole-trimethoprim (BACTRIM DS,SEPTRA DS) 800-160 MG tablet Take 1 tablet 2 x /day with food for UTI   No current facility-administered medications on file prior to visit.      Allergies:  Allergies  Allergen Reactions  . Detrol [Tolterodine]      Medical History:  Past Medical History:  Diagnosis Date  . Arthritis   . GERD (gastroesophageal reflux disease)   . HOH (hard of hearing)   . Hyperlipidemia   . Hypertension   . Hypothyroid   . IDDM (insulin dependent diabetes mellitus) ([place])   . Leg swelling   . Macular degeneration   . Shortness of breath dyspnea    with exertion  . Sleep apnea    does not wear CPAP  . Varicose veins   . Wears glasses    Family history- Reviewed and unchanged Social history- Reviewed and unchanged   Review  of Systems:  Review of Systems  Constitutional: Negative for malaise/fatigue and weight loss.  HENT: Negative for hearing loss and tinnitus.   Eyes: Negative for blurred vision and double vision.  Respiratory: Positive for shortness of breath (Mild, with exertion, at baseline). Negative for cough and wheezing.   Cardiovascular: Negative for chest pain, palpitations, orthopnea, claudication and leg swelling.  Gastrointestinal: Negative for abdominal pain, blood in stool, constipation, diarrhea, heartburn, melena, nausea and vomiting.  Genitourinary: Negative.   Musculoskeletal: Positive for joint pain. Negative for myalgias.  Skin: Negative for rash.  Neurological: Positive for tingling. Negative for dizziness, sensory change, weakness and headaches.  Endo/Heme/Allergies: Negative for polydipsia.  Psychiatric/Behavioral: Negative.   All other systems reviewed and are negative.   Physical Exam: There were no vitals taken for this visit. Wt Readings from Last 3 Encounters:  07/07/18 199 lb 9.6 oz (90.5 kg)  05/29/18 201 lb (91.2 kg)  02/19/18 203 lb (92.1 kg)   General Appearance: Well nourished, obese, in no apparent distress. Eyes: PERRLA, EOMs, conjunctiva no swelling or erythema Sinuses: No Frontal/maxillary tenderness ENT/Mouth: Ext aud canals clear, TMs without erythema, bulging. No erythema, swelling, or exudate on post pharynx.  Tonsils not swollen or erythematous. Hearing normal.  Neck: Supple, thyroid normal.  Respiratory: Respiratory effort normal, BS equal bilaterally without rales, rhonchi, wheezing or stridor.  Cardio: RRR with no MRGs. She has nonpitting edema of bilateral ankles - per patient at baseline for her.  Abdomen: Soft, + BS.  Non tender, no guarding, rebound, no palpable hernias, masses. Lymphatics: Non tender without lymphadenopathy.  Musculoskeletal:  symmetrical strength, slow gait with cane Skin: Warm, dry without rashes, lesions, ecchymosis.  Neuro:  Cranial nerves intact. No cerebellar symptoms.  Psych: Awake and oriented X 3, normal affect, Insight and Judgment appropriate.    Vicie Mutters, PA-C 8:02 PM [facility]

## 2018-09-03 ENCOUNTER — Ambulatory Visit: Payer: Self-pay | Admitting: Physician Assistant

## 2018-09-04 ENCOUNTER — Ambulatory Visit: Payer: Self-pay | Admitting: Adult Health Nurse Practitioner

## 2018-09-07 DIAGNOSIS — H3562 Retinal hemorrhage, left eye: Secondary | ICD-10-CM | POA: Diagnosis not present

## 2018-09-07 DIAGNOSIS — H353221 Exudative age-related macular degeneration, left eye, with active choroidal neovascularization: Secondary | ICD-10-CM | POA: Diagnosis not present

## 2018-09-07 DIAGNOSIS — H43812 Vitreous degeneration, left eye: Secondary | ICD-10-CM | POA: Diagnosis not present

## 2018-09-22 ENCOUNTER — Encounter: Payer: Self-pay | Admitting: Adult Health

## 2018-09-22 ENCOUNTER — Ambulatory Visit (INDEPENDENT_AMBULATORY_CARE_PROVIDER_SITE_OTHER): Payer: Medicare Other | Admitting: Adult Health

## 2018-09-22 VITALS — BP 110/50 | HR 84 | Temp 97.7°F | Ht 63.5 in | Wt 206.0 lb

## 2018-09-22 DIAGNOSIS — N390 Urinary tract infection, site not specified: Secondary | ICD-10-CM

## 2018-09-22 DIAGNOSIS — R300 Dysuria: Secondary | ICD-10-CM | POA: Diagnosis not present

## 2018-09-22 MED ORDER — SULFAMETHOXAZOLE-TRIMETHOPRIM 800-160 MG PO TABS: ORAL_TABLET | ORAL | 0 refills | Status: DC

## 2018-09-22 NOTE — Progress Notes (Signed)
Assessment and Plan:  Catherine Wood was seen today for acute visit.  Diagnoses and all orders for this visit:  Dysuria/ Urinary tract infection without hematuria, site unspecified UTI versus OAB versus vaginal dryness - will check UA, C&S. Will start ABX now due to pain, culture may not be accurate due to azo use, emphasized hydration, follow up UA/culture in 3-4 weeks -     Urinalysis w microscopic + reflex cultur -     sulfamethoxazole-trimethoprim (BACTRIM DS,SEPTRA DS) 800-160 MG tablet; Take 1 tablet 2 x /day with food for UTI  Further disposition pending results of labs. Discussed med's effects and SE's.   Over 15 minutes of exam, counseling, chart review, and critical decision making was performed.   Future Appointments  Date Time Provider [place]  12/03/2018  2:30 PM [name], MD [facility] None  03/01/2019 11:15 AM Liane Comber, NP [facility] None  06/28/2019 10:00 AM [name], MD [facility] None    ------------------------------------------------------------------------------------------------------------------   HPI BP (!) 110/50   Pulse 84   Temp 97.7 F (36.5 C)   Ht 5' 3.5" (1.613 m)   Wt 206 lb (93.4 kg)   SpO2 94%   BMI 35.92 kg/m   85 y.o.female with T2DM, htn, CKD 3, hx of recurrent UTIs presents for evaluation of dysuria; she reports she noted dysuria ongoing persistently for 3 days. She reports sensation of urgency, frequency, she reports dark urine color, with strong odor, denies hematuria, denies rashes or vaginal discharge. She has not been sexually active in several years. She denies fever/chills, back/flank pains.   She has urge incontinence at baseline, wears a pad, no loss of bladder control in the last 3 days  She has been taking OTC azo for dysuria which was somewhat helpful, has been pushing fluids but admits she gets in only about 3 glasses  She has hx of frequent reported UTI symptoms, most recently had positive culture in  07/07/2018 for coagulase neg staph infection resistant to cipro, levo and oxacillin. Showed sensitive to nitrofurantoin, trimeth/sulfa and vanc. She was treated by batrim BID x 2 weeks. She does have CKD with most recent creatinine:  Lab Results  Component Value Date   CREATININE 1.03 (H) 05/29/2018     Past Medical History:  Diagnosis Date  . Arthritis   . GERD (gastroesophageal reflux disease)   . HOH (hard of hearing)   . Hyperlipidemia   . Hypertension   . Hypothyroid   . IDDM (insulin dependent diabetes mellitus) ([place])   . Leg swelling   . Macular degeneration   . Shortness of breath dyspnea    with exertion  . Sleep apnea    does not wear CPAP  . Varicose veins   . Wears glasses      Allergies  Allergen Reactions  . Detrol [Tolterodine]     Current Outpatient Medications on File Prior to Visit  Medication Sig  . ACCU-CHEK SOFTCLIX LANCETS lancets CHECK BLOOD SUGER UP TO 3 TIMES A DAY  . ALPRAZolam (XANAX) 0.5 MG tablet Take 1/2 to 1 tablet if need for sleep.  Marland Kitchen aspirin 81 MG tablet Take 81 mg by mouth daily.  . Calcium Carbonate (CALCIUM 600 PO) Take 600 mg by mouth daily.  . Cholecalciferol (VITAMIN D3) 5000 UNITS CAPS Take 5,000 Units by mouth daily.   . cyclobenzaprine (FLEXERIL) 10 MG tablet Take 1 tablet (10 mg total) by mouth 2 (two) times daily as needed for muscle spasms.  . diclofenac sodium (VOLTAREN) 1 %  GEL Apply 4 g topically 4 (four) times daily.  . fenofibrate 160 MG tablet TAKE 1 TABLET DAILY  . fenofibrate 160 MG tablet TAKE 1 TABLET DAILY  . furosemide (LASIX) 40 MG tablet TAKE 1 OR 2 TABLETS DAILY AS NEEDED FOR SWELLING/EDEMA  . gabapentin (NEURONTIN) 300 MG capsule 1 CAPSULE EACH MORNING, 2 EACH AFTERNOON, AND 2 AT BEDTIME  . Insulin Pen Needle (PEN NEEDLES 31GX5/16") 31G X 8 MM MISC USE 4 TIMES A DAY  . levothyroxine (SYNTHROID, LEVOTHROID) 100 MCG tablet TAKE (1) TABLET DAILY BE- FORE BREAKFAST.  Marland Kitchen losartan (COZAAR) 100 MG tablet TAKE 1 TABLET  ONCE A DAY FOR HIGH BLOOD PRESSURE  . Magnesium 400 MG CAPS Take by mouth 4 (four) times daily.  Marland Kitchen MEGARED OMEGA-3 KRILL OIL 500 MG CAPS Take 500 mg by mouth 2 (two) times daily.  . metFORMIN (GLUCOPHAGE) 1000 MG tablet TAKE (1) TABLET TWICE A DAY WITH MEALS (BREAKFAST AND SUPPER)  . Multiple Vitamins-Minerals (ICAPS PO) Take by mouth daily.  Marland Kitchen NOVOLIN 70/30 RELION (70-30) 100 UNIT/ML injection INJECT AS DIRECTED  . OVER THE COUNTER MEDICATION as needed. Macular Degeneration refresh eye drops  . oxyCODONE-acetaminophen (PERCOCET) 10-325 MG tablet Take 1 tablet by mouth every 8 (eight) hours as needed for pain.  . pravastatin (PRAVACHOL) 40 MG tablet TAKE 1 TABLET DAILY FOR CHOLESTEROL  . ranitidine (ZANTAC) 300 MG tablet TAKE 1 TABLET AT BEDTIME FOR ACID REFLUX AND HEART BURN   No current facility-administered medications on file prior to visit.     ROS: all negative except above.   Physical Exam:  BP (!) 110/50   Pulse 84   Temp 97.7 F (36.5 C)   Ht 5' 3.5" (1.613 m)   Wt 206 lb (93.4 kg)   SpO2 94%   BMI 35.92 kg/m   General Appearance: Well nourished, well dressed, in no apparent distress. Eyes: PERRLA, conjunctiva no swelling or erythema ENT/Mouth: No erythema, swelling, or exudate on post pharynx.  Tonsils not swollen or erythematous. Hearing normal.  Neck: Supple Respiratory: Respiratory effort normal, BS equal bilaterally without rales, rhonchi, wheezing or stridor.  Cardio: RRR with no MRGs. Brisk peripheral pulses without edema.  Abdomen: Soft, + BS.  Non tender, no guarding, rebound, hernias, masses. Lymphatics: Non tender without lymphadenopathy.  Musculoskeletal: No obvious deforemity, symmetrical strength, slow gait with walker. No CVA tenderness Skin: Warm, dry without rashes, lesions, ecchymosis.  Neuro: Cranial nerves intact. Normal muscle tone,  Psych: Awake and oriented X 3, normal affect, Insight and Judgment appropriate.     Izora Ribas, NP 2:58  PM [facility]

## 2018-09-22 NOTE — Patient Instructions (Signed)
Urinary Tract Infection, Adult  A urinary tract infection (UTI) is an infection of any part of the urinary tract. The urinary tract includes the kidneys, ureters, bladder, and urethra. These organs make, store, and get rid of urine in the body. Your health care provider may use other names to describe the infection. An upper UTI affects the ureters and kidneys (pyelonephritis). A lower UTI affects the bladder (cystitis) and urethra (urethritis). What are the causes? Most urinary tract infections are caused by bacteria in your genital area, around the entrance to your urinary tract (urethra). These bacteria grow and cause inflammation of your urinary tract. What increases the risk? You are more likely to develop this condition if:  You have a urinary catheter that stays in place (indwelling).  You are not able to control when you urinate or have a bowel movement (you have incontinence).  You are female and you: ? Use a spermicide or diaphragm for birth control. ? Have low estrogen levels. ? Are pregnant.  You have certain genes that increase your risk (genetics).  You are sexually active.  You take antibiotic medicines.  You have a condition that causes your flow of urine to slow down, such as: ? An enlarged prostate, if you are female. ? Blockage in your urethra (stricture). ? A kidney stone. ? A nerve condition that affects your bladder control (neurogenic bladder). ? Not getting enough to drink, or not urinating often.  You have certain medical conditions, such as: ? Diabetes. ? A weak disease-fighting system (immunesystem). ? Sickle cell disease. ? Gout. ? Spinal cord injury. What are the signs or symptoms? Symptoms of this condition include:  Needing to urinate right away (urgently).  Frequent urination or passing small amounts of urine frequently.  Pain or burning with urination.  Blood in the urine.  Urine that smells bad or unusual.  Trouble urinating.  Cloudy  urine.  Vaginal discharge, if you are female.  Pain in the abdomen or the lower back. You may also have:  Vomiting or a decreased appetite.  Confusion.  Irritability or tiredness.  A fever.  Diarrhea. The first symptom in older adults may be confusion. In some cases, they may not have any symptoms until the infection has worsened. How is this diagnosed? This condition is diagnosed based on your medical history and a physical exam. You may also have other tests, including:  Urine tests.  Blood tests.  Tests for sexually transmitted infections (STIs). If you have had more than one UTI, a cystoscopy or imaging studies may be done to determine the cause of the infections. How is this treated? Treatment for this condition includes:  Antibiotic medicine.  Over-the-counter medicines to treat discomfort.  Drinking enough water to stay hydrated. If you have frequent infections or have other conditions such as a kidney stone, you may need to see a health care provider who specializes in the urinary tract (urologist). In rare cases, urinary tract infections can cause sepsis. Sepsis is a life-threatening condition that occurs when the body responds to an infection. Sepsis is treated in the hospital with IV antibiotics, fluids, and other medicines. Follow these instructions at home:  Medicines  Take over-the-counter and prescription medicines only as told by your health care provider.  If you were prescribed an antibiotic medicine, take it as told by your health care provider. Do not stop using the antibiotic even if you start to feel better. General instructions  Make sure you: ? Empty your bladder often and   completely. Do not hold urine for long periods of time. ? Empty your bladder after sex. ? Wipe from front to back after a bowel movement if you are female. Use each tissue one time when you wipe.  Drink enough fluid to keep your urine pale yellow.  Keep all follow-up  visits as told by your health care provider. This is important. Contact a health care provider if:  Your symptoms do not get better after 1-2 days.  Your symptoms go away and then return. Get help right away if you have:  Severe pain in your back or your lower abdomen.  A fever.  Nausea or vomiting. Summary  A urinary tract infection (UTI) is an infection of any part of the urinary tract, which includes the kidneys, ureters, bladder, and urethra.  Most urinary tract infections are caused by bacteria in your genital area, around the entrance to your urinary tract (urethra).  Treatment for this condition often includes antibiotic medicines.  If you were prescribed an antibiotic medicine, take it as told by your health care provider. Do not stop using the antibiotic even if you start to feel better.  Keep all follow-up visits as told by your health care provider. This is important. This information is not intended to replace advice given to you by your health care provider. Make sure you discuss any questions you have with your health care provider. Document Released: 06/05/2005 Document Revised: 03/05/2018 Document Reviewed: 03/05/2018 Elsevier Interactive Patient Education  2019 [place].     Sulfamethoxazole; Trimethoprim, SMX-TMP tablets What is this medicine? SULFAMETHOXAZOLE; TRIMETHOPRIM or SMX-TMP (suhl fuh meth OK suh zohl; trye METH oh prim) is a combination of a sulfonamide antibiotic and a second antibiotic, trimethoprim. It is used to treat or prevent certain kinds of bacterial infections. It will not work for colds, flu, or other viral infections. This medicine may be used for other purposes; ask your health care provider or pharmacist if you have questions. COMMON BRAND NAME(S): Bacter-Aid DS, Bactrim, Bactrim DS, Septra, Septra DS What should I tell my health care provider before I take this medicine? They need to know if you have any of these  conditions: -anemia -asthma -being treated with anticonvulsants -if you frequently drink alcohol containing drinks -kidney disease -liver disease -low level of folic acid or [id number] dehydrogenase -poor nutrition or malabsorption -porphyria -severe allergies -thyroid disorder -an unusual or allergic reaction to sulfamethoxazole, trimethoprim, sulfa drugs, other medicines, foods, dyes, or preservatives -pregnant or trying to get pregnant -breast-feeding How should I use this medicine? Take this medicine by mouth with a full glass of water. Follow the directions on the prescription label. Take your medicine at regular intervals. Do not take it more often than directed. Do not skip doses or stop your medicine early. Talk to your pediatrician regarding the use of this medicine in children. Special care may be needed. This medicine has been used in children as young as 2 months of age. Overdosage: If you think you have taken too much of this medicine contact a poison control center or emergency room at once. NOTE: This medicine is only for you. Do not share this medicine with others. What if I miss a dose? If you miss a dose, take it as soon as you can. If it is almost time for your next dose, take only that dose. Do not take double or extra doses. What may interact with this medicine? Do not take this medicine with any of the following medications: -  aminobenzoate potassium -dofetilide -metronidazole This medicine may also interact with the following medications: -ACE inhibitors like benazepril, enalapril, lisinopril, and ramipril -birth control pills -cyclosporine -digoxin -diuretics -indomethacin -medicines for diabetes -methenamine -methotrexate -phenytoin -potassium supplements -pyrimethamine -sulfinpyrazone -tricyclic antidepressants -warfarin This list may not describe all possible interactions. Give your health care provider a list of all the medicines, herbs,  non-prescription drugs, or dietary supplements you use. Also tell them if you smoke, drink alcohol, or use illegal drugs. Some items may interact with your medicine. What should I watch for while using this medicine? Tell your doctor or health care professional if your symptoms do not improve. Drink several glasses of water a day to reduce the risk of kidney problems. Do not treat diarrhea with over the counter products. Contact your doctor if you have diarrhea that lasts more than 2 days or if it is severe and watery. This medicine can make you more sensitive to the sun. Keep out of the sun. If you cannot avoid being in the sun, wear protective clothing and use a sunscreen. Do not use sun lamps or tanning beds/booths. What side effects may I notice from receiving this medicine? Side effects that you should report to your doctor or health care professional as soon as possible: -allergic reactions like skin rash or hives, swelling of the face, lips, or tongue -breathing problems -fever or chills, sore throat -irregular heartbeat, chest pain -joint or muscle pain -pain or difficulty passing urine -red pinpoint spots on skin -redness, blistering, peeling or loosening of the skin, including inside the mouth -unusual bleeding or bruising -unusually weak or tired -yellowing of the eyes or skin Side effects that usually do not require medical attention (report to your doctor or health care professional if they continue or are bothersome): -diarrhea -dizziness -headache -loss of appetite -nausea, vomiting -nervousness This list may not describe all possible side effects. Call your doctor for medical advice about side effects. You may report side effects to FDA at 1-800-FDA-1088. Where should I keep my medicine? Keep out of the reach of children. Store at room temperature between 20 to 25 degrees C (68 to 77 degrees F). Protect from light. Throw away any unused medicine after the expiration  date. NOTE: This sheet is a summary. It may not cover all possible information. If you have questions about this medicine, talk to your doctor, pharmacist, or health care provider.  2019 Elsevier/Gold Standard (2013-04-02 14:38:26)

## 2018-09-24 LAB — URINALYSIS W MICROSCOPIC + REFLEX CULTURE
Bilirubin Urine: NEGATIVE
HYALINE CAST: NONE SEEN /LPF
Hgb urine dipstick: NEGATIVE
Ketones, ur: NEGATIVE
Nitrites, Initial: POSITIVE — AB
PH: 6.5 (ref 5.0–8.0)
Protein, ur: NEGATIVE
SPECIFIC GRAVITY, URINE: 1.013 (ref 1.001–1.03)
SQUAMOUS EPITHELIAL / LPF: NONE SEEN /HPF (ref <=5)
WBC, UA: >=60 /HPF — AB (ref 0–5)

## 2018-09-24 LAB — URINE CULTURE
MICRO NUMBER:: 58613
SPECIMEN QUALITY:: ADEQUATE

## 2018-09-24 LAB — CULTURE INDICATED

## 2018-09-29 ENCOUNTER — Other Ambulatory Visit: Payer: Self-pay | Admitting: Internal Medicine

## 2018-10-12 DIAGNOSIS — L821 Other seborrheic keratosis: Secondary | ICD-10-CM | POA: Diagnosis not present

## 2018-10-12 DIAGNOSIS — L570 Actinic keratosis: Secondary | ICD-10-CM | POA: Diagnosis not present

## 2018-10-12 DIAGNOSIS — L820 Inflamed seborrheic keratosis: Secondary | ICD-10-CM | POA: Diagnosis not present

## 2018-10-13 ENCOUNTER — Other Ambulatory Visit: Payer: Self-pay | Admitting: Adult Health

## 2018-10-13 ENCOUNTER — Other Ambulatory Visit: Payer: Self-pay | Admitting: Internal Medicine

## 2018-10-13 ENCOUNTER — Ambulatory Visit (INDEPENDENT_AMBULATORY_CARE_PROVIDER_SITE_OTHER): Payer: Medicare Other

## 2018-10-13 VITALS — BP 118/74 | HR 81 | Temp 97.9°F | Ht 63.5 in | Wt 209.6 lb

## 2018-10-13 DIAGNOSIS — N390 Urinary tract infection, site not specified: Secondary | ICD-10-CM

## 2018-10-13 NOTE — Addendum Note (Signed)
Addended by: Eulis Canner on: 10/13/2018 02:54 PM   Modules accepted: Orders

## 2018-10-13 NOTE — Progress Notes (Signed)
Reports for LAB----to recheck urine Reports that she has finished the ABX but still has some burning while voiding off and on. Pt would like another ABX called into pharmacy. Vitals taken at intake & entered.

## 2018-10-14 LAB — URINALYSIS, ROUTINE W REFLEX MICROSCOPIC
Bilirubin Urine: NEGATIVE
Hgb urine dipstick: NEGATIVE
Ketones, ur: NEGATIVE
LEUKOCYTES UA: NEGATIVE
Nitrite: NEGATIVE
Protein, ur: NEGATIVE
Specific Gravity, Urine: 1.008 (ref 1.001–1.03)
pH: 7 (ref 5.0–8.0)

## 2018-10-14 LAB — URINE CULTURE
MICRO NUMBER:: 149991
Result:: NO GROWTH
SPECIMEN QUALITY:: ADEQUATE

## 2018-10-15 DIAGNOSIS — H3562 Retinal hemorrhage, left eye: Secondary | ICD-10-CM | POA: Diagnosis not present

## 2018-10-15 DIAGNOSIS — H353221 Exudative age-related macular degeneration, left eye, with active choroidal neovascularization: Secondary | ICD-10-CM | POA: Diagnosis not present

## 2018-10-15 DIAGNOSIS — H43812 Vitreous degeneration, left eye: Secondary | ICD-10-CM | POA: Diagnosis not present

## 2018-10-21 ENCOUNTER — Ambulatory Visit (INDEPENDENT_AMBULATORY_CARE_PROVIDER_SITE_OTHER): Payer: Medicare Other

## 2018-10-21 VITALS — BP 158/68 | HR 83 | Temp 97.9°F | Ht 63.5 in | Wt 209.6 lb

## 2018-10-21 DIAGNOSIS — R300 Dysuria: Secondary | ICD-10-CM

## 2018-10-21 DIAGNOSIS — N390 Urinary tract infection, site not specified: Secondary | ICD-10-CM

## 2018-10-21 MED ORDER — SULFAMETHOXAZOLE-TRIMETHOPRIM 800-160 MG PO TABS: ORAL_TABLET | ORAL | 0 refills | Status: DC

## 2018-10-21 NOTE — Progress Notes (Signed)
Patient presents to the office for a nurse visit to have urine checked for signs of having a UTI. Patient complaining of dysuria and frequency, starting last night.

## 2018-10-21 NOTE — Progress Notes (Signed)
  Lab Results  Component Value Date   GFRNONAA 50 (L) 05/29/2018

## 2018-10-24 LAB — URINALYSIS, ROUTINE W REFLEX MICROSCOPIC
BACTERIA UA: NONE SEEN /HPF
Bilirubin Urine: NEGATIVE
Hyaline Cast: NONE SEEN /LPF
Ketones, ur: NEGATIVE
Nitrite: NEGATIVE
SQUAMOUS EPITHELIAL / LPF: NONE SEEN /HPF (ref <=5)
Specific Gravity, Urine: 1.012 (ref 1.001–1.03)
WBC, UA: >=60 /HPF — AB (ref 0–5)
pH: 8 (ref 5.0–8.0)

## 2018-10-24 LAB — URINE CULTURE
MICRO NUMBER:: 187800
SPECIMEN QUALITY:: ADEQUATE

## 2018-10-25 ENCOUNTER — Other Ambulatory Visit: Payer: Self-pay | Admitting: Physician Assistant

## 2018-10-25 MED ORDER — NITROFURANTOIN MONOHYD MACRO 100 MG PO CAPS: 100.0000 mg | ORAL_CAPSULE | Freq: Two times a day (BID) | ORAL | 0 refills | Status: DC

## 2018-10-25 NOTE — Progress Notes (Signed)
  Lab Results  Component Value Date   GFRNONAA 50 (L) 05/29/2018

## 2018-11-05 DIAGNOSIS — L84 Corns and callosities: Secondary | ICD-10-CM | POA: Diagnosis not present

## 2018-11-05 DIAGNOSIS — M79676 Pain in unspecified toe(s): Secondary | ICD-10-CM | POA: Diagnosis not present

## 2018-11-05 DIAGNOSIS — E1142 Type 2 diabetes mellitus with diabetic polyneuropathy: Secondary | ICD-10-CM | POA: Diagnosis not present

## 2018-11-05 DIAGNOSIS — B351 Tinea unguium: Secondary | ICD-10-CM | POA: Diagnosis not present

## 2018-11-19 DIAGNOSIS — H3562 Retinal hemorrhage, left eye: Secondary | ICD-10-CM | POA: Diagnosis not present

## 2018-11-19 DIAGNOSIS — H43812 Vitreous degeneration, left eye: Secondary | ICD-10-CM | POA: Diagnosis not present

## 2018-11-19 DIAGNOSIS — H353221 Exudative age-related macular degeneration, left eye, with active choroidal neovascularization: Secondary | ICD-10-CM | POA: Diagnosis not present

## 2018-11-27 ENCOUNTER — Other Ambulatory Visit: Payer: Self-pay | Admitting: Adult Health

## 2018-12-03 ENCOUNTER — Other Ambulatory Visit: Payer: Self-pay | Admitting: Adult Health

## 2018-12-03 ENCOUNTER — Other Ambulatory Visit: Payer: Self-pay | Admitting: Internal Medicine

## 2018-12-03 ENCOUNTER — Ambulatory Visit: Payer: Self-pay | Admitting: Internal Medicine

## 2018-12-03 DIAGNOSIS — E1122 Type 2 diabetes mellitus with diabetic chronic kidney disease: Secondary | ICD-10-CM

## 2018-12-03 DIAGNOSIS — N183 Chronic kidney disease, stage 3 (moderate): Principal | ICD-10-CM

## 2018-12-03 MED ORDER — INSULIN NPH ISOPHANE & REGULAR (70-30) 100 UNIT/ML ~~LOC~~ SUSP: SUBCUTANEOUS | 3 refills | Status: DC

## 2018-12-04 ENCOUNTER — Other Ambulatory Visit: Payer: Self-pay | Admitting: Adult Health

## 2018-12-04 DIAGNOSIS — E1122 Type 2 diabetes mellitus with diabetic chronic kidney disease: Secondary | ICD-10-CM

## 2018-12-04 DIAGNOSIS — N183 Chronic kidney disease, stage 3 (moderate): Principal | ICD-10-CM

## 2018-12-19 IMAGING — MG DIGITAL SCREENING BILATERAL MAMMOGRAM WITH TOMO AND CAD
8 series · 8 of 24 positions shown · non-contrast
Comparison: Previous exam(s).

CLINICAL DATA: Screening.

EXAM:
DIGITAL SCREENING BILATERAL MAMMOGRAM WITH TOMO AND CAD

[L MLO synth-2D]
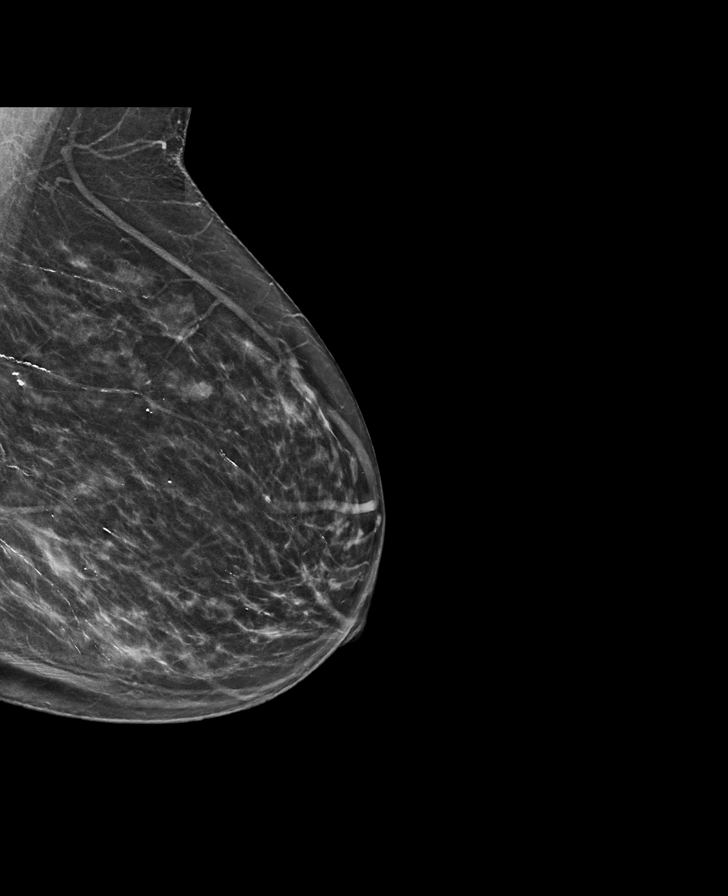

[R CC synth-2D]
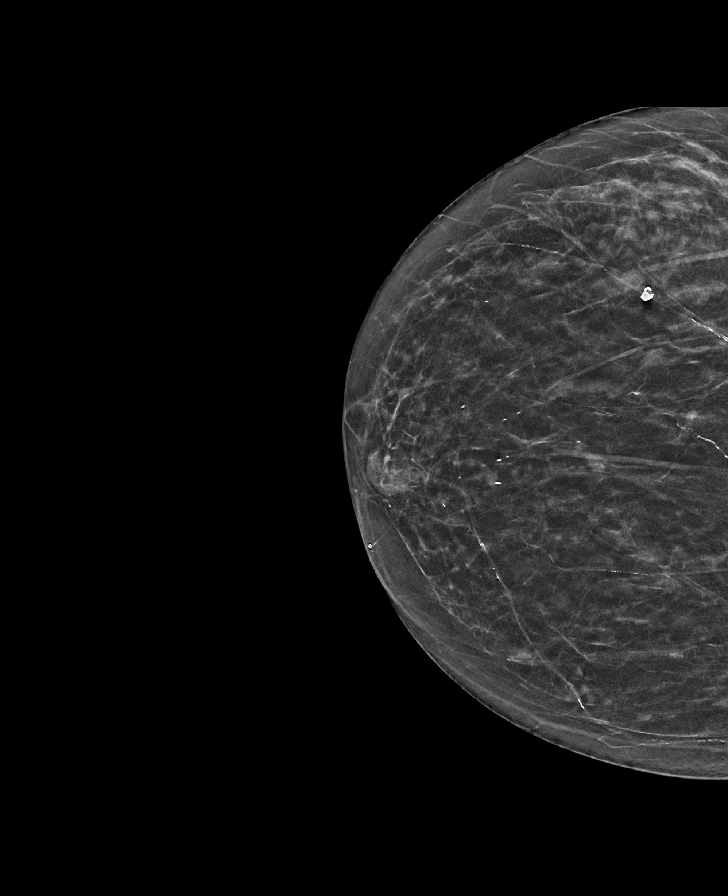

[R MLO synth-2D]
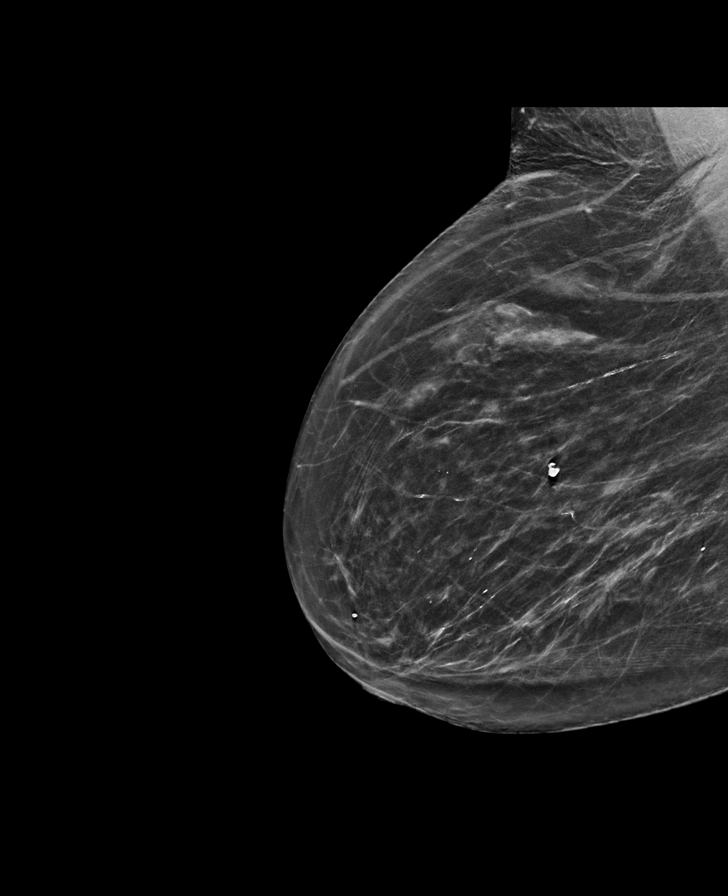

[L CC synth-2D]
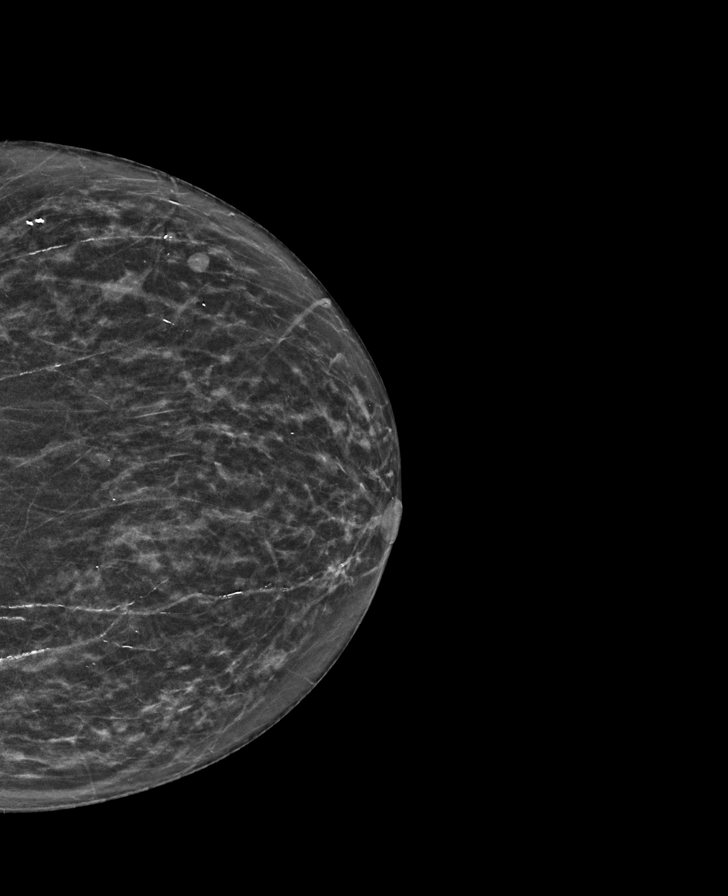

[L MLO tomo · tomo slice 30/59.0]
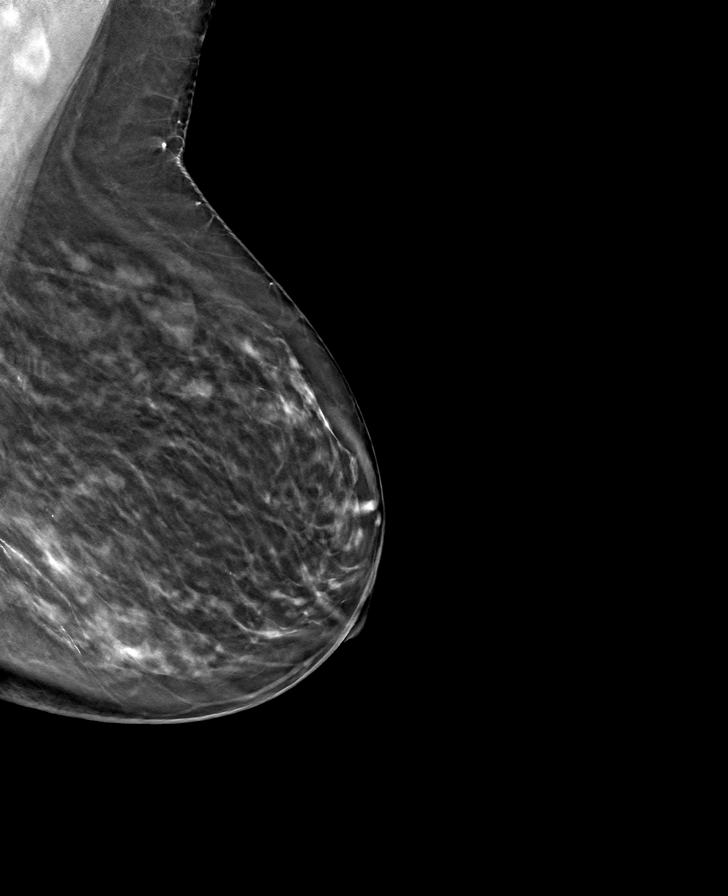

[R CC tomo · tomo slice 25/48.0]
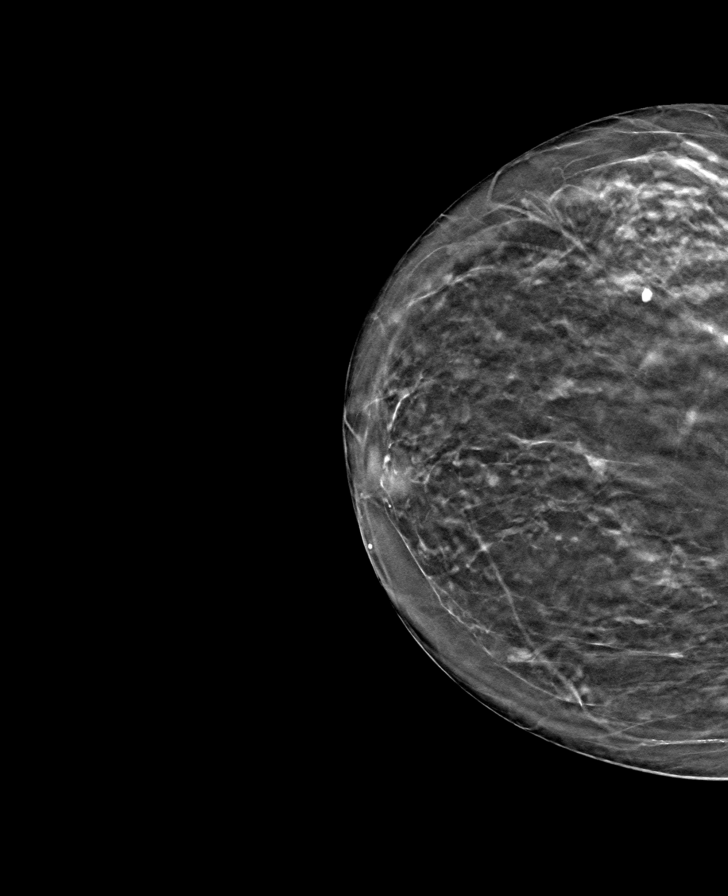

[L CC tomo · tomo slice 24/47.0]
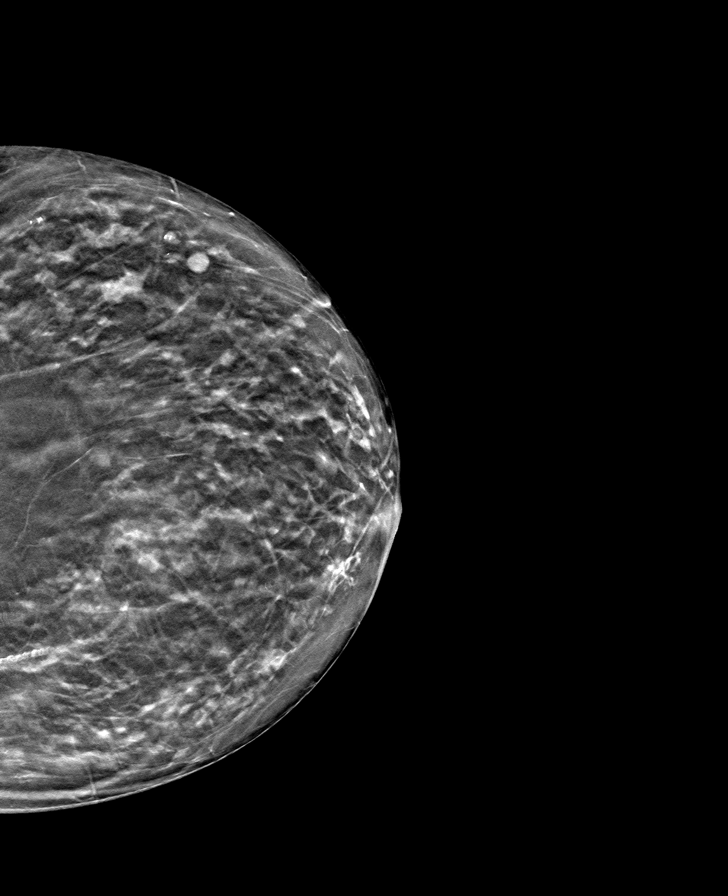

[R MLO tomo · tomo slice 33/64.0]
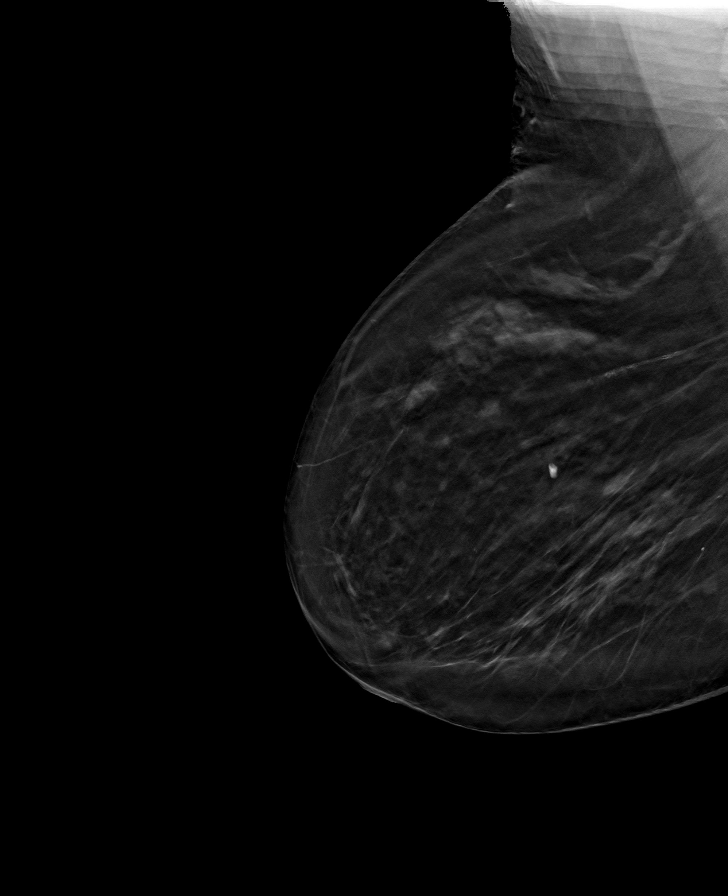

[8 of 24 positions shown; findings below may reference images not displayed]

ACR Breast Density Category b: There are scattered areas of
fibroglandular density.
FINDINGS: There are no findings suspicious for malignancy. Images were
processed with CAD.
IMPRESSION: No mammographic evidence of malignancy. A result letter of this
screening mammogram will be mailed directly to the patient.

RECOMMENDATION:
Screening mammogram in one year. (Code:[id number])

BI-RADS CATEGORY  1: Negative.

## 2019-01-04 NOTE — Progress Notes (Signed)
Virtual Visit via Telephone Note  I connected with Catherine Wood on 01/05/19 at  1:30 PM EDT by telephone and verified that I am speaking with the correct person using two identifiers.Catherine Wood, her daughter, answered the questions today.   I discussed the limitations, risks, security and privacy concerns of performing an evaluation and management service by telephone and the availability of in person appointments. I also discussed with the patient that there may be a patient responsible charge related to this service. The patient expressed understanding and agreed to proceed.   MEDICARE ANNUAL WELLNESS VISIT AND FOLLOW UP Assessment:   Catherine Wood was seen today for medicare wellness.  Diagnoses and all orders for this visit:  Medicare annual wellness visit, initial Yearly  Essential hypertension Hypertension Continue medication: Monitor blood pressure; call if consistently over 130/80 Continue DASH diet.   Reminder to go to the ER if any CP, SOB, nausea, dizziness, severe HA, changes vision/speech, left arm numbness and tingling and jaw pain.  Mixed hyperlipidemia Cholesterol Continue medications:pravachol 40mg  Continue low cholesterol diet and exercise.  Check lipid panel.  -     pravastatin (PRAVACHOL) 40 MG tablet; Take 1 tablet (40 mg total) by mouth daily. for cholesterol.  Take at bedtime.  CKD stage 3 due to type 2 diabetes mellitus (HCC) Increase fluids, avoid NSAIDS, monitor sugars, will monitor  Hypothyroidism, unspecified type Taking levothyroxine 128mcg Education provided about when to take medicine. Take 30-30min first thing in am with water prior to food or other medications. Discussed with patient and daughter. Will check labs next office visit  Diabetic polyneuropathy associated with type 2 diabetes mellitus ([place]) Taking gabapentin & Duloxetine 30mg  Discussed taper to stop gabapentin as reports it is not helping. Discussed checking feet daily and well  fitting shoes  DDD (degenerative disc disease), lumbar Managing at this time Not taking any medications for this  Type 2 diabetes mellitus with stage 3 chronic kidney disease, with long-term current use of insulin (HCC) Patient taking 70/30 insulin Metformin 1000mg  twice a day Does not check glucose regularly Unsure of last time she checked. Discussed importance of this Discussed dietary and exercise modifications Discussed general issues about diabetes pathophysiology and management., Educational material distributed., Suggested low cholesterol diet., Encouraged aerobic exercise., Discussed foot care., Reminded to get yearly retinal exam.  Morbid obesity ([place]) Discussed dietary and exercise modifications  Macular degeneration, unspecified laterality, unspecified type Has had yearly retinal exam Dr Herbert Deaner & Dr Zadie Rhine for this  Vitamin D deficiency Continue supplementation Will check labs net office visit   Medication management continued    Follow Up Instructions:    I discussed the assessment and treatment plan with the patient. The patient was provided an opportunity to ask questions and all were answered. The patient agreed with the plan and demonstrated an understanding of the instructions.   The patient was advised to call back or seek an in-person evaluation if the symptoms worsen or if the condition fails to improve as anticipated.  I provided 30 minutes of non-face-to-face time during this encounter including counseling, chart review, and critical decision making was preformed.   Future Appointments  Date Time Provider [place]  06/28/2019 10:00 AM [name], MD [facility] None  01/18/2020  2:00 PM Garnet Sierras, NP [facility] None      Plan:   During the course of the visit the patient was educated and counseled about appropriate screening and preventive services including:    Pneumococcal vaccine   Influenza vaccine  Prevnar  13  Td vaccine  Screening electrocardiogram, deferred, telephone visit [place].  Colorectal cancer screening  Diabetes screening  Glaucoma screening  Nutrition counseling    Subjective:  Catherine Wood is a 85 y.o. female who presents for Medicare Annual Wellness Visit and 3 month follow up for HTN, hyperlipidemia, DMII, polyneuropathy, hypothyroidism and vitamin D Def.   Reports she is feeling nauseated 3-4 days with some loss of appetite.  She is having nausea that mainly occurs in the morning after taking her medications. Reports that she takes all of her medications at one time after breakfast.  She manages all of her medications her self. Daughter reports they are separated into baskets for morning and evening. She is taking gabapentin 300mg , prescribed one in morning, two in the afternoon and two tablets at bedtime.  She is currently taking one of these in the morning.  She reports that this is not helping her.  She recently was started on Duloxetine 30mg .  Patient reports this medication has helped.  Her daughter reports she is moving around better and has not heard her complain about her leg hurting her.  Her blood pressure has been controlled and she does not have a blood pressure cuff at home. She does not workout. She denies chest pain, shortness of breath, dizziness.  She is not on cholesterol medication and denies myalgias. Her cholesterol is not at goal. The cholesterol last visit was:   Lab Results  Component Value Date   CHOL 166 05/29/2018   HDL 50 (L) 05/29/2018   LDLCALC 72 05/29/2018   TRIG 365 (H) 05/29/2018   CHOLHDL 3.3 05/29/2018   She has been working on diet and exercise for DMII, and denies hyperglycemia, hypoglycemia , increased appetite, nausea, paresthesia of the feet, polydipsia, polyuria, visual disturbances and vomiting. Last A1C in the office was:  Lab Results  Component Value Date   HGBA1C 9.4 (H) 05/29/2018   Last GFR Lab Results  Component  Value Date   GFRNONAA 50 (L) 05/29/2018     Lab Results  Component Value Date   GFRAA 57 (L) 05/29/2018   Patient is on Vitamin D supplement.   Lab Results  Component Value Date   VD25OH 38 05/29/2018     She is on thyroid medication. Her medication was not changed last visit.  She is taking 128mcg daily. Lab Results  Component Value Date   TSH 4.12 05/29/2018  .  Medication Review:  Current Outpatient Medications (Endocrine & Metabolic):  .  levothyroxine (SYNTHROID, LEVOTHROID) 100 MCG tablet, TAKE (1) TABLET DAILY BE- FORE BREAKFAST. .  metFORMIN (GLUCOPHAGE) 1000 MG tablet, TAKE (1) TABLET TWICE A DAY WITH MEALS (BREAKFAST AND SUPPER) .  NOVOLIN 70/30 RELION (70-30) 100 UNIT/ML injection, USE AS DIRECTED  Current Outpatient Medications (Cardiovascular):  .  fenofibrate 160 MG tablet, TAKE 1 TABLET DAILY .  furosemide (LASIX) 40 MG tablet, TAKE 1 OR 2 TABLETS DAILY AS NEEDED FOR SWELLING/EDEMA .  losartan (COZAAR) 100 MG tablet, TAKE 1 TABLET ONCE A DAY FOR HIGH BLOOD PRESSURE .  pravastatin (PRAVACHOL) 40 MG tablet, Take 1 tablet (40 mg total) by mouth daily. for cholesterol.  Take at bedtime.   Current Outpatient Medications (Analgesics):  .  aspirin 81 MG tablet, Take 81 mg by mouth daily.   Current Outpatient Medications (Other):  Marland Kitchen  Calcium Carbonate (CALCIUM 600 PO), Take 600 mg by mouth daily. .  Cholecalciferol (VITAMIN D3) 5000 UNITS CAPS, Take 5,000 Units by mouth  daily.  .  DULoxetine (CYMBALTA) 30 MG capsule, Take 30 mg by mouth daily. Marland Kitchen  gabapentin (NEURONTIN) 300 MG capsule, 1 CAPSULE EACH MORNING, 2 EACH AFTERNOON, AND 2 AT BEDTIME .  Insulin Pen Needle (PEN NEEDLES 31GX5/16") 31G X 8 MM MISC, USE 4 TIMES A DAY .  Magnesium 400 MG CAPS, Take by mouth 4 (four) times daily. Marland Kitchen  MEGARED OMEGA-3 KRILL OIL 500 MG CAPS, Take 500 mg by mouth 2 (two) times daily. .  Multiple Vitamins-Minerals (ICAPS PO), Take by mouth daily. Marland Kitchen  OVER THE COUNTER MEDICATION, as  needed. Macular Degeneration refresh eye drops .  ACCU-CHEK SOFTCLIX LANCETS lancets, CHECK BLOOD SUGER UP TO 3 TIMES A DAY  Allergies: Allergies  Allergen Reactions  . Detrol [Tolterodine]     Current Problems (verified) has Hypertension; Hypothyroid; CKD stage 3 due to type 2 diabetes mellitus ([place]); Hyperlipidemia; Macular degeneration; Vitamin D deficiency; Medicare annual wellness visit, initial; Chronic venous insufficiency; Obesity (BMI 30.0-34.9); DDD (degenerative disc disease), lumbar; Cervical spondylosis with myelopathy; Diabetic polyneuropathy associated with type 2 diabetes mellitus ([place]); Uncontrolled type 2 diabetes mellitus ([place]); Type 2 diabetes mellitus with stage 3 chronic kidney disease, with long-term current use of insulin ([place]); and Morbid obesity ([place]) on their problem list.  Screening Tests Immunization History  Administered Date(s) Administered  . DT 10/20/2014  . Influenza Split 06/04/2017  . Influenza, High Dose Seasonal PF 07/01/2016, 05/29/2018  . Influenza-Unspecified 06/22/2013, 06/16/2014, 06/27/2014, 07/06/2015  . Pneumococcal Conjugate-13 04/05/2014  . Pneumococcal-Unspecified 09/09/2004  . Td 09/10/2003  . Zoster 09/09/2005    Preventative care: Last colonoscopy: 2016 Mammogram: 2019, Due 2020 DEXA: 2013 Prior vaccinations: TD or Tdap: 2016  Influenza: 2019 Pneumococcal: 2006 Prevnar13: 2015 Shingles/Zostavax: 2007  Names of Other Physician/Practitioners you currently use: 1. [facility] here for primary care 2. Dr Zadie Rhine 2020. 3. Dr Lynnette Caffey, 2020.  Patient Care Team: [name], MD as PCP - General (Internal Medicine) Clarene Essex, MD as Consulting Physician (Gastroenterology) Stanford Breed Denice Bors, MD as Consulting Physician (Cardiology) Monna Fam, MD as Consulting Physician (Ophthalmology) Zadie Rhine Clent Demark, MD as Consulting Physician (Ophthalmology) Deneise Lever, MD as Consulting  Physician (Pulmonary Disease)  Surgical: She  has a past surgical history that includes Biopsy thyroid; Varicose vein surgery; Cataract extraction, bilateral; Endometrial biopsy (2000); Colonoscopy w/ biopsies and polypectomy; Tubal ligation; Anterior cervical decomp/discectomy fusion (N/A, 11/08/2014); and Spine surgery (Left,  11/08/2014). Family Her family history includes Cancer in her father and mother. Social history  She reports that she has never smoked. She has never used smokeless tobacco. She reports that she does not drink alcohol or use drugs.  MEDICARE WELLNESS OBJECTIVES: Physical activity: Current Exercise Habits: The patient does not participate in regular exercise at present, Exercise limited by: neurologic condition(s) Cardiac risk factors: Cardiac Risk Factors include: advanced age (>45men, >68 women);hypertension;obesity (BMI >30kg/m2) Depression/mood screen:   Depression screen [facility] 2/9 01/05/2019  Decreased Interest 0  Down, Depressed, Hopeless 0  PHQ - 2 Score 0    ADLs:  In your present state of health, do you have any difficulty performing the following activities: 01/05/2019 05/30/2018  Hearing? N N  Comment - -  Vision? 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 N N  Walking or climbing stairs? N N  Comment - -  Dressing or bathing? N N  Doing errands, shopping? Y Y  Comment - requires transport  Preparing Food and eating ? N -  Using the Toilet? N -  In the past six months, have you accidently leaked urine? N -  Comment - -  Do you have problems with loss of bowel control? N -  Managing your Medications? N -  Managing your Finances? N -  Housekeeping or managing your Housekeeping? N -  Some recent data might be hidden     Cognitive Testing  Alert? Yes  Normal Appearance?Yes  Oriented to person? Yes  Place? Yes   Time? Yes  Recall of three objects?  Yes  Can perform simple calculations?  Yes  Displays appropriate judgment?Yes  Can read the correct time from a watch face?Yes  EOL planning: Does Patient Have a Medical Advance Directive?: Yes Type of Advance Directive: [place] Does patient want to make changes to medical advance directive?: No - Patient declined Copy of [place] in Chart?: No - copy requested   Objective:   Today's Vitals   01/05/19 1332  Weight: 200 lb (90.7 kg)   Body mass index is 34.87 kg/m.  General : Well sounding patient in no apparent distress HEENT: no hoarseness, no cough for duration of visit Lungs: speaks in complete sentences, no audible wheezing, no apparent distress Neurological: alert, oriented x 3 Psychiatric: pleasant, judgement appropriate   Medicare Attestation I have personally reviewed: The patient's medical and social history Their use of alcohol, tobacco or illicit drugs Their current medications and supplements The patient's functional ability including ADLs,fall risks, home safety risks, cognitive, and hearing and visual impairment Diet and physical activities Evidence for depression or mood disorders  The patient's weight, height, BMI, and visual acuity have been recorded in the chart.  I have made referrals, counseling, and provided education to the patient based on review of the above and I have provided the patient with a written personalized care plan for preventive services.     Garnet Sierras, NP [facility] 01/05/2019  13:40 PM

## 2019-01-05 ENCOUNTER — Encounter: Payer: Self-pay | Admitting: Adult Health Nurse Practitioner

## 2019-01-05 ENCOUNTER — Other Ambulatory Visit: Payer: Self-pay

## 2019-01-05 ENCOUNTER — Ambulatory Visit: Payer: Medicare Other | Admitting: Adult Health Nurse Practitioner

## 2019-01-05 VITALS — Ht 65.0 in | Wt 200.0 lb

## 2019-01-05 DIAGNOSIS — R6889 Other general symptoms and signs: Secondary | ICD-10-CM | POA: Diagnosis not present

## 2019-01-05 DIAGNOSIS — E782 Mixed hyperlipidemia: Secondary | ICD-10-CM

## 2019-01-05 DIAGNOSIS — M5136 Other intervertebral disc degeneration, lumbar region: Secondary | ICD-10-CM

## 2019-01-05 DIAGNOSIS — I10 Essential (primary) hypertension: Secondary | ICD-10-CM

## 2019-01-05 DIAGNOSIS — Z0001 Encounter for general adult medical examination with abnormal findings: Secondary | ICD-10-CM | POA: Diagnosis not present

## 2019-01-05 DIAGNOSIS — E039 Hypothyroidism, unspecified: Secondary | ICD-10-CM | POA: Diagnosis not present

## 2019-01-05 DIAGNOSIS — E1142 Type 2 diabetes mellitus with diabetic polyneuropathy: Secondary | ICD-10-CM

## 2019-01-05 DIAGNOSIS — M51369 Other intervertebral disc degeneration, lumbar region without mention of lumbar back pain or lower extremity pain: Secondary | ICD-10-CM

## 2019-01-05 DIAGNOSIS — N183 Chronic kidney disease, stage 3 unspecified: Secondary | ICD-10-CM

## 2019-01-05 DIAGNOSIS — E1122 Type 2 diabetes mellitus with diabetic chronic kidney disease: Secondary | ICD-10-CM | POA: Diagnosis not present

## 2019-01-05 DIAGNOSIS — Z0000 Encounter for general adult medical examination without abnormal findings: Secondary | ICD-10-CM

## 2019-01-05 DIAGNOSIS — H3530 Unspecified macular degeneration: Secondary | ICD-10-CM

## 2019-01-05 DIAGNOSIS — Z794 Long term (current) use of insulin: Secondary | ICD-10-CM | POA: Insufficient documentation

## 2019-01-05 DIAGNOSIS — Z79899 Other long term (current) drug therapy: Secondary | ICD-10-CM

## 2019-01-05 DIAGNOSIS — E559 Vitamin D deficiency, unspecified: Secondary | ICD-10-CM

## 2019-01-05 DIAGNOSIS — N1832 Chronic kidney disease, stage 3b: Secondary | ICD-10-CM | POA: Insufficient documentation

## 2019-01-05 MED ORDER — PRAVASTATIN SODIUM 40 MG PO TABS: 40.0000 mg | ORAL_TABLET | Freq: Every day | ORAL | 0 refills | Status: DC

## 2019-01-05 NOTE — Patient Instructions (Addendum)
Catherine Wood , Thank you for taking time to come for your Medicare Wellness Visit. I appreciate your ongoing commitment to your health goals. Please review the following plan we discussed and let me know if I can assist you in the future.    This is a list of the screening recommended for you and due dates:  Health Maintenance  Topic Date Due  . Eye exam for diabetics  06/16/2018  . Hemoglobin A1C  11/27/2018  . Flu Shot  04/10/2019  . Complete foot exam   05/30/2019  . Tetanus Vaccine  10/20/2024  . DEXA scan (bone density measurement)  Completed  . Pneumonia vaccines  Completed    Your health screening are up to date.  We will check you labs at your next office visit.   Change the way you are taking your Levothyroxine, Synthroid.  Take this 30-63mn before breakfast in the morning with water only.  Change the way you are taking Pravastatin, Pravachol.  Take this at bedtime.  You can stop taking the gabapentin if you feel like this is not working for you.  Take this medication every other day for one week then stop, completely.   *If a medication is not helping you, save yourself the money and side effects!  Check your blood glucose fasting in the morning.  Also check your blood sugar when you are feeling bad.  Please monitor your nausea symptoms and let the office know if you have any new or worsening symptoms.      >>>>>>>>>>>>>>>>>>>>>>>>>>>>>>>>>>>>>>>>>>>>>>>>>>>>>>> Coronavirus (COVID-19) Are you at risk?  Are you at risk for the Coronavirus (COVID-19)?  To be considered HIGH RISK for Coronavirus (COVID-19), you have to meet the following criteria:  . [place]ed to [place] or in the [place] or [place] and have fever, cough, and shortness of breath within the last 2 weeks of travel OR . Been in close contact with a person diagnosed with COVID-19 within the last 2 weeks and have  . fever,  cough,and shortness of breath .  . IF YOU DO NOT MEET THESE CRITERIA, YOU ARE CONSIDERED LOW RISK FOR COVID-19.  What to do if you are HIGH RISK for COVID-19?  .Marland KitchenIf you are having a medical emergency, call 911. . Seek medical care right away. Before you go to a doctor's office, urgent care or emergency department, .  call ahead and tell them about your recent travel, contact with someone diagnosed with COVID-19  .  and your symptoms.  . You should receive instructions from your physician's office regarding next steps of care.  . When you arrive at healthcare provider, tell the healthcare staff immediately you have returned from  . visiting [place] or traveled in the [place]  . [place] the last two weeks or you have been in close contact with a person diagnosed with  . COVID-19 in the last 2 weeks.   . Tell the health care staff about your symptoms: fever, cough and shortness of breath. . After you have been seen by a medical provider, you will be either: o Tested for (COVID-19) and discharged home on quarantine except to seek medical care if  o symptoms worsen, and asked to  - Stay home and avoid contact with others until you get your results (4-5 days)  - Avoid  travel on public transportation if possible (such as bus, train, or airplane) or o Sent to the Emergency Department by EMS for evaluation, COVID-19 testing  and  o possible admission depending on your condition and test results.  What to do if you are LOW RISK for COVID-19?  Reduce your risk of any infection by using the same precautions used for avoiding the common cold or flu:  Marland Kitchen Wash your hands often with soap and warm water for at least 20 seconds.  If soap and water are not readily available,  . use an alcohol-based hand sanitizer with at least 60% alcohol.  . If coughing or sneezing, cover your mouth and nose by coughing or sneezing into  the elbow areas of your shirt or coat, .  into a tissue or into your sleeve (not your hands). . Avoid shaking hands with others and consider head nods or verbal greetings only. . Avoid touching your eyes, nose, or mouth with unwashed hands.  . Avoid close contact with people who are sick. . Avoid places or events with large numbers of people in one location, like concerts or sporting events. . Carefully consider travel plans you have or are making. . If you are planning any travel outside or inside the [place], visit the CDC's Travelers' Health webpage for the latest health notices. . If you have some symptoms but not all symptoms, continue to monitor at home and seek medical attention  . if your symptoms worsen. . If you are having a medical emergency, call 911.   . >>>>>>>>>>>>>>>>>>>>>>>>>>>>>>>>> . We Do NOT Approve of  [facility], [employer]  Our Patients  To Do Home Visits & We Do NOT Approve of [employer] > > > > > > > > > > > > > > > > > > > > > > > > > > > > > > > > > > > > > > >  Preventive Care for Adults  A healthy lifestyle and preventive care can promote health and wellness. Preventive health guidelines for women include the following key practices.  A routine yearly physical is a good way to check with your health care provider about your health and preventive screening. It is a chance to share any concerns and updates on your health and to receive a thorough exam.  Visit your dentist for a routine exam and preventive care every 6 months. Brush your teeth twice a day and floss once a day. Good oral hygiene prevents tooth decay and gum disease.  The frequency of eye exams is based on your age, health, family medical history, use of contact lenses, and other factors. Follow your health care provider's recommendations for frequency of eye exams.  Eat a healthy diet. Foods like vegetables, fruits, whole grains, low-fat dairy products, and lean protein  foods contain the nutrients you need without too many calories. Decrease your intake of foods high in solid fats, added sugars, and salt. Eat the right amount of calories for you. Get information about a proper diet from your health care provider, if necessary.  Regular physical exercise is one of the most important things you can do for your health. Most adults should get at least 150 minutes of moderate-intensity exercise (any activity that increases your heart rate and causes you to sweat) each week. In addition, most adults need muscle-strengthening exercises on 2 or more days a week.  Maintain a healthy weight. The body mass index (BMI) is a screening tool  to identify possible weight problems. It provides an estimate of body fat based on height and weight. Your health care provider can find your BMI and can help you achieve or maintain a healthy weight. For adults 20 years and older:  A BMI below 18.5 is considered underweight.  A BMI of 18.5 to 24.9 is normal.  A BMI of 25 to 29.9 is considered overweight.  A BMI of 30 and above is considered obese.  Maintain normal blood lipids and cholesterol levels by exercising and minimizing your intake of saturated fat. Eat a balanced diet with plenty of fruit and vegetables. If your lipid or cholesterol levels are high, you are over 50, or you are at high risk for heart disease, you may need your cholesterol levels checked more frequently. Ongoing high lipid and cholesterol levels should be treated with medicines if diet and exercise are not working.  If you smoke, find out from your health care provider how to quit. If you do not use tobacco, do not start.  Lung cancer screening is recommended for adults aged 30-80 years who are at high risk for developing lung cancer because of a history of smoking. A yearly low-dose CT scan of the lungs is recommended for people who have at least a 30-pack-year history of smoking and are a current smoker or have  quit within the past 15 years. A pack year of smoking is smoking an average of 1 pack of cigarettes a day for 1 year (for example: 1 pack a day for 30 years or 2 packs a day for 15 years). Yearly screening should continue until the smoker has stopped smoking for at least 15 years. Yearly screening should be stopped for people who develop a health problem that would prevent them from having lung cancer treatment.  Avoid use of street drugs. Do not share needles with anyone. Ask for help if you need support or instructions about stopping the use of drugs.  High blood pressure causes heart disease and increases the risk of stroke.  Ongoing high blood pressure should be treated with medicines if weight loss and exercise do not work.  If you are 32-11 years old, ask your health care provider if you should take aspirin to prevent strokes.  Diabetes screening involves taking a blood sample to check your fasting blood sugar level. This should be done once every 3 years, after age 46, if you are within normal weight and without risk factors for diabetes. Testing should be considered at a younger age or be carried out more frequently if you are overweight and have at least 1 risk factor for diabetes.  Breast cancer screening is essential preventive care for women. You should practice "breast self-awareness." This means understanding the normal appearance and feel of your breasts and may include breast self-examination. Any changes detected, no matter how small, should be reported to a health care provider. Women in their 37s and 30s should have a clinical breast exam (CBE) by a health care provider as part of a regular health exam every 1 to 3 years. After age 61, women should have a CBE every year. Starting at age 77, women should consider having a mammogram (breast X-ray test) every year. Women who have a family history of breast cancer should talk to their health care provider about genetic screening. Women at a  high risk of breast cancer should talk to their health care providers about having an MRI and a mammogram every year.  Breast cancer  gene (BRCA)-related cancer risk assessment is recommended for women who have family members with BRCA-related cancers. BRCA-related cancers include breast, ovarian, tubal, and peritoneal cancers. Having family members with these cancers may be associated with an increased risk for harmful changes (mutations) in the breast cancer genes BRCA1 and BRCA2. Results of the assessment will determine the need for genetic counseling and BRCA1 and BRCA2 testing.  Routine pelvic exams to screen for cancer are no longer recommended for nonpregnant women who are considered low risk for cancer of the pelvic organs (ovaries, uterus, and vagina) and who do not have symptoms. Ask your health care provider if a screening pelvic exam is right for you.  If you have had past treatment for cervical cancer or a condition that could lead to cancer, you need Pap tests and screening for cancer for at least 20 years after your treatment. If Pap tests have been discontinued, your risk factors (such as having a new sexual partner) need to be reassessed to determine if screening should be resumed. Some women have medical problems that increase the chance of getting cervical cancer. In these cases, your health care provider may recommend more frequent screening and Pap tests.    Colorectal cancer can be detected and often prevented. Most routine colorectal cancer screening begins at the age of 51 years and continues through age 43 years. However, your health care provider may recommend screening at an earlier age if you have risk factors for colon cancer. On a yearly basis, your health care provider may provide home test kits to check for hidden blood in the stool. Use of a small camera at the end of a tube, to directly examine the colon (sigmoidoscopy or colonoscopy), can detect the earliest forms of  colorectal cancer. Talk to your health care provider about this at age 61, when routine screening begins.  Direct exam of the colon should be repeated every 5-10 years through age 80 years, unless early forms of pre-cancerous polyps or small growths are found.  Osteoporosis is a disease in which the bones lose minerals and strength with aging. This can result in serious bone fractures or breaks. The risk of osteoporosis can be identified using a bone density scan. Women ages 57 years and over and women at risk for fractures or osteoporosis should discuss screening with their health care providers. Ask your health care provider whether you should take a calcium supplement or vitamin D to reduce the rate of osteoporosis.  Menopause can be associated with physical symptoms and risks. Hormone replacement therapy is available to decrease symptoms and risks. You should talk to your health care provider about whether hormone replacement therapy is right for you.  Use sunscreen. Apply sunscreen liberally and repeatedly throughout the day. You should seek shade when your shadow is shorter than you. Protect yourself by wearing long sleeves, pants, a wide-brimmed hat, and sunglasses year round, whenever you are outdoors.  Once a month, do a whole body skin exam, using a mirror to look at the skin on your back. Tell your health care provider of new moles, moles that have irregular borders, moles that are larger than a pencil eraser, or moles that have changed in shape or color.  Stay current with required vaccines (immunizations).  Influenza vaccine. All adults should be immunized every year.  Tetanus, diphtheria, and acellular pertussis (Td, Tdap) vaccine. Pregnant women should receive 1 dose of Tdap vaccine during each pregnancy. The dose should be obtained regardless of the length of  time since the last dose. Immunization is preferred during the 27th-36th week of gestation. An adult who has not previously  received Tdap or who does not know her vaccine status should receive 1 dose of Tdap. This initial dose should be followed by tetanus and diphtheria toxoids (Td) booster doses every 10 years. Adults with an unknown or incomplete history of completing a 3-dose immunization series with Td-containing vaccines should begin or complete a primary immunization series including a Tdap dose. Adults should receive a Td booster every 10 years.    Zoster vaccine. One dose is recommended for adults aged 14 years or older unless certain conditions are present.    Pneumococcal 13-valent conjugate (PCV13) vaccine. When indicated, a person who is uncertain of her immunization history and has no record of immunization should receive the PCV13 vaccine. An adult aged 37 years or older who has certain medical conditions and has not been previously immunized should receive 1 dose of PCV13 vaccine. This PCV13 should be followed with a dose of pneumococcal polysaccharide (PPSV23) vaccine. The PPSV23 vaccine dose should be obtained at least 1 or more year(s) after the dose of PCV13 vaccine. An adult aged 65 years or older who has certain medical conditions and previously received 1 or more doses of PPSV23 vaccine should receive 1 dose of PCV13. The PCV13 vaccine dose should be obtained 1 or more years after the last PPSV23 vaccine dose.    Pneumococcal polysaccharide (PPSV23) vaccine. When PCV13 is also indicated, PCV13 should be obtained first. All adults aged 26 years and older should be immunized. An adult younger than age 88 years who has certain medical conditions should be immunized. Any person who resides in a nursing home or long-term care facility should be immunized. An adult smoker should be immunized. People with an immunocompromised condition and certain other conditions should receive both PCV13 and PPSV23 vaccines. People with human immunodeficiency virus (HIV) infection should be immunized as soon as possible  after diagnosis. Immunization during chemotherapy or radiation therapy should be avoided. Routine use of PPSV23 vaccine is not recommended for American Indians, [place] Natives, or people younger than 65 years unless there are medical conditions that require PPSV23 vaccine. When indicated, people who have unknown immunization and have no record of immunization should receive PPSV23 vaccine. One-time revaccination 5 years after the first dose of PPSV23 is recommended for people aged 19-64 years who have chronic kidney failure, nephrotic syndrome, asplenia, or immunocompromised conditions. People who received 1-2 doses of PPSV23 before age 55 years should receive another dose of PPSV23 vaccine at age 50 years or later if at least 5 years have passed since the previous dose. Doses of PPSV23 are not needed for people immunized with PPSV23 at or after age 40 years.   Preventive Services / Frequency  Ages 61 years and over  Blood pressure check.  Lipid and cholesterol check.  Lung cancer screening. / Every year if you are aged 21-80 years and have a 30-pack-year history of smoking and currently smoke or have quit within the past 15 years. Yearly screening is stopped once you have quit smoking for at least 15 years or develop a health problem that would prevent you from having lung cancer treatment.  Clinical breast exam.** / Every year after age 65 years.   BRCA-related cancer risk assessment.** / For women who have family members with a BRCA-related cancer (breast, ovarian, tubal, or peritoneal cancers).  Mammogram.** / Every year beginning at age 70 years and continuing  for as long as you are in good health. Consult with your health care provider.  Pap test.** / Every 3 years starting at age 59 years through age 55 or 67 years with 3 consecutive normal Pap tests. Testing can be stopped between 65 and 70 years with 3 consecutive normal Pap tests and no abnormal Pap or HPV tests in the past 10  years.  Fecal occult blood test (FOBT) of stool. / Every year beginning at age 72 years and continuing until age 39 years. You may not need to do this test if you get a colonoscopy every 10 years.  Flexible sigmoidoscopy or colonoscopy.** / Every 5 years for a flexible sigmoidoscopy or every 10 years for a colonoscopy beginning at age 53 years and continuing until age 65 years.  Hepatitis C blood test.** / For all people born from [birth year] through 1963 and any individual with known risks for hepatitis C.  Osteoporosis screening.** / A one-time screening for women ages 44 years and over and women at risk for fractures or osteoporosis.  Skin self-exam. / Monthly.  Influenza vaccine. / Every year.  Tetanus, diphtheria, and acellular pertussis (Tdap/Td) vaccine.** / 1 dose of Td every 10 years.  Zoster vaccine.** / 1 dose for adults aged 79 years or older.  Pneumococcal 13-valent conjugate (PCV13) vaccine.** / Consult your health care provider.  Pneumococcal polysaccharide (PPSV23) vaccine.** / 1 dose for all adults aged 44 years and older. Screening for abdominal aortic aneurysm (AAA)  by ultrasound is recommended for people who have history of high blood pressure or who are current or former smokers. ++++++++++++++++++++ Recommend Adult Low Dose Aspirin or  coated  Aspirin 81 mg daily  To reduce risk of Colon Cancer 20 %,  Skin Cancer 26 % ,  Melanoma 46%  and  Pancreatic cancer 60% ++++++++++++++++++++ Vitamin D goal  is between 70-100.  Please make sure that you are taking your Vitamin D as directed.  It is very important as a natural anti-inflammatory  helping hair, skin, and nails, as well as reducing stroke and heart attack risk.  It helps your bones and helps with mood. It also decreases numerous cancer risks so please take it as directed.  Low Vit D is associated with a 200-300% higher risk for CANCER  and 200-300% higher risk for HEART   ATTACK  &  STROKE.    .....................................Marland Kitchen It is also associated with higher death rate at younger ages,  autoimmune diseases like Rheumatoid arthritis, Lupus, Multiple Sclerosis.    Also many other serious conditions, like depression, Alzheimer's Dementia, infertility, muscle aches, fatigue, fibromyalgia - just to name a few. ++++++++++++++++++ Recommend the book "The END of DIETING" by Dr Excell Seltzer  & the book "The END of DIABETES " by Dr Excell Seltzer At [email or web address] - get book & Audio CD's    Being diabetic has a  300% increased risk for heart attack, stroke, cancer, and alzheimer- type vascular dementia. It is very important that you work harder with diet by avoiding all foods that are white. Avoid white rice (brown & wild rice is OK), white potatoes (sweetpotatoes in moderation is OK), White bread or wheat bread or anything made out of white flour like bagels, donuts, rolls, buns, biscuits, cakes, pastries, cookies, pizza crust, and pasta (made from white flour & egg whites) - vegetarian pasta or spinach or wheat pasta is OK. Multigrain breads like Arnold's or Pepperidge [employer], or multigrain sandwich thins or flatbreads.  Diet, exercise and  weight loss can reverse and cure diabetes in the early stages.  Diet, exercise and weight loss is very important in the control and prevention of complications of diabetes which affects every system in your body, ie. Brain - dementia/stroke, eyes - glaucoma/blindness, heart - heart attack/heart failure, kidneys - dialysis, stomach - gastric paralysis, intestines - malabsorption, nerves - severe painful neuritis, circulation - gangrene & loss of a leg(s), and finally cancer and Alzheimers.    I recommend avoid fried & greasy foods,  sweets/candy, white rice (brown or wild rice or Quinoa is OK), white potatoes (sweet potatoes are OK) - anything made from white flour - bagels, doughnuts, rolls, buns, biscuits,white and wheat breads, pizza crust and traditional pasta  made of white flour & egg white(vegetarian pasta or spinach or wheat pasta is OK).  Multi-grain bread is OK - like multi-grain flat bread or sandwich thins. Avoid alcohol in excess. Exercise is also important.    Eat all the vegetables you want - avoid meat, especially red meat and dairy - especially cheese.  Cheese is the most concentrated form of trans-fats which is the worst thing to clog up our arteries. Veggie cheese is OK which can be found in the fresh produce section at [employer] or [employer] or [employer]  +++++++++++++++++++ DASH Eating Plan  DASH stands for "Dietary Approaches to Stop Hypertension."   The DASH eating plan is a healthy eating plan that has been shown to reduce high blood pressure (hypertension). Additional health benefits may include reducing the risk of type 2 diabetes mellitus, heart disease, and stroke. The DASH eating plan may also help with weight loss. WHAT DO I NEED TO KNOW ABOUT THE DASH EATING PLAN? For the DASH eating plan, you will follow these general guidelines:  Choose foods with a percent daily value for sodium of less than 5% (as listed on the food label).  Use salt-free seasonings or herbs instead of table salt or sea salt.  Check with your health care provider or pharmacist before using salt substitutes.  Eat lower-sodium products, often labeled as "lower sodium" or "no salt added."  Eat fresh foods.  Eat more vegetables, fruits, and low-fat dairy products.  Choose whole grains. Look for the word "whole" as the first word in the ingredient list.  Choose fish   Limit sweets, desserts, sugars, and sugary drinks.  Choose heart-healthy fats.  Eat veggie cheese   Eat more home-cooked food and less restaurant, buffet, and fast food.  Limit fried foods.  Cook foods using methods other than frying.  Limit canned vegetables. If you do use them, rinse them well to decrease the sodium.  When eating at a restaurant, ask that your food  be prepared with less salt, or no salt if possible.                      WHAT FOODS CAN I EAT? Read Dr Fara Olden Fuhrman's books on The End of Dieting & The End of Diabetes  Grains Whole grain or whole wheat bread. Brown rice. Whole grain or whole wheat pasta. Quinoa, bulgur, and whole grain cereals. Low-sodium cereals. Corn or whole wheat flour tortillas. Whole grain cornbread. Whole grain crackers. Low-sodium crackers.  Vegetables Fresh or frozen vegetables (raw, steamed, roasted, or grilled). Low-sodium or reduced-sodium tomato and vegetable juices. Low-sodium or reduced-sodium tomato sauce and paste. Low-sodium or reduced-sodium canned vegetables.   Fruits All fresh, canned (in natural juice), or frozen fruits.  Protein Products  All fish and seafood.  Dried beans, peas, or lentils. Unsalted nuts and seeds. Unsalted canned beans.  Dairy Low-fat dairy products, such as skim or 1% milk, 2% or reduced-fat cheeses, low-fat ricotta or cottage cheese, or plain low-fat yogurt. Low-sodium or reduced-sodium cheeses.  Fats and Oils Tub margarines without trans fats. Light or reduced-fat mayonnaise and salad dressings (reduced sodium). Avocado. Safflower, olive, or canola oils. Natural peanut or almond butter.  Other Unsalted popcorn and pretzels. The items listed above may not be a complete list of recommended foods or beverages. Contact your dietitian for more options.  +++++++++++++++  WHAT FOODS ARE NOT RECOMMENDED? Grains/ White flour or wheat flour White bread. White pasta. White rice. Refined cornbread. Bagels and croissants. Crackers that contain trans fat.  Vegetables  Creamed or fried vegetables. Vegetables in a . Regular canned vegetables. Regular canned tomato sauce and paste. Regular tomato and vegetable juices.  Fruits Dried fruits. Canned fruit in light or heavy syrup. Fruit juice.  Meat and Other Protein Products Meat in general - RED meat & White meat.  Fatty cuts of  meat. Ribs, chicken wings, all processed meats as bacon, sausage, bologna, salami, fatback, hot dogs, bratwurst and packaged luncheon meats.  Dairy Whole or 2% milk, cream, half-and-half, and cream cheese. Whole-fat or sweetened yogurt. Full-fat cheeses or blue cheese. Non-dairy creamers and whipped toppings. Processed cheese, cheese spreads, or cheese curds.  Condiments Onion and garlic salt, seasoned salt, table salt, and sea salt. Canned and packaged gravies. Worcestershire sauce. Tartar sauce. Barbecue sauce. Teriyaki sauce. Soy sauce, including reduced sodium. Steak sauce. Fish sauce. Oyster sauce. Cocktail sauce. Horseradish. Ketchup and mustard. Meat flavorings and tenderizers. Bouillon cubes. Hot sauce. Tabasco sauce. Marinades. Taco seasonings. Relishes.  Fats and Oils Butter, stick margarine, lard, shortening and bacon fat. Coconut, palm kernel, or palm oils. Regular salad dressings.  Pickles and olives. Salted popcorn and pretzels.  The items listed above may not be a complete list of foods and beverages to avoid.

## 2019-01-06 ENCOUNTER — Other Ambulatory Visit: Payer: Self-pay | Admitting: Adult Health

## 2019-01-06 DIAGNOSIS — N183 Chronic kidney disease, stage 3 (moderate): Principal | ICD-10-CM

## 2019-01-06 DIAGNOSIS — E1122 Type 2 diabetes mellitus with diabetic chronic kidney disease: Secondary | ICD-10-CM

## 2019-01-07 ENCOUNTER — Other Ambulatory Visit: Payer: Self-pay | Admitting: Adult Health

## 2019-01-07 DIAGNOSIS — E1122 Type 2 diabetes mellitus with diabetic chronic kidney disease: Secondary | ICD-10-CM

## 2019-01-07 DIAGNOSIS — N183 Chronic kidney disease, stage 3 (moderate): Principal | ICD-10-CM

## 2019-02-08 ENCOUNTER — Telehealth: Payer: Self-pay

## 2019-02-08 DIAGNOSIS — M48061 Spinal stenosis, lumbar region without neurogenic claudication: Secondary | ICD-10-CM | POA: Diagnosis not present

## 2019-02-08 DIAGNOSIS — Z79891 Long term (current) use of opiate analgesic: Secondary | ICD-10-CM | POA: Diagnosis not present

## 2019-02-08 NOTE — Telephone Encounter (Signed)
Requesting clarification on insulin dosage, please advise.

## 2019-02-09 ENCOUNTER — Encounter: Payer: Self-pay | Admitting: Adult Health Nurse Practitioner

## 2019-02-09 ENCOUNTER — Telehealth: Payer: Self-pay | Admitting: Adult Health Nurse Practitioner

## 2019-02-09 NOTE — Telephone Encounter (Signed)
See encounter from [place] in regard to this.

## 2019-02-09 NOTE — Telephone Encounter (Signed)
02/09/19, 0830 spoke with Jonny Ruiz, daughter in regards to insulin dosing.  Patient should be injecting 45units into subcutaneous skin 32min before breakfast and 35units before dinner.  She reports that she is helping her with her medicaitons as well as her brother.  She has been checking her glucose more regularly.  Fasting 172 this am and yesterday evening 300, post prandial.  Encouraged keeping a glucose log to help promote routine. All questions answered.   They are to call with any further concerns or questions.    Garnet Sierras, NP [facility] 02/09/2019  [phone]

## 2019-02-15 DIAGNOSIS — H43812 Vitreous degeneration, left eye: Secondary | ICD-10-CM | POA: Diagnosis not present

## 2019-02-15 DIAGNOSIS — H353221 Exudative age-related macular degeneration, left eye, with active choroidal neovascularization: Secondary | ICD-10-CM | POA: Diagnosis not present

## 2019-02-24 ENCOUNTER — Other Ambulatory Visit: Payer: Self-pay | Admitting: Adult Health

## 2019-02-24 DIAGNOSIS — E782 Mixed hyperlipidemia: Secondary | ICD-10-CM

## 2019-02-25 DIAGNOSIS — M5136 Other intervertebral disc degeneration, lumbar region: Secondary | ICD-10-CM | POA: Diagnosis not present

## 2019-03-01 ENCOUNTER — Ambulatory Visit: Payer: Self-pay | Admitting: Adult Health

## 2019-03-09 DIAGNOSIS — L84 Corns and callosities: Secondary | ICD-10-CM | POA: Diagnosis not present

## 2019-03-09 DIAGNOSIS — M79676 Pain in unspecified toe(s): Secondary | ICD-10-CM | POA: Diagnosis not present

## 2019-03-09 DIAGNOSIS — E1151 Type 2 diabetes mellitus with diabetic peripheral angiopathy without gangrene: Secondary | ICD-10-CM | POA: Diagnosis not present

## 2019-03-09 DIAGNOSIS — B351 Tinea unguium: Secondary | ICD-10-CM | POA: Diagnosis not present

## 2019-03-22 ENCOUNTER — Other Ambulatory Visit: Payer: Self-pay

## 2019-03-22 NOTE — Patient Outreach (Signed)
[facility]) Care Management  03/22/2019  Catherine Wood 02-14-1931 [id number]   Medication Adherence call to Catherine Wood Compliant Voice message left with a call back number. Catherine Wood is showing past due on Metformin 1000 mg and Losartan 100 mg under Catherine Wood.  [facility] Direct Dial [phone]  Fax [phone] [email or web address]

## 2019-03-25 DIAGNOSIS — F0150 Vascular dementia without behavioral disturbance: Secondary | ICD-10-CM | POA: Insufficient documentation

## 2019-03-25 NOTE — Progress Notes (Signed)
FOLLOW UP  Assessment and Plan:   Hypertension Well controlled with current medications  Monitor blood pressure at home; patient to call if consistently greater than 130/80 Continue DASH diet.   Reminder to go to the ER if any CP, SOB, nausea, dizziness, severe HA, changes vision/speech, left arm numbness and tingling and jaw pain.  Cholesterol Currently at goal; continue pravastatin  Continue low cholesterol diet and exercise.  Check lipid panel.   Diabetes with diabetic neuropathy, unspecified Continue medication: metformin, novolin 70/30 - 45 units in AM and 35 units PM Discussed goal fasting <150 Continue diet and exercise.  Perform daily foot/skin check, notify office of any concerning changes.  Check A1C  Obesity with co morbidities Long discussion about weight loss, diet, and exercise Recommended diet heavy in fruits and veggies and low in animal meats, cheeses, and dairy products, appropriate calorie intake Discussed ideal weight for height Will follow up in 3 months  Vitamin D Def Below goal at last visit; questionable medication compliance continue supplementation to maintain goal of 60-100 Chec Vit D level to CPE  Vascular dementia ([place]) Mild dementia; son is helping with meds for compliance; reviewed at length today with goal to reduce pill burden if possible  Control blood pressure, cholesterol, glucose, increase exercise.    Continue diet and meds as discussed. Further disposition pending results of labs. Discussed med's effects and SE's.   Over 30 minutes of exam, counseling, chart review, and critical decision making was performed.   Future Appointments  Date Time Provider [place]  06/28/2019 10:00 AM [name], MD [facility] None  01/18/2020  2:00 PM Garnet Sierras, NP [facility] None    ----------------------------------------------------------------------------------------------------------------------  HPI 85 y.o. female   presents for 3 month follow up on hypertension, cholesterol, diabetes, weight and vitamin D deficiency. She is accompanied by her son, who has been taking care of medications, lives next to her and assists with medications 3 times a day.   MRI from 10/2014 showed atrophy and small vessel disease  She has neuropathy and takes cymbalta 30 mg daily, gabapenting 300 mg 1 cap AM, 2 caps in afternoon, 2 caps PM  BMI is Body mass index is 33.61 kg/m., she has not been working on diet and exercise. Wt Readings from Last 3 Encounters:  03/29/19 202 lb (91.6 kg)  01/05/19 200 lb (90.7 kg)  10/21/18 209 lb 9.6 oz (95.1 kg)    Her blood pressure has been controlled at home, today their BP is BP: (!) 132/58  She does not workout. She denies chest pain, shortness of breath, dizziness. She has venous insufficiency and is taking lasix 40 mg BID   She is on cholesterol medication Pravastatin 40 mg, fenofibrate 160 mg daily and denies myalgias. Her cholesterol is at goal. The cholesterol last visit was:   Lab Results  Component Value Date   CHOL 166 05/29/2018   HDL 50 (L) 05/29/2018   LDLCALC 72 05/29/2018   TRIG 365 (H) 05/29/2018   CHOLHDL 3.3 05/29/2018    She has not been working on diet and exercise for T2 diabetes (on metformin 2000 mg daily, novolin 70/30 45 units AM, and 35 units PM, and denies foot ulcerations, hypoglycemia , increased appetite, nausea, paresthesia of the feet, polydipsia, polyuria, visual disturbances, vomiting and weight loss. Checking glucose  Last A1C in the office was:  Lab Results  Component Value Date   HGBA1C 9.4 (H) 05/29/2018   Lab Results  Component Value Date   [facility]  50 (L) 05/29/2018   She is on thyroid medication. Her medication was not changed last visit.   Lab Results  Component Value Date   TSH 4.12 05/29/2018   Patient is on Vitamin D supplement.   Lab Results  Component Value Date   VD25OH 38 05/29/2018       Current Medications:  Current  Outpatient Medications on File Prior to Visit  Medication Sig  . aspirin 81 MG tablet Take 81 mg by mouth daily.  . Cholecalciferol (VITAMIN D3) 5000 UNITS CAPS Take 5,000 Units by mouth daily.   . DULoxetine (CYMBALTA) 30 MG capsule Take 30 mg by mouth daily.  . fenofibrate 160 MG tablet TAKE 1 TABLET DAILY  . furosemide (LASIX) 40 MG tablet TAKE 1 OR 2 TABLETS DAILY AS NEEDED FOR SWELLING/EDEMA  . gabapentin (NEURONTIN) 300 MG capsule 1 CAPSULE EACH MORNING, 2 EACH AFTERNOON, AND 2 AT BEDTIME  . Insulin Pen Needle (PEN NEEDLES 31GX5/16") 31G X 8 MM MISC USE 4 TIMES A DAY  . levothyroxine (SYNTHROID) 100 MCG tablet TAKE (1) TABLET DAILY BE- FORE BREAKFAST.  Marland Kitchen losartan (COZAAR) 100 MG tablet TAKE 1 TABLET ONCE A DAY FOR HIGH BLOOD PRESSURE  . Magnesium 400 MG CAPS Take by mouth 4 (four) times daily.  Marland Kitchen MEGARED OMEGA-3 KRILL OIL 500 MG CAPS Take 500 mg by mouth 2 (two) times daily.  . metFORMIN (GLUCOPHAGE) 1000 MG tablet TAKE (1) TABLET TWICE A DAY WITH MEALS (BREAKFAST AND SUPPER)  . Multiple Vitamins-Minerals (ICAPS PO) Take by mouth daily.  Marland Kitchen NOVOLIN 70/30 RELION (70-30) 100 UNIT/ML injection USE AS DIRECTED (Patient taking differently: Inject 45 units into subcutaneous skin 85min before breakfast and 35units 80min before dinner.)  . OVER THE COUNTER MEDICATION as needed. Macular Degeneration refresh eye drops  . pravastatin (PRAVACHOL) 40 MG tablet TAKE 1 TABLET DAILY FOR CHOLESTEROL  . ACCU-CHEK SOFTCLIX LANCETS lancets CHECK BLOOD SUGER UP TO 3 TIMES A DAY   No current facility-administered medications on file prior to visit.      Allergies:  Allergies  Allergen Reactions  . Detrol [Tolterodine]      Medical History:  Past Medical History:  Diagnosis Date  . Arthritis   . GERD (gastroesophageal reflux disease)   . HOH (hard of hearing)   . Hyperlipidemia   . Hypertension   . Hypothyroid   . IDDM (insulin dependent diabetes mellitus) ([place])   . Leg swelling   . Macular  degeneration   . Shortness of breath dyspnea    with exertion  . Sleep apnea    does not wear CPAP  . Varicose veins   . Wears glasses    Family history- Reviewed and unchanged Social history- Reviewed and unchanged   Review of Systems:  Review of Systems  Constitutional: Negative for malaise/fatigue and weight loss.  HENT: Negative for hearing loss and tinnitus.   Eyes: Negative for blurred vision and double vision.  Respiratory: Negative for cough, shortness of breath and wheezing.   Cardiovascular: Negative for chest pain, palpitations, orthopnea, claudication and leg swelling.  Gastrointestinal: Negative for abdominal pain, blood in stool, constipation, diarrhea, heartburn, melena, nausea and vomiting.  Genitourinary: Negative.   Musculoskeletal: Negative for joint pain and myalgias.  Skin: Negative for rash.  Neurological: Negative for dizziness, tingling, sensory change, weakness and headaches.  Endo/Heme/Allergies: Negative for polydipsia.  Psychiatric/Behavioral: Negative.   All other systems reviewed and are negative.   Physical Exam: BP (!) 132/58   Pulse 84  Temp (!) 97.5 F (36.4 C)   Ht 5\' 5"  (1.651 m)   Wt 202 lb (91.6 kg)   SpO2 95%   BMI 33.61 kg/m  Wt Readings from Last 3 Encounters:  03/29/19 202 lb (91.6 kg)  01/05/19 200 lb (90.7 kg)  10/21/18 209 lb 9.6 oz (95.1 kg)   General Appearance: Well nourished, in no apparent distress. Eyes: PERRLA, EOMs, conjunctiva no swelling or erythema Sinuses: No Frontal/maxillary tenderness ENT/Mouth: Ext aud canals clear, TMs without erythema, bulging. No erythema, swelling, or exudate on post pharynx.  Tonsils not swollen or erythematous. Hearing normal.  Neck: Supple, thyroid normal.  Respiratory: Respiratory effort normal, BS equal bilaterally without rales, rhonchi, wheezing or stridor.  Cardio: RRR with no MRGs. Brisk peripheral pulses without edema.  Abdomen: Soft, + BS.  Non tender, no guarding, rebound,  hernias, masses. Lymphatics: Non tender without lymphadenopathy.  Musculoskeletal: Full ROM, 5/5 strength, slow gait with cane Skin: Warm, dry without rashes, lesions, ecchymosis.  Neuro: Cranial nerves intact. No cerebellar symptoms.  Psych: Awake and oriented X 3, normal affect, Insight and Judgment fair  MMSE: 28/30  Izora Ribas, NP 4:16 PM [facility]

## 2019-03-29 ENCOUNTER — Ambulatory Visit (INDEPENDENT_AMBULATORY_CARE_PROVIDER_SITE_OTHER): Payer: Medicare Other | Admitting: Adult Health

## 2019-03-29 ENCOUNTER — Encounter: Payer: Self-pay | Admitting: Adult Health

## 2019-03-29 ENCOUNTER — Other Ambulatory Visit: Payer: Self-pay | Admitting: Adult Health

## 2019-03-29 ENCOUNTER — Other Ambulatory Visit: Payer: Self-pay

## 2019-03-29 VITALS — BP 132/58 | HR 84 | Temp 97.5°F | Ht 65.0 in | Wt 202.0 lb

## 2019-03-29 DIAGNOSIS — H43812 Vitreous degeneration, left eye: Secondary | ICD-10-CM | POA: Diagnosis not present

## 2019-03-29 DIAGNOSIS — E1122 Type 2 diabetes mellitus with diabetic chronic kidney disease: Secondary | ICD-10-CM

## 2019-03-29 DIAGNOSIS — E785 Hyperlipidemia, unspecified: Secondary | ICD-10-CM

## 2019-03-29 DIAGNOSIS — I10 Essential (primary) hypertension: Secondary | ICD-10-CM

## 2019-03-29 DIAGNOSIS — F0150 Vascular dementia without behavioral disturbance: Secondary | ICD-10-CM

## 2019-03-29 DIAGNOSIS — N183 Chronic kidney disease, stage 3 (moderate): Secondary | ICD-10-CM

## 2019-03-29 DIAGNOSIS — H353221 Exudative age-related macular degeneration, left eye, with active choroidal neovascularization: Secondary | ICD-10-CM | POA: Diagnosis not present

## 2019-03-29 DIAGNOSIS — E1165 Type 2 diabetes mellitus with hyperglycemia: Secondary | ICD-10-CM

## 2019-03-29 DIAGNOSIS — E039 Hypothyroidism, unspecified: Secondary | ICD-10-CM

## 2019-03-29 DIAGNOSIS — Z794 Long term (current) use of insulin: Secondary | ICD-10-CM

## 2019-03-29 DIAGNOSIS — E669 Obesity, unspecified: Secondary | ICD-10-CM

## 2019-03-29 DIAGNOSIS — E559 Vitamin D deficiency, unspecified: Secondary | ICD-10-CM

## 2019-03-29 DIAGNOSIS — H353212 Exudative age-related macular degeneration, right eye, with inactive choroidal neovascularization: Secondary | ICD-10-CM | POA: Diagnosis not present

## 2019-03-29 DIAGNOSIS — E1169 Type 2 diabetes mellitus with other specified complication: Secondary | ICD-10-CM | POA: Diagnosis not present

## 2019-03-29 DIAGNOSIS — I872 Venous insufficiency (chronic) (peripheral): Secondary | ICD-10-CM

## 2019-03-29 DIAGNOSIS — H353134 Nonexudative age-related macular degeneration, bilateral, advanced atrophic with subfoveal involvement: Secondary | ICD-10-CM | POA: Diagnosis not present

## 2019-03-29 MED ORDER — NOVOLIN 70/30 RELION (70-30) 100 UNIT/ML ~~LOC~~ SUSP: SUBCUTANEOUS | 1 refills | Status: DC

## 2019-03-29 NOTE — Patient Instructions (Addendum)
Goals    . Blood Pressure < 130/80    . fasting glucose     Aim for <150, call if having persistently higher than this, or with any low sugars     \   Vascular Dementia Dementia is a condition in which a person has problems with thinking, memory, and behavior that are severe enough to interfere with daily life. Vascular dementia is a type of dementia. It results from brain damage that is caused by the brain not getting enough blood. This condition may also be called vascular cognitive impairment. What are the causes? Vascular dementia is caused by conditions that lessen blood flow to the brain. Common causes of this condition include:  Multiple small strokes. These may happen without symptoms (silent stroke).  Major stroke.  Damage to small blood vessels in the brain (cerebral small vessel disease). What increases the risk? The following factors may make you more likely to develop this condition:  Having had a stroke.  Having high blood pressure (hypertension) or high cholesterol.  Having a disease that affects the heart or blood vessels.  Smoking.  Having diabetes.  Having metabolic syndrome.  Being obese.  Not being active.  Having depression.  Being over age 57. What are the signs or symptoms? Symptoms can vary from one person to another. Symptoms may be mild or severe depending on the amount of damage and which parts of the brain have been affected. Symptoms may begin suddenly or may develop slowly. Mental symptoms of vascular dementia may include:  Confusion.  Memory problems.  Poor attention and concentration.  Trouble understanding speech.  Depression.  Personality changes.  Trouble recognizing familiar people.  Agitation or aggression.  Paranoia.  Delusions or hallucinations. Physical symptoms of vascular dementia may include:  Weakness.  Poor balance.  Loss of bladder or bowel control (incontinence).  Unsteady walking (gait).   Speaking problems. Behavioral symptoms of vascular dementia may include:  Getting lost in familiar places.  Problems with planning and judgment.  Trouble following instructions.  Social problems.  Emotional outbursts.  Trouble with daily activities and self-care.  Problems handling money. Symptoms may remain stable, or they may get worse over time. Symptoms of vascular dementia may be similar to those of Alzheimer's disease. The two conditions can occur together (mixed dementia). How is this diagnosed? Your health care provider will consider your medical history and symptoms or changes that are reported by friends and family. Your health care provider will do a physical exam and may order lab tests or other tests that check brain and nervous system function. Tests that may be done include:  Blood tests.  Brain imaging tests.  Tests of movement, speech, and other daily activities (neurological exam).  Tests of memory, thinking, and problem-solving (neuropsychological or neurocognitive testing). There is not a specific test to diagnose vascular dementia. Diagnosis may involve several specialists. These may include:  A health care provider who specializes in the brain and nervous system (neurologist).  A health provider who specializes in understanding how problems in the brain can alter behavior and cognitive function (neuropsychologist). How is this treated? There is no cure for vascular dementia. Brain damage that has already occurred cannot be reversed. Treatment depends on:  How severe the condition is.  Which parts of your brain have been affected.  Your overall health. Treatment measures aim to:  Treat the underlying cause of vascular dementia and manage risk factors. This may include: ? Controlling blood pressure. ? Lowering cholesterol. ?  Treating diabetes. ? Quitting smoking. ? Losing weight or maintaining a healthy weight. ? Eating a healthy, balanced diet. ?  Getting regular exercise.  Manage symptoms.  Prevent further brain damage.  Improve the person's health and quality of life. Treatment for dementia may involve a team of health care providers, including:  A neurologist.  A provider who specializes in disorders of the mind (psychiatrist).  A provider who specializes in helping people learn daily living skills (occupational therapist).  A provider who focuses on speech and language changes (Electrical engineer).  A heart specialist (cardiologist).  A provider who helps people learn how to manage physical changes, such as movement and walking (exercise physiologist or physical therapist). Follow these instructions at home: Lifestyle  People with vascular dementia may need regular help at home or daily care from a family member or home health care worker. Home care for a person with vascular dementia depends on what caused the condition and how severe the symptoms are. General guidelines for caregivers include:  Help the person with dementia remember people, appointments, and daily activities.  Help the person with dementia manage his or her medicines.  Help family and friends learn about ways to communicate with the person with dementia.  Create a safe living space to reduce the risk of injury or falls.  Find a support group to help caregivers and family cope with the effects of dementia.  General instructions  Help the person take over-the-counter and prescription medicines only as told by the health care provider.  Follow the health care provider's instructions for treating the condition that caused the dementia.  Make sure the person keeps all follow-up visits as told by the health care provider. This is important. Contact a health care provider if:  A fever develops.  New behavioral problems develop.  Problems with swallowing develop.  Confusion gets worse.  Sleepiness gets worse. Get help right away if:  Loss  of consciousness occurs.  There is a sudden loss of speech, balance, or thinking ability.  New numbness or paralysis occurs.  Sudden, severe headache occurs.  Vision is lost or suddenly gets worse in one or both eyes. Summary  Vascular dementia is a type of dementia. It results from brain damage that is caused by the brain not getting enough blood.  Vascular dementia is caused by conditions that lessen blood flow to the brain. Common causes of this condition include stroke and damage to small blood vessels in the brain.  Treatment focuses on treating the underlying cause of vascular dementia and managing any risk factors.  People with vascular dementia may need regular help at home or daily care from a family member or home health care worker.  Contact a health care provider if you or your caregiver notice any new symptoms. This information is not intended to replace advice given to you by your health care provider. Make sure you discuss any questions you have with your health care provider. Document Released: 08/16/2002 Document Revised: 05/27/2018 Document Reviewed: 05/28/2018 Elsevier Patient Education  [place].      High Triglycerides Eating Plan Triglycerides are a type of fat in the blood. High levels of triglycerides can increase your risk of heart disease and stroke. If your triglyceride levels are high, choosing the right foods can help lower your triglycerides and keep your heart healthy. Work with your health care provider or a diet and nutrition specialist (dietitian) to develop an eating plan that is right for you. What are  tips for following this plan? General guidelines   Lose weight, if you are overweight. For most people, losing 5-10 lbs (2-5 kg) helps lower triglyceride levels. A weight-loss plan may include. ? 30 minutes of exercise at least 5 days a week. ? Reducing the amount of calories, sugar, and fat you eat.  Eat a wide variety of fresh  fruits, vegetables, and whole grains. These foods are high in fiber.  Eat foods that contain healthy fats, such as fatty fish, nuts, seeds, and olive oil.  Avoid foods that are high in added sugar, added salt (sodium), saturated fat, and trans fat.  Avoid low-fiber, refined carbohydrates such as white bread, crackers, noodles, and white rice.  Avoid foods with partially hydrogenated oils (trans fats), such as fried foods or stick margarine.  Limit alcohol intake to no more than 1 drink a day for nonpregnant women and 2 drinks a day for men. One drink equals 12 oz of beer, 5 oz of wine, or 1 oz of hard liquor. Your health care provider may recommend that you drink less depending on your overall health. Reading food labels  Check food labels for the amount of saturated fat. Choose foods with no or very little saturated fat.  Check food labels for the amount of trans fat. Choose foods with no trans fat.  Check food labels for the amount of cholesterol. Choose foods low in cholesterol. Ask your dietitian how much cholesterol you should have each day.  Check food labels for the amount of sodium. Choose foods with less than 140 milligrams (mg) per serving. Shopping  Buy dairy products labeled as nonfat (skim) or low-fat (1%).  Avoid buying processed or prepackaged foods. These are often high in added sugar, sodium, and fat. Cooking  Choose healthy fats when cooking, such as olive oil or canola oil.  Cook foods using lower fat methods, such as baking, broiling, boiling, or grilling.  Make your own sauces, dressings, and marinades when possible, instead of buying them. Store-bought sauces, dressings, and marinades are often high in sodium and sugar. Meal planning  Eat more home-cooked food and less restaurant, buffet, and fast food.  Eat fatty fish at least 2 times each week. Examples of fatty fish include salmon, trout, mackerel, tuna, and herring.  If you eat whole eggs, do not eat  more than 3 egg yolks per week. What foods are recommended? The items listed may not be a complete list. Talk with your dietitian about what dietary choices are best for you. Grains Whole wheat or whole grain breads, crackers, cereals, and pasta. Unsweetened oatmeal. Bulgur. Barley. Quinoa. Brown rice. Whole wheat flour tortillas. Vegetables Fresh or frozen vegetables. Low-sodium canned vegetables. Fruits All fresh, canned (in natural juice), or frozen fruits. Meats and other protein foods Skinless chicken or [place]. Ground chicken or [place]. Lean cuts of pork, trimmed of fat. Fish and seafood, especially salmon, trout, and herring. Egg whites. Dried beans, peas, or lentils. Unsalted nuts or seeds. Unsalted canned beans. Natural peanut or almond butter. Dairy Low-fat dairy products. Skim or low-fat (1%) milk. Reduced fat (2%) and low-sodium cheese. Low-fat ricotta cheese. Low-fat cottage cheese. Plain, low-fat yogurt. Fats and oils Tub margarine without trans fats. Light or reduced-fat mayonnaise. Light or reduced-fat salad dressings. Avocado. Safflower, olive, sunflower, soybean, and canola oils. What foods are not recommended? The items listed may not be a complete list. Talk with your dietitian about what dietary choices are best for you. Grains White bread. White (regular) pasta.  White rice. Cornbread. Bagels. Pastries. Crackers that contain trans fat. Vegetables Creamed or fried vegetables. Vegetables in a cheese sauce. Fruits Sweetened dried fruit. Canned fruit in syrup. Fruit juice. Meats and other protein foods Fatty cuts of meat. Ribs. Chicken wings. Berniece Salines. Sausage. Bologna. Salami. Chitterlings. Fatback. Hot dogs. Bratwurst. Packaged lunch meats. Dairy Whole or reduced-fat (2%) milk. Half-and-half. Cream cheese. Full-fat or sweetened yogurt. Full-fat cheese. Nondairy creamers. Whipped toppings. Processed cheese or cheese spreads. Cheese curds. Beverages Alcohol. Sweetened  drinks, such as soda, lemonade, fruit drinks, or punches. Fats and oils Butter. Stick margarine. Lard. Shortening. Ghee. Bacon fat. Tropical oils, such as coconut, palm kernel, or palm oils. Sweets and desserts Corn syrup. Sugars. Honey. Molasses. Candy. Jam and jelly. Syrup. Sweetened cereals. Cookies. Pies. Cakes. Donuts. Muffins. Ice cream. Condiments Store-bought sauces, dressings, and marinades that are high in sugar, such as ketchup and barbecue sauce. Summary  High levels of triglycerides can increase the risk of heart disease and stroke. Choosing the right foods can help lower your triglycerides.  Eat plenty of fresh fruits, vegetables, and whole grains. Choose low-fat dairy and lean meats. Eat fatty fish at least twice a week.  Avoid processed and prepackaged foods with added sugar, sodium, saturated fat, and trans fat.  If you need suggestions or have questions about what types of food are good for you, talk with your health care provider or a dietitian. This information is not intended to replace advice given to you by your health care provider. Make sure you discuss any questions you have with your health care provider. Document Released: 06/13/2004 Document Revised: 08/08/2017 Document Reviewed: 10/29/2016 Elsevier Patient Education  2020 [employer].

## 2019-03-30 ENCOUNTER — Other Ambulatory Visit: Payer: Self-pay | Admitting: Adult Health

## 2019-03-30 DIAGNOSIS — N183 Chronic kidney disease, stage 3 (moderate): Secondary | ICD-10-CM

## 2019-03-30 DIAGNOSIS — E1122 Type 2 diabetes mellitus with diabetic chronic kidney disease: Secondary | ICD-10-CM

## 2019-03-30 LAB — CBC WITH DIFFERENTIAL/PLATELET
Absolute Monocytes: 1020 cells/uL — ABNORMAL HIGH (ref 200–950)
Basophils Absolute: 60 cells/uL (ref 0–200)
Basophils Relative: 0.6 %
Eosinophils Absolute: 660 cells/uL — ABNORMAL HIGH (ref 15–500)
Eosinophils Relative: 6.6 %
HCT: 36.1 % (ref 35.0–45.0)
Hemoglobin: 11.7 g/dL (ref 11.7–15.5)
Lymphs Abs: 2310 cells/uL (ref 850–3900)
MCH: 29.3 pg (ref 27.0–33.0)
MCHC: 32.4 g/dL (ref 32.0–36.0)
MCV: 90.3 fL (ref 80.0–100.0)
MPV: 11.2 fL (ref 7.5–12.5)
Monocytes Relative: 10.2 %
Neutro Abs: 5950 cells/uL (ref 1500–7800)
Neutrophils Relative %: 59.5 %
Platelets: 384 10*3/uL (ref 140–400)
RBC: 4 10*6/uL (ref 3.80–5.10)
RDW: 13 % (ref 11.0–15.0)
Total Lymphocyte: 23.1 %
WBC: 10 10*3/uL (ref 3.8–10.8)

## 2019-03-30 LAB — MAGNESIUM: Magnesium: 1.7 mg/dL (ref 1.5–2.5)

## 2019-03-30 LAB — COMPLETE METABOLIC PANEL WITH GFR
AG Ratio: 1.7 (calc) (ref 1.0–2.5)
ALT: 13 U/L (ref 6–29)
AST: 18 U/L (ref 10–35)
Albumin: 4 g/dL (ref 3.6–5.1)
Alkaline phosphatase (APISO): 37 U/L (ref 37–153)
BUN/Creatinine Ratio: 30 (calc) — ABNORMAL HIGH (ref 6–22)
BUN: 27 mg/dL — ABNORMAL HIGH (ref 7–25)
CO2: 33 mmol/L — ABNORMAL HIGH (ref 20–32)
Calcium: 9.5 mg/dL (ref 8.6–10.4)
Chloride: 97 mmol/L — ABNORMAL LOW (ref 98–110)
Creat: 0.91 mg/dL — ABNORMAL HIGH (ref 0.60–0.88)
GFR, Est African American: 66 mL/min/{1.73_m2} (ref >=60)
GFR, Est Non African American: 57 mL/min/{1.73_m2} — ABNORMAL LOW (ref >=60)
Globulin: 2.3 g/dL (calc) (ref 1.9–3.7)
Glucose, Bld: 191 mg/dL — ABNORMAL HIGH (ref 65–99)
Potassium: 4.4 mmol/L (ref 3.5–5.3)
Sodium: 137 mmol/L (ref 135–146)
Total Bilirubin: 0.3 mg/dL (ref 0.2–1.2)
Total Protein: 6.3 g/dL (ref 6.1–8.1)

## 2019-03-30 LAB — LIPID PANEL
Cholesterol: 161 mg/dL (ref <200)
HDL: 45 mg/dL — ABNORMAL LOW (ref >=50)
LDL Cholesterol (Calc): 83 mg/dL (calc)
Non-HDL Cholesterol (Calc): 116 mg/dL (calc) (ref <130)
Total CHOL/HDL Ratio: 3.6 (calc) (ref <5.0)
Triglycerides: 251 mg/dL — ABNORMAL HIGH (ref <150)

## 2019-03-30 LAB — HEMOGLOBIN A1C
Hgb A1c MFr Bld: 8.5 % of total Hgb — ABNORMAL HIGH (ref <5.7)
Mean Plasma Glucose: 197 (calc)
eAG (mmol/L): 10.9 (calc)

## 2019-03-30 LAB — TSH: TSH: 2.38 mIU/L (ref 0.40–4.50)

## 2019-03-30 MED ORDER — MAGNESIUM 400 MG PO CAPS: 1.0000 | ORAL_CAPSULE | Freq: Three times a day (TID) | ORAL | Status: DC

## 2019-03-30 MED ORDER — NOVOLIN 70/30 RELION (70-30) 100 UNIT/ML ~~LOC~~ SUSP: SUBCUTANEOUS | 1 refills | Status: DC

## 2019-04-14 ENCOUNTER — Other Ambulatory Visit: Payer: Self-pay

## 2019-04-14 NOTE — Patient Outreach (Signed)
[facility]) Care Management  04/14/2019  [name] [birth year] [id number]   Medication Adherence call to Mrs. Terrilee Croak spoke with patient son he did want to engage he did say he takes care of his mothers medications but did not want to provide with any information,patient is past due on Metformin 1000 mg and Losartan 100 mg under [facility].   [facility] Direct Dial [phone]  Fax [phone] [email or web address]

## 2019-04-21 ENCOUNTER — Other Ambulatory Visit: Payer: Self-pay | Admitting: Internal Medicine

## 2019-05-18 DIAGNOSIS — B351 Tinea unguium: Secondary | ICD-10-CM | POA: Diagnosis not present

## 2019-05-18 DIAGNOSIS — E1151 Type 2 diabetes mellitus with diabetic peripheral angiopathy without gangrene: Secondary | ICD-10-CM | POA: Diagnosis not present

## 2019-05-18 DIAGNOSIS — M79676 Pain in unspecified toe(s): Secondary | ICD-10-CM | POA: Diagnosis not present

## 2019-05-18 DIAGNOSIS — L84 Corns and callosities: Secondary | ICD-10-CM | POA: Diagnosis not present

## 2019-06-14 ENCOUNTER — Other Ambulatory Visit: Payer: Self-pay

## 2019-06-14 ENCOUNTER — Ambulatory Visit (INDEPENDENT_AMBULATORY_CARE_PROVIDER_SITE_OTHER): Payer: Medicare Other | Admitting: Internal Medicine

## 2019-06-14 ENCOUNTER — Encounter: Payer: Self-pay | Admitting: Internal Medicine

## 2019-06-14 VITALS — BP 134/76 | HR 76 | Temp 97.6°F | Resp 16 | Ht 63.5 in | Wt 204.4 lb

## 2019-06-14 DIAGNOSIS — E1165 Type 2 diabetes mellitus with hyperglycemia: Secondary | ICD-10-CM

## 2019-06-14 DIAGNOSIS — J0140 Acute pansinusitis, unspecified: Secondary | ICD-10-CM

## 2019-06-14 DIAGNOSIS — E1129 Type 2 diabetes mellitus with other diabetic kidney complication: Secondary | ICD-10-CM

## 2019-06-14 MED ORDER — AZITHROMYCIN 250 MG PO TABS: ORAL_TABLET | ORAL | 1 refills | Status: DC

## 2019-06-14 NOTE — Progress Notes (Signed)
History of Present Illness:      This very nice 85 y.o. Catherine Wood  followed for HTN, HLD, T2_IDDM  Prediabetes  and Vitamin D Deficiency is brought in emergently by her son for a few recent CBG reading in the 200-300's , but apparently they were not all fasting. She has been traveling with her son who now manages / supervises her meds. She also has recently c/o PND, yellow & creamy, also sorethroat & dry cough, but denies chest congestion.    Medications  Current Outpatient Medications (Endocrine & Metabolic):    insulin NPH-regular Human (NOVOLIN 70/30 RELION) (70-30) 100 UNIT/ML injection, Inject 48 units into subcutaneous skin 24min before breakfast and 38 units 56min before dinner.   levothyroxine (SYNTHROID) 100 MCG tablet, TAKE (1) TABLET DAILY BE- FORE BREAKFAST.   metFORMIN (GLUCOPHAGE) 1000 MG tablet, TAKE (1) TABLET TWICE A DAY WITH MEALS (BREAKFAST AND SUPPER)  Current Outpatient Medications (Cardiovascular):    fenofibrate 160 MG tablet, TAKE 1 TABLET DAILY   Furosemide 40 MG tablet, TAKE 1 OR 2 TABLETS DAILY AS NEEDED    losartan 100 MG tablet, TAKE 1 TABLET ONCE A DAY   pravastatin  40 MG tablet, TAKE 1 TABLET DAILY FOR CHOLESTEROL   aspirin 81 MG, Take  daily.   VITAMIN D 5000 UNITS , Take daily.    DULoxetine  30 MG, Take 3 daily.   gabapentin 300 MG, 1 CAPSULE MORNING, 2  AFTERNOON & 2 at BEDTIME   Magnesium 400 MG CAPS, Take 1 capsule 3  times daily.   MEGARED OMEGA-3 KRILL OIL 500 MG CAPS, Take 2  times daily.   ICAPS , Take  daily.   Macular Degeneration refresh eye drops  Problem list She has Hypertension; Hypothyroid; CKD stage 3 due to type 2 diabetes mellitus ([place]); Hyperlipidemia associated with type 2 diabetes mellitus ([place]); Macular degeneration; Vitamin D deficiency; Medicare annual wellness visit, initial; Chronic venous insufficiency; Obesity (BMI 30.0-34.9); DDD (degenerative disc disease), lumbar; Cervical spondylosis with myelopathy; Diabetic  polyneuropathy associated with type 2 diabetes mellitus ([place]); Uncontrolled type 2 diabetes mellitus ([place]); Type 2 diabetes mellitus with stage 3 chronic kidney disease, with long-term current use of insulin ([facility]); and Vascular dementia without behavioral disturbance ([facility]) on their problem list.   Observations/Objective:  BP 134/76    Pulse 76    Temp 97.6 F (36.4 C)    Resp 16    Ht 5' 3.5" (1.613 m)    Wt 204 lb 6.4 oz (92.7 kg)    BMI 35.64 kg/m   HEENT -  TM's Nl. N/O/P clear except 1-2 injected w/o exudate. Sl fronto-maxillary tenderness. Neck - supple.  Chest - Clear equal BS. Cor - Nl HS. RRR w/o sig MGR. PP 1(+). No edema. MS- FROM w/o deformities.  Gait Nl. Neuro -  Nl w/o focal abnormalities.  Assessment and Plan:  1. Subacute pansinusitis  - azithromycin (ZITHROMAX) 250 MG tablet; Take 2 tablets on  Day 1,  followed by 1 tablet  once daily on Days 2 through 5.  Disp: 6 each; Refill: 1  2. Poorly controlled type 2 diabetes mellitus with renal complication ([place])  - Advised son to increase Nov 70/30 to 50 u qam & 40 u qpm & if glucose over 200 mg% to take an extra 10 units and continue bid monitoring of fasting glucoses.   Follow Up Instructions:      I discussed the assessment and treatment plan with the patient. The patient was  provided an opportunity to ask questions and all were answered. The patient agreed with the plan and demonstrated an understanding of the instructions. The patient's son  was advised to call back or seek an in-person evaluation if the symptoms worsen or if the condition fails to improve as anticipated. Between 15-20 minutes of counseling, patient exam, chart review, and critical decision making was performed    Kirtland Bouchard, MD

## 2019-06-17 ENCOUNTER — Other Ambulatory Visit: Payer: Self-pay | Admitting: Internal Medicine

## 2019-06-17 DIAGNOSIS — Z1231 Encounter for screening mammogram for malignant neoplasm of breast: Secondary | ICD-10-CM

## 2019-06-28 ENCOUNTER — Encounter: Payer: Self-pay | Admitting: Internal Medicine

## 2019-07-01 ENCOUNTER — Ambulatory Visit (INDEPENDENT_AMBULATORY_CARE_PROVIDER_SITE_OTHER): Payer: Medicare Other | Admitting: Internal Medicine

## 2019-07-01 ENCOUNTER — Other Ambulatory Visit: Payer: Self-pay

## 2019-07-01 ENCOUNTER — Encounter: Payer: Self-pay | Admitting: Internal Medicine

## 2019-07-01 VITALS — BP 126/60 | HR 80 | Temp 97.0°F | Resp 16 | Ht 63.5 in | Wt 203.4 lb

## 2019-07-01 DIAGNOSIS — N1832 Chronic kidney disease, stage 3b: Secondary | ICD-10-CM | POA: Diagnosis not present

## 2019-07-01 DIAGNOSIS — E039 Hypothyroidism, unspecified: Secondary | ICD-10-CM

## 2019-07-01 DIAGNOSIS — E1169 Type 2 diabetes mellitus with other specified complication: Secondary | ICD-10-CM | POA: Diagnosis not present

## 2019-07-01 DIAGNOSIS — Z131 Encounter for screening for diabetes mellitus: Secondary | ICD-10-CM | POA: Diagnosis not present

## 2019-07-01 DIAGNOSIS — Z1329 Encounter for screening for other suspected endocrine disorder: Secondary | ICD-10-CM | POA: Diagnosis not present

## 2019-07-01 DIAGNOSIS — Z13220 Encounter for screening for lipoid disorders: Secondary | ICD-10-CM | POA: Diagnosis not present

## 2019-07-01 DIAGNOSIS — Z79899 Other long term (current) drug therapy: Secondary | ICD-10-CM

## 2019-07-01 DIAGNOSIS — F0150 Vascular dementia without behavioral disturbance: Secondary | ICD-10-CM

## 2019-07-01 DIAGNOSIS — Z1389 Encounter for screening for other disorder: Secondary | ICD-10-CM | POA: Diagnosis not present

## 2019-07-01 DIAGNOSIS — Z136 Encounter for screening for cardiovascular disorders: Secondary | ICD-10-CM | POA: Diagnosis not present

## 2019-07-01 DIAGNOSIS — N183 Chronic kidney disease, stage 3 unspecified: Secondary | ICD-10-CM

## 2019-07-01 DIAGNOSIS — E559 Vitamin D deficiency, unspecified: Secondary | ICD-10-CM

## 2019-07-01 DIAGNOSIS — E1165 Type 2 diabetes mellitus with hyperglycemia: Secondary | ICD-10-CM

## 2019-07-01 DIAGNOSIS — I10 Essential (primary) hypertension: Secondary | ICD-10-CM

## 2019-07-01 DIAGNOSIS — E1142 Type 2 diabetes mellitus with diabetic polyneuropathy: Secondary | ICD-10-CM | POA: Diagnosis not present

## 2019-07-01 DIAGNOSIS — Z0000 Encounter for general adult medical examination without abnormal findings: Secondary | ICD-10-CM | POA: Diagnosis not present

## 2019-07-01 DIAGNOSIS — Z0001 Encounter for general adult medical examination with abnormal findings: Secondary | ICD-10-CM

## 2019-07-01 DIAGNOSIS — E1122 Type 2 diabetes mellitus with diabetic chronic kidney disease: Secondary | ICD-10-CM

## 2019-07-01 DIAGNOSIS — Z1211 Encounter for screening for malignant neoplasm of colon: Secondary | ICD-10-CM

## 2019-07-01 DIAGNOSIS — E1121 Type 2 diabetes mellitus with diabetic nephropathy: Secondary | ICD-10-CM | POA: Diagnosis not present

## 2019-07-01 DIAGNOSIS — K219 Gastro-esophageal reflux disease without esophagitis: Secondary | ICD-10-CM

## 2019-07-01 NOTE — Progress Notes (Signed)
Annual Screening/Preventative Visit & Comprehensive Evaluation &  Examination     This very nice 85 y.o. WWFpresents for a Screening /Preventative Visit & comprehensive evaluation and management of multiple medical co-morbidities.  Patient has been followed for HTN, HLD, T2_IDDM  and Vitamin D Deficiency.      HTN predates circa 1994. Patient's BP has been controlled at home and patient denies any cardiac symptoms as chest pain, palpitations, shortness of breath, dizziness or ankle swelling. Today's BP is at goal - 126/60.      Patient's hyperlipidemia is controlled with diet and medications. Patient denies myalgias or other medication SE's. Last lipids were at goal albeit elevated Trig's:  Lab Results  Component Value Date   CHOL 161 03/29/2019   HDL 45 (L) 03/29/2019   LDLCALC 83 03/29/2019   TRIG 251 (H) 03/29/2019   CHOLHDL 3.6 03/29/2019      Patient has hx/o T2_IDDM / CKD3  predating since 2003. Patient is on Metformin and Novolin 70/30  Bid.   Patient has peripheral sensory Neuropathy and diabetic retinopathy. 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 Patient denies reactive hypoglycemic symptoms,  diabetic polys. Last A1c was not at goal:  Lab Results  Component Value Date   HGBA1C 8.5 (H) 03/29/2019      Patient had Goiter  surgery in the 1970's and has been on Replacement since.     Finally, patient has history of Vitamin D Deficiency and last Vitamin D was still very low (goal 70-100):   Lab Results  Component Value Date   VD25OH 38 05/29/2018   Current Outpatient Medications on File Prior to Visit  Medication Sig  . ACCU-CHEK SOFTCLIX LANCETS lancets CHECK BLOOD SUGER UP TO 3 TIMES A DAY  . aspirin 81 MG tablet Take 81 mg by mouth daily.  . Cholecalciferol (VITAMIN D3) 5000 UNITS CAPS Take 5,000 Units by mouth daily.   . DULoxetine (CYMBALTA) 30 MG capsule Take 30 mg by mouth daily.  . fenofibrate 160 MG tablet  TAKE 1 TABLET DAILY  . furosemide (LASIX) 40 MG tablet TAKE 1 OR 2 TABLETS DAILY AS NEEDED FOR SWELLING/EDEMA  . gabapentin (NEURONTIN) 300 MG capsule 1 CAPSULE EACH MORNING, 2 EACH AFTERNOON, AND 2 AT BEDTIME  . insulin NPH-regular Human (NOVOLIN 70/30 RELION) (70-30) 100 UNIT/ML injection Inject 48 units into subcutaneous skin 26min before breakfast and 38 units 60min before dinner.  . Insulin Pen Needle (PEN NEEDLES 31GX5/16") 31G X 8 MM MISC USE 4 TIMES A DAY  . levothyroxine (SYNTHROID) 100 MCG tablet TAKE (1) TABLET DAILY BE- FORE BREAKFAST.  Marland Kitchen losartan (COZAAR) 100 MG tablet TAKE 1 TABLET ONCE A DAY FOR HIGH BLOOD PRESSURE  . Magnesium 400 MG CAPS Take 1 capsule by mouth 3 (three) times daily.  Marland Kitchen MEGARED OMEGA-3 KRILL OIL 500 MG CAPS Take 500 mg by mouth 2 (two) times daily.  . metFORMIN (GLUCOPHAGE) 1000 MG tablet TAKE (1) TABLET TWICE A DAY WITH MEALS (BREAKFAST AND SUPPER)  . Multiple Vitamins-Minerals (ICAPS PO) Take by mouth daily.  Marland Kitchen OVER THE COUNTER MEDICATION as needed. Macular Degeneration refresh eye drops  . pravastatin (PRAVACHOL) 40 MG tablet TAKE 1 TABLET DAILY FOR CHOLESTEROL   No current facility-administered medications on file prior to visit.    Allergies  Allergen Reactions  . Detrol [Tolterodine]    Past Medical History:  Diagnosis Date  . Arthritis   . GERD (gastroesophageal reflux disease)   .  HOH (hard of hearing)   . Hyperlipidemia   . Hypertension   . Hypothyroid   . IDDM (insulin dependent diabetes mellitus)   . Leg swelling   . Macular degeneration   . Shortness of breath dyspnea    with exertion  . Sleep apnea    does not wear CPAP  . Varicose veins   . Wears glasses    Health Maintenance  Topic Date Due  . OPHTHALMOLOGY EXAM  06/16/2018  . HEMOGLOBIN A1C  09/29/2019  . FOOT EXAM  06/30/2020  . TETANUS/TDAP  10/20/2024  . INFLUENZA VACCINE  Completed  . DEXA SCAN  Completed  . PNA vac Low Risk Adult  Completed   Immunization History   Administered Date(s) Administered  . DT 10/20/2014  . Influenza Split 06/04/2017  . Influenza, High Dose Seasonal PF 07/01/2016, 05/29/2018  . Influenza-Unspecified 06/22/2013, 06/16/2014, 06/27/2014, 07/06/2015, 06/09/2019  . Pneumococcal Conjugate-13 04/05/2014  . Pneumococcal-Unspecified 09/09/2004  . Td 09/10/2003  . Zoster 09/09/2005   Last Colon -  04/12/2015 - Dr Watt Climes - deferred f/u due to age  Last MGM - 06/02/2018 and scheduled 08/03/2019  Past Surgical History:  Procedure Laterality Date  . ANTERIOR CERVICAL DECOMP/DISCECTOMY FUSION N/A 11/08/2014   Procedure: Cervical three-four,  Cervical six-seven anterior cervical decompression with fusion plating and bonegraft;  Surgeon: Ashok Pall, MD;  Location: [facility];  Service: Neurosurgery;  Laterality: N/A;  Cervical three-four,  Cervical six-seven anterior cervical decompression with fusion plating and bonegraft  . BIOPSY THYROID     benign thyroid nodule removal  . CATARACT EXTRACTION, BILATERAL    . COLONOSCOPY W/ BIOPSIES AND POLYPECTOMY    . ENDOMETRIAL BIOPSY  2000  . SPINE SURGERY Left  11/08/2014   Left ACDF   . TUBAL LIGATION    . VARICOSE VEIN SURGERY     Family History  Problem Relation Age of Onset  . Cancer Mother        colon  . Cancer Father        GI  . Breast cancer Neg Hx    Social History   Tobacco Use  . Smoking status: Never Smoker  . Smokeless tobacco: Never Used  Substance Use Topics  . Alcohol use: No  . Drug use: No    ROS Constitutional: Denies fever, chills, weight loss/gain, headaches, insomnia,  night sweats, and change in appetite. Does c/o fatigue. Eyes: Denies redness, blurred vision, diplopia, discharge, itchy, watery eyes.  ENT: Denies discharge, congestion, post nasal drip, epistaxis, sore throat, earache, hearing loss, dental pain, Tinnitus, Vertigo, Sinus pain, snoring.  Cardio: Denies chest pain, palpitations, irregular heartbeat, syncope, dyspnea, diaphoresis, orthopnea,  PND, claudication, edema Respiratory: denies cough, dyspnea, DOE, pleurisy, hoarseness, laryngitis, wheezing.  Gastrointestinal: Denies dysphagia, heartburn, reflux, water brash, pain, cramps, nausea, vomiting, bloating, diarrhea, constipation, hematemesis, melena, hematochezia, jaundice, hemorrhoids Genitourinary: Denies dysuria, frequency, urgency, nocturia, hesitancy, discharge, hematuria, flank pain Breast: Breast lumps, nipple discharge, bleeding.  Musculoskeletal: Denies arthralgia, myalgia, stiffness, Jt. Swelling, pain, limp, and strain/sprain. Denies falls. Skin: Denies puritis, rash, hives, warts, acne, eczema, changing in skin lesion Neuro: No weakness, tremor, incoordination, spasms, paresthesia, pain Psychiatric: Denies confusion, memory loss, sensory loss. Denies Depression. Endocrine: Denies change in weight, skin, hair change, nocturia, and paresthesia, diabetic polys, visual blurring, hyper / hypo glycemic episodes.  Heme/Lymph: No excessive bleeding, bruising, enlarged lymph nodes.  Physical Exam  BP 126/60   Pulse 80   Temp (!) 97 F (36.1 C)   Resp 16  Ht 5' 3.5" (1.613 m)   Wt 203 lb 6.4 oz (92.3 kg)   BMI 35.47 kg/m   General Appearance: Well nourished, well groomed and in no apparent distress.  Eyes: PERRLA, EOMs, conjunctiva no swelling or erythema, normal fundi and vessels. Sinuses: No frontal/maxillary tenderness ENT/Mouth: EACs patent / TMs  nl. Nares clear without erythema, swelling, mucoid exudates. Oral hygiene is good. No erythema, swelling, or exudate. Tongue normal, non-obstructing. Tonsils not swollen or erythematous. Hearing normal.  Neck: Supple, thyroid not palpable. No bruits, nodes or JVD. Respiratory: Respiratory effort normal.  BS equal and clear bilateral without rales, rhonci, wheezing or stridor. Cardio: Heart sounds are normal with regular rate and rhythm and no murmurs, rubs or gallops. Peripheral pulses are normal and equal bilaterally  without edema. No aortic or femoral bruits. Chest: symmetric with normal excursions and percussion. Breasts: Symmetric, without lumps, nipple discharge, retractions, or fibrocystic changes.  Abdomen: Flat, soft with bowel sounds active. Nontender, no guarding, rebound, hernias, masses, or organomegaly.  Lymphatics: Non tender without lymphadenopathy.  Genitourinary:  Musculoskeletal: Full ROM all peripheral extremities, joint stability, 5/5 strength, and normal gait. Skin: Warm and dry without rashes, lesions, cyanosis, clubbing or  ecchymosis.  Neuro: Cranial nerves intact, reflexes equal bilaterally. Normal muscle tone, no cerebellar symptoms. Sensation intact.  Pysch: Alert and oriented X 3, normal affect, Insight and Judgment appropriate.   Assessment and Plan  1. Annual Preventative Screening Examination  2. Essential hypertension  - EKG 12-Lead - [place], RETROPERITNL ABD,  LTD - Urinalysis, Routine w reflex microscopic - CBC with Differential/Platelet - COMPLETE METABOLIC PANEL WITH GFR - Magnesium - TSH  3. Hyperlipidemia associated with type 2 diabetes mellitus ([place])  - EKG 12-Lead - [place], RETROPERITNL ABD,  LTD - Lipid panel - TSH  4. Type 2 diabetes mellitus with stage 3b chronic kidney disease, with long-term current use of insulin (HCC)  - EKG 12-Lead - [place], RETROPERITNL ABD,  LTD - Urinalysis, Routine w reflex microscopic - Microalbumin / creatinine urine ratio - Hemoglobin A1c  5. Vitamin D deficiency   6. Hypothyroidism  - TSH - VITAMIN D 25 Hydroxyl  7. Gastroesophageal reflux disease  8. Diabetic polyneuropathy associated with  type 2 diabetes mellitus (HCC)  - LOW EXTREMITY NEUR EXAM DOCUM - HM DIABETES FOOT EXAM - Hemoglobin A1c  9. Screening for colorectal cancer  - POC Hemoccult Bld/Stl    10. Screening for ischemic heart disease  - EKG 12-Lead - VITAMIN D 25 Hydroxyl  11. Medication management  - Urinalysis, Routine w reflex  microscopic - Microalbumin / creatinine urine ratio - Uric acid - CBC with Differential/Platelet - COMPLETE METABOLIC PANEL WITH GFR - Magnesium - Lipid panel - TSH - Hemoglobin A1c - VITAMIN D 25 Hydroxyl         Patient was counseled in prudent diet to achieve/maintain BMI less than 25 for weight control, BP monitoring, regular exercise and medications. Discussed med's effects and SE's. Screening labs and tests as requested with regular follow-up as recommended. Over 40 minutes of exam, counseling, chart review and high complex critical decision making was performed.   Kirtland Bouchard, MD

## 2019-07-01 NOTE — Patient Instructions (Signed)

## 2019-07-02 DIAGNOSIS — D1801 Hemangioma of skin and subcutaneous tissue: Secondary | ICD-10-CM | POA: Diagnosis not present

## 2019-07-02 DIAGNOSIS — L739 Follicular disorder, unspecified: Secondary | ICD-10-CM | POA: Diagnosis not present

## 2019-07-02 DIAGNOSIS — L570 Actinic keratosis: Secondary | ICD-10-CM | POA: Diagnosis not present

## 2019-07-02 DIAGNOSIS — D692 Other nonthrombocytopenic purpura: Secondary | ICD-10-CM | POA: Diagnosis not present

## 2019-07-02 DIAGNOSIS — L820 Inflamed seborrheic keratosis: Secondary | ICD-10-CM | POA: Diagnosis not present

## 2019-07-02 DIAGNOSIS — L821 Other seborrheic keratosis: Secondary | ICD-10-CM | POA: Diagnosis not present

## 2019-07-02 LAB — URINALYSIS, ROUTINE W REFLEX MICROSCOPIC
Bacteria, UA: NONE SEEN /HPF
Bilirubin Urine: NEGATIVE
Glucose, UA: NEGATIVE
Hgb urine dipstick: NEGATIVE
Hyaline Cast: NONE SEEN /LPF
Ketones, ur: NEGATIVE
Nitrite: NEGATIVE
Protein, ur: NEGATIVE
RBC / HPF: NONE SEEN /HPF (ref 0–2)
Specific Gravity, Urine: 1.011 (ref 1.001–1.03)
Squamous Epithelial / HPF: NONE SEEN /HPF (ref <=5)
pH: 7 (ref 5.0–8.0)

## 2019-07-02 LAB — COMPLETE METABOLIC PANEL WITH GFR
AG Ratio: 1.7 (calc) (ref 1.0–2.5)
ALT: 14 U/L (ref 6–29)
AST: 21 U/L (ref 10–35)
Albumin: 4.3 g/dL (ref 3.6–5.1)
Alkaline phosphatase (APISO): 33 U/L — ABNORMAL LOW (ref 37–153)
BUN/Creatinine Ratio: 32 (calc) — ABNORMAL HIGH (ref 6–22)
BUN: 42 mg/dL — ABNORMAL HIGH (ref 7–25)
CO2: 34 mmol/L — ABNORMAL HIGH (ref 20–32)
Calcium: 9.5 mg/dL (ref 8.6–10.4)
Chloride: 97 mmol/L — ABNORMAL LOW (ref 98–110)
Creat: 1.32 mg/dL — ABNORMAL HIGH (ref 0.60–0.88)
GFR, Est African American: 42 mL/min/{1.73_m2} — ABNORMAL LOW (ref >=60)
GFR, Est Non African American: 36 mL/min/{1.73_m2} — ABNORMAL LOW (ref >=60)
Globulin: 2.6 g/dL (calc) (ref 1.9–3.7)
Glucose, Bld: 60 mg/dL — ABNORMAL LOW (ref 65–99)
Potassium: 4.4 mmol/L (ref 3.5–5.3)
Sodium: 141 mmol/L (ref 135–146)
Total Bilirubin: 0.3 mg/dL (ref 0.2–1.2)
Total Protein: 6.9 g/dL (ref 6.1–8.1)

## 2019-07-02 LAB — LIPID PANEL
Cholesterol: 193 mg/dL (ref <200)
HDL: 46 mg/dL — ABNORMAL LOW (ref >=50)
LDL Cholesterol (Calc): 108 mg/dL (calc) — ABNORMAL HIGH
Non-HDL Cholesterol (Calc): 147 mg/dL (calc) — ABNORMAL HIGH (ref <130)
Total CHOL/HDL Ratio: 4.2 (calc) (ref <5.0)
Triglycerides: 273 mg/dL — ABNORMAL HIGH (ref <150)

## 2019-07-02 LAB — CBC WITH DIFFERENTIAL/PLATELET
Absolute Monocytes: 1015 cells/uL — ABNORMAL HIGH (ref 200–950)
Basophils Absolute: 47 cells/uL (ref 0–200)
Basophils Relative: 0.5 %
Eosinophils Absolute: 479 cells/uL (ref 15–500)
Eosinophils Relative: 5.1 %
HCT: 39.1 % (ref 35.0–45.0)
Hemoglobin: 12.7 g/dL (ref 11.7–15.5)
Lymphs Abs: 2425 cells/uL (ref 850–3900)
MCH: 28.9 pg (ref 27.0–33.0)
MCHC: 32.5 g/dL (ref 32.0–36.0)
MCV: 89.1 fL (ref 80.0–100.0)
MPV: 11.8 fL (ref 7.5–12.5)
Monocytes Relative: 10.8 %
Neutro Abs: 5433 cells/uL (ref 1500–7800)
Neutrophils Relative %: 57.8 %
Platelets: 389 10*3/uL (ref 140–400)
RBC: 4.39 10*6/uL (ref 3.80–5.10)
RDW: 12.2 % (ref 11.0–15.0)
Total Lymphocyte: 25.8 %
WBC: 9.4 10*3/uL (ref 3.8–10.8)

## 2019-07-02 LAB — MICROALBUMIN / CREATININE URINE RATIO
Creatinine, Urine: 54 mg/dL (ref 20–275)
Microalb Creat Ratio: 22 mcg/mg creat (ref <30)
Microalb, Ur: 1.2 mg/dL

## 2019-07-02 LAB — URIC ACID: Uric Acid, Serum: 6.3 mg/dL (ref 2.5–7.0)

## 2019-07-02 LAB — HEMOGLOBIN A1C
Hgb A1c MFr Bld: 7.2 % of total Hgb — ABNORMAL HIGH (ref <5.7)
Mean Plasma Glucose: 160 (calc)
eAG (mmol/L): 8.9 (calc)

## 2019-07-02 LAB — TSH: TSH: 3.05 mIU/L (ref 0.40–4.50)

## 2019-07-02 LAB — MAGNESIUM: Magnesium: 2 mg/dL (ref 1.5–2.5)

## 2019-07-02 LAB — VITAMIN D 25 HYDROXY (VIT D DEFICIENCY, FRACTURES): Vit D, 25-Hydroxy: 27 ng/mL — ABNORMAL LOW (ref 30–100)

## 2019-07-04 ENCOUNTER — Encounter: Payer: Self-pay | Admitting: Internal Medicine

## 2019-07-08 ENCOUNTER — Other Ambulatory Visit: Payer: Self-pay

## 2019-07-08 NOTE — Patient Outreach (Signed)
[facility]) Care Management  07/08/2019  BIATRIZ HUSIC 03/05/1931 [id number]   Medication Adherence call to Mrs. Rudy spoke with patient son he explain patient is takeing Pravastatin 40 mg once daily,he explain she has enough for 6 more days,he will place

## 2019-07-12 DIAGNOSIS — H3562 Retinal hemorrhage, left eye: Secondary | ICD-10-CM | POA: Diagnosis not present

## 2019-07-12 DIAGNOSIS — H353221 Exudative age-related macular degeneration, left eye, with active choroidal neovascularization: Secondary | ICD-10-CM | POA: Diagnosis not present

## 2019-07-12 DIAGNOSIS — H43812 Vitreous degeneration, left eye: Secondary | ICD-10-CM | POA: Diagnosis not present

## 2019-07-12 LAB — HM DIABETES EYE EXAM

## 2019-07-14 ENCOUNTER — Other Ambulatory Visit: Payer: Self-pay | Admitting: Adult Health

## 2019-07-14 DIAGNOSIS — E782 Mixed hyperlipidemia: Secondary | ICD-10-CM

## 2019-07-21 ENCOUNTER — Other Ambulatory Visit: Payer: Self-pay | Admitting: Adult Health

## 2019-07-27 DIAGNOSIS — M79676 Pain in unspecified toe(s): Secondary | ICD-10-CM | POA: Diagnosis not present

## 2019-07-27 DIAGNOSIS — L84 Corns and callosities: Secondary | ICD-10-CM | POA: Diagnosis not present

## 2019-07-27 DIAGNOSIS — E1151 Type 2 diabetes mellitus with diabetic peripheral angiopathy without gangrene: Secondary | ICD-10-CM | POA: Diagnosis not present

## 2019-07-27 DIAGNOSIS — B351 Tinea unguium: Secondary | ICD-10-CM | POA: Diagnosis not present

## 2019-08-03 ENCOUNTER — Ambulatory Visit
Admission: RE | Admit: 2019-08-03 | Discharge: 2019-08-03 | Disposition: A | Discharge status: Home / Self Care | Payer: Medicare Other | Source: Ambulatory Visit | Attending: Internal Medicine | Admitting: Internal Medicine

## 2019-08-03 ENCOUNTER — Other Ambulatory Visit: Payer: Self-pay

## 2019-08-03 DIAGNOSIS — Z1231 Encounter for screening mammogram for malignant neoplasm of breast: Secondary | ICD-10-CM

## 2019-08-04 ENCOUNTER — Other Ambulatory Visit: Payer: Self-pay | Admitting: Adult Health

## 2019-08-19 DIAGNOSIS — M5136 Other intervertebral disc degeneration, lumbar region: Secondary | ICD-10-CM | POA: Diagnosis not present

## 2019-08-31 ENCOUNTER — Other Ambulatory Visit: Payer: Self-pay | Admitting: Internal Medicine

## 2019-08-31 DIAGNOSIS — E1122 Type 2 diabetes mellitus with diabetic chronic kidney disease: Secondary | ICD-10-CM

## 2019-08-31 DIAGNOSIS — N183 Chronic kidney disease, stage 3 unspecified: Secondary | ICD-10-CM

## 2019-09-22 ENCOUNTER — Other Ambulatory Visit: Payer: Self-pay | Admitting: Internal Medicine

## 2019-09-22 ENCOUNTER — Other Ambulatory Visit: Payer: Self-pay | Admitting: Adult Health

## 2019-09-22 DIAGNOSIS — N183 Chronic kidney disease, stage 3 unspecified: Secondary | ICD-10-CM

## 2019-09-22 DIAGNOSIS — E1122 Type 2 diabetes mellitus with diabetic chronic kidney disease: Secondary | ICD-10-CM

## 2019-09-22 MED ORDER — NOVOLIN 70/30 RELION (70-30) 100 UNIT/ML ~~LOC~~ SUSP: SUBCUTANEOUS | 3 refills | Status: DC

## 2019-10-04 DIAGNOSIS — H353134 Nonexudative age-related macular degeneration, bilateral, advanced atrophic with subfoveal involvement: Secondary | ICD-10-CM | POA: Diagnosis not present

## 2019-10-04 DIAGNOSIS — H3562 Retinal hemorrhage, left eye: Secondary | ICD-10-CM | POA: Diagnosis not present

## 2019-10-04 DIAGNOSIS — H35341 Macular cyst, hole, or pseudohole, right eye: Secondary | ICD-10-CM | POA: Diagnosis not present

## 2019-10-04 DIAGNOSIS — H353221 Exudative age-related macular degeneration, left eye, with active choroidal neovascularization: Secondary | ICD-10-CM | POA: Diagnosis not present

## 2019-10-05 DIAGNOSIS — E1151 Type 2 diabetes mellitus with diabetic peripheral angiopathy without gangrene: Secondary | ICD-10-CM | POA: Diagnosis not present

## 2019-10-05 DIAGNOSIS — B351 Tinea unguium: Secondary | ICD-10-CM | POA: Diagnosis not present

## 2019-10-05 DIAGNOSIS — L84 Corns and callosities: Secondary | ICD-10-CM | POA: Diagnosis not present

## 2019-10-05 DIAGNOSIS — M79676 Pain in unspecified toe(s): Secondary | ICD-10-CM | POA: Diagnosis not present

## 2019-10-06 NOTE — Progress Notes (Signed)
MEDICARE ANNUAL WELLNESS VISIT AND FOLLOW UP Assessment:   Catherine Wood was seen today for medicare wellness.  Diagnoses and all orders for this visit:  Medicare annual wellness visit Yearly  Essential hypertension Continue medications; typically at goal  Monitor blood pressure; call if consistently over 130/80 Continue DASH diet.   Reminder to go to the ER if any CP, SOB, nausea, dizziness, severe HA, changes vision/speech, left arm numbness and tingling and jaw pain.  Mixed hyperlipidemia Continue medications: pravachol 40mg , switch to rosuvastatin if remains above LDL goal <70  Continue low cholesterol diet and exercise.  Check lipid panel.  -     pravastatin (PRAVACHOL) 40 MG tablet; Take 1 tablet (40 mg total) by mouth daily. for cholesterol.  Take at bedtime.  CKD stage 3 due to type 2 diabetes mellitus (HCC) Increase fluids, avoid NSAIDS, monitor sugars, will monitor  Hypothyroidism, unspecified type Taking levothyroxine 158mcg Education provided about when to take medicine. Take 30-76min first thing in am with water prior to food or other medications. Discussed with patient and daughter. Will check labs next office visit  Diabetic polyneuropathy associated with type 2 diabetes mellitus ([place]) Taking gabapentin & Duloxetine 30mg  Discussed checking feet daily and well fitting shoes Follows with podiatry   DDD (degenerative disc disease), lumbar Managing at this time Not taking any medications for this  Type 2 diabetes mellitus with stage 3 chronic kidney disease, with long-term current use of insulin (HCC) Patient taking 70/30 insulin 50 units AM, 40 units PM, son titrates as needed +/- 5 units Metformin 1000mg  twice a day- if GFR remains <45 will reduce dose Discussed dietary and exercise modifications Discussed general issues about diabetes pathophysiology and management., Educational material distributed., Suggested low cholesterol diet., Encouraged aerobic exercise.,  Discussed foot care., Reminded to get yearly retinal exam.  Morbid obesity ([place]) Discussed dietary and exercise modifications, initial weight goal <200 lb  Macular degeneration, unspecified laterality, unspecified type Has had yearly retinal exam Dr Herbert Deaner & Dr Zadie Rhine for this  Vitamin D deficiency Continue supplementation Will check labs net office visit   Medication management continued  Vascular dementia without behavioral disturbance [facility]) Son assists; declines neuro referral  No needs at this time; son/daughter are monitoring closely  Control blood pressure, cholesterol, glucose, increase exercise.      I discussed the assessment and treatment plan with the patient. The patient was provided an opportunity to ask questions and all were answered. The patient agreed with the plan and demonstrated an understanding of the instructions.   The patient was advised to call back or seek an in-person evaluation if the symptoms worsen or if the condition fails to improve as anticipated.  I provided 30 minutes of non-face-to-face time during this encounter including counseling, chart review, and critical decision making was preformed.   Future Appointments  Date Time Provider [place]  01/13/2020  2:30 PM [name], MD [facility] None  08/07/2020  2:00 PM [name], MD [facility] None      Plan:   During the course of the visit the patient was educated and counseled about appropriate screening and preventive services including:    Pneumococcal vaccine   Influenza vaccine  Prevnar 13  Td vaccine  Screening electrocardiogram, deferred, telephone visit [place].  Colorectal cancer screening  Diabetes screening  Glaucoma screening  Nutrition counseling    Subjective:  Catherine Wood is a 86 y.o. female who presents for Medicare Annual Wellness Visit and 3 month follow up for HTN, hyperlipidemia,  DMII, polyneuropathy, hypothyroidism and vitamin D  Def.   She is accompanied by her son, who has been taking care of medications, lives next to her and assists with medications 3 times a day. Daughter also checks in on her regularly.  She has some dementia; MRI from 10/2014 showed atrophy and small vessel disease. Has declined neuro referral. Walks with cane but no falls. Typically driven by family but can drive short distances.   She has neuropathy and takes cymbalta 30 mg daily, gabapentin 300 mg 1 cap AM, 2 caps in afternoon, 2 caps PM  BMI is Body mass index is 36.93 kg/m., she has not been working on diet and exercise, admits she likes to eat but willing to work on portions and start walking a bit.  Wt Readings from Last 3 Encounters:  10/07/19 211 lb 12.8 oz (96.1 kg)  07/01/19 203 lb 6.4 oz (92.3 kg)  06/14/19 204 lb 6.4 oz (92.7 kg)   Her blood pressure has been controlled at home, today their BP is BP: (!) 146/72  She does not workout. She denies chest pain, shortness of breath, dizziness.  She is on cholesterol medication (pravastatin 40 mg daily) and denies myalgias. Her cholesterol is not at goal. The cholesterol last visit was:   Lab Results  Component Value Date   CHOL 193 07/01/2019   HDL 46 (L) 07/01/2019   LDLCALC 108 (H) 07/01/2019   TRIG 273 (H) 07/01/2019   CHOLHDL 4.2 07/01/2019   She has been working on diet and exercise for DMII with CKD IIIb, neuropathy Novolin 70/30 50 units AM and 40 units in the PM, son will titrate +/- 5 units if needed Recent fasting average 130-140 per son  She is on ASA, Statin, ARB Denies hyperglycemia, hypoglycemia , increased appetite, nausea, paresthesia of the feet, polydipsia, polyuria, visual disturbances and vomiting.  Last A1C in the office was:  Lab Results  Component Value Date   HGBA1C 7.2 (H) 07/01/2019   She has CKD III,  associated with T2DM and htn, GFR typically in 50s. Last GFR:  Lab Results  Component Value Date   GFRNONAA 36 (L) 07/01/2019   Patient is on  Vitamin D supplement, taking 5000 IU daily    Lab Results  Component Value Date   VD25OH 27 (L) 07/01/2019     She is on thyroid medication. Her medication was not changed last visit.  She is taking 151mcg daily. Lab Results  Component Value Date   TSH 3.05 07/01/2019  .  Medication Review:  Current Outpatient Medications (Endocrine & Metabolic):  .  insulin NPH-regular Human (NOVOLIN 70/30 RELION) (70-30) 100 UNIT/ML injection, Inject 50 units into skin 2 x /day before meals or as directed .  levothyroxine (SYNTHROID) 100 MCG tablet, TAKE (1) TABLET DAILY BE- FORE BREAKFAST. .  metFORMIN (GLUCOPHAGE) 1000 MG tablet, TAKE (1) TABLET TWICE A DAY WITH MEALS (BREAKFAST AND SUPPER)  Current Outpatient Medications (Cardiovascular):  .  fenofibrate 160 MG tablet, TAKE 1 TABLET DAILY .  furosemide (LASIX) 40 MG tablet, Take 1 or 2 tablets Daily If Needed for Fluid Retention  / Ankle Swelling .  losartan (COZAAR) 100 MG tablet, TAKE 1 TABLET ONCE A DAY FOR HIGH BLOOD PRESSURE .  pravastatin (PRAVACHOL) 40 MG tablet, TAKE 1 TABLET DAILY FOR CHOLESTEROL   Current Outpatient Medications (Analgesics):  .  aspirin 81 MG tablet, Take 81 mg by mouth daily.   Current Outpatient Medications (Other):  Marland Kitchen  ACCU-CHEK SOFTCLIX LANCETS  lancets, CHECK BLOOD SUGER UP TO 3 TIMES A DAY .  Cholecalciferol (VITAMIN D3) 5000 UNITS CAPS, Take 5,000 Units by mouth daily.  .  DULoxetine (CYMBALTA) 30 MG capsule, Take 30 mg by mouth daily. Marland Kitchen  gabapentin (NEURONTIN) 300 MG capsule, 1 CAPSULE EACH MORNING, 2 EACH AFTERNOON, AND 2 AT BEDTIME .  Insulin Pen Needle (PEN NEEDLES 31GX5/16") 31G X 8 MM MISC, USE 4 TIMES A DAY .  Magnesium 400 MG CAPS, Take 1 capsule by mouth 3 (three) times daily. Marland Kitchen  MEGARED OMEGA-3 KRILL OIL 500 MG CAPS, Take 500 mg by mouth 2 (two) times daily. .  Multiple Vitamins-Minerals (ICAPS PO), Take by mouth daily. Marland Kitchen  OVER THE COUNTER MEDICATION, as needed. Macular Degeneration refresh eye  drops  Allergies: Allergies  Allergen Reactions  . Detrol [Tolterodine]     Current Problems (verified) has Essential hypertension; Hypothyroidism; CKD stage 3 due to type 2 diabetes mellitus ([place]); Hyperlipidemia associated with type 2 diabetes mellitus ([place]); Macular degeneration; Vitamin D deficiency; Chronic venous insufficiency; Obesity (BMI 30.0-34.9); DDD (degenerative disc disease), lumbar; Cervical spondylosis with myelopathy; Diabetic polyneuropathy associated with type 2 diabetes mellitus ([place]); Type 2 diabetes mellitus with stage 3b chronic kidney disease, with long-term current use of insulin ([place]); and Vascular dementia without behavioral disturbance ([place]) on their problem list.  Screening Tests Immunization History  Administered Date(s) Administered  . DT (Pediatric) 10/20/2014  . Influenza Split 06/04/2017  . Influenza, High Dose Seasonal PF 07/01/2016, 05/29/2018  . Influenza-Unspecified 06/22/2013, 06/16/2014, 06/27/2014, 07/06/2015, 06/09/2019  . Pneumococcal Conjugate-13 04/05/2014  . Pneumococcal-Unspecified 09/09/2004  . Td 09/10/2003  . Zoster 09/09/2005    Preventative care: Last colonoscopy: 2016 Mammogram: 07/2019 DEXA: 2013 ordered   Prior vaccinations: TD or Tdap: 2016  Influenza: 2020 Pneumococcal: 2006 Prevnar13: 2015 Shingles/Zostavax: 2007  Names of Other Physician/Practitioners you currently use: 1. [facility] here for primary care 2. Dr Zadie Rhine, last eye exam ?, DUE, reminded  3. Dr Lynnette Caffey, 2020.  Patient Care Team: [name], MD as PCP - General (Internal Medicine) Clarene Essex, MD as Consulting Physician (Gastroenterology) Stanford Breed Denice Bors, MD as Consulting Physician (Cardiology) Monna Fam, MD as Consulting Physician (Ophthalmology) Zadie Rhine Clent Demark, MD as Consulting Physician (Ophthalmology) Deneise Lever, MD as Consulting Physician (Pulmonary Disease)  Surgical: She  has a past  surgical history that includes Biopsy thyroid; Varicose vein surgery; Cataract extraction, bilateral; Endometrial biopsy (2000); Colonoscopy w/ biopsies and polypectomy; Tubal ligation; Anterior cervical decomp/discectomy fusion (N/A, 11/08/2014); and Spine surgery (Left,  11/08/2014). Family Her family history includes Cancer in her father and mother. Social history  She reports that she has never smoked. She has never used smokeless tobacco. She reports that she does not drink alcohol or use drugs.  MEDICARE WELLNESS OBJECTIVES: Physical activity: Current Exercise Habits: The patient does not participate in regular exercise at present, Exercise limited by: None identified Cardiac risk factors: Cardiac Risk Factors include: advanced age (>52men, >42 women);obesity (BMI >30kg/m2);sedentary lifestyle;dyslipidemia;hypertension;diabetes mellitus Depression/mood screen:   Depression screen [facility] 2/9 10/07/2019  Decreased Interest 0  Down, Depressed, Hopeless 0  PHQ - 2 Score 0    ADLs:  In your present state of health, do you have any difficulty performing the following activities: 10/07/2019 07/04/2019  Hearing? N N  Vision? N N  Difficulty concentrating or making decisions? Y N  Comment Family assists -  Walking or climbing stairs? N N  Dressing or bathing? N N  Doing errands, shopping? N  N  Comment mostly driven by children, can drive short distances Facilities manager and eating ? N -  Using the Toilet? N -  In the past six months, have you accidently leaked urine? N -  Do you have problems with loss of bowel control? N -  Managing your Medications? N -  Comment family monitors -  Managing your Finances? Y -  Comment family assists -  Housekeeping or managing your Housekeeping? N -  Comment family assists as needed -  Some recent data might be hidden     Cognitive Testing  Alert? Yes  Normal Appearance?Yes  Oriented to person? Yes  Place? Yes   Time? Yes  Recall of three objects?   2/3  Can perform simple calculations? Yes  Displays appropriate judgment?Yes  Can read the correct time from a watch face?Yes  EOL planning: Does Patient Have a Medical Advance Directive?: No Would patient like information on creating a medical advance directive?: No - Patient declined   Objective:   Today's Vitals   10/07/19 1343  BP: (!) 146/72  Pulse: 83  Temp: 97.7 F (36.5 C)  SpO2: 96%  Weight: 211 lb 12.8 oz (96.1 kg)   Body mass index is 36.93 kg/m.  General Appearance: Well nourished, in no apparent distress. Eyes: PERRLA, EOMs, conjunctiva no swelling or erythema Sinuses: No Frontal/maxillary tenderness ENT/Mouth: Ext aud canals clear, TMs without erythema, bulging. No erythema, swelling, or exudate on post pharynx.  Tonsils not swollen or erythematous. Hearing normal.  Neck: Supple, thyroid normal.  Respiratory: Respiratory effort normal, BS equal bilaterally without rales, rhonchi, wheezing or stridor.  Cardio: RRR with no MRGs. Brisk peripheral pulses without edema.  Abdomen: Soft, + BS.  Non tender, no guarding, rebound, hernias, masses. Lymphatics: Non tender without lymphadenopathy.  Musculoskeletal: Full ROM, 5/5 strength, slow steady gait with cane Skin: Warm, dry without rashes, lesions, ecchymosis.  Neuro: Cranial nerves intact. No cerebellar symptoms.  Psych: Awake and oriented X 3, normal affect, Insight and Judgment fair  Medicare Attestation I have personally reviewed: The patient's medical and social history Their use of alcohol, tobacco or illicit drugs Their current medications and supplements The patient's functional ability including ADLs,fall risks, home safety risks, cognitive, and hearing and visual impairment Diet and physical activities Evidence for depression or mood disorders  The patient's weight, height, BMI, and visual acuity have been recorded in the chart.  I have made referrals, counseling, and provided education to the patient  based on review of the above and I have provided the patient with a written personalized care plan for preventive services.     Izora Ribas, NP 2:03 PM [facility]

## 2019-10-07 ENCOUNTER — Other Ambulatory Visit: Payer: Self-pay

## 2019-10-07 ENCOUNTER — Encounter: Payer: Self-pay | Admitting: Adult Health

## 2019-10-07 ENCOUNTER — Ambulatory Visit (INDEPENDENT_AMBULATORY_CARE_PROVIDER_SITE_OTHER): Payer: Medicare Other | Admitting: Adult Health

## 2019-10-07 VITALS — BP 146/72 | HR 83 | Temp 97.7°F | Wt 211.8 lb

## 2019-10-07 DIAGNOSIS — E1169 Type 2 diabetes mellitus with other specified complication: Secondary | ICD-10-CM | POA: Diagnosis not present

## 2019-10-07 DIAGNOSIS — I10 Essential (primary) hypertension: Secondary | ICD-10-CM | POA: Diagnosis not present

## 2019-10-07 DIAGNOSIS — Z0001 Encounter for general adult medical examination with abnormal findings: Secondary | ICD-10-CM

## 2019-10-07 DIAGNOSIS — H3530 Unspecified macular degeneration: Secondary | ICD-10-CM

## 2019-10-07 DIAGNOSIS — I872 Venous insufficiency (chronic) (peripheral): Secondary | ICD-10-CM | POA: Diagnosis not present

## 2019-10-07 DIAGNOSIS — E039 Hypothyroidism, unspecified: Secondary | ICD-10-CM | POA: Diagnosis not present

## 2019-10-07 DIAGNOSIS — M4712 Other spondylosis with myelopathy, cervical region: Secondary | ICD-10-CM

## 2019-10-07 DIAGNOSIS — E669 Obesity, unspecified: Secondary | ICD-10-CM

## 2019-10-07 DIAGNOSIS — E1122 Type 2 diabetes mellitus with diabetic chronic kidney disease: Secondary | ICD-10-CM | POA: Diagnosis not present

## 2019-10-07 DIAGNOSIS — E559 Vitamin D deficiency, unspecified: Secondary | ICD-10-CM

## 2019-10-07 DIAGNOSIS — R6889 Other general symptoms and signs: Secondary | ICD-10-CM

## 2019-10-07 DIAGNOSIS — E1121 Type 2 diabetes mellitus with diabetic nephropathy: Secondary | ICD-10-CM

## 2019-10-07 DIAGNOSIS — E1142 Type 2 diabetes mellitus with diabetic polyneuropathy: Secondary | ICD-10-CM

## 2019-10-07 DIAGNOSIS — N183 Chronic kidney disease, stage 3 unspecified: Secondary | ICD-10-CM

## 2019-10-07 DIAGNOSIS — F0150 Vascular dementia without behavioral disturbance: Secondary | ICD-10-CM

## 2019-10-07 DIAGNOSIS — E2839 Other primary ovarian failure: Secondary | ICD-10-CM

## 2019-10-07 DIAGNOSIS — M5136 Other intervertebral disc degeneration, lumbar region: Secondary | ICD-10-CM

## 2019-10-07 DIAGNOSIS — Z0000 Encounter for general adult medical examination without abnormal findings: Secondary | ICD-10-CM

## 2019-10-07 DIAGNOSIS — E785 Hyperlipidemia, unspecified: Secondary | ICD-10-CM | POA: Diagnosis not present

## 2019-10-07 NOTE — Patient Instructions (Addendum)
Catherine Wood , Thank you for taking time to come for your Medicare Wellness Visit. I appreciate your ongoing commitment to your health goals. Please review the following plan we discussed and let me know if I can assist you in the future.   These are the goals we discussed: Goals    . Blood Pressure < 130/80    . Exercise 150 min/wk Moderate Activity     Aim for 15-20 min once or twice daily, walking, resistance bands, video programs, etc     . fasting glucose     Aim for <150, call if having persistently higher than this, or with any low sugars    . Weight (lb) < 200 lb (90.7 kg)       This is a list of the screening recommended for you and due dates:  Health Maintenance  Topic Date Due  . Hemoglobin A1C  12/30/2019  . Complete foot exam   06/30/2020  . Eye exam for diabetics  07/11/2020  . Tetanus Vaccine  10/20/2024  . Flu Shot  Completed  . DEXA scan (bone density measurement)  Completed  . Pneumonia vaccines  Completed    DEXA /bone density exam was ordered - can call to schedule this at the breast center where you get your mammograms    Memory Compensation Strategies  1. Use "WARM" strategy.  W= write it down  A= associate it  R= repeat it  M= make a mental note  2.   You can keep a Social worker.  Use a 3-ring notebook with sections for the following: calendar, important names and phone numbers,  medications, doctors' names/phone numbers, lists/reminders, and a section to journal what you did  each day.   3.    Use a calendar to write appointments down.  4.    Write yourself a schedule for the day.  This can be placed on the calendar or in a separate section of the Memory Notebook.  Keeping a  regular schedule can help memory.  5.    Use medication organizer with sections for each day or morning/evening pills.  You may need help loading it  6.    Keep a basket, or pegboard by the door.  Place items that you need to take out with you in the basket or on the  pegboard.  You may also want to  include a message board for reminders.  7.    Use sticky notes.  Place sticky notes with reminders in a place where the task is performed.  For example: " turn off the  stove" placed by the stove, "lock the door" placed on the door at eye level, " take your medications" on  the bathroom mirror or by the place where you normally take your medications.  8.    Use alarms/timers.  Use while cooking to remind yourself to check on food or as a reminder to take your medicine, or as a  reminder to make a call, or as a reminder to perform another task, etc.       Vascular Dementia Dementia is a condition in which a person has problems with thinking, memory, and behavior that are severe enough to interfere with daily life. Vascular dementia is a type of dementia. It results from brain damage that is caused by the brain not getting enough blood. This condition may also be called vascular cognitive impairment. What are the causes? Vascular dementia is caused by conditions that lessen blood flow  to the brain. Common causes of this condition include:  Multiple small strokes. These may happen without symptoms (silent stroke).  Major stroke.  Damage to small blood vessels in the brain (cerebral small vessel disease). What increases the risk? The following factors may make you more likely to develop this condition:  Having had a stroke.  Having high blood pressure (hypertension) or high cholesterol.  Having a disease that affects the heart or blood vessels.  Smoking.  Having diabetes.  Having metabolic syndrome.  Being obese.  Not being active.  Having depression.  Being over age 71. What are the signs or symptoms? Symptoms can vary from one person to another. Symptoms may be mild or severe depending on the amount of damage and which parts of the brain have been affected. Symptoms may begin suddenly or may develop slowly. Mental symptoms of vascular  dementia may include:  Confusion.  Memory problems.  Poor attention and concentration.  Trouble understanding speech.  Depression.  Personality changes.  Trouble recognizing familiar people.  Agitation or aggression.  Paranoia.  Delusions or hallucinations. Physical symptoms of vascular dementia may include:  Weakness.  Poor balance.  Loss of bladder or bowel control (incontinence).  Unsteady walking (gait).  Speaking problems. Behavioral symptoms of vascular dementia may include:  Getting lost in familiar places.  Problems with planning and judgment.  Trouble following instructions.  Social problems.  Emotional outbursts.  Trouble with daily activities and self-care.  Problems handling money. Symptoms may remain stable, or they may get worse over time. Symptoms of vascular dementia may be similar to those of Alzheimer's disease. The two conditions can occur together (mixed dementia). How is this diagnosed? Your health care provider will consider your medical history and symptoms or changes that are reported by friends and family. Your health care provider will do a physical exam and may order lab tests or other tests that check brain and nervous system function. Tests that may be done include:  Blood tests.  Brain imaging tests.  Tests of movement, speech, and other daily activities (neurological exam).  Tests of memory, thinking, and problem-solving (neuropsychological or neurocognitive testing). There is not a specific test to diagnose vascular dementia. Diagnosis may involve several specialists. These may include:  A health care provider who specializes in the brain and nervous system (neurologist).  A health provider who specializes in understanding how problems in the brain can alter behavior and cognitive function (neuropsychologist). How is this treated? There is no cure for vascular dementia. Brain damage that has already occurred cannot be  reversed. Treatment depends on:  How severe the condition is.  Which parts of your brain have been affected.  Your overall health. Treatment measures aim to:  Treat the underlying cause of vascular dementia and manage risk factors. This may include: ? Controlling blood pressure. ? Lowering cholesterol. ? Treating diabetes. ? Quitting smoking. ? Losing weight or maintaining a healthy weight. ? Eating a healthy, balanced diet. ? Getting regular exercise.  Manage symptoms.  Prevent further brain damage.  Improve the person's health and quality of life. Treatment for dementia may involve a team of health care providers, including:  A neurologist.  A provider who specializes in disorders of the mind (psychiatrist).  A provider who specializes in helping people learn daily living skills (occupational therapist).  A provider who focuses on speech and language changes (Electrical engineer).  A heart specialist (cardiologist).  A provider who helps people learn how to manage physical changes, such as  movement and walking (exercise physiologist or physical therapist). Follow these instructions at home: Lifestyle  People with vascular dementia may need regular help at home or daily care from a family member or home health care worker. Home care for a person with vascular dementia depends on what caused the condition and how severe the symptoms are. General guidelines for caregivers include:  Help the person with dementia remember people, appointments, and daily activities.  Help the person with dementia manage his or her medicines.  Help family and friends learn about ways to communicate with the person with dementia.  Create a safe living space to reduce the risk of injury or falls.  Find a support group to help caregivers and family cope with the effects of dementia.  General instructions  Help the person take over-the-counter and prescription medicines only as told by the  health care provider.  Follow the health care provider's instructions for treating the condition that caused the dementia.  Make sure the person keeps all follow-up visits as told by the health care provider. This is important. Contact a health care provider if:  A fever develops.  New behavioral problems develop.  Problems with swallowing develop.  Confusion gets worse.  Sleepiness gets worse. Get help right away if:  Loss of consciousness occurs.  There is a sudden loss of speech, balance, or thinking ability.  New numbness or paralysis occurs.  Sudden, severe headache occurs.  Vision is lost or suddenly gets worse in one or both eyes. Summary  Vascular dementia is a type of dementia. It results from brain damage that is caused by the brain not getting enough blood.  Vascular dementia is caused by conditions that lessen blood flow to the brain. Common causes of this condition include stroke and damage to small blood vessels in the brain.  Treatment focuses on treating the underlying cause of vascular dementia and managing any risk factors.  People with vascular dementia may need regular help at home or daily care from a family member or home health care worker.  Contact a health care provider if you or your caregiver notice any new symptoms. This information is not intended to replace advice given to you by your health care provider. Make sure you discuss any questions you have with your health care provider. Document Revised: 05/27/2018 Document Reviewed: 05/28/2018 Elsevier Patient Education  2020 [employer].       Exercising to Stay Healthy To become healthy and stay healthy, it is recommended that you do moderate-intensity and vigorous-intensity exercise. You can tell that you are exercising at a moderate intensity if your heart starts beating faster and you start breathing faster but can still hold a conversation. You can tell that you are exercising at a  vigorous intensity if you are breathing much harder and faster and cannot hold a conversation while exercising. Exercising regularly is important. It has many health benefits, such as:  Improving overall fitness, flexibility, and endurance.  Increasing bone density.  Helping with weight control.  Decreasing body fat.  Increasing muscle strength.  Reducing stress and tension.  Improving overall health. How often should I exercise? Choose an activity that you enjoy, and set realistic goals. Your health care provider can help you make an activity plan that works for you. Exercise regularly as told by your health care provider. This may include:  Doing strength training two times a week, such as: ? Lifting weights. ? Using resistance bands. ? Push-ups. ? Sit-ups. ? Yoga.  Doing a certain intensity of exercise for a given amount of time. Choose from these options: ? A total of 150 minutes of moderate-intensity exercise every week. ? A total of 75 minutes of vigorous-intensity exercise every week. ? A mix of moderate-intensity and vigorous-intensity exercise every week. Children, pregnant women, people who have not exercised regularly, people who are overweight, and older adults may need to talk with a health care provider about what activities are safe to do. If you have a medical condition, be sure to talk with your health care provider before you start a new exercise program. What are some exercise ideas? Moderate-intensity exercise ideas include:  Walking 1 mile (1.6 km) in about 15 minutes.  Biking.  Hiking.  Golfing.  Dancing.  Water aerobics. Vigorous-intensity exercise ideas include:  Walking 4.5 miles (7.2 km) or more in about 1 hour.  Jogging or running 5 miles (8 km) in about 1 hour.  Biking 10 miles (16.1 km) or more in about 1 hour.  Lap swimming.  Roller-skating or in-line skating.  Cross-country skiing.  Vigorous competitive sports, such as  football, basketball, and soccer.  Jumping rope.  Aerobic dancing. What are some everyday activities that can help me to get exercise?  [place] work, such as: ? Pushing a Conservation officer, nature. ? Raking and bagging leaves.  Washing your car.  Pushing a stroller.  Shoveling snow.  Gardening.  Washing windows or floors. How can I be more active in my day-to-day activities?  Use stairs instead of an elevator.  Take a walk during your lunch break.  If you drive, park your car farther away from your work or school.  If you take public transportation, get off one stop early and walk the rest of the way.  Stand up or walk around during all of your indoor phone calls.  Get up, stretch, and walk around every 30 minutes throughout the day.  Enjoy exercise with a friend. Support to continue exercising will help you keep a regular routine of activity. What guidelines can I follow while exercising?  Before you start a new exercise program, talk with your health care provider.  Do not exercise so much that you hurt yourself, feel dizzy, or get very short of breath.  Wear comfortable clothes and wear shoes with good support.  Drink plenty of water while you exercise to prevent dehydration or heat stroke.  Work out until your breathing and your heartbeat get faster. Where to find more information  [place].S. Department of Health and Human Services: [employer]at  Centers for Disease Control and Prevention (CDC): http://www.wolf.info/ Summary  Exercising regularly is important. It will improve your overall fitness, flexibility, and endurance.  Regular exercise also will improve your overall health. It can help you control your weight, reduce stress, and improve your bone density.  Do not exercise so much that you hurt yourself, feel dizzy, or get very short of breath.  Before you start a new exercise program, talk with your health care provider. This information is not intended to replace advice given to  you by your health care provider. Make sure you discuss any questions you have with your health care provider. Document Revised: 08/08/2017 Document Reviewed: 07/17/2017 Elsevier Patient Education  2020 [employer].

## 2019-10-08 ENCOUNTER — Other Ambulatory Visit: Payer: Self-pay | Admitting: Adult Health

## 2019-10-08 LAB — CBC WITH DIFFERENTIAL/PLATELET
Absolute Monocytes: 856 cells/uL (ref 200–950)
Basophils Absolute: 64 cells/uL (ref 0–200)
Basophils Relative: 0.7 %
Eosinophils Absolute: 589 cells/uL — ABNORMAL HIGH (ref 15–500)
Eosinophils Relative: 6.4 %
HCT: 38.2 % (ref 35.0–45.0)
Hemoglobin: 12.1 g/dL (ref 11.7–15.5)
Lymphs Abs: 2125 cells/uL (ref 850–3900)
MCH: 28.5 pg (ref 27.0–33.0)
MCHC: 31.7 g/dL — ABNORMAL LOW (ref 32.0–36.0)
MCV: 89.9 fL (ref 80.0–100.0)
MPV: 11.6 fL (ref 7.5–12.5)
Monocytes Relative: 9.3 %
Neutro Abs: 5566 cells/uL (ref 1500–7800)
Neutrophils Relative %: 60.5 %
Platelets: 373 10*3/uL (ref 140–400)
RBC: 4.25 10*6/uL (ref 3.80–5.10)
RDW: 12.6 % (ref 11.0–15.0)
Total Lymphocyte: 23.1 %
WBC: 9.2 10*3/uL (ref 3.8–10.8)

## 2019-10-08 LAB — LIPID PANEL
Cholesterol: 180 mg/dL (ref <200)
HDL: 46 mg/dL — ABNORMAL LOW (ref >=50)
LDL Cholesterol (Calc): 95 mg/dL (calc)
Non-HDL Cholesterol (Calc): 134 mg/dL (calc) — ABNORMAL HIGH (ref <130)
Total CHOL/HDL Ratio: 3.9 (calc) (ref <5.0)
Triglycerides: 293 mg/dL — ABNORMAL HIGH (ref <150)

## 2019-10-08 LAB — COMPLETE METABOLIC PANEL WITH GFR
AG Ratio: 1.8 (calc) (ref 1.0–2.5)
ALT: 20 U/L (ref 6–29)
AST: 27 U/L (ref 10–35)
Albumin: 4.2 g/dL (ref 3.6–5.1)
Alkaline phosphatase (APISO): 32 U/L — ABNORMAL LOW (ref 37–153)
BUN/Creatinine Ratio: 20 (calc) (ref 6–22)
BUN: 29 mg/dL — ABNORMAL HIGH (ref 7–25)
CO2: 38 mmol/L — ABNORMAL HIGH (ref 20–32)
Calcium: 9.7 mg/dL (ref 8.6–10.4)
Chloride: 95 mmol/L — ABNORMAL LOW (ref 98–110)
Creat: 1.47 mg/dL — ABNORMAL HIGH (ref 0.60–0.88)
GFR, Est African American: 37 mL/min/{1.73_m2} — ABNORMAL LOW (ref >=60)
GFR, Est Non African American: 32 mL/min/{1.73_m2} — ABNORMAL LOW (ref >=60)
Globulin: 2.3 g/dL (calc) (ref 1.9–3.7)
Glucose, Bld: 227 mg/dL — ABNORMAL HIGH (ref 65–99)
Potassium: 5 mmol/L (ref 3.5–5.3)
Sodium: 141 mmol/L (ref 135–146)
Total Bilirubin: 0.3 mg/dL (ref 0.2–1.2)
Total Protein: 6.5 g/dL (ref 6.1–8.1)

## 2019-10-08 LAB — TSH: TSH: 1.97 mIU/L (ref 0.40–4.50)

## 2019-10-08 LAB — HEMOGLOBIN A1C
Hgb A1c MFr Bld: 7.6 % of total Hgb — ABNORMAL HIGH (ref <5.7)
Mean Plasma Glucose: 171 (calc)
eAG (mmol/L): 9.5 (calc)

## 2019-10-08 LAB — MAGNESIUM: Magnesium: 2.1 mg/dL (ref 1.5–2.5)

## 2019-10-08 MED ORDER — FUROSEMIDE 40 MG PO TABS: ORAL_TABLET | ORAL | 0 refills | Status: DC

## 2019-10-08 MED ORDER — GABAPENTIN 300 MG PO CAPS: 300.0000 mg | ORAL_CAPSULE | Freq: Two times a day (BID) | ORAL | 0 refills | Status: DC | PRN

## 2019-10-08 MED ORDER — ROSUVASTATIN CALCIUM 5 MG PO TABS: 5.0000 mg | ORAL_TABLET | Freq: Every day | ORAL | 1 refills | Status: DC

## 2019-10-13 ENCOUNTER — Other Ambulatory Visit: Payer: Self-pay | Admitting: Adult Health

## 2019-10-18 NOTE — Progress Notes (Signed)
Assessment and Plan:  Catherine Wood was seen today for follow-up.  Diagnoses and all orders for this visit:  Deterioration in renal function Unclear etiology; ? Dehydration r/t dementia and poor oral intake PUSH fluids D/C gabapentin Continue to hold metformin Doing well essentially off of lasix, rare use Denies NSAIDs Check labs as per below; follow in office if improving, refer to nephrology if not significantly improved; denies notable urinary sx, no hx of stones -     BASIC METABOLIC PANEL WITH GFR -     Urinalysis, Routine w reflex microscopic  Essential hypertension Continue medications Monitor blood pressure at home; call if consistently over 140/80 Continue DASH diet.   Reminder to go to the ER if any CP, SOB, nausea, dizziness, severe HA, changes vision/speech, left arm numbness and tingling and jaw pain.  Chronic venous insufficiency Elevate extremities; encouraged compression hose; reduce sodium Continue to hold lasix; minimal edema on exam today   Type 2 diabetes mellitus with stage 3b chronic kidney disease, with long-term current use of insulin (HCC) Encouraged to increase insulin as directed by 2-3 units every 3 days persistently above goal  Fasting glucose goal <150 (increase PM insulin) Pre-dinner glucose <180 (increase AM insulin) PUSH water intake Continue to hold metformin pending GFR results Follow up sooner if persistently above goal  -     BASIC METABOLIC PANEL WITH GFR   Further disposition pending results of labs. Discussed med's effects and SE's.   Over 30 minutes of exam, counseling, chart review, and critical decision making was performed.   Future Appointments  Date Time Provider [place]  01/13/2020  2:30 PM [name], MD [facility] None  08/07/2020  2:00 PM [name], MD [facility] None  11/08/2020  2:00 PM Liane Comber, NP [facility] None     ------------------------------------------------------------------------------------------------------------------   HPI 86 y.o.female presents for 2 week close follow up for diabetes and renal function decline. She has known dementia and is accompanied by her son (primary caregiver) with whom we have been communicating closely.   Son eports has been drinking 5-6 x 6 oz bottles of water, some diet dark sodas, this is reportedly consistent with baseline intake.   She was advised to hold/cut back on metformin; per son stopped completely as was out of medication at that time. Has tritrated up on insulin, giving 50-55 units AM, 40-45 units in the PM. Average fasting sugar has ranged 155-200 in AM per son, though admits eating dinner quite late (9pm).  She was advised to hold PRN lasix 40 mg, reports managing well with minimal swelling only.  Gabapentin was reduced 300 mg QID to BID, reports minimal pain, confident can cut out out completely.   Lab Results  Component Value Date   NA 141 10/07/2019   K 5.0 10/07/2019   CL 95 (L) 10/07/2019   CO2 38 (H) 10/07/2019   GLUCOSE 227 (H) 10/07/2019   BUN 29 (H) 10/07/2019   CREATININE 1.47 (H) 10/07/2019   CALCIUM 9.7 10/07/2019   GFRAA 37 (L) 10/07/2019   GFRNONAA 32 (L) 10/07/2019    today their BP is BP: (!) 144/72  She does not workout. She denies chest pain, shortness of breath, dizziness.   She has been working on diet and exercise for T2 diabetes, and denies hypoglycemia , increased appetite, nausea, paresthesia of the feet, polydipsia, polyuria, visual disturbances, vomiting and weight loss. Last A1C in the office was:  Lab Results  Component Value Date   HGBA1C 7.6 (H) 10/07/2019  Past Medical History:  Diagnosis Date  . Arthritis   . GERD (gastroesophageal reflux disease)   . HOH (hard of hearing)   . Hyperlipidemia   . Hypertension   . Hypothyroid   . IDDM (insulin dependent diabetes mellitus)   . Leg  swelling   . Macular degeneration   . Shortness of breath dyspnea    with exertion  . Sleep apnea    does not wear CPAP  . Varicose veins   . Wears glasses      Allergies  Allergen Reactions  . Detrol [Tolterodine]     Current Outpatient Medications on File Prior to Visit  Medication Sig  . ACCU-CHEK SOFTCLIX LANCETS lancets CHECK BLOOD SUGER UP TO 3 TIMES A DAY  . aspirin 81 MG tablet Take 81 mg by mouth daily.  . Cholecalciferol (VITAMIN D3) 5000 UNITS CAPS Take 5,000 Units by mouth daily.   . DULoxetine (CYMBALTA) 30 MG capsule Take 30 mg by mouth daily.  . fenofibrate 160 MG tablet TAKE 1 TABLET DAILY  . furosemide (LASIX) 40 MG tablet Take 1 tablet daily If Needed for Fluid Retention  / Severe Ankle Swelling.  . insulin NPH-regular Human (NOVOLIN 70/30 RELION) (70-30) 100 UNIT/ML injection Inject 50 units into skin 2 x /day before meals or as directed  . Insulin Pen Needle (PEN NEEDLES 31GX5/16") 31G X 8 MM MISC USE 4 TIMES A DAY  . levothyroxine (SYNTHROID) 100 MCG tablet Take 1 tablet daily on an empty stomach with only water for 30 minutes & no Antacid meds, Calcium or Magnesium for 4 hours & avoid Biotin  . losartan (COZAAR) 100 MG tablet TAKE 1 TABLET ONCE A DAY FOR HIGH BLOOD PRESSURE  . Magnesium 400 MG CAPS Take 1 capsule by mouth 3 (three) times daily.  Marland Kitchen MEGARED OMEGA-3 KRILL OIL 500 MG CAPS Take 500 mg by mouth 2 (two) times daily.  . metFORMIN (GLUCOPHAGE) 1000 MG tablet TAKE (1) TABLET TWICE A DAY WITH MEALS (BREAKFAST AND SUPPER)  . Multiple Vitamins-Minerals (ICAPS PO) Take by mouth daily.  Marland Kitchen OVER THE COUNTER MEDICATION as needed. Macular Degeneration refresh eye drops  . pravastatin (PRAVACHOL) 40 MG tablet Take 1 tablet Daily for Cholesterol  . rosuvastatin (CRESTOR) 5 MG tablet Take 1 tablet (5 mg total) by mouth at bedtime.   No current facility-administered medications on file prior to visit.    ROS: all negative except above.   Physical Exam:  BP  (!) 144/72   Pulse 79   Temp (!) 97.5 F (36.4 C)   Wt 211 lb (95.7 kg)   SpO2 97%   BMI 36.79 kg/m   General Appearance: Well nourished, well dressed elderly female in no apparent distress. Eyes: PERRLA, conjunctiva no swelling or erythema ENT/Mouth: Mask in place;  HOH. Neck: Supple Respiratory: Respiratory effort normal, BS equal bilaterally without rales, rhonchi, wheezing or stridor.  Cardio: RRR with no MRGs. Brisk peripheral pulses without edema.  Abdomen: Soft, obese abdomen, + BS.  Non tender, no guarding, rebound, hernias, masses. No CVA tenderness Lymphatics: Non tender without lymphadenopathy.  Musculoskeletal: No obvious deformity, slow steady non-antalgic gait.  Skin: Warm, dry without rashes, lesions, ecchymosis.  Neuro: Normal muscle tone, no cerebellar symptoms. Sensation intact.  Psych: Awake and oriented X 3, normal affect, Insight and Judgment fair.     Izora Ribas, NP 4:10 PM [facility]

## 2019-10-19 ENCOUNTER — Other Ambulatory Visit: Payer: Self-pay

## 2019-10-19 ENCOUNTER — Encounter: Payer: Self-pay | Admitting: Adult Health

## 2019-10-19 ENCOUNTER — Ambulatory Visit (INDEPENDENT_AMBULATORY_CARE_PROVIDER_SITE_OTHER): Payer: Medicare Other | Admitting: Adult Health

## 2019-10-19 VITALS — BP 144/72 | HR 79 | Temp 97.5°F | Wt 211.0 lb

## 2019-10-19 DIAGNOSIS — N1832 Chronic kidney disease, stage 3b: Secondary | ICD-10-CM

## 2019-10-19 DIAGNOSIS — N289 Disorder of kidney and ureter, unspecified: Secondary | ICD-10-CM

## 2019-10-19 DIAGNOSIS — I872 Venous insufficiency (chronic) (peripheral): Secondary | ICD-10-CM

## 2019-10-19 DIAGNOSIS — E1121 Type 2 diabetes mellitus with diabetic nephropathy: Secondary | ICD-10-CM

## 2019-10-19 DIAGNOSIS — Z794 Long term (current) use of insulin: Secondary | ICD-10-CM | POA: Diagnosis not present

## 2019-10-19 DIAGNOSIS — I10 Essential (primary) hypertension: Secondary | ICD-10-CM | POA: Diagnosis not present

## 2019-10-19 NOTE — Patient Instructions (Signed)
Please continue to hold metformin  Please increase insulin by 2-3 units every 3 days if persistently above goal  Ideally want fasting glucose <150 (increase PM insulin if persistently above goal)  PUSH water intake - ideally 65 fluid ounces of water or other sugar free clear liquids (coffee and dark soda, juice with sugar doesn't count)  Acute Kidney Injury, Adult  Acute kidney injury is a sudden worsening of kidney function. The kidneys are organs that have several jobs. They filter the blood to remove waste products and extra fluid. They also maintain a healthy balance of minerals and hormones in the body, which helps control blood pressure and keep bones strong. With this condition, your kidneys do not do their jobs as well as they should. This condition ranges from mild to severe. Over time it may develop into long-lasting (chronic) kidney disease. Early detection and treatment may prevent acute kidney injury from developing into a chronic condition. What are the causes? Common causes of this condition include:  A problem with blood flow to the kidneys. This may be caused by: ? Low blood pressure (hypotension) or shock. ? Blood loss. ? Heart and blood vessel (cardiovascular) disease. ? Severe burns. ? Liver disease.  Direct damage to the kidneys. This may be caused by: ? Certain medicines. ? A kidney infection. ? Poisoning. ? Being around or in contact with toxic substances. ? A surgical wound. ? A hard, direct hit to the kidney area.  A sudden blockage of urine flow. This may be caused by: ? Cancer. ? Kidney stones. ? An enlarged prostate in males. What are the signs or symptoms? Symptoms of this condition may not be obvious until the condition becomes severe. Symptoms of this condition can include:  Tiredness (lethargy), or difficulty staying awake.  Nausea or vomiting.  Swelling (edema) of the face, legs, ankles, or feet.  Problems with urination, such as: ?  Abdominal pain, or pain along the side of your stomach (flank). ? Decreased urine production. ? Decrease in the force of urine flow.  Muscle twitches and cramps, especially in the legs.  Confusion or trouble concentrating.  Loss of appetite.  Fever. How is this diagnosed? This condition may be diagnosed with tests, including:  Blood tests.  Urine tests.  Imaging tests.  A test in which a sample of tissue is removed from the kidneys to be examined under a microscope (kidney biopsy). How is this treated? Treatment for this condition depends on the cause and how severe the condition is. In mild cases, treatment may not be needed. The kidneys may heal on their own. In more severe cases, treatment will involve:  Treating the cause of the kidney injury. This may involve changing any medicines you are taking or adjusting your dosage.  Fluids. You may need specialized IV fluids to balance your body's needs.  Having a catheter placed to drain urine and prevent blockages.  Preventing problems from occurring. This may mean avoiding certain medicines or procedures that can cause further injury to the kidneys. In some cases treatment may also require:  A procedure to remove toxic wastes from the body (dialysis or continuous renal replacement therapy - CRRT).  Surgery. This may be done to repair a torn kidney, or to remove the blockage from the urinary system. Follow these instructions at home: Medicines  Take over-the-counter and prescription medicines only as told by your health care provider.  Do not take any new medicines without your health care provider's approval. Many medicines  can worsen your kidney damage.  Do not take any vitamin and mineral supplements without your health care provider's approval. Many nutritional supplements can worsen your kidney damage. Lifestyle  If your health care provider prescribed changes to your diet, follow them. You may need to decrease the  amount of protein you eat.  Achieve and maintain a healthy weight. If you need help with this, ask your health care provider.  Start or continue an exercise plan. Try to exercise at least 30 minutes a day, 5 days a week.  Do not use any tobacco products, such as cigarettes, chewing tobacco, and e-cigarettes. If you need help quitting, ask your health care provider. General instructions  Keep track of your blood pressure. Report changes in your blood pressure as told by your health care provider.  Stay up to date with immunizations. Ask your health care provider which immunizations you need.  Keep all follow-up visits as told by your health care provider. This is important. Where to find more information  American Association of Kidney Patients: BombTimer.gl  National Kidney Foundation: www.kidney.[place]: https://mathis.com/  Life [facility]: ? www.lifeoptions.org ? www.kidneyschool.org Contact a health care provider if:  Your symptoms get worse.  You develop new symptoms. Get help right away if:  You develop symptoms of worsening kidney disease, which include: ? Headaches. ? Abnormally dark or light skin. ? Easy bruising. ? Frequent hiccups. ? Chest pain. ? Shortness of breath. ? End of menstruation in women. ? Seizures. ? Confusion or altered mental status. ? Abdominal or back pain. ? Itchiness.  You have a fever.  Your body is producing less urine.  You have pain or bleeding when you urinate. Summary  Acute kidney injury is a sudden worsening of kidney function.  Acute kidney injury can be caused by problems with blood flow to the kidneys, direct damage to the kidneys, and sudden blockage of urine flow.  Symptoms of this condition may not be obvious until it becomes severe. Symptoms may include edema, lethargy, confusion, nausea or vomiting, and problems passing urine.  This condition can usually be diagnosed with blood  tests, urine tests, and imaging tests. Sometimes a kidney biopsy is done to diagnose this condition.  Treatment for this condition often involves treating the underlying cause. It is treated with fluids, medicines, dialysis, diet changes, or surgery. This information is not intended to replace advice given to you by your health care provider. Make sure you discuss any questions you have with your health care provider. Document Revised: 08/08/2017 Document Reviewed: 08/16/2016 Elsevier Patient Education  2020 [employer].

## 2019-10-20 ENCOUNTER — Other Ambulatory Visit: Payer: Self-pay | Admitting: Adult Health

## 2019-10-20 DIAGNOSIS — N183 Chronic kidney disease, stage 3 unspecified: Secondary | ICD-10-CM

## 2019-10-20 DIAGNOSIS — N289 Disorder of kidney and ureter, unspecified: Secondary | ICD-10-CM

## 2019-10-20 DIAGNOSIS — E1122 Type 2 diabetes mellitus with diabetic chronic kidney disease: Secondary | ICD-10-CM

## 2019-10-20 LAB — URINALYSIS, ROUTINE W REFLEX MICROSCOPIC
Bacteria, UA: NONE SEEN /HPF
Bilirubin Urine: NEGATIVE
Glucose, UA: NEGATIVE
Hgb urine dipstick: NEGATIVE
Hyaline Cast: NONE SEEN /LPF
Ketones, ur: NEGATIVE
Nitrite: NEGATIVE
Specific Gravity, Urine: 1.015 (ref 1.001–1.03)
Squamous Epithelial / HPF: NONE SEEN /HPF (ref <=5)
WBC, UA: NONE SEEN /HPF (ref 0–5)
pH: 8 (ref 5.0–8.0)

## 2019-10-20 LAB — BASIC METABOLIC PANEL WITH GFR
BUN/Creatinine Ratio: 17 (calc) (ref 6–22)
BUN: 18 mg/dL (ref 7–25)
CO2: 31 mmol/L (ref 20–32)
Calcium: 10.2 mg/dL (ref 8.6–10.4)
Chloride: 100 mmol/L (ref 98–110)
Creat: 1.04 mg/dL — ABNORMAL HIGH (ref 0.60–0.88)
GFR, Est African American: 56 mL/min/{1.73_m2} — ABNORMAL LOW (ref >=60)
GFR, Est Non African American: 48 mL/min/{1.73_m2} — ABNORMAL LOW (ref >=60)
Glucose, Bld: 135 mg/dL — ABNORMAL HIGH (ref 65–99)
Potassium: 4.7 mmol/L (ref 3.5–5.3)
Sodium: 139 mmol/L (ref 135–146)

## 2019-11-02 ENCOUNTER — Other Ambulatory Visit: Payer: Self-pay | Admitting: Internal Medicine

## 2019-11-02 ENCOUNTER — Other Ambulatory Visit: Payer: Self-pay | Admitting: Adult Health

## 2019-11-11 ENCOUNTER — Ambulatory Visit (INDEPENDENT_AMBULATORY_CARE_PROVIDER_SITE_OTHER): Payer: Medicare Other | Admitting: *Deleted

## 2019-11-11 ENCOUNTER — Other Ambulatory Visit: Payer: Self-pay

## 2019-11-11 ENCOUNTER — Other Ambulatory Visit: Payer: Self-pay | Admitting: Internal Medicine

## 2019-11-11 DIAGNOSIS — I10 Essential (primary) hypertension: Secondary | ICD-10-CM

## 2019-11-11 DIAGNOSIS — N289 Disorder of kidney and ureter, unspecified: Secondary | ICD-10-CM

## 2019-11-11 DIAGNOSIS — N183 Chronic kidney disease, stage 3 unspecified: Secondary | ICD-10-CM | POA: Diagnosis not present

## 2019-11-11 DIAGNOSIS — E1122 Type 2 diabetes mellitus with diabetic chronic kidney disease: Secondary | ICD-10-CM

## 2019-11-11 DIAGNOSIS — Z79899 Other long term (current) drug therapy: Secondary | ICD-10-CM

## 2019-11-11 LAB — BASIC METABOLIC PANEL WITH GFR
BUN/Creatinine Ratio: 21 (calc) (ref 6–22)
BUN: 19 mg/dL (ref 7–25)
CO2: 31 mmol/L (ref 20–32)
Calcium: 9.8 mg/dL (ref 8.6–10.4)
Chloride: 99 mmol/L (ref 98–110)
Creat: 0.9 mg/dL — ABNORMAL HIGH (ref 0.60–0.88)
GFR, Est African American: 67 mL/min/{1.73_m2} (ref >=60)
GFR, Est Non African American: 57 mL/min/{1.73_m2} — ABNORMAL LOW (ref >=60)
Glucose, Bld: 250 mg/dL — ABNORMAL HIGH (ref 65–99)
Potassium: 4.9 mmol/L (ref 3.5–5.3)
Sodium: 137 mmol/L (ref 135–146)

## 2019-11-11 NOTE — Progress Notes (Signed)
Patient is here for a 3 week NV to recheck a BMP.  She states she drinks 3 to 4 medium glasses of water daily plus coffee. She is not taking Metformin or Lasix.  She avoids NSAIDS. She did not bring a list of her blood sugars but states they are OK.

## 2019-11-17 ENCOUNTER — Other Ambulatory Visit: Payer: Self-pay | Admitting: Adult Health

## 2019-11-29 ENCOUNTER — Encounter: Payer: Self-pay | Admitting: Ophthalmology

## 2019-11-29 DIAGNOSIS — H3562 Retinal hemorrhage, left eye: Secondary | ICD-10-CM | POA: Diagnosis not present

## 2019-11-29 DIAGNOSIS — H04123 Dry eye syndrome of bilateral lacrimal glands: Secondary | ICD-10-CM | POA: Diagnosis not present

## 2019-11-29 DIAGNOSIS — H353221 Exudative age-related macular degeneration, left eye, with active choroidal neovascularization: Secondary | ICD-10-CM | POA: Diagnosis not present

## 2019-11-29 DIAGNOSIS — H35341 Macular cyst, hole, or pseudohole, right eye: Secondary | ICD-10-CM | POA: Diagnosis not present

## 2019-11-29 LAB — HM DIABETES EYE EXAM

## 2019-12-14 DIAGNOSIS — B351 Tinea unguium: Secondary | ICD-10-CM | POA: Diagnosis not present

## 2019-12-14 DIAGNOSIS — M79676 Pain in unspecified toe(s): Secondary | ICD-10-CM | POA: Diagnosis not present

## 2019-12-14 DIAGNOSIS — E1151 Type 2 diabetes mellitus with diabetic peripheral angiopathy without gangrene: Secondary | ICD-10-CM | POA: Diagnosis not present

## 2019-12-14 DIAGNOSIS — L84 Corns and callosities: Secondary | ICD-10-CM | POA: Diagnosis not present

## 2019-12-31 DIAGNOSIS — L905 Scar conditions and fibrosis of skin: Secondary | ICD-10-CM | POA: Diagnosis not present

## 2019-12-31 DIAGNOSIS — L814 Other melanin hyperpigmentation: Secondary | ICD-10-CM | POA: Diagnosis not present

## 2019-12-31 DIAGNOSIS — Z85828 Personal history of other malignant neoplasm of skin: Secondary | ICD-10-CM | POA: Diagnosis not present

## 2019-12-31 DIAGNOSIS — L821 Other seborrheic keratosis: Secondary | ICD-10-CM | POA: Diagnosis not present

## 2019-12-31 DIAGNOSIS — L570 Actinic keratosis: Secondary | ICD-10-CM | POA: Diagnosis not present

## 2020-01-10 ENCOUNTER — Ambulatory Visit (INDEPENDENT_AMBULATORY_CARE_PROVIDER_SITE_OTHER): Payer: Medicare Other | Admitting: Ophthalmology

## 2020-01-10 ENCOUNTER — Encounter (INDEPENDENT_AMBULATORY_CARE_PROVIDER_SITE_OTHER): Payer: Self-pay | Admitting: Ophthalmology

## 2020-01-10 ENCOUNTER — Other Ambulatory Visit: Payer: Self-pay

## 2020-01-10 DIAGNOSIS — H35341 Macular cyst, hole, or pseudohole, right eye: Secondary | ICD-10-CM | POA: Diagnosis not present

## 2020-01-10 DIAGNOSIS — H353134 Nonexudative age-related macular degeneration, bilateral, advanced atrophic with subfoveal involvement: Secondary | ICD-10-CM | POA: Insufficient documentation

## 2020-01-10 DIAGNOSIS — H353221 Exudative age-related macular degeneration, left eye, with active choroidal neovascularization: Secondary | ICD-10-CM | POA: Diagnosis not present

## 2020-01-10 DIAGNOSIS — H43812 Vitreous degeneration, left eye: Secondary | ICD-10-CM

## 2020-01-10 DIAGNOSIS — H04123 Dry eye syndrome of bilateral lacrimal glands: Secondary | ICD-10-CM | POA: Insufficient documentation

## 2020-01-10 DIAGNOSIS — H3562 Retinal hemorrhage, left eye: Secondary | ICD-10-CM

## 2020-01-10 HISTORY — DX: Nonexudative age-related macular degeneration, bilateral, advanced atrophic with subfoveal involvement: H35.3134

## 2020-01-10 MED ORDER — BEVACIZUMAB CHEMO INJECTION 1.25MG/0.05ML SYRINGE FOR KALEIDOSCOPE
1.2500 mg | INTRAVITREAL | Status: AC | PRN
  Administered 2020-01-10: 1.25 mg via INTRAVITREAL

## 2020-01-10 NOTE — Assessment & Plan Note (Addendum)
The nature of wet macular degeneration was discussed with the patient.  Forms of therapy reviewed include the use of Anti-VEGF medications injected painlessly into the eye, as well as other possible treatment modalities, including thermal laser therapy. Fellow eye involvement and risks were discussed with the patient. Upon the finding of wet age related macular degeneration, treatment will be offered. The treatment regimen is on a treat as needed basis with the intent to treat if necessary and extend interval of exams when possible. On average 1 out of 6 patients do not need lifetime therapy. However, the risk of recurrent disease is high for a lifetime.  Initially monthly, then periodic, examinations and evaluations will determine whether the next treatment is required on the day of the examination.  The left eye has a history of chronic recurrent and ongoing CNVM activity, rap-like type lesion temporal to the foveal avascular zone.  This is controlled with stable acuity on 5-week examination and Avastin as needed

## 2020-01-10 NOTE — Progress Notes (Signed)
01/10/2020     CHIEF COMPLAINT Patient presents for Retina Follow Up   HISTORY OF PRESENT ILLNESS: Catherine Wood is a 86 y.o. female who presents to the clinic today for:   HPI    Retina Follow Up    Patient presents with  Wet AMD.  In left eye.  Severity is moderate.  Duration of 6 weeks.  Since onset it is gradually improving.  I, the attending physician,  performed the HPI with the patient and updated documentation appropriately.          Comments    6 Week AMD f\u OS. Possible Avastin OS. OCT  Pt states vision is doing well. Denies FOL and floaters.  BGL: did not check       Last edited by Tilda Franco on 01/10/2020  1:22 PM. (History)      Referring physician: [name], MD [address] [place],  [place] [address]  HISTORICAL INFORMATION:   Selected notes from the MEDICAL RECORD NUMBER    Lab Results  Component Value Date   HGBA1C 7.6 (H) 10/07/2019     CURRENT MEDICATIONS: No current outpatient medications on file. (Ophthalmic Drugs)   No current facility-administered medications for this visit. (Ophthalmic Drugs)   Current Outpatient Medications (Other)  Medication Sig  . ACCU-CHEK SOFTCLIX LANCETS lancets CHECK BLOOD SUGER UP TO 3 TIMES A DAY  . aspirin 81 MG tablet Take 81 mg by mouth daily.  . Cholecalciferol (VITAMIN D3) 5000 UNITS CAPS Take 5,000 Units by mouth daily.   . DULoxetine (CYMBALTA) 30 MG capsule Take 30 mg by mouth daily.  . fenofibrate 160 MG tablet Take 1 tablet Daily for Triglycerides (Blood Fats)  . furosemide (LASIX) 40 MG tablet Take 1 to 2 tablets daily as needed for Fluid Retention / Ankle Swelling  . insulin NPH-regular Human (NOVOLIN 70/30 RELION) (70-30) 100 UNIT/ML injection Inject 50 units into skin 2 x /day before meals or as directed  . Insulin Pen Needle (PEN NEEDLES 31GX5/16") 31G X 8 MM MISC USE 4 TIMES A DAY  . levothyroxine (SYNTHROID) 100 MCG tablet Take 1 tablet daily on an empty stomach  with only water for 30 minutes & no Antacid meds, Calcium or Magnesium for 4 hours & avoid Biotin  . losartan (COZAAR) 100 MG tablet TAKE 1 TABLET ONCE A DAY FOR HIGH BLOOD PRESSURE  . Magnesium 400 MG CAPS Take 1 capsule by mouth 3 (three) times daily.  Marland Kitchen MEGARED OMEGA-3 KRILL OIL 500 MG CAPS Take 500 mg by mouth 2 (two) times daily.  . metFORMIN (GLUCOPHAGE) 1000 MG tablet TAKE (1) TABLET TWICE A DAY WITH MEALS (BREAKFAST AND SUPPER)  . Multiple Vitamins-Minerals (ICAPS PO) Take by mouth daily.  Marland Kitchen OVER THE COUNTER MEDICATION as needed. Macular Degeneration refresh eye drops  . pravastatin (PRAVACHOL) 40 MG tablet Take 1 tablet Daily for Cholesterol  . rosuvastatin (CRESTOR) 5 MG tablet Take 1 tablet (5 mg total) by mouth at bedtime.   No current facility-administered medications for this visit. (Other)      REVIEW OF SYSTEMS: ROS    Positive for: Endocrine   Last edited by Tilda Franco on 01/10/2020  1:22 PM. (History)       ALLERGIES Allergies  Allergen Reactions  . Detrol [Tolterodine]     PAST MEDICAL HISTORY Past Medical History:  Diagnosis Date  . Arthritis   . GERD (gastroesophageal reflux disease)   . HOH (hard of hearing)   .  Hyperlipidemia   . Hypertension   . Hypothyroid   . IDDM (insulin dependent diabetes mellitus)   . Leg swelling   . Macular degeneration   . Shortness of breath dyspnea    with exertion  . Sleep apnea    does not wear CPAP  . Varicose veins   . Wears glasses    Past Surgical History:  Procedure Laterality Date  . ANTERIOR CERVICAL DECOMP/DISCECTOMY FUSION N/A 11/08/2014   Procedure: Cervical three-four,  Cervical six-seven anterior cervical decompression with fusion plating and bonegraft;  Surgeon: Ashok Pall, MD;  Location: [facility];  Service: Neurosurgery;  Laterality: N/A;  Cervical three-four,  Cervical six-seven anterior cervical decompression with fusion plating and bonegraft  . BIOPSY THYROID     benign thyroid nodule  removal  . CATARACT EXTRACTION, BILATERAL    . COLONOSCOPY W/ BIOPSIES AND POLYPECTOMY    . ENDOMETRIAL BIOPSY  2000  . SPINE SURGERY Left  11/08/2014   Left ACDF   . TUBAL LIGATION    . VARICOSE VEIN SURGERY      FAMILY HISTORY Family History  Problem Relation Age of Onset  . Cancer Mother        colon  . Cancer Father        GI  . Breast cancer Neg Hx     SOCIAL HISTORY Social History   Tobacco Use  . Smoking status: Never Smoker  . Smokeless tobacco: Never Used  Substance Use Topics  . Alcohol use: No  . Drug use: No         OPHTHALMIC EXAM:  Base Eye Exam    Visual Acuity (Snellen - Linear)      Right Left   Dist cc CF @ 2' 20/40 -2   Dist ph cc  NI   Correction: Glasses       Tonometry (Tonopen, 1:28 PM)      Right Left   Pressure 12 15       Pupils      Pupils Dark Light Shape React APD   Right PERRL 3 3 Round Minimal None   Left PERRL 3 3 Round Minimal None       Visual Fields (Counting fingers)      Left Right    Full Full       Neuro/Psych    Oriented x3: Yes   Mood/Affect: Normal       Dilation    Left eye: 1.0% Mydriacyl, 2.5% Phenylephrine @ 1:28 PM        Slit Lamp and Fundus Exam    External Exam      Right Left   External Normal Normal       Slit Lamp Exam      Right Left   Lids/Lashes Normal Normal   Conjunctiva/Sclera White and quiet White and quiet   Cornea Clear Clear   Anterior Chamber Deep and quiet Deep and quiet   Iris Round and reactive Round and reactive   Lens Posterior chamber intraocular lens    Anterior Vitreous Normal Normal       Fundus Exam      Right Left   Posterior Vitreous  Posterior vitreous detachment   Disc  Normal   C/D Ratio  0.3   Macula  Retinal pigment epithelial atrophy, Geographic atrophy, Advanced age related macular degeneration, Cystoid macular edema, Subretinal neovascular membrane, Microaneurysms   Vessels  Normal   Periphery  Normal  IMAGING AND PROCEDURES    Imaging and Procedures for 01/10/20  OCT, Retina - OU - Both Eyes       Right Eye Quality was good. Scan locations included subfoveal. Progression has been stable. Findings include abnormal foveal contour, retinal drusen , cystoid macular edema, central retinal atrophy, outer retinal atrophy, subretinal scarring.   Left Eye Quality was good. Scan locations included subfoveal. Progression has been stable. Findings include abnormal foveal contour, subretinal scarring, central retinal atrophy, outer retinal atrophy, cystoid macular edema, intraretinal fluid.   Notes OD with advanced atrophic ARMD  OS, atrophic ARMD with area temporal to the foveal avascular zone with overlying intraretinal fluid and CME.  Still active at 5-week interval.  Will return visit in 5-week exam left eye       Intravitreal Injection, Pharmacologic Agent - OS - Left Eye       Time Out 01/10/2020. 2:03 PM. Confirmed correct patient, procedure, site, and patient consented.   Anesthesia Topical anesthesia was used. Anesthetic medications included Akten 3.5%.   Procedure Preparation included Tobramycin 0.3%, Ofloxacin , 10% betadine to eyelids.   Injection:  1.25 mg Bevacizumab (AVASTIN) SOLN   NDC: [id number], [id number]   Route: Intravitreal, Site: Left Eye, Waste: 0 mg  Post-op Post injection exam found visual acuity of at least counting fingers. The patient tolerated the procedure well. There were no complications. The patient received written and verbal post procedure care education. Post injection medications were not given.                 ASSESSMENT/PLAN:  Exudative age-related macular degeneration of left eye with active choroidal neovascularization (HCC) The nature of wet macular degeneration was discussed with the patient.  Forms of therapy reviewed include the use of Anti-VEGF medications injected painlessly into the eye, as well as other possible treatment modalities, including  thermal laser therapy. Fellow eye involvement and risks were discussed with the patient. Upon the finding of wet age related macular degeneration, treatment will be offered. The treatment regimen is on a treat as needed basis with the intent to treat if necessary and extend interval of exams when possible. On average 1 out of 6 patients do not need lifetime therapy. However, the risk of recurrent disease is high for a lifetime.  Initially monthly, then periodic, examinations and evaluations will determine whether the next treatment is required on the day of the examination.  The left eye has a history of chronic recurrent and ongoing CNVM activity, wrap-like type lesion temporal to the foveal avascular zone.  This is controlled with stable acuity on 5-week examination and Avastin as needed      ICD-10-CM   1. Exudative age-related macular degeneration of left eye with active choroidal neovascularization (HCC)  H35.3221 OCT, Retina - OU - Both Eyes    Intravitreal Injection, Pharmacologic Agent - OS - Left Eye    Bevacizumab (AVASTIN) SOLN 1.25 mg  2. Retinal hemorrhage of left eye  H35.62 OCT, Retina - OU - Both Eyes  3. Lamellar macular hole of right eye  H35.341   4. Dry eyes  H04.123   5. Advanced nonexudative age-related macular degeneration of both eyes with subfoveal involvement  H35.3134 OCT, Retina - OU - Both Eyes  6. Posterior vitreous detachment of left eye  H43.812     1.  Repeat intravitreal Avastin OS today.  2.  3.  Ophthalmic Meds Ordered this visit:  Meds ordered this encounter  Medications  . Bevacizumab (AVASTIN)  SOLN 1.25 mg       No follow-ups on file.  There are no Patient Instructions on file for this visit.   Explained the diagnoses, plan, and follow up with the patient and they expressed understanding.  Patient expressed understanding of the importance of proper follow up care.   Clent Demark Eugene Zeiders M.D. Diseases & Surgery of the Retina and Vitreous [facility]  [facility] [place] 01/10/20     Abbreviations: M myopia (nearsighted); A astigmatism; H hyperopia (farsighted); P presbyopia; Mrx spectacle prescription;  CTL contact lenses; OD right eye; OS left eye; OU both eyes  XT exotropia; ET esotropia; PEK punctate epithelial keratitis; PEE punctate epithelial erosions; DES dry eye syndrome; MGD meibomian gland dysfunction; ATs artificial tears; PFAT's preservative free artificial tears; [place] nuclear sclerotic cataract; PSC posterior subcapsular cataract; ERM epi-retinal membrane; PVD posterior vitreous detachment; RD retinal detachment; DM diabetes mellitus; DR diabetic retinopathy; NPDR non-proliferative diabetic retinopathy; PDR proliferative diabetic retinopathy; CSME clinically significant macular edema; DME diabetic macular edema; dbh dot blot hemorrhages; CWS cotton wool spot; POAG primary open angle glaucoma; C/D cup-to-disc ratio; HVF humphrey visual field; GVF goldmann visual field; OCT optical coherence tomography; IOP intraocular pressure; BRVO Branch retinal vein occlusion; CRVO central retinal vein occlusion; CRAO central retinal artery occlusion; BRAO branch retinal artery occlusion; RT retinal tear; SB scleral buckle; PPV pars plana vitrectomy; VH Vitreous hemorrhage; PRP panretinal laser photocoagulation; IVK intravitreal kenalog; VMT vitreomacular traction; MH Macular hole;  NVD neovascularization of the disc; NVE neovascularization elsewhere; AREDS age related eye disease study; ARMD age related macular degeneration; POAG primary open angle glaucoma; EBMD epithelial/anterior basement membrane dystrophy; ACIOL anterior chamber intraocular lens; IOL intraocular lens; PCIOL posterior chamber intraocular lens; Phaco/IOL phacoemulsification with intraocular lens placement; [place] photorefractive keratectomy; LASIK laser assisted in situ keratomileusis; HTN hypertension; DM diabetes mellitus; COPD chronic obstructive pulmonary disease

## 2020-01-12 ENCOUNTER — Encounter: Payer: Self-pay | Admitting: Internal Medicine

## 2020-01-12 NOTE — Progress Notes (Signed)
History of Present Illness:      This very nice 86 y.o. WWF presents for 6 month follow up with HTN, HLD, Insulin Requiring T2_DM and Vitamin D Deficiency.       Patient is treated for HTN (1994) & BP has been controlled at home. Today's BP: 128/74. Patient has had no complaints of any cardiac type chest pain, palpitations, dyspnea / orthopnea / PND, dizziness, claudication, or dependent edema.      Hyperlipidemia is controlled with diet & meds. Patient denies myalgias or other med SE's. Last Lipids were at goal except elevated Trig's:  Lab Results  Component Value Date   CHOL 180 10/07/2019   HDL 46 (L) 10/07/2019   LDLCALC 95 10/07/2019   TRIG 293 (H) 10/07/2019   CHOLHDL 3.9 10/07/2019    Also, the patient has history of Insulin Requiring  T2_DM (2003) w/CKD 3a (GFR 57).  Patient has peripheral sensory Neuropathy and is followed closely by Dr Zadie Rhine for diabetic retinopathy with monthly eye injections. She denies  symptoms of reactive hypoglycemia, diabetic polys, paresthesias or visual blurring.  Last A1c was not  at goal:  Lab Results  Component Value Date   HGBA1C 7.6 (H) 10/07/2019            Patient has been on Thyroid Replacement since Goiter  surgery in the 1970'.  Further, the patient also has history of Vitamin D Deficiency and does not  supplement vitamin D as advised. Last vitamin D was very low:  Lab Results  Component Value Date   VD25OH 27 (L) 07/01/2019    Current Outpatient Medications on File Prior to Visit  Medication Sig  . ACCU-CHEK SOFTCLIX LANCETS lancets CHECK BLOOD SUGER UP TO 3 TIMES A DAY  . aspirin 81 MG tablet Take 81 mg by mouth daily.  . Cholecalciferol (VITAMIN D3) 5000 UNITS CAPS Take 5,000 Units by mouth daily.   . DULoxetine (CYMBALTA) 30 MG capsule Take 30 mg by mouth daily.  . fenofibrate 160 MG tablet Take 1 tablet Daily for Triglycerides (Blood Fats)  . furosemide (LASIX) 40 MG tablet Take 1 to 2 tablets daily as needed for Fluid  Retention / Ankle Swelling  . insulin NPH-regular Human (NOVOLIN 70/30 RELION) (70-30) 100 UNIT/ML injection Inject 50 units into skin 2 x /day before meals or as directed  . Insulin Pen Needle (PEN NEEDLES 31GX5/16") 31G X 8 MM MISC USE 4 TIMES A DAY  . levothyroxine (SYNTHROID) 100 MCG tablet Take 1 tablet daily on an empty stomach with only water for 30 minutes & no Antacid meds, Calcium or Magnesium for 4 hours & avoid Biotin  . losartan (COZAAR) 100 MG tablet TAKE 1 TABLET ONCE A DAY FOR HIGH BLOOD PRESSURE  . Magnesium 400 MG CAPS Take 1 capsule by mouth 3 (three) times daily.  Marland Kitchen MEGARED OMEGA-3 KRILL OIL 500 MG CAPS Take 500 mg by mouth 2 (two) times daily.  . metFORMIN (GLUCOPHAGE) 1000 MG tablet TAKE (1) TABLET TWICE A DAY WITH MEALS (BREAKFAST AND SUPPER)  . Multiple Vitamins-Minerals (ICAPS PO) Take by mouth daily.  Marland Kitchen OVER THE COUNTER MEDICATION as needed. Macular Degeneration refresh eye drops  . rosuvastatin (CRESTOR) 5 MG tablet Take 1 tablet (5 mg total) by mouth at bedtime.   No current facility-administered medications on file prior to visit.    Allergies  Allergen Reactions  . Detrol [Tolterodine]     PMHx:   Past Medical History:  Diagnosis  Date  . Arthritis   . GERD (gastroesophageal reflux disease)   . HOH (hard of hearing)   . Hyperlipidemia   . Hypertension   . Hypothyroid   . IDDM (insulin dependent diabetes mellitus)   . Leg swelling   . Macular degeneration   . Shortness of breath dyspnea    with exertion  . Sleep apnea    does not wear CPAP  . Varicose veins   . Wears glasses     Immunization History  Administered Date(s) Administered  . DT (Pediatric) 10/20/2014  . Influenza Split 06/04/2017  . Influenza, High Dose Seasonal PF 07/01/2016, 05/29/2018  . Influenza-Unspecified 06/22/2013, 06/16/2014, 06/27/2014, 07/06/2015, 06/09/2019  . Pneumococcal Conjugate-13 04/05/2014  . Pneumococcal-Unspecified 09/09/2004  . Td 09/10/2003  . Zoster  09/09/2005    Past Surgical History:  Procedure Laterality Date  . ANTERIOR CERVICAL DECOMP/DISCECTOMY FUSION N/A 11/08/2014   Procedure: Cervical three-four,  Cervical six-seven anterior cervical decompression with fusion plating and bonegraft;  Surgeon: Ashok Pall, MD;  Location: [facility];  Service: Neurosurgery;  Laterality: N/A;  Cervical three-four,  Cervical six-seven anterior cervical decompression with fusion plating and bonegraft  . BIOPSY THYROID     benign thyroid nodule removal  . CATARACT EXTRACTION, BILATERAL    . COLONOSCOPY W/ BIOPSIES AND POLYPECTOMY    . ENDOMETRIAL BIOPSY  2000  . SPINE SURGERY Left  11/08/2014   Left ACDF   . TUBAL LIGATION    . VARICOSE VEIN SURGERY      FHx:    Reviewed / unchanged  SHx:    Reviewed / unchanged   Systems Review:  Constitutional: Denies fever, chills, wt changes, headaches, insomnia, fatigue, night sweats, change in appetite. Eyes: Denies redness, blurred vision, diplopia, discharge, itchy, watery eyes.  ENT: Denies discharge, congestion, post nasal drip, epistaxis, sore throat, earache, hearing loss, dental pain, tinnitus, vertigo, sinus pain, snoring.  CV: Denies chest pain, palpitations, irregular heartbeat, syncope, dyspnea, diaphoresis, orthopnea, PND, claudication or edema. Respiratory: denies cough, dyspnea, DOE, pleurisy, hoarseness, laryngitis, wheezing.  Gastrointestinal: Denies dysphagia, odynophagia, heartburn, reflux, water brash, abdominal pain or cramps, nausea, vomiting, bloating, diarrhea, constipation, hematemesis, melena, hematochezia  or hemorrhoids. Genitourinary: Denies dysuria, frequency, urgency, nocturia, hesitancy, discharge, hematuria or flank pain. Musculoskeletal: Denies arthralgias, myalgias, stiffness, jt. swelling, pain, limping or strain/sprain.  Skin: Denies pruritus, rash, hives, warts, acne, eczema or change in skin lesion(s). Neuro: No weakness, tremor, incoordination, spasms, paresthesia  or pain. Psychiatric: Denies confusion, memory loss or sensory loss. Endo: Denies change in weight, skin or hair change.  Heme/Lymph: No excessive bleeding, bruising or enlarged lymph nodes.  Physical Exam  BP 128/74   Pulse 80   Temp (!) 96.3 F (35.7 C)   Resp 16   Ht 5' 3.5" (1.613 m)   Wt 203 lb (92.1 kg)   BMI 35.40 kg/m   Appears  well nourished, well groomed  and in no distress.  Eyes: PERRLA, EOMs, conjunctiva no swelling or erythema. Sinuses: No frontal/maxillary tenderness ENT/Mouth: EAC's clear, TM's nl w/o erythema, bulging. Nares clear w/o erythema, swelling, exudates. Oropharynx clear without erythema or exudates. Oral hygiene is good. Tongue normal, non obstructing. Hearing intact.  Neck: Supple. Thyroid not palpable. Car 2+/2+ without bruits, nodes or JVD. Chest: Respirations nl with BS clear & equal w/o rales, rhonchi, wheezing or stridor.  Cor: Heart sounds normal w/ regular rate and rhythm without sig. murmurs, gallops, clicks or rubs. Peripheral pulses normal and equal  without edema.  Abdomen:  Soft & bowel sounds normal. Non-tender w/o guarding, rebound, hernias, masses or organomegaly.  Lymphatics: Unremarkable.  Musculoskeletal: Full ROM all peripheral extremities, joint stability, 5/5 strength and normal gait.  Skin: Warm, dry without exposed rashes, lesions or ecchymosis apparent.  Neuro: Cranial nerves intact, reflexes equal bilaterally. Sensory-motor testing grossly intact. Tendon reflexes grossly intact.  Pysch: Alert & oriented x 3.  Insight and judgement nl & appropriate. No ideations.  Assessment and Plan:  1. Essential hypertension  - Continue medication, monitor blood pressure at home.  - Continue DASH diet.  Reminder to go to the ER if any CP,  SOB, nausea, dizziness, severe HA, changes vision/speech.  - CBC with Differential/Platelet - COMPLETE METABOLIC PANEL WITH GFR - Magnesium - TSH  2. Hyperlipidemia associated with type 2 diabetes  mellitus ([place])  - Continue diet/meds, exercise,& lifestyle modifications.  - Continue monitor periodic cholesterol/liver & renal functions   - Lipid panel - TSH  3. Type 2 diabetes mellitus with stage 3a chronic kidney disease,  with long-term current use of insulin (HCC)  - Continue diet, exercise  - Lifestyle modifications.  - Monitor appropriate labs.  - COMPLETE METABOLIC PANEL WITH GFR - Hemoglobin A1c  4. Vitamin D deficiency  - Continue supplementation.  - VITAMIN D 25 Hydroxy  5. Hypothyroidism  - TSH  6. Medication management  - CBC with Differential/Platelet - COMPLETE METABOLIC PANEL WITH GFR - Magnesium - Lipid panel - TSH - Hemoglobin A1c - VITAMIN D 25 Hydroxy        Discussed  regular exercise, BP monitoring, weight control to achieve/maintain BMI less than 25 and discussed med and SE's. Recommended labs to assess and monitor clinical status with further disposition pending results of labs.  I discussed the assessment and treatment plan with the patient. The patient was provided an opportunity to ask questions and all were answered. The patient agreed with the plan and demonstrated an understanding of the instructions.  I provided over 30 minutes of exam, counseling, chart review and  complex critical decision making.   Kirtland Bouchard, MD

## 2020-01-12 NOTE — Patient Instructions (Signed)

## 2020-01-13 ENCOUNTER — Ambulatory Visit (INDEPENDENT_AMBULATORY_CARE_PROVIDER_SITE_OTHER): Payer: Medicare Other | Admitting: Internal Medicine

## 2020-01-13 ENCOUNTER — Other Ambulatory Visit: Payer: Self-pay

## 2020-01-13 VITALS — BP 128/74 | HR 80 | Temp 96.3°F | Resp 16 | Ht 63.5 in | Wt 203.0 lb

## 2020-01-13 DIAGNOSIS — I10 Essential (primary) hypertension: Secondary | ICD-10-CM | POA: Diagnosis not present

## 2020-01-13 DIAGNOSIS — E039 Hypothyroidism, unspecified: Secondary | ICD-10-CM

## 2020-01-13 DIAGNOSIS — E1121 Type 2 diabetes mellitus with diabetic nephropathy: Secondary | ICD-10-CM

## 2020-01-13 DIAGNOSIS — N1831 Chronic kidney disease, stage 3a: Secondary | ICD-10-CM | POA: Diagnosis not present

## 2020-01-13 DIAGNOSIS — E1169 Type 2 diabetes mellitus with other specified complication: Secondary | ICD-10-CM | POA: Diagnosis not present

## 2020-01-13 DIAGNOSIS — E785 Hyperlipidemia, unspecified: Secondary | ICD-10-CM

## 2020-01-13 DIAGNOSIS — Z79899 Other long term (current) drug therapy: Secondary | ICD-10-CM

## 2020-01-13 DIAGNOSIS — Z794 Long term (current) use of insulin: Secondary | ICD-10-CM

## 2020-01-13 DIAGNOSIS — E559 Vitamin D deficiency, unspecified: Secondary | ICD-10-CM | POA: Diagnosis not present

## 2020-01-14 LAB — COMPLETE METABOLIC PANEL WITH GFR
AG Ratio: 1.7 (calc) (ref 1.0–2.5)
ALT: 14 U/L (ref 6–29)
AST: 16 U/L (ref 10–35)
Albumin: 4 g/dL (ref 3.6–5.1)
Alkaline phosphatase (APISO): 41 U/L (ref 37–153)
BUN/Creatinine Ratio: 28 (calc) — ABNORMAL HIGH (ref 6–22)
BUN: 29 mg/dL — ABNORMAL HIGH (ref 7–25)
CO2: 34 mmol/L — ABNORMAL HIGH (ref 20–32)
Calcium: 10.4 mg/dL (ref 8.6–10.4)
Chloride: 96 mmol/L — ABNORMAL LOW (ref 98–110)
Creat: 1.05 mg/dL — ABNORMAL HIGH (ref 0.60–0.88)
GFR, Est African American: 55 mL/min/{1.73_m2} — ABNORMAL LOW (ref >=60)
GFR, Est Non African American: 48 mL/min/{1.73_m2} — ABNORMAL LOW (ref >=60)
Globulin: 2.3 g/dL (calc) (ref 1.9–3.7)
Glucose, Bld: 233 mg/dL — ABNORMAL HIGH (ref 65–99)
Potassium: 5 mmol/L (ref 3.5–5.3)
Sodium: 137 mmol/L (ref 135–146)
Total Bilirubin: 0.2 mg/dL (ref 0.2–1.2)
Total Protein: 6.3 g/dL (ref 6.1–8.1)

## 2020-01-14 LAB — CBC WITH DIFFERENTIAL/PLATELET
Absolute Monocytes: 1145 cells/uL — ABNORMAL HIGH (ref 200–950)
Basophils Absolute: 65 cells/uL (ref 0–200)
Basophils Relative: 0.6 %
Eosinophils Absolute: 665 cells/uL — ABNORMAL HIGH (ref 15–500)
Eosinophils Relative: 6.1 %
HCT: 41.5 % (ref 35.0–45.0)
Hemoglobin: 13.3 g/dL (ref 11.7–15.5)
Lymphs Abs: 2551 cells/uL (ref 850–3900)
MCH: 28.1 pg (ref 27.0–33.0)
MCHC: 32 g/dL (ref 32.0–36.0)
MCV: 87.6 fL (ref 80.0–100.0)
MPV: 11.8 fL (ref 7.5–12.5)
Monocytes Relative: 10.5 %
Neutro Abs: 6475 cells/uL (ref 1500–7800)
Neutrophils Relative %: 59.4 %
Platelets: 360 10*3/uL (ref 140–400)
RBC: 4.74 10*6/uL (ref 3.80–5.10)
RDW: 12.5 % (ref 11.0–15.0)
Total Lymphocyte: 23.4 %
WBC: 10.9 10*3/uL — ABNORMAL HIGH (ref 3.8–10.8)

## 2020-01-14 LAB — HEMOGLOBIN A1C
Hgb A1c MFr Bld: 8 % of total Hgb — ABNORMAL HIGH (ref <5.7)
Mean Plasma Glucose: 183 (calc)
eAG (mmol/L): 10.1 (calc)

## 2020-01-14 LAB — MAGNESIUM: Magnesium: 1.6 mg/dL (ref 1.5–2.5)

## 2020-01-14 LAB — LIPID PANEL
Cholesterol: 154 mg/dL (ref <200)
HDL: 44 mg/dL — ABNORMAL LOW (ref >=50)
LDL Cholesterol (Calc): 82 mg/dL (calc)
Non-HDL Cholesterol (Calc): 110 mg/dL (calc) (ref <130)
Total CHOL/HDL Ratio: 3.5 (calc) (ref <5.0)
Triglycerides: 187 mg/dL — ABNORMAL HIGH (ref <150)

## 2020-01-14 LAB — TSH: TSH: 0.98 mIU/L (ref 0.40–4.50)

## 2020-01-14 LAB — VITAMIN D 25 HYDROXY (VIT D DEFICIENCY, FRACTURES): Vit D, 25-Hydroxy: 48 ng/mL (ref 30–100)

## 2020-01-18 ENCOUNTER — Ambulatory Visit: Payer: Medicare Other | Admitting: Adult Health Nurse Practitioner

## 2020-01-31 ENCOUNTER — Other Ambulatory Visit: Payer: Self-pay | Admitting: Adult Health

## 2020-02-21 ENCOUNTER — Encounter (INDEPENDENT_AMBULATORY_CARE_PROVIDER_SITE_OTHER): Payer: Self-pay | Admitting: Ophthalmology

## 2020-02-21 ENCOUNTER — Other Ambulatory Visit: Payer: Self-pay

## 2020-02-21 ENCOUNTER — Ambulatory Visit (INDEPENDENT_AMBULATORY_CARE_PROVIDER_SITE_OTHER): Payer: Medicare Other | Admitting: Ophthalmology

## 2020-02-21 DIAGNOSIS — H353221 Exudative age-related macular degeneration, left eye, with active choroidal neovascularization: Secondary | ICD-10-CM | POA: Diagnosis not present

## 2020-02-21 MED ORDER — BEVACIZUMAB CHEMO INJECTION 1.25MG/0.05ML SYRINGE FOR KALEIDOSCOPE
1.2500 mg | INTRAVITREAL | Status: AC | PRN
  Administered 2020-02-21: 1.25 mg via INTRAVITREAL

## 2020-02-21 NOTE — Assessment & Plan Note (Signed)
Much less retinal thickening, CME overlying CNVM OS, yet still active.  Repeat intravitreal Avastin OS today to maintain examination repeat in 6 weeks

## 2020-02-21 NOTE — Progress Notes (Signed)
02/21/2020     CHIEF COMPLAINT Patient presents for Retina Follow Up   HISTORY OF PRESENT ILLNESS: Catherine Wood is a 86 y.o. female who presents to the clinic today for:   HPI    Retina Follow Up    Patient presents with  Wet AMD.  In left eye.  Duration of 6 weeks.  Since onset it is stable.          Comments    6 WK follow up- OCT OU, Poss Avastin OS Patient denies change in vision and overall has no complaints.        Last edited by Gerda Diss on 02/21/2020  1:18 PM. (History)      Referring physician: [name], MD [address] [place],  [place] [address]  HISTORICAL INFORMATION:   Selected notes from the MEDICAL RECORD NUMBER    Lab Results  Component Value Date   HGBA1C 8.0 (H) 01/13/2020     CURRENT MEDICATIONS: No current outpatient medications on file. (Ophthalmic Drugs)   No current facility-administered medications for this visit. (Ophthalmic Drugs)   Current Outpatient Medications (Other)  Medication Sig  . ACCU-CHEK SOFTCLIX LANCETS lancets CHECK BLOOD SUGER UP TO 3 TIMES A DAY  . aspirin 81 MG tablet Take 81 mg by mouth daily.  . Cholecalciferol (VITAMIN D3) 5000 UNITS CAPS Take 5,000 Units by mouth daily.   . DULoxetine (CYMBALTA) 30 MG capsule Take 30 mg by mouth daily.  . fenofibrate 160 MG tablet Take 1 tablet Daily for Triglycerides (Blood Fats)  . furosemide (LASIX) 40 MG tablet Take 1 to 2 tablets daily as needed for Fluid Retention / Ankle Swelling  . insulin NPH-regular Human (NOVOLIN 70/30 RELION) (70-30) 100 UNIT/ML injection Inject 50 units into skin 2 x /day before meals or as directed  . Insulin Pen Needle (PEN NEEDLES 31GX5/16") 31G X 8 MM MISC USE 4 TIMES A DAY  . levothyroxine (SYNTHROID) 100 MCG tablet Take 1 tablet daily on an empty stomach with only water for 30 minutes & no Antacid meds, Calcium or Magnesium for 4 hours & avoid Biotin  . losartan (COZAAR) 100 MG tablet TAKE 1 TABLET ONCE A DAY FOR  HIGH BLOOD PRESSURE  . Magnesium 400 MG CAPS Take 1 capsule by mouth 3 (three) times daily.  Marland Kitchen MEGARED OMEGA-3 KRILL OIL 500 MG CAPS Take 500 mg by mouth 2 (two) times daily.  . metFORMIN (GLUCOPHAGE) 1000 MG tablet TAKE (1) TABLET TWICE A DAY WITH MEALS (BREAKFAST AND SUPPER)  . Multiple Vitamins-Minerals (ICAPS PO) Take by mouth daily.  Marland Kitchen OVER THE COUNTER MEDICATION as needed. Macular Degeneration refresh eye drops  . rosuvastatin (CRESTOR) 5 MG tablet Take 1 tablet (5 mg total) by mouth at bedtime.   No current facility-administered medications for this visit. (Other)      REVIEW OF SYSTEMS:    ALLERGIES Allergies  Allergen Reactions  . Detrol [Tolterodine]     PAST MEDICAL HISTORY Past Medical History:  Diagnosis Date  . Arthritis   . GERD (gastroesophageal reflux disease)   . HOH (hard of hearing)   . Hyperlipidemia   . Hypertension   . Hypothyroid   . IDDM (insulin dependent diabetes mellitus)   . Leg swelling   . Macular degeneration   . Shortness of breath dyspnea    with exertion  . Sleep apnea    does not wear CPAP  . Varicose veins   . Wears glasses  Past Surgical History:  Procedure Laterality Date  . ANTERIOR CERVICAL DECOMP/DISCECTOMY FUSION N/A 11/08/2014   Procedure: Cervical three-four,  Cervical six-seven anterior cervical decompression with fusion plating and bonegraft;  Surgeon: Ashok Pall, MD;  Location: [facility];  Service: Neurosurgery;  Laterality: N/A;  Cervical three-four,  Cervical six-seven anterior cervical decompression with fusion plating and bonegraft  . BIOPSY THYROID     benign thyroid nodule removal  . CATARACT EXTRACTION, BILATERAL    . COLONOSCOPY W/ BIOPSIES AND POLYPECTOMY    . ENDOMETRIAL BIOPSY  2000  . SPINE SURGERY Left  11/08/2014   Left ACDF   . TUBAL LIGATION    . VARICOSE VEIN SURGERY      FAMILY HISTORY Family History  Problem Relation Age of Onset  . Cancer Mother        colon  . Cancer Father         GI  . Breast cancer Neg Hx     SOCIAL HISTORY Social History   Tobacco Use  . Smoking status: Never Smoker  . Smokeless tobacco: Never Used  Substance Use Topics  . Alcohol use: No  . Drug use: No         OPHTHALMIC EXAM:  Base Eye Exam    Visual Acuity (Snellen - Linear)      Right Left   Dist cc CF @ 1' 20/30-2   Dist ph cc NI        Tonometry (Tonopen, 1:25 PM)      Right Left   Pressure 15 17       Visual Fields (Counting fingers)      Left Right    Full Full       Neuro/Psych    Oriented x3: Yes   Mood/Affect: Normal       Dilation    Left eye: 1.0% Mydriacyl, 2.5% Phenylephrine @ 1:25 PM        Slit Lamp and Fundus Exam    External Exam      Right Left   External Normal Normal       Slit Lamp Exam      Right Left   Lids/Lashes Normal Normal   Conjunctiva/Sclera White and quiet White and quiet   Cornea Clear Clear   Anterior Chamber Deep and quiet Deep and quiet   Iris Round and reactive Round and reactive   Lens Posterior chamber intraocular lens    Anterior Vitreous Normal Normal       Fundus Exam      Right Left   Posterior Vitreous  Posterior vitreous detachment   Disc  Normal   C/D Ratio  0.3   Macula  Retinal pigment epithelial atrophy, Geographic atrophy, Advanced age related macular degeneration, Cystoid macular edema, less active subretinal neovascular membrane, Microaneurysms   Vessels  Normal   Periphery  Normal          IMAGING AND PROCEDURES  Imaging and Procedures for 02/21/20  OCT, Retina - OU - Both Eyes       Right Eye Quality was good. Scan locations included subfoveal. Central Foveal Thickness: 188. Findings include outer retinal atrophy, central retinal atrophy, inner retinal atrophy.   Left Eye Quality was good. Scan locations included subfoveal. Central Foveal Thickness: 288. Progression has improved. Findings include retinal drusen , central retinal atrophy, outer retinal atrophy, subretinal scarring,  cystoid macular edema.        Intravitreal Injection, Pharmacologic Agent - OS - Left Eye  Time Out 02/21/2020. 1:54 PM. Confirmed correct patient, procedure, site, and patient consented.   Anesthesia Topical anesthesia was used. Anesthetic medications included Akten 3.5%.   Procedure Preparation included Tobramycin 0.3%, 10% betadine to eyelids. A 30 gauge needle was used.   Injection:  1.25 mg Bevacizumab (AVASTIN) SOLN   NDC: [address]-7   Route: Intravitreal, Site: Left Eye, Waste: 0 mg  Post-op Post injection exam found visual acuity of at least counting fingers. The patient tolerated the procedure well. There were no complications. The patient received written and verbal post procedure care education. Post injection medications were not given.                 ASSESSMENT/PLAN:  Exudative age-related macular degeneration of left eye with active choroidal neovascularization (HCC) Much less retinal thickening, CME overlying CNVM OS, yet still active.  Repeat intravitreal Avastin OS today to maintain examination repeat in 6 weeks      ICD-10-CM   1. Exudative age-related macular degeneration of left eye with active choroidal neovascularization (HCC)  H35.3221 OCT, Retina - OU - Both Eyes    Intravitreal Injection, Pharmacologic Agent - OS - Left Eye    Bevacizumab (AVASTIN) SOLN 1.25 mg    1.  Exudative ARMD OS, improved yet chronically active left eye temporal to fovea.  Repeat intravitreal Avastin today  2.  3.  Ophthalmic Meds Ordered this visit:  Meds ordered this encounter  Medications  . Bevacizumab (AVASTIN) SOLN 1.25 mg       Return in about 6 weeks (around 04/03/2020) for dilate, OS, AVASTIN OCT.  There are no Patient Instructions on file for this visit.   Explained the diagnoses, plan, and follow up with the patient and they expressed understanding.  Patient expressed understanding of the importance of proper follow up care.   Clent Demark  Sophiya Morello M.D. Diseases & Surgery of the Retina and Vitreous [facility] [place] 02/21/20     Abbreviations: M myopia (nearsighted); A astigmatism; H hyperopia (farsighted); P presbyopia; Mrx spectacle prescription;  CTL contact lenses; OD right eye; OS left eye; OU both eyes  XT exotropia; ET esotropia; PEK punctate epithelial keratitis; PEE punctate epithelial erosions; DES dry eye syndrome; MGD meibomian gland dysfunction; ATs artificial tears; PFAT's preservative free artificial tears; [place] nuclear sclerotic cataract; PSC posterior subcapsular cataract; ERM epi-retinal membrane; PVD posterior vitreous detachment; RD retinal detachment; DM diabetes mellitus; DR diabetic retinopathy; NPDR non-proliferative diabetic retinopathy; PDR proliferative diabetic retinopathy; CSME clinically significant macular edema; DME diabetic macular edema; dbh dot blot hemorrhages; CWS cotton wool spot; POAG primary open angle glaucoma; C/D cup-to-disc ratio; HVF humphrey visual field; GVF goldmann visual field; OCT optical coherence tomography; IOP intraocular pressure; BRVO Branch retinal vein occlusion; CRVO central retinal vein occlusion; CRAO central retinal artery occlusion; BRAO branch retinal artery occlusion; RT retinal tear; SB scleral buckle; PPV pars plana vitrectomy; VH Vitreous hemorrhage; PRP panretinal laser photocoagulation; IVK intravitreal kenalog; VMT vitreomacular traction; MH Macular hole;  NVD neovascularization of the disc; NVE neovascularization elsewhere; AREDS age related eye disease study; ARMD age related macular degeneration; POAG primary open angle glaucoma; EBMD epithelial/anterior basement membrane dystrophy; ACIOL anterior chamber intraocular lens; IOL intraocular lens; PCIOL posterior chamber intraocular lens; Phaco/IOL phacoemulsification with intraocular lens placement; [place] photorefractive keratectomy; LASIK laser assisted in situ keratomileusis; HTN hypertension; DM diabetes  mellitus; COPD chronic obstructive pulmonary disease

## 2020-04-03 ENCOUNTER — Other Ambulatory Visit: Payer: Self-pay

## 2020-04-03 ENCOUNTER — Ambulatory Visit (INDEPENDENT_AMBULATORY_CARE_PROVIDER_SITE_OTHER): Payer: Medicare Other | Admitting: Ophthalmology

## 2020-04-03 ENCOUNTER — Encounter (INDEPENDENT_AMBULATORY_CARE_PROVIDER_SITE_OTHER): Payer: Self-pay | Admitting: Ophthalmology

## 2020-04-03 DIAGNOSIS — H353221 Exudative age-related macular degeneration, left eye, with active choroidal neovascularization: Secondary | ICD-10-CM

## 2020-04-03 MED ORDER — BEVACIZUMAB CHEMO INJECTION 1.25MG/0.05ML SYRINGE FOR KALEIDOSCOPE
1.2500 mg | INTRAVITREAL | Status: AC | PRN
  Administered 2020-04-03: 1.25 mg via INTRAVITREAL

## 2020-04-03 NOTE — Progress Notes (Signed)
04/03/2020     CHIEF COMPLAINT Patient presents for Retina Follow Up   HISTORY OF PRESENT ILLNESS: Catherine Wood is a 86 y.o. female who presents to the clinic today for:   HPI    Retina Follow Up    Patient presents with  Wet AMD.  In left eye.  This started 6 weeks ago.  Severity is mild.  Duration of 6 weeks.  Since onset it is stable.          Comments    6 Week AMD F/U OS, poss Avastin OS  Pt denies noticeable changes to [place] OU since last visit. Pt denies ocular pain, flashes of light, or floaters OU.         Last edited by Rockie Neighbours, [place] on 04/03/2020  1:03 PM. (History)      Referring physician: [name], MD [address] [place],  [place] [address]  HISTORICAL INFORMATION:   Selected notes from the MEDICAL RECORD NUMBER    Lab Results  Component Value Date   HGBA1C 8.0 (H) 01/13/2020     CURRENT MEDICATIONS: No current outpatient medications on file. (Ophthalmic Drugs)   No current facility-administered medications for this visit. (Ophthalmic Drugs)   Current Outpatient Medications (Other)  Medication Sig  . ACCU-CHEK SOFTCLIX LANCETS lancets CHECK BLOOD SUGER UP TO 3 TIMES A DAY  . aspirin 81 MG tablet Take 81 mg by mouth daily.  . Cholecalciferol (VITAMIN D3) 5000 UNITS CAPS Take 5,000 Units by mouth daily.   . DULoxetine (CYMBALTA) 30 MG capsule Take 30 mg by mouth daily.  . fenofibrate 160 MG tablet Take 1 tablet Daily for Triglycerides (Blood Fats)  . furosemide (LASIX) 40 MG tablet Take 1 to 2 tablets daily as needed for Fluid Retention / Ankle Swelling  . insulin NPH-regular Human (NOVOLIN 70/30 RELION) (70-30) 100 UNIT/ML injection Inject 50 units into skin 2 x /day before meals or as directed  . Insulin Pen Needle (PEN NEEDLES 31GX5/16") 31G X 8 MM MISC USE 4 TIMES A DAY  . levothyroxine (SYNTHROID) 100 MCG tablet Take 1 tablet daily on an empty stomach with only water for 30 minutes & no Antacid meds, Calcium or  Magnesium for 4 hours & avoid Biotin  . losartan (COZAAR) 100 MG tablet TAKE 1 TABLET ONCE A DAY FOR HIGH BLOOD PRESSURE  . Magnesium 400 MG CAPS Take 1 capsule by mouth 3 (three) times daily.  Marland Kitchen MEGARED OMEGA-3 KRILL OIL 500 MG CAPS Take 500 mg by mouth 2 (two) times daily.  . metFORMIN (GLUCOPHAGE) 1000 MG tablet TAKE (1) TABLET TWICE A DAY WITH MEALS (BREAKFAST AND SUPPER)  . Multiple Vitamins-Minerals (ICAPS PO) Take by mouth daily.  Marland Kitchen OVER THE COUNTER MEDICATION as needed. Macular Degeneration refresh eye drops  . rosuvastatin (CRESTOR) 5 MG tablet Take 1 tablet (5 mg total) by mouth at bedtime.   No current facility-administered medications for this visit. (Other)      REVIEW OF SYSTEMS:    ALLERGIES Allergies  Allergen Reactions  . Detrol [Tolterodine]     PAST MEDICAL HISTORY Past Medical History:  Diagnosis Date  . Arthritis   . GERD (gastroesophageal reflux disease)   . HOH (hard of hearing)   . Hyperlipidemia   . Hypertension   . Hypothyroid   . IDDM (insulin dependent diabetes mellitus)   . Leg swelling   . Macular degeneration   . Shortness of breath dyspnea    with exertion  .  Sleep apnea    does not wear CPAP  . Varicose veins   . Wears glasses    Past Surgical History:  Procedure Laterality Date  . ANTERIOR CERVICAL DECOMP/DISCECTOMY FUSION N/A 11/08/2014   Procedure: Cervical three-four,  Cervical six-seven anterior cervical decompression with fusion plating and bonegraft;  Surgeon: Ashok Pall, MD;  Location: [facility];  Service: Neurosurgery;  Laterality: N/A;  Cervical three-four,  Cervical six-seven anterior cervical decompression with fusion plating and bonegraft  . BIOPSY THYROID     benign thyroid nodule removal  . CATARACT EXTRACTION, BILATERAL    . COLONOSCOPY W/ BIOPSIES AND POLYPECTOMY    . ENDOMETRIAL BIOPSY  2000  . SPINE SURGERY Left  11/08/2014   Left ACDF   . TUBAL LIGATION    . VARICOSE VEIN SURGERY      FAMILY  HISTORY Family History  Problem Relation Age of Onset  . Cancer Mother        colon  . Cancer Father        GI  . Breast cancer Neg Hx     SOCIAL HISTORY Social History   Tobacco Use  . Smoking status: Never Smoker  . Smokeless tobacco: Never Used  Substance Use Topics  . Alcohol use: No  . Drug use: No         OPHTHALMIC EXAM:  Base Eye Exam    Visual Acuity (ETDRS)      Right Left   Dist cc CF @ 1' 20/50 +2   Dist ph cc NI NI   Correction: Glasses       Tonometry (Tonopen, 1:06 PM)      Right Left   Pressure 19 19       Pupils      Pupils Dark Light Shape React APD   Right PERRL 3 3 Round Minimal None   Left PERRL 3 3 Round Minimal None       Visual Fields (Counting fingers)      Left Right    Full Full       Extraocular Movement      Right Left    Full Full       Neuro/Psych    Oriented x3: Yes   Mood/Affect: Normal       Dilation    Left eye: 1.0% Mydriacyl, 2.5% Phenylephrine @ 1:06 PM        Slit Lamp and Fundus Exam    External Exam      Right Left   External Normal Normal       Slit Lamp Exam      Right Left   Lids/Lashes Normal Normal   Conjunctiva/Sclera White and quiet White and quiet   Cornea Clear Clear   Anterior Chamber Deep and quiet Deep and quiet   Iris Round and reactive Round and reactive   Lens Posterior chamber intraocular lens    Anterior Vitreous Normal Normal       Fundus Exam      Right Left   Posterior Vitreous  Posterior vitreous detachment   Disc  Normal   C/D Ratio  0.3   Macula  Retinal pigment epithelial atrophy, Geographic atrophy, Advanced age related macular degeneration, Cystoid macular edema, less active subretinal neovascular membrane, Microaneurysms   Vessels  Normal   Periphery  Normal          IMAGING AND PROCEDURES  Imaging and Procedures for 04/03/20  OCT, Retina - OU - Both Eyes  Right Eye Quality was good. Scan locations included subfoveal. Central Foveal Thickness:  188. Progression has been stable. Findings include central retinal atrophy, outer retinal atrophy, subretinal scarring, inner retinal atrophy, abnormal foveal contour.   Left Eye Quality was good. Scan locations included subfoveal. Central Foveal Thickness: 263. Progression has improved. Findings include retinal drusen , abnormal foveal contour, outer retinal atrophy.   Notes OS with much less foveal CME as compared to 5 weeks previous.  Thus intravitreal Avastin has improved with the anatomy.  We will repeat OS today and examination in 6 weeks       Intravitreal Injection, Pharmacologic Agent - OS - Left Eye       Time Out 04/03/2020. 2:06 PM. Confirmed correct patient, procedure, site, and patient consented.   Anesthesia Topical anesthesia was used. Anesthetic medications included [place] 3.5%.   Procedure Preparation included Ofloxacin , Tobramycin 0.3%, 10% betadine to eyelids, 5% betadine to ocular surface. A 30 gauge needle was used.   Injection:  1.25 mg Bevacizumab (AVASTIN) SOLN   NDC: [address]-9, Lot: [id number]   Route: Intravitreal, Site: Left Eye, Waste: 0 mg  Post-op Post injection exam found visual acuity of at least counting fingers. The patient tolerated the procedure well. There were no complications. The patient received written and verbal post procedure care education. Post injection medications were not given.                 ASSESSMENT/PLAN:  Exudative age-related macular degeneration of left eye with active choroidal neovascularization (HCC) Improved anatomy OS, status post intravitreal Avastin 5 weeks previous.  We will repeat injection today to maintain and repeat examination in 6 weeks to minimize the risk of atrophy progression      ICD-10-CM   1. Exudative age-related macular degeneration of left eye with active choroidal neovascularization (HCC)  H35.3221 OCT, Retina - OU - Both Eyes    Intravitreal Injection, Pharmacologic Agent - OS - Left Eye     Bevacizumab (AVASTIN) SOLN 1.25 mg    1.  2.  3.  Ophthalmic Meds Ordered this visit:  Meds ordered this encounter  Medications  . Bevacizumab (AVASTIN) SOLN 1.25 mg       Return in about 6 weeks (around 05/15/2020) for DILATE OU, AVASTIN OCT, OS.  There are no Patient Instructions on file for this visit.   Explained the diagnoses, plan, and follow up with the patient and they expressed understanding.  Patient expressed understanding of the importance of proper follow up care.   Clent Demark Tayson Schnelle M.D. Diseases & Surgery of the Retina and Vitreous [facility] [place] 04/03/20     Abbreviations: M myopia (nearsighted); A astigmatism; H hyperopia (farsighted); P presbyopia; Mrx spectacle prescription;  CTL contact lenses; OD right eye; OS left eye; OU both eyes  XT exotropia; ET esotropia; PEK punctate epithelial keratitis; PEE punctate epithelial erosions; DES dry eye syndrome; MGD meibomian gland dysfunction; ATs artificial tears; PFAT's preservative free artificial tears; [place] nuclear sclerotic cataract; PSC posterior subcapsular cataract; ERM epi-retinal membrane; PVD posterior vitreous detachment; RD retinal detachment; DM diabetes mellitus; DR diabetic retinopathy; NPDR non-proliferative diabetic retinopathy; PDR proliferative diabetic retinopathy; CSME clinically significant macular edema; DME diabetic macular edema; dbh dot blot hemorrhages; CWS cotton wool spot; POAG primary open angle glaucoma; C/D cup-to-disc ratio; HVF humphrey visual field; GVF goldmann visual field; OCT optical coherence tomography; IOP intraocular pressure; BRVO Branch retinal vein occlusion; CRVO central retinal vein occlusion; CRAO central retinal artery occlusion; BRAO branch retinal  artery occlusion; RT retinal tear; SB scleral buckle; PPV pars plana vitrectomy; VH Vitreous hemorrhage; PRP panretinal laser photocoagulation; IVK intravitreal kenalog; VMT vitreomacular traction; MH Macular hole;   NVD neovascularization of the disc; NVE neovascularization elsewhere; AREDS age related eye disease study; ARMD age related macular degeneration; POAG primary open angle glaucoma; EBMD epithelial/anterior basement membrane dystrophy; ACIOL anterior chamber intraocular lens; IOL intraocular lens; PCIOL posterior chamber intraocular lens; Phaco/IOL phacoemulsification with intraocular lens placement; [place] photorefractive keratectomy; LASIK laser assisted in situ keratomileusis; HTN hypertension; DM diabetes mellitus; COPD chronic obstructive pulmonary disease

## 2020-04-03 NOTE — Assessment & Plan Note (Signed)
Improved anatomy OS, status post intravitreal Avastin 5 weeks previous.  We will repeat injection today to maintain and repeat examination in 6 weeks to minimize the risk of atrophy progression

## 2020-04-05 ENCOUNTER — Other Ambulatory Visit: Payer: Self-pay | Admitting: Adult Health

## 2020-04-26 NOTE — Progress Notes (Signed)
FOLLOW UP  Assessment and Plan:   Hypertension Well controlled with current medications  Monitor blood pressure at home; patient to call if consistently greater than 130/80 Continue DASH diet.   Reminder to go to the ER if any CP, SOB, nausea, dizziness, severe HA, changes vision/speech, left arm numbness and tingling and jaw pain.  Cholesterol Currently above goal; titrate rosuvastatin for LDL goal <70 Continue low cholesterol diet and exercise.  Check lipid panel.   Diabetes with diabetic neuropathy, unspecified Continue medication: reduce metformin if needed for renal functions, novolin 70/30 - 48 units in AM and 38 units PM - son adjusts +/- 5 units depending on glucose Discussed goal fasting <150, doing well recently  Continue diet and exercise.  Perform daily foot/skin check, notify office of any concerning changes.  Check A1C  Obesity with co morbidities Long discussion about weight loss, diet, and exercise Recommended diet heavy in fruits and veggies and low in animal meats, cheeses, and dairy products, appropriate calorie intake Discussed ideal weight for height Will follow up in 3 months  Vitamin D Def Below goal at last visit; questionable medication compliance continue supplementation to maintain goal of 60-100 Chec Vit D level to CPE  Vascular dementia ([place]) Mild dementia; son is helping with meds for compliance; ongoing monitoring to reduce pill burden when possible Encouraged regular exercise program  Control blood pressure, cholesterol, glucose, increase exercise.    Continue diet and meds as discussed. Further disposition pending results of labs. Discussed med's effects and SE's.   Over 30 minutes of exam, counseling, chart review, and critical decision making was performed.   Future Appointments  Date Time Provider [place]  05/16/2020  1:15 PM Rankin, Clent Demark, MD [facility] None  08/07/2020  2:00 PM [name], MD [facility] None  11/08/2020   2:00 PM Liane Comber, NP [facility] None    ----------------------------------------------------------------------------------------------------------------------  HPI 86 y.o. female  presents for 3 month follow up on hypertension, cholesterol, diabetes, weight and vitamin D deficiency.   She has poor short term memory, MRI from 10/2014 showed atrophy and small vessel disease. Lab workup was normal, declined neuro referral. Concern with med adherence in previous years,  is accompanied by her son, who has been taking care of medications, lives next to her and assists with medications 3 times a day.   She is following closely with Dr. Zadie Rhine for exudative macular degeneration of L eye for avastin injections q6w.   BMI is Body mass index is 35.22 kg/m., she has not been working on diet and exercise. Per son very sedetary, has a hard time keeping her out of bed Wt Readings from Last 3 Encounters:  04/27/20 202 lb (91.6 kg)  01/13/20 203 lb (92.1 kg)  11/11/19 203 lb 3.2 oz (92.2 kg)   Her blood pressure has been controlled at home, today their BP is BP: 122/66  She does not workout. She denies chest pain, shortness of breath, dizziness. She has venous insufficiency and is taking lasix 40 mg BID PRN, but taking rarely. Does put feet up on a recliner if sitting down.    She is on cholesterol medication switched from pravastatin 40 mg to rosuvastatin 5 mg, fenofibrate 160 mg daily and denies myalgias. Her cholesterol is not at goal. The cholesterol last visit was:   Lab Results  Component Value Date   CHOL 154 01/13/2020   HDL 44 (L) 01/13/2020   LDLCALC 82 01/13/2020   TRIG 187 (H) 01/13/2020   CHOLHDL  3.5 01/13/2020    She has not been working on diet and exercise for T2 diabetes (on metformin 1000 mg BID due to, novolin 70/30 48 units AM, and 38 units PM (son adjusts as needed), and denies foot ulcerations, hypoglycemia , increased appetite, nausea, polydipsia, polyuria, visual  disturbances, vomiting and weight loss.  She has neuropathy and takes cymbalta 30 mg daily, off of gabapentin Checking fasting glucose - typically 120-130s, lowest was 90, highest was 200 Checks prior to dinner - typically 140-160 Last A1C in the office was:  Lab Results  Component Value Date   HGBA1C 8.0 (H) 01/13/2020   She has CKD III monitored at this office, drinks ~10 x 6 oz bottles of water Lab Results  Component Value Date   GFRNONAA 48 (L) 01/13/2020   She is on thyroid medication. Her medication was not changed last visit.   Lab Results  Component Value Date   TSH 0.98 01/13/2020   Patient is on Vitamin D supplement.   Lab Results  Component Value Date   VD25OH 48 01/13/2020       Current Medications:  Current Outpatient Medications on File Prior to Visit  Medication Sig  . ACCU-CHEK SOFTCLIX LANCETS lancets CHECK BLOOD SUGER UP TO 3 TIMES A DAY  . aspirin 81 MG tablet Take 81 mg by mouth daily.  . Cholecalciferol (VITAMIN D3) 5000 UNITS CAPS Take 5,000 Units by mouth daily.   . DULoxetine (CYMBALTA) 30 MG capsule Take 30 mg by mouth daily.  . fenofibrate 160 MG tablet Take 1 tablet Daily for Triglycerides (Blood Fats)  . furosemide (LASIX) 40 MG tablet Take 1 to 2 tablets daily as needed for Fluid Retention / Ankle Swelling  . insulin NPH-regular Human (NOVOLIN 70/30 RELION) (70-30) 100 UNIT/ML injection Inject 50 units into skin 2 x /day before meals or as directed  . Insulin Pen Needle (PEN NEEDLES 31GX5/16") 31G X 8 MM MISC USE 4 TIMES A DAY  . levothyroxine (SYNTHROID) 100 MCG tablet Take 1 tablet daily on an empty stomach with only water for 30 minutes & no Antacid meds, Calcium or Magnesium for 4 hours & avoid Biotin  . losartan (COZAAR) 100 MG tablet TAKE 1 TABLET ONCE A DAY FOR HIGH BLOOD PRESSURE  . Magnesium 400 MG CAPS Take 1 capsule by mouth 3 (three) times daily.  Marland Kitchen MEGARED OMEGA-3 KRILL OIL 500 MG CAPS Take 500 mg by mouth 2 (two) times daily.  .  metFORMIN (GLUCOPHAGE) 1000 MG tablet TAKE (1) TABLET TWICE A DAY WITH MEALS (BREAKFAST AND SUPPER)  . Multiple Vitamins-Minerals (ICAPS PO) Take by mouth daily.  . rosuvastatin (CRESTOR) 5 MG tablet TAKE ONE TABLET AT BEDTIME  . OVER THE COUNTER MEDICATION as needed. Macular Degeneration refresh eye drops (Patient not taking: Reported on 04/27/2020)   No current facility-administered medications on file prior to visit.     Allergies:  Allergies  Allergen Reactions  . Detrol [Tolterodine]      Medical History:  Past Medical History:  Diagnosis Date  . Arthritis   . GERD (gastroesophageal reflux disease)   . HOH (hard of hearing)   . Hyperlipidemia   . Hypertension   . Hypothyroid   . IDDM (insulin dependent diabetes mellitus)   . Leg swelling   . Macular degeneration   . Shortness of breath dyspnea    with exertion  . Sleep apnea    does not wear CPAP  . Varicose veins   . Wears glasses  Family history- Reviewed and unchanged Social history- Reviewed and unchanged   Review of Systems:  Review of Systems  Constitutional: Negative for malaise/fatigue and weight loss.  HENT: Negative for hearing loss and tinnitus.   Eyes: Positive for blurred vision (chronic, following Dr. Zadie Rhine for MD). Negative for double vision.  Respiratory: Negative for cough, shortness of breath and wheezing.   Cardiovascular: Negative for chest pain, palpitations, orthopnea, claudication and leg swelling.  Gastrointestinal: Negative for abdominal pain, blood in stool, constipation, diarrhea, heartburn, melena, nausea and vomiting.  Genitourinary: Negative.   Musculoskeletal: Negative for falls, joint pain and myalgias.  Skin: Negative for rash.  Neurological: Negative for dizziness, tingling, sensory change, weakness and headaches.  Endo/Heme/Allergies: Negative for polydipsia.  Psychiatric/Behavioral: Positive for memory loss (chronic short term memory). Negative for depression and substance  abuse. The patient is not nervous/anxious and does not have insomnia.   All other systems reviewed and are negative.   Physical Exam: BP 122/66   Pulse 75   Temp (!) 97.3 F (36.3 C)   Wt 202 lb (91.6 kg)   SpO2 95%   BMI 35.22 kg/m  Wt Readings from Last 3 Encounters:  04/27/20 202 lb (91.6 kg)  01/13/20 203 lb (92.1 kg)  11/11/19 203 lb 3.2 oz (92.2 kg)   General Appearance: Well nourished, well dressed, elderly female in no apparent distress. Eyes: PERRLA, EOMs, conjunctiva no swelling or erythema Sinuses: No Frontal/maxillary tenderness ENT/Mouth: Ext aud canals clear, TMs without erythema, bulging. No erythema, swelling, or exudate on post pharynx.  Tonsils not swollen or erythematous. Mildly HOH.  Neck: Supple, thyroid normal.  Respiratory: Respiratory effort normal, BS equal bilaterally without rales, rhonchi, wheezing or stridor.  Cardio: RRR with no MRGs. Intact peripheral pulses with mild 1+ non-pitting edema Abdomen: Soft, + BS.  Non tender, no guarding, rebound, hernias, masses. Lymphatics: Non tender without lymphadenopathy.  Musculoskeletal: Mild bony deformities bil DIP joints, no effusions, 5/5 strength, slow gait with cane Skin: Warm, dry without rashes, lesions, ecchymosis.  Neuro: Cranial nerves intact. No cerebellar symptoms, poor short term memory  Psych: Awake and oriented X 3, normal affect, Insight and Judgment fair  MMSE: 28/30  Izora Ribas, NP 2:51 PM [facility]

## 2020-04-27 ENCOUNTER — Ambulatory Visit (INDEPENDENT_AMBULATORY_CARE_PROVIDER_SITE_OTHER): Payer: Medicare Other | Admitting: Adult Health

## 2020-04-27 ENCOUNTER — Other Ambulatory Visit: Payer: Self-pay

## 2020-04-27 ENCOUNTER — Encounter: Payer: Self-pay | Admitting: Adult Health

## 2020-04-27 VITALS — BP 122/66 | HR 75 | Temp 97.3°F | Wt 202.0 lb

## 2020-04-27 DIAGNOSIS — E1142 Type 2 diabetes mellitus with diabetic polyneuropathy: Secondary | ICD-10-CM

## 2020-04-27 DIAGNOSIS — E785 Hyperlipidemia, unspecified: Secondary | ICD-10-CM

## 2020-04-27 DIAGNOSIS — E1122 Type 2 diabetes mellitus with diabetic chronic kidney disease: Secondary | ICD-10-CM | POA: Diagnosis not present

## 2020-04-27 DIAGNOSIS — I10 Essential (primary) hypertension: Secondary | ICD-10-CM

## 2020-04-27 DIAGNOSIS — E669 Obesity, unspecified: Secondary | ICD-10-CM

## 2020-04-27 DIAGNOSIS — N183 Chronic kidney disease, stage 3 unspecified: Secondary | ICD-10-CM

## 2020-04-27 DIAGNOSIS — F0150 Vascular dementia without behavioral disturbance: Secondary | ICD-10-CM

## 2020-04-27 DIAGNOSIS — E1169 Type 2 diabetes mellitus with other specified complication: Secondary | ICD-10-CM

## 2020-04-27 DIAGNOSIS — H353221 Exudative age-related macular degeneration, left eye, with active choroidal neovascularization: Secondary | ICD-10-CM

## 2020-04-27 DIAGNOSIS — E039 Hypothyroidism, unspecified: Secondary | ICD-10-CM

## 2020-04-27 DIAGNOSIS — E1121 Type 2 diabetes mellitus with diabetic nephropathy: Secondary | ICD-10-CM

## 2020-04-27 DIAGNOSIS — Z794 Long term (current) use of insulin: Secondary | ICD-10-CM

## 2020-04-27 DIAGNOSIS — N1832 Chronic kidney disease, stage 3b: Secondary | ICD-10-CM

## 2020-04-27 NOTE — Patient Instructions (Signed)
Goals     Blood Pressure < 130/80     Exercise 150 min/wk Moderate Activity     Aim for 15-20 min once or twice daily, walking, resistance bands, video programs, etc      fasting glucose     Aim for <150, call if having persistently higher than this, or with any low sugars     Weight (lb) < 200 lb (90.7 kg)         Please work on exercise program;   Try to do 10-20 min twice a day, or 30 min daily Try video programs, pedal machine, or taking a walk  This will help diabetes/sugars, and MEMORY It's very important to do this regularly    Vascular Dementia Dementia is a condition in which a person has problems with thinking, memory, and behavior that are severe enough to interfere with daily life. Vascular dementia is a type of dementia. It results from brain damage that is caused by the brain not getting enough blood. This condition may also be called vascular cognitive impairment. What are the causes? Vascular dementia is caused by conditions that lessen blood flow to the brain. Common causes of this condition include:  Multiple small strokes. These may happen without symptoms (silent stroke).  Major stroke.  Damage to small blood vessels in the brain (cerebral small vessel disease). What increases the risk? The following factors may make you more likely to develop this condition:  Having had a stroke.  Having high blood pressure (hypertension) or high cholesterol.  Having a disease that affects the heart or blood vessels.  Smoking.  Having diabetes.  Having metabolic syndrome.  Being obese.  Not being active.  Having depression.  Being over age 57. What are the signs or symptoms? Symptoms can vary from one person to another. Symptoms may be mild or severe depending on the amount of damage and which parts of the brain have been affected. Symptoms may begin suddenly or may develop slowly. Mental symptoms of vascular dementia may  include:  Confusion.  Memory problems.  Poor attention and concentration.  Trouble understanding speech.  Depression.  Personality changes.  Trouble recognizing familiar people.  Agitation or aggression.  Paranoia.  Delusions or hallucinations. Physical symptoms of vascular dementia may include:  Weakness.  Poor balance.  Loss of bladder or bowel control (incontinence).  Unsteady walking (gait).  Speaking problems. Behavioral symptoms of vascular dementia may include:  Getting lost in familiar places.  Problems with planning and judgment.  Trouble following instructions.  Social problems.  Emotional outbursts.  Trouble with daily activities and self-care.  Problems handling money. Symptoms may remain stable, or they may get worse over time. Symptoms of vascular dementia may be similar to those of Alzheimer's disease. The two conditions can occur together (mixed dementia). How is this diagnosed? Your health care provider will consider your medical history and symptoms or changes that are reported by friends and family. Your health care provider will do a physical exam and may order lab tests or other tests that check brain and nervous system function. Tests that may be done include:  Blood tests.  Brain imaging tests.  Tests of movement, speech, and other daily activities (neurological exam).  Tests of memory, thinking, and problem-solving (neuropsychological or neurocognitive testing). There is not a specific test to diagnose vascular dementia. Diagnosis may involve several specialists. These may include:  A health care provider who specializes in the brain and nervous system (neurologist).  A health provider  who specializes in understanding how problems in the brain can alter behavior and cognitive function (neuropsychologist). How is this treated? There is no cure for vascular dementia. Brain damage that has already occurred cannot be reversed.  Treatment depends on:  How severe the condition is.  Which parts of your brain have been affected.  Your overall health. Treatment measures aim to:  Treat the underlying cause of vascular dementia and manage risk factors. This may include: ? Controlling blood pressure. ? Lowering cholesterol. ? Treating diabetes. ? Quitting smoking. ? Losing weight or maintaining a healthy weight. ? Eating a healthy, balanced diet. ? Getting regular exercise.  Manage symptoms.  Prevent further brain damage.  Improve the person's health and quality of life. Treatment for dementia may involve a team of health care providers, including:  A neurologist.  A provider who specializes in disorders of the mind (psychiatrist).  A provider who specializes in helping people learn daily living skills (occupational therapist).  A provider who focuses on speech and language changes (Electrical engineer).  A heart specialist (cardiologist).  A provider who helps people learn how to manage physical changes, such as movement and walking (exercise physiologist or physical therapist). Follow these instructions at home: Lifestyle  People with vascular dementia may need regular help at home or daily care from a family member or home health care worker. Home care for a person with vascular dementia depends on what caused the condition and how severe the symptoms are. General guidelines for caregivers include:  Help the person with dementia remember people, appointments, and daily activities.  Help the person with dementia manage his or her medicines.  Help family and friends learn about ways to communicate with the person with dementia.  Create a safe living space to reduce the risk of injury or falls.  Find a support group to help caregivers and family cope with the effects of dementia.  General instructions  Help the person take over-the-counter and prescription medicines only as told by the health  care provider.  Follow the health care provider's instructions for treating the condition that caused the dementia.  Make sure the person keeps all follow-up visits as told by the health care provider. This is important. Contact a health care provider if:  A fever develops.  New behavioral problems develop.  Problems with swallowing develop.  Confusion gets worse.  Sleepiness gets worse. Get help right away if:  Loss of consciousness occurs.  There is a sudden loss of speech, balance, or thinking ability.  New numbness or paralysis occurs.  Sudden, severe headache occurs.  Vision is lost or suddenly gets worse in one or both eyes. Summary  Vascular dementia is a type of dementia. It results from brain damage that is caused by the brain not getting enough blood.  Vascular dementia is caused by conditions that lessen blood flow to the brain. Common causes of this condition include stroke and damage to small blood vessels in the brain.  Treatment focuses on treating the underlying cause of vascular dementia and managing any risk factors.  People with vascular dementia may need regular help at home or daily care from a family member or home health care worker.  Contact a health care provider if you or your caregiver notice any new symptoms. This information is not intended to replace advice given to you by your health care provider. Make sure you discuss any questions you have with your health care provider. Document Revised: 05/27/2018 Document Reviewed: 05/28/2018 Elsevier  Patient Education  [employer].

## 2020-04-28 ENCOUNTER — Other Ambulatory Visit: Payer: Self-pay | Admitting: Adult Health

## 2020-04-28 LAB — CBC WITH DIFFERENTIAL/PLATELET
Absolute Monocytes: 1071 cells/uL — ABNORMAL HIGH (ref 200–950)
Basophils Absolute: 62 cells/uL (ref 0–200)
Basophils Relative: 0.6 %
Eosinophils Absolute: 406 cells/uL (ref 15–500)
Eosinophils Relative: 3.9 %
HCT: 37.6 % (ref 35.0–45.0)
Hemoglobin: 12.4 g/dL (ref 11.7–15.5)
Lymphs Abs: 2330 cells/uL (ref 850–3900)
MCH: 29.2 pg (ref 27.0–33.0)
MCHC: 33 g/dL (ref 32.0–36.0)
MCV: 88.5 fL (ref 80.0–100.0)
MPV: 11.5 fL (ref 7.5–12.5)
Monocytes Relative: 10.3 %
Neutro Abs: 6531 cells/uL (ref 1500–7800)
Neutrophils Relative %: 62.8 %
Platelets: 398 10*3/uL (ref 140–400)
RBC: 4.25 10*6/uL (ref 3.80–5.10)
RDW: 12.7 % (ref 11.0–15.0)
Total Lymphocyte: 22.4 %
WBC: 10.4 10*3/uL (ref 3.8–10.8)

## 2020-04-28 LAB — COMPLETE METABOLIC PANEL WITH GFR
AG Ratio: 1.8 (calc) (ref 1.0–2.5)
ALT: 14 U/L (ref 6–29)
AST: 18 U/L (ref 10–35)
Albumin: 4.2 g/dL (ref 3.6–5.1)
Alkaline phosphatase (APISO): 32 U/L — ABNORMAL LOW (ref 37–153)
BUN/Creatinine Ratio: 30 (calc) — ABNORMAL HIGH (ref 6–22)
BUN: 34 mg/dL — ABNORMAL HIGH (ref 7–25)
CO2: 32 mmol/L (ref 20–32)
Calcium: 10 mg/dL (ref 8.6–10.4)
Chloride: 95 mmol/L — ABNORMAL LOW (ref 98–110)
Creat: 1.15 mg/dL — ABNORMAL HIGH (ref 0.60–0.88)
GFR, Est African American: 50 mL/min/{1.73_m2} — ABNORMAL LOW (ref >=60)
GFR, Est Non African American: 43 mL/min/{1.73_m2} — ABNORMAL LOW (ref >=60)
Globulin: 2.4 g/dL (calc) (ref 1.9–3.7)
Glucose, Bld: 190 mg/dL — ABNORMAL HIGH (ref 65–99)
Potassium: 4.7 mmol/L (ref 3.5–5.3)
Sodium: 136 mmol/L (ref 135–146)
Total Bilirubin: 0.3 mg/dL (ref 0.2–1.2)
Total Protein: 6.6 g/dL (ref 6.1–8.1)

## 2020-04-28 LAB — HEMOGLOBIN A1C
Hgb A1c MFr Bld: 7.2 % of total Hgb — ABNORMAL HIGH (ref <5.7)
Mean Plasma Glucose: 160 (calc)
eAG (mmol/L): 8.9 (calc)

## 2020-04-28 LAB — LIPID PANEL
Cholesterol: 147 mg/dL (ref <200)
HDL: 44 mg/dL — ABNORMAL LOW (ref >=50)
LDL Cholesterol (Calc): 70 mg/dL (calc)
Non-HDL Cholesterol (Calc): 103 mg/dL (calc) (ref <130)
Total CHOL/HDL Ratio: 3.3 (calc) (ref <5.0)
Triglycerides: 238 mg/dL — ABNORMAL HIGH (ref <150)

## 2020-04-28 LAB — MAGNESIUM: Magnesium: 1.6 mg/dL (ref 1.5–2.5)

## 2020-04-28 LAB — TSH: TSH: 2.43 mIU/L (ref 0.40–4.50)

## 2020-04-28 MED ORDER — METFORMIN HCL 1000 MG PO TABS: 1000.0000 mg | ORAL_TABLET | Freq: Every day | ORAL | 1 refills | Status: DC

## 2020-05-13 ENCOUNTER — Other Ambulatory Visit: Payer: Self-pay | Admitting: Adult Health

## 2020-05-16 ENCOUNTER — Encounter (INDEPENDENT_AMBULATORY_CARE_PROVIDER_SITE_OTHER): Payer: Medicare Other | Admitting: Ophthalmology

## 2020-05-23 ENCOUNTER — Encounter (INDEPENDENT_AMBULATORY_CARE_PROVIDER_SITE_OTHER): Payer: Self-pay | Admitting: Ophthalmology

## 2020-05-23 ENCOUNTER — Ambulatory Visit (INDEPENDENT_AMBULATORY_CARE_PROVIDER_SITE_OTHER): Payer: Medicare Other | Admitting: Ophthalmology

## 2020-05-23 ENCOUNTER — Other Ambulatory Visit: Payer: Self-pay

## 2020-05-23 DIAGNOSIS — H43812 Vitreous degeneration, left eye: Secondary | ICD-10-CM

## 2020-05-23 DIAGNOSIS — H353221 Exudative age-related macular degeneration, left eye, with active choroidal neovascularization: Secondary | ICD-10-CM | POA: Diagnosis not present

## 2020-05-23 DIAGNOSIS — H353134 Nonexudative age-related macular degeneration, bilateral, advanced atrophic with subfoveal involvement: Secondary | ICD-10-CM | POA: Diagnosis not present

## 2020-05-23 MED ORDER — BEVACIZUMAB CHEMO INJECTION 1.25MG/0.05ML SYRINGE FOR KALEIDOSCOPE
1.2500 mg | INTRAVITREAL | Status: AC | PRN
  Administered 2020-05-23: 1.25 mg via INTRAVITREAL

## 2020-05-23 NOTE — Patient Instructions (Signed)
Patient instructed to notify the office promptly if new visual acuity declines or changes develop

## 2020-05-23 NOTE — Assessment & Plan Note (Signed)

## 2020-05-23 NOTE — Progress Notes (Signed)
05/23/2020     CHIEF COMPLAINT Patient presents for Retina Follow Up   HISTORY OF PRESENT ILLNESS: Stephonie Wood is a 86 y.o. female who presents to the clinic today for:   HPI    Retina Follow Up    Patient presents with  Wet AMD.  In left eye.  This started 7 weeks ago.  Severity is mild.  Duration of 7 weeks.  Since onset it is stable.          Comments    7 Week AMD F/U OU, poss Avastin OS  Pt denies noticeable changes to [place] OU since last visit. Pt denies ocular pain, flashes of light, or floaters OU.         Last edited by Rockie Neighbours, [place] on 05/23/2020  1:23 PM. (History)      Referring physician: [name], MD [address] [place],  [place] [address]  HISTORICAL INFORMATION:   Selected notes from the MEDICAL RECORD NUMBER    Lab Results  Component Value Date   HGBA1C 7.2 (H) 04/27/2020     CURRENT MEDICATIONS: No current outpatient medications on file. (Ophthalmic Drugs)   No current facility-administered medications for this visit. (Ophthalmic Drugs)   Current Outpatient Medications (Other)  Medication Sig  . ACCU-CHEK SOFTCLIX LANCETS lancets CHECK BLOOD SUGER UP TO 3 TIMES A DAY  . aspirin 81 MG tablet Take 81 mg by mouth daily.  . Cholecalciferol (VITAMIN D3) 5000 UNITS CAPS Take 5,000 Units by mouth daily.   . DULoxetine (CYMBALTA) 30 MG capsule Take 30 mg by mouth daily.  . fenofibrate 160 MG tablet Take 1 tablet Daily for Triglycerides (Blood Fats)  . furosemide (LASIX) 40 MG tablet Take 1 to 2 tablets daily as needed for Fluid Retention / Ankle Swelling  . insulin NPH-regular Human (NOVOLIN 70/30 RELION) (70-30) 100 UNIT/ML injection Inject 50 units into skin 2 x /day before meals or as directed  . Insulin Pen Needle (PEN NEEDLES 31GX5/16") 31G X 8 MM MISC USE 4 TIMES A DAY  . levothyroxine (SYNTHROID) 100 MCG tablet Take 1 tablet daily on an empty stomach with only water for 30 minutes & no Antacid meds, Calcium or  Magnesium for 4 hours & avoid Biotin  . losartan (COZAAR) 100 MG tablet TAKE 1 TABLET ONCE A DAY FOR HIGH BLOOD PRESSURE  . Magnesium 400 MG CAPS Take 1 capsule by mouth 3 (three) times daily.  Marland Kitchen MEGARED OMEGA-3 KRILL OIL 500 MG CAPS Take 500 mg by mouth 2 (two) times daily.  . metFORMIN (GLUCOPHAGE) 1000 MG tablet Take 1 to 2 tablets 2 x /day with Meals for Diabetes  . Multiple Vitamins-Minerals (ICAPS PO) Take by mouth daily.  Marland Kitchen OVER THE COUNTER MEDICATION as needed. Macular Degeneration refresh eye drops (Patient not taking: Reported on 04/27/2020)  . rosuvastatin (CRESTOR) 5 MG tablet TAKE ONE TABLET AT BEDTIME   No current facility-administered medications for this visit. (Other)      REVIEW OF SYSTEMS:    ALLERGIES Allergies  Allergen Reactions  . Detrol [Tolterodine]     PAST MEDICAL HISTORY Past Medical History:  Diagnosis Date  . Arthritis   . GERD (gastroesophageal reflux disease)   . HOH (hard of hearing)   . Hyperlipidemia   . Hypertension   . Hypothyroid   . IDDM (insulin dependent diabetes mellitus)   . Leg swelling   . Macular degeneration   . Shortness of breath dyspnea    with  exertion  . Sleep apnea    does not wear CPAP  . Varicose veins   . Wears glasses    Past Surgical History:  Procedure Laterality Date  . ANTERIOR CERVICAL DECOMP/DISCECTOMY FUSION N/A 11/08/2014   Procedure: Cervical three-four,  Cervical six-seven anterior cervical decompression with fusion plating and bonegraft;  Surgeon: Ashok Pall, MD;  Location: [facility];  Service: Neurosurgery;  Laterality: N/A;  Cervical three-four,  Cervical six-seven anterior cervical decompression with fusion plating and bonegraft  . BIOPSY THYROID     benign thyroid nodule removal  . CATARACT EXTRACTION, BILATERAL    . COLONOSCOPY W/ BIOPSIES AND POLYPECTOMY    . ENDOMETRIAL BIOPSY  2000  . SPINE SURGERY Left  11/08/2014   Left ACDF   . TUBAL LIGATION    . VARICOSE VEIN SURGERY       FAMILY HISTORY Family History  Problem Relation Age of Onset  . Cancer Mother        colon  . Cancer Father        GI  . Breast cancer Neg Hx     SOCIAL HISTORY Social History   Tobacco Use  . Smoking status: Never Smoker  . Smokeless tobacco: Never Used  Substance Use Topics  . Alcohol use: No  . Drug use: No         OPHTHALMIC EXAM:  Base Eye Exam    Visual Acuity (ETDRS)      Right Left   Dist cc CF @ 1' 20/60 +1   Dist ph cc NI 20/50 -2   Correction: Glasses       Tonometry (Tonopen, 1:25 PM)      Right Left   Pressure 10 15       Pupils      Pupils Dark Light Shape React APD   Right PERRL 3 3 Round Minimal None   Left PERRL 3 3 Round Minimal None       Visual Fields (Counting fingers)      Left Right    Full Full       Extraocular Movement      Right Left    Full Full       Neuro/Psych    Oriented x3: Yes   Mood/Affect: Normal       Dilation    Both eyes: 1.0% Mydriacyl, 2.5% Phenylephrine @ 1:27 PM        Slit Lamp and Fundus Exam    External Exam      Right Left   External Normal Normal       Slit Lamp Exam      Right Left   Lids/Lashes Normal Normal   Conjunctiva/Sclera White and quiet White and quiet   Cornea Clear Clear   Anterior Chamber Deep and quiet Deep and quiet   Iris Round and reactive Round and reactive   Lens Posterior chamber intraocular lens    Anterior Vitreous Normal Normal       Fundus Exam      Right Left   Posterior Vitreous Posterior vitreous detachment Posterior vitreous detachment   Disc Normal Normal   C/D Ratio 0.4 0.3   Macula Geographic atrophy subfoveal Retinal pigment epithelial atrophy, Geographic atrophy, Advanced age related macular degeneration,Microaneurysms   Vessels Normal Normal   Periphery Normal Normal          IMAGING AND PROCEDURES  Imaging and Procedures for 05/23/20  OCT, Retina - OU - Both Eyes  Right Eye Quality was good. Scan locations included  subfoveal. Central Foveal Thickness: 213. Progression has been stable.   Left Eye Quality was good. Scan locations included subfoveal. Central Foveal Thickness: 245. Progression has improved. Findings include intraretinal fluid.   Notes OD with diffuse foveal atrophy, outer retinal atrophy counts for the acuity with addition of a prior macular hole.  No active CNVM  OS Perifoveal intraretinal fluid from CNVM still stable.       Intravitreal Injection, Pharmacologic Agent - OS - Left Eye       Time Out 05/23/2020. 2:41 PM. Confirmed correct patient, procedure, site, and patient consented.   Anesthesia Topical anesthesia was used. Anesthetic medications included Akten 3.5%.   Procedure Preparation included Ofloxacin , Tobramycin 0.3%, 10% betadine to eyelids, 5% betadine to ocular surface. A 30 gauge needle was used.   Injection:  1.25 mg Bevacizumab (AVASTIN) SOLN   NDC: [address]-4, Lot: [id number]   Route: Intravitreal, Site: Left Eye, Waste: 0 mg  Post-op Post injection exam found visual acuity of at least counting fingers. The patient tolerated the procedure well. There were no complications. The patient received written and verbal post procedure care education. Post injection medications were not given.                 ASSESSMENT/PLAN:  Advanced nonexudative age-related macular degeneration of both eyes with subfoveal involvement OD accounts for visual acuity  OS geographic atrophy is a encroaching near the fovea  Posterior vitreous detachment of left eye   The nature of posterior vitreous detachment was discussed with the patient as well as its physiology, its age prevalence, and its possible implication regarding retinal breaks and detachment.  An informational brochure was given to the patient.  All the patient's questions were answered.  The patient was asked to return if new or different flashes or floaters develops.   Patient was instructed to contact office  immediately if any changes were noticed. I explained to the patient that vitreous inside the eye is similar to jello inside a bowl. As the jello melts it can start to pull away from the bowl, similarly the vitreous throughout our lives can begin to pull away from the retina. That process is called a posterior vitreous detachment. In some cases, the vitreous can tug hard enough on the retina to form a retinal tear. I discussed with the patient the signs and symptoms of a retinal detachment.  Do not rub the eye.      ICD-10-CM   1. Exudative age-related macular degeneration of left eye with active choroidal neovascularization (HCC)  H35.3221 OCT, Retina - OU - Both Eyes    Intravitreal Injection, Pharmacologic Agent - OS - Left Eye    Bevacizumab (AVASTIN) SOLN 1.25 mg  2. Advanced nonexudative age-related macular degeneration of both eyes with subfoveal involvement  H35.3134   3. Posterior vitreous detachment of left eye  H43.812     1.  Overall left eye remains stable, repeat intravitreal Avastin OS today at 7-week interval  2.  OD, stable findings of dry ARMD  3.  Repeat examination left eye in 7 weeks  Ophthalmic Meds Ordered this visit:  Meds ordered this encounter  Medications  . Bevacizumab (AVASTIN) SOLN 1.25 mg       Return in about 7 weeks (around 07/11/2020) for dilate, OS, AVASTIN OCT.  Patient Instructions  Patient instructed to notify the office promptly if new visual acuity declines or changes develop    Explained the  diagnoses, plan, and follow up with the patient and they expressed understanding.  Patient expressed understanding of the importance of proper follow up care.   Clent Demark Perl Folmar M.D. Diseases & Surgery of the Retina and Vitreous [facility] [place] 05/23/20     Abbreviations: M myopia (nearsighted); A astigmatism; H hyperopia (farsighted); P presbyopia; Mrx spectacle prescription;  CTL contact lenses; OD right eye; OS left eye; OU both eyes   XT exotropia; ET esotropia; PEK punctate epithelial keratitis; PEE punctate epithelial erosions; DES dry eye syndrome; MGD meibomian gland dysfunction; ATs artificial tears; PFAT's preservative free artificial tears; [place] nuclear sclerotic cataract; PSC posterior subcapsular cataract; ERM epi-retinal membrane; PVD posterior vitreous detachment; RD retinal detachment; DM diabetes mellitus; DR diabetic retinopathy; NPDR non-proliferative diabetic retinopathy; PDR proliferative diabetic retinopathy; CSME clinically significant macular edema; DME diabetic macular edema; dbh dot blot hemorrhages; CWS cotton wool spot; POAG primary open angle glaucoma; C/D cup-to-disc ratio; HVF humphrey visual field; GVF goldmann visual field; OCT optical coherence tomography; IOP intraocular pressure; BRVO Branch retinal vein occlusion; CRVO central retinal vein occlusion; CRAO central retinal artery occlusion; BRAO branch retinal artery occlusion; RT retinal tear; SB scleral buckle; PPV pars plana vitrectomy; VH Vitreous hemorrhage; PRP panretinal laser photocoagulation; IVK intravitreal kenalog; VMT vitreomacular traction; MH Macular hole;  NVD neovascularization of the disc; NVE neovascularization elsewhere; AREDS age related eye disease study; ARMD age related macular degeneration; POAG primary open angle glaucoma; EBMD epithelial/anterior basement membrane dystrophy; ACIOL anterior chamber intraocular lens; IOL intraocular lens; PCIOL posterior chamber intraocular lens; Phaco/IOL phacoemulsification with intraocular lens placement; [place] photorefractive keratectomy; LASIK laser assisted in situ keratomileusis; HTN hypertension; DM diabetes mellitus; COPD chronic obstructive pulmonary disease

## 2020-05-23 NOTE — Assessment & Plan Note (Signed)
OD accounts for visual acuity  OS geographic atrophy is a encroaching near the fovea

## 2020-05-24 ENCOUNTER — Other Ambulatory Visit: Payer: Self-pay | Admitting: Internal Medicine

## 2020-05-24 MED ORDER — DULOXETINE HCL 30 MG PO CPEP
ORAL_CAPSULE | ORAL | 0 refills | Status: DC
Start: 2020-05-24 — End: 2020-08-04

## 2020-05-30 DIAGNOSIS — M5136 Other intervertebral disc degeneration, lumbar region: Secondary | ICD-10-CM | POA: Diagnosis not present

## 2020-06-08 ENCOUNTER — Other Ambulatory Visit: Payer: Self-pay | Admitting: Internal Medicine

## 2020-06-08 MED ORDER — NOVOLIN 70/30 (70-30) 100 UNIT/ML ~~LOC~~ SUSP: SUBCUTANEOUS | 3 refills | Status: DC

## 2020-06-26 ENCOUNTER — Other Ambulatory Visit: Payer: Self-pay | Admitting: Adult Health

## 2020-06-26 DIAGNOSIS — M5136 Other intervertebral disc degeneration, lumbar region: Secondary | ICD-10-CM | POA: Diagnosis not present

## 2020-06-26 DIAGNOSIS — M48061 Spinal stenosis, lumbar region without neurogenic claudication: Secondary | ICD-10-CM | POA: Diagnosis not present

## 2020-06-29 DIAGNOSIS — L820 Inflamed seborrheic keratosis: Secondary | ICD-10-CM | POA: Diagnosis not present

## 2020-06-29 DIAGNOSIS — D485 Neoplasm of uncertain behavior of skin: Secondary | ICD-10-CM | POA: Diagnosis not present

## 2020-06-29 DIAGNOSIS — L905 Scar conditions and fibrosis of skin: Secondary | ICD-10-CM | POA: Diagnosis not present

## 2020-06-29 DIAGNOSIS — L570 Actinic keratosis: Secondary | ICD-10-CM | POA: Diagnosis not present

## 2020-06-29 DIAGNOSIS — L821 Other seborrheic keratosis: Secondary | ICD-10-CM | POA: Diagnosis not present

## 2020-06-29 DIAGNOSIS — D1801 Hemangioma of skin and subcutaneous tissue: Secondary | ICD-10-CM | POA: Diagnosis not present

## 2020-06-29 DIAGNOSIS — C44722 Squamous cell carcinoma of skin of right lower limb, including hip: Secondary | ICD-10-CM | POA: Diagnosis not present

## 2020-07-11 ENCOUNTER — Ambulatory Visit (INDEPENDENT_AMBULATORY_CARE_PROVIDER_SITE_OTHER): Payer: Medicare Other | Admitting: Ophthalmology

## 2020-07-11 ENCOUNTER — Encounter (INDEPENDENT_AMBULATORY_CARE_PROVIDER_SITE_OTHER): Payer: Self-pay | Admitting: Ophthalmology

## 2020-07-11 ENCOUNTER — Other Ambulatory Visit: Payer: Self-pay

## 2020-07-11 DIAGNOSIS — H353134 Nonexudative age-related macular degeneration, bilateral, advanced atrophic with subfoveal involvement: Secondary | ICD-10-CM

## 2020-07-11 DIAGNOSIS — H353221 Exudative age-related macular degeneration, left eye, with active choroidal neovascularization: Secondary | ICD-10-CM

## 2020-07-11 DIAGNOSIS — H43812 Vitreous degeneration, left eye: Secondary | ICD-10-CM

## 2020-07-11 MED ORDER — BEVACIZUMAB CHEMO INJECTION 1.25MG/0.05ML SYRINGE FOR KALEIDOSCOPE
1.2500 mg | INTRAVITREAL | Status: AC | PRN
  Administered 2020-07-11: 1.25 mg via INTRAVITREAL

## 2020-07-11 NOTE — Progress Notes (Signed)
07/11/2020     CHIEF COMPLAINT Patient presents for Retina Follow Up   HISTORY OF PRESENT ILLNESS: Catherine Wood is a 86 y.o. female who presents to the clinic today for:   HPI    Retina Follow Up    Patient presents with  Wet AMD.  In left eye.  This started 7 weeks ago.  Severity is mild.  Duration of 7 weeks.          Comments    7 Week AMD F/U OS, poss Avastin OS  Pt denies noticeable changes to [place] OU since last visit. Pt denies ocular pain, flashes of light, or floaters OU.         Last edited by Rockie Neighbours, [place] on 07/11/2020  2:24 PM. (History)      Referring physician: [name], MD [address] [place],  [place] [address]  HISTORICAL INFORMATION:   Selected notes from the MEDICAL RECORD NUMBER    Lab Results  Component Value Date   HGBA1C 7.2 (H) 04/27/2020     CURRENT MEDICATIONS: No current outpatient medications on file. (Ophthalmic Drugs)   No current facility-administered medications for this visit. (Ophthalmic Drugs)   Current Outpatient Medications (Other)  Medication Sig  . ACCU-CHEK SOFTCLIX LANCETS lancets CHECK BLOOD SUGER UP TO 3 TIMES A DAY  . aspirin 81 MG tablet Take 81 mg by mouth daily.  . Cholecalciferol (VITAMIN D3) 5000 UNITS CAPS Take 5,000 Units by mouth daily.   . DULoxetine (CYMBALTA) 30 MG capsule Take    1 capsule      Daily for Mood  . fenofibrate 160 MG tablet Take 1 tablet Daily for Triglycerides (Blood Fats)  . furosemide (LASIX) 40 MG tablet Take   1 tablet      2 x /day      for Fluid Retention / Ankle Swelling  . insulin NPH-regular Human (NOVOLIN 70/30) (70-30) 100 UNIT/ML injection Inject 50 units into skin 2 x /day or as directed to control Diabetes  . Insulin Pen Needle (PEN NEEDLES 31GX5/16") 31G X 8 MM MISC USE 4 TIMES A DAY  . levothyroxine (SYNTHROID) 100 MCG tablet Take 1 tablet daily on an empty stomach with only water for 30 minutes & no Antacid meds, Calcium or Magnesium for 4  hours & avoid Biotin  . losartan (COZAAR) 100 MG tablet TAKE 1 TABLET ONCE A DAY FOR HIGH BLOOD PRESSURE  . Magnesium 400 MG CAPS Take 1 capsule by mouth 3 (three) times daily.  Marland Kitchen MEGARED OMEGA-3 KRILL OIL 500 MG CAPS Take 500 mg by mouth 2 (two) times daily.  . metFORMIN (GLUCOPHAGE) 1000 MG tablet Take 1 to 2 tablets 2 x /day with Meals for Diabetes  . Multiple Vitamins-Minerals (ICAPS PO) Take by mouth daily.  Marland Kitchen OVER THE COUNTER MEDICATION as needed. Macular Degeneration refresh eye drops (Patient not taking: Reported on 04/27/2020)  . rosuvastatin (CRESTOR) 5 MG tablet TAKE ONE TABLET AT BEDTIME   No current facility-administered medications for this visit. (Other)      REVIEW OF SYSTEMS:    ALLERGIES Allergies  Allergen Reactions  . Detrol [Tolterodine]     PAST MEDICAL HISTORY Past Medical History:  Diagnosis Date  . Arthritis   . GERD (gastroesophageal reflux disease)   . HOH (hard of hearing)   . Hyperlipidemia   . Hypertension   . Hypothyroid   . IDDM (insulin dependent diabetes mellitus)   . Leg swelling   . Macular  degeneration   . Shortness of breath dyspnea    with exertion  . Sleep apnea    does not wear CPAP  . Varicose veins   . Wears glasses    Past Surgical History:  Procedure Laterality Date  . ANTERIOR CERVICAL DECOMP/DISCECTOMY FUSION N/A 11/08/2014   Procedure: Cervical three-four,  Cervical six-seven anterior cervical decompression with fusion plating and bonegraft;  Surgeon: Ashok Pall, MD;  Location: [facility];  Service: Neurosurgery;  Laterality: N/A;  Cervical three-four,  Cervical six-seven anterior cervical decompression with fusion plating and bonegraft  . BIOPSY THYROID     benign thyroid nodule removal  . CATARACT EXTRACTION, BILATERAL    . COLONOSCOPY W/ BIOPSIES AND POLYPECTOMY    . ENDOMETRIAL BIOPSY  2000  . SPINE SURGERY Left  11/08/2014   Left ACDF   . TUBAL LIGATION    . VARICOSE VEIN SURGERY      FAMILY  HISTORY Family History  Problem Relation Age of Onset  . Cancer Mother        colon  . Cancer Father        GI  . Breast cancer Neg Hx     SOCIAL HISTORY Social History   Tobacco Use  . Smoking status: Never Smoker  . Smokeless tobacco: Never Used  Substance Use Topics  . Alcohol use: No  . Drug use: No         OPHTHALMIC EXAM:  Base Eye Exam    Visual Acuity (ETDRS)      Right Left   Dist cc CF @ 1' 20/60 +2   Dist ph cc NI 20/50 -2   Correction: Glasses       Tonometry (Tonopen, 2:24 PM)      Right Left   Pressure 11 12       Pupils      Dark Light Shape React APD   Right 4 4 Round Minimal None   Left 3 3 Round Minimal None       Visual Fields (Counting fingers)      Left Right    Full Full       Extraocular Movement      Right Left    Full Full       Neuro/Psych    Oriented x3: Yes   Mood/Affect: Normal       Dilation    Left eye: 1.0% Mydriacyl, 2.5% Phenylephrine @ 2:28 PM        Slit Lamp and Fundus Exam    External Exam      Right Left   External Normal Normal       Slit Lamp Exam      Right Left   Lids/Lashes Normal Normal   Conjunctiva/Sclera White and quiet White and quiet   Cornea Clear Clear   Anterior Chamber Deep and quiet Deep and quiet   Iris Round and reactive Round and reactive   Lens Posterior chamber intraocular lens    Anterior Vitreous Normal Normal       Fundus Exam      Right Left   Posterior Vitreous  Posterior vitreous detachment   Disc  Normal   C/D Ratio  0.3   Macula  Retinal pigment epithelial atrophy, Geographic atrophy, Advanced age related macular degeneration,Microaneurysms   Vessels  Normal   Periphery  Normal          IMAGING AND PROCEDURES  Imaging and Procedures for 07/11/20  OCT, Retina - OU - Both  Eyes       Right Eye Quality was good. Scan locations included subfoveal. Central Foveal Thickness: 216. Progression has been stable. Findings include outer retinal atrophy, central  retinal atrophy, inner retinal atrophy, abnormal foveal contour.   Left Eye Quality was good. Scan locations included subfoveal. Central Foveal Thickness: 257. Progression has been stable. Findings include central retinal atrophy, outer retinal atrophy, intraretinal fluid, epiretinal membrane, retinal drusen , abnormal foveal contour, pigment epithelial detachment.   Notes OD, no active CNVM  OS with much less intraretinal fluid, subfoveal atrophy, some drusenoid pigment epithelial detachments.  Stable OS at 7-week post injection Avastin       Intravitreal Injection, Pharmacologic Agent - OS - Left Eye       Time Out 07/11/2020. 3:43 PM. Confirmed correct patient, procedure, site, and patient consented.   Anesthesia Topical anesthesia was used. Anesthetic medications included Akten 3.5%.   Procedure Preparation included Ofloxacin , Tobramycin 0.3%, 10% betadine to eyelids, 5% betadine to ocular surface. A 30 gauge needle was used.   Injection:  1.25 mg Bevacizumab (AVASTIN) SOLN   NDC: 70360-001-02, Lot: [id number]   Route: Intravitreal, Site: Left Eye, Waste: 0 mg  Post-op Post injection exam found visual acuity of at least counting fingers. The patient tolerated the procedure well. There were no complications. The patient received written and verbal post procedure care education. Post injection medications were not given.                 ASSESSMENT/PLAN:  Posterior vitreous detachment of left eye   The nature of posterior vitreous detachment was discussed with the patient as well as its physiology, its age prevalence, and its possible implication regarding retinal breaks and detachment.  An informational brochure was given to the patient.  All the patient's questions were answered.  The patient was asked to return if new or different flashes or floaters develops.   Patient was instructed to contact office immediately if any changes were noticed. I explained to the patient  that vitreous inside the eye is similar to jello inside a bowl. As the jello melts it can start to pull away from the bowl, similarly the vitreous throughout our lives can begin to pull away from the retina. That process is called a posterior vitreous detachment. In some cases, the vitreous can tug hard enough on the retina to form a retinal tear. I discussed with the patient the signs and symptoms of a retinal detachment.  Do not rub the eye.  Advanced nonexudative age-related macular degeneration of both eyes with subfoveal involvement OS overall stable and improved at 7-week.  Much less active CNVM well controlled.  We will continue to monitor intraretinal fluid temporal to fovea.  Repeat intravitreal Avastin OS today      ICD-10-CM   1. Exudative age-related macular degeneration of left eye with active choroidal neovascularization (HCC)  H35.3221 OCT, Retina - OU - Both Eyes    Intravitreal Injection, Pharmacologic Agent - OS - Left Eye    Bevacizumab (AVASTIN) SOLN 1.25 mg  2. Posterior vitreous detachment of left eye  H43.812   3. Advanced nonexudative age-related macular degeneration of both eyes with subfoveal involvement  H35.3134 Intravitreal Injection, Pharmacologic Agent - OS - Left Eye    Bevacizumab (AVASTIN) SOLN 1.25 mg    1.  Repeat intravitreal Avastin OS today at 7-week.  We will maintain 7-week follow-up interval due to history of multiple recurrences.  2.  3.  Ophthalmic Meds Ordered this  visit:  Meds ordered this encounter  Medications  . Bevacizumab (AVASTIN) SOLN 1.25 mg       Return in about 7 weeks (around 08/29/2020) for dilate, OS, AVASTIN OCT.  Patient Instructions  Patient instructed to notify the office promptly new onset visual acuity decline or distortion    Explained the diagnoses, plan, and follow up with the patient and they expressed understanding.  Patient expressed understanding of the importance of proper follow up care.   Clent Demark Jaleigh Mccroskey  M.D. Diseases & Surgery of the Retina and Vitreous [facility]ina [facility] Diabetic [place] 07/11/20     Abbreviations: M myopia (nearsighted); A astigmatism; H hyperopia (farsighted); P presbyopia; Mrx spectacle prescription;  CTL contact lenses; OD right eye; OS left eye; OU both eyes  XT exotropia; ET esotropia; PEK punctate epithelial keratitis; PEE punctate epithelial erosions; DES dry eye syndrome; MGD meibomian gland dysfunction; ATs artificial tears; PFAT's preservative free artificial tears; [place] nuclear sclerotic cataract; PSC posterior subcapsular cataract; ERM epi-retinal membrane; PVD posterior vitreous detachment; RD retinal detachment; DM diabetes mellitus; DR diabetic retinopathy; NPDR non-proliferative diabetic retinopathy; PDR proliferative diabetic retinopathy; CSME clinically significant macular edema; DME diabetic macular edema; dbh dot blot hemorrhages; CWS cotton wool spot; POAG primary open angle glaucoma; C/D cup-to-disc ratio; HVF humphrey visual field; GVF goldmann visual field; OCT optical coherence tomography; IOP intraocular pressure; BRVO Branch retinal vein occlusion; CRVO central retinal vein occlusion; CRAO central retinal artery occlusion; BRAO branch retinal artery occlusion; RT retinal tear; SB scleral buckle; PPV pars plana vitrectomy; VH Vitreous hemorrhage; PRP panretinal laser photocoagulation; IVK intravitreal kenalog; VMT vitreomacular traction; MH Macular hole;  NVD neovascularization of the disc; NVE neovascularization elsewhere; AREDS age related eye disease study; ARMD age related macular degeneration; POAG primary open angle glaucoma; EBMD epithelial/anterior basement membrane dystrophy; ACIOL anterior chamber intraocular lens; IOL intraocular lens; PCIOL posterior chamber intraocular lens; Phaco/IOL phacoemulsification with intraocular lens placement; [place] photorefractive keratectomy; LASIK laser assisted in situ keratomileusis; HTN hypertension; DM diabetes mellitus; COPD  chronic obstructive pulmonary disease

## 2020-07-11 NOTE — Assessment & Plan Note (Signed)
OS overall stable and improved at 7-week.  Much less active CNVM well controlled.  We will continue to monitor intraretinal fluid temporal to fovea.  Repeat intravitreal Avastin OS today

## 2020-07-11 NOTE — Assessment & Plan Note (Signed)

## 2020-07-11 NOTE — Patient Instructions (Signed)
Patient instructed to notify the office promptly new onset visual acuity decline or distortion

## 2020-07-14 ENCOUNTER — Other Ambulatory Visit: Payer: Self-pay | Admitting: Adult Health

## 2020-07-18 DIAGNOSIS — D485 Neoplasm of uncertain behavior of skin: Secondary | ICD-10-CM | POA: Diagnosis not present

## 2020-07-18 DIAGNOSIS — D0471 Carcinoma in situ of skin of right lower limb, including hip: Secondary | ICD-10-CM | POA: Diagnosis not present

## 2020-07-18 DIAGNOSIS — L821 Other seborrheic keratosis: Secondary | ICD-10-CM | POA: Diagnosis not present

## 2020-08-01 ENCOUNTER — Other Ambulatory Visit: Payer: Self-pay | Admitting: Adult Health

## 2020-08-04 ENCOUNTER — Other Ambulatory Visit: Payer: Self-pay | Admitting: Internal Medicine

## 2020-08-06 ENCOUNTER — Encounter: Payer: Self-pay | Admitting: Internal Medicine

## 2020-08-06 NOTE — Patient Instructions (Signed)

## 2020-08-06 NOTE — Progress Notes (Signed)
Annual Screening/Preventative Visit & Comprehensive Evaluation &  Examination      This very nice 87 y.o.  WWF  presents for a Screening /Preventative Visit & comprehensive evaluation and management of multiple medical co-morbidities.  Patient has been followed for HTN, HLD, T2_IDDM , Vascular Dementia  and Vitamin D Deficiency.       HTN predates since 60. Patient's BP has been controlled at home and patient denies any cardiac symptoms as chest pain, palpitations, shortness of breath, dizziness or ankle swelling. Today's BP is at goal -  136/60.       Patient's hyperlipidemia is controlled with diet and medications. Patient denies myalgias or other medication SE's. Last lipids were at goal except elevated Trig's:  Lab Results  Component Value Date   CHOL 147 04/27/2020   HDL 44 (L) 04/27/2020   LDLCALC 70 04/27/2020   TRIG 238 (H) 04/27/2020   CHOLHDL 3.3 04/27/2020        Patient has hx/o T2_IDDM (2003) with CKD3b (GFR 34).  Patient has peripheral sensory Neuropathy and is also followed closely by Dr Zadie Rhine for diabetic retinopathy.    Patient is taking Novolin 70/30  Insulin bid pending bid CBG's checked by her son Konrad Dolores who lives next door and who also dispenses all of her meds & prepares all of her meals.  Patient denies reactive hypoglycemic symptoms, visual blurring, diabetic polys, but does have numb paresthesias of her feet /toes. Last A1c was not at goal:  Lab Results  Component Value Date   HGBA1C 7.2 (H) 04/27/2020       Patient had Goiter surgery in the 1970's and she has  been on Thyroid Replacement since.        Finally, patient has history of Vitamin D Deficiency and last Vitamin D was still low (goal 70-100):  Lab Results  Component Value Date   VD25OH 48 01/13/2020   Medication Sig  . DULoxetine (CYMBALTA) 30 MG capsule TAKE 1 CAPSULE  DAILY FOR MOOD  . NOVOLIN 70/30 Inject48 am & 38 pm  units into skin 2 x /day   . levothyroxine  100 MCG tablet  Take 1 tablet daily   . metFORMIN (GLUCOPHAGE) 1000 MG tablet Take      1 tablet      2 x /day       with Meals for Diabetes  . aspirin 81 MG tablet Take  daily.   Marland Kitchen VITAMIN D 5000 UNITS  Take 5,000 Units  daily.   . fenofibrate 160 MG tablet Take 1 tablet Daily   . furosemide  40 MG tablet Take   1 tablet      2 x /day  . losartan  100 MG tablet TAKE 1 TABLET ONCE A DAY   . Magnesium 400 MG CAPS Take 1 capsule 3  times daily.   . OMEGA-3 KRILL OIL 500 MG  Take 500 mg by mouth 2 (two) times daily.   . ICAPS Take by mouth daily  . rosuvastatin  5 MG tablet Take       1 tablet       Daily     Allergies  Allergen Reactions  . Detrol [Tolterodine]     Past Medical History:  Diagnosis Date  . Arthritis   . GERD (gastroesophageal reflux disease)   . HOH (hard of hearing)   . Hyperlipidemia   . Hypertension   . Hypothyroid   . IDDM (insulin dependent diabetes mellitus)   . Leg  swelling   . Macular degeneration   . Shortness of breath dyspnea    with exertion  . Sleep apnea    does not wear CPAP  . Varicose veins   . Wears glasses    Health Maintenance  Topic Date Due  . COVID-19 Vaccine (1) Never done  . INFLUENZA VACCINE  04/09/2020  . FOOT EXAM  06/30/2020  . HEMOGLOBIN A1C  10/28/2020  . OPHTHALMOLOGY EXAM  11/28/2020  . TETANUS/TDAP  10/20/2024  . DEXA SCAN  Completed  . PNA vac Low Risk Adult  Completed   Immunization History  Administered Date(s) Administered  . DT (Pediatric) 10/20/2014  . Influenza Split 06/04/2017  . Influenza, High Dose Seasonal PF 07/01/2016, 05/29/2018  . Influenza-Unspecified 06/22/2013, 06/16/2014, 06/27/2014, 07/06/2015, 06/09/2019  . Pneumococcal Conjugate-13 04/05/2014  . Pneumococcal-Unspecified 09/09/2004  . Td 09/10/2003  . Zoster 09/09/2005    Last Colon - 04/12/2015 - Dr Watt Climes - deferred f/u due to age  Last MGM - 08/03/2019  Past Surgical History:  Procedure Laterality Date  . ANTERIOR CERVICAL DECOMP/DISCECTOMY FUSION  N/A 11/08/2014   Procedure: Cervical three-four,  Cervical six-seven anterior cervical decompression with fusion plating and bonegraft;  Surgeon: Ashok Pall, MD;  Location: [facility];  Service: Neurosurgery;  Laterality: N/A;  Cervical three-four,  Cervical six-seven anterior cervical decompression with fusion plating and bonegraft  . BIOPSY THYROID     benign thyroid nodule removal  . CATARACT EXTRACTION, BILATERAL    . COLONOSCOPY W/ BIOPSIES AND POLYPECTOMY    . ENDOMETRIAL BIOPSY  2000  . SPINE SURGERY Left  11/08/2014   Left ACDF   . TUBAL LIGATION    . VARICOSE VEIN SURGERY     Family History  Problem Relation Age of Onset  . Cancer Mother        colon  . Cancer Father        GI  . Breast cancer Neg Hx    Social History   Tobacco Use  . Smoking status: Never Smoker  . Smokeless tobacco: Never Used  Substance Use Topics  . Alcohol use: No  . Drug use: No    ROS Constitutional: Denies fever, chills, weight loss/gain, headaches, insomnia,  night sweats, and change in appetite. Does c/o fatigue. Eyes: Denies redness, blurred vision, diplopia, discharge, itchy, watery eyes.  ENT: Denies discharge, congestion, post nasal drip, epistaxis, sore throat, earache, hearing loss, dental pain, Tinnitus, Vertigo, Sinus pain, snoring.  Cardio: Denies chest pain, palpitations, irregular heartbeat, syncope, dyspnea, diaphoresis, orthopnea, PND, claudication, edema Respiratory: denies cough, dyspnea, DOE, pleurisy, hoarseness, laryngitis, wheezing.  Gastrointestinal: Denies dysphagia, heartburn, reflux, water brash, pain, cramps, nausea, vomiting, bloating, diarrhea, constipation, hematemesis, melena, hematochezia, jaundice, hemorrhoids Genitourinary: Denies dysuria, frequency, urgency, nocturia, hesitancy, discharge, hematuria, flank pain Breast: Breast lumps, nipple discharge, bleeding.  Musculoskeletal: Denies arthralgia, myalgia, stiffness, Jt. Swelling, pain, limp, and strain/sprain.  Denies falls. Skin: Denies puritis, rash, hives, warts, acne, eczema, changing in skin lesion Neuro: No weakness, tremor, incoordination, spasms, paresthesia, pain Psychiatric: Denies confusion, memory loss, sensory loss. Denies Depression. Endocrine: Denies change in weight, skin, hair change, nocturia, and paresthesia, diabetic polys, visual blurring, hyper / hypo glycemic episodes.  Heme/Lymph: No excessive bleeding, bruising, enlarged lymph nodes.  Physical Exam  BP 136/60   Pulse 80   Temp 97.7 F (36.5 C)   Ht 5' 2.5" (1.588 m)   Wt 196 lb (88.9 kg)   SpO2 96%   BMI 35.28 kg/m   General Appearance: Well  nourished, well groomed and in no apparent distress.  Eyes: PERRLA, EOMs, conjunctiva no swelling or erythema and  fundi  Not well visualized with scattered pigmentation and blot & blot H & E. Sinuses: No frontal/maxillary tenderness ENT/Mouth: EACs patent / TMs  nl. Nares clear without erythema, swelling, mucoid exudates. Oral hygiene is good. No erythema, swelling, or exudate. Tongue normal, non-obstructing. Tonsils not swollen or erythematous. Hearing normal.  Neck: Supple, thyroid not palpable. No bruits, nodes or JVD. Respiratory: Respiratory effort normal.  BS equal and clear bilateral without rales, rhonci, wheezing or stridor. Cardio: Heart sounds are normal with regular rate and rhythm and no murmurs, rubs or gallops. Peripheral pulses are normal and equal bilaterally without edema. No aortic or femoral bruits. Chest: symmetric with normal excursions and percussion. Breasts: Symmetric, without lumps, nipple discharge, retractions, or fibrocystic changes.  Abdomen: Flat, soft with bowel sounds active. Nontender, no guarding, rebound, hernias, masses, or organomegaly.  Lymphatics: Non tender without lymphadenopathy.  Musculoskeletal: Full ROM all peripheral extremities, joint stability, 5/5 strength, and normal gait. Skin: Warm and dry without rashes, lesions, cyanosis,  clubbing or  ecchymosis.  Neuro: Cranial nerves intact, reflexes equal bilaterally. Normal muscle tone, no cerebellar symptoms. Sensation sl decreased in a stocking distribution  to touch, vibratory and Monofilament to the toes bilaterally. Pysch: Alert and oriented x 1-2, pleasant  Affect. Short term recall is poor - doesn't know president , nor year , month or day - knows was just Thanksgiving & Christmas is soon. Doesn't know her age, what city we're in.  Insight and Judgment very limited   Assessment and Plan  1. Encounter for general adult medical examination with abnormal findings   2. Essential hypertension  - EKG 12-Lead - Urinalysis, Routine w reflex microscopic - Microalbumin / creatinine urine ratio - CBC with Differential/Platelet - COMPLETE METABOLIC PANEL WITH GFR - Magnesium - TSH  3. Hyperlipidemia associated with type 2 diabetes mellitus ([place])  - EKG 12-Lead - Lipid panel - TSH  4. Type 2 diabetes mellitus with stage 3b chronic kidney  disease, with long-term current use of insulin (HCC)  - EKG 12-Lead - Urinalysis, Routine w reflex microscopic - Microalbumin / creatinine urine ratio - PTH, intact and calcium - Hemoglobin A1c  5. Vitamin D deficiency  - VITAMIN D 25 Hydroxy   6. Diabetic polyneuropathy associated with type 2 diabetes mellitus (HCC)  - HM DIABETES FOOT EXAM - LOW EXTREMITY NEUR EXAM DOCUM  7. Hypothyroidism  - TSH  8. Screening for colorectal cancer  - POC Hemoccult Bld/Stl  9. Screening for ischemic heart disease  - EKG 12-Lead  10. Medication management  - Urinalysis, Routine w reflex microscopic - Microalbumin / creatinine urine ratio - CBC with Differential/Platelet - COMPLETE METABOLIC PANEL WITH GFR - Magnesium - Lipid panel - TSH - Hemoglobin A1c - VITAMIN D 25 Hydroxy        Patient was counseled in prudent diet to achieve/maintain BMI less than 25 for weight control, BP monitoring, regular exercise and  medications. Discussed med's effects and SE's. Screening labs and tests as requested with regular follow-up as recommended. Over 40 minutes of exam, counseling, chart review and high complex critical decision making was performed.   Kirtland Bouchard, MD

## 2020-08-07 ENCOUNTER — Other Ambulatory Visit: Payer: Self-pay | Admitting: Internal Medicine

## 2020-08-07 ENCOUNTER — Other Ambulatory Visit: Payer: Self-pay

## 2020-08-07 ENCOUNTER — Ambulatory Visit (INDEPENDENT_AMBULATORY_CARE_PROVIDER_SITE_OTHER): Payer: Medicare Other | Admitting: Internal Medicine

## 2020-08-07 ENCOUNTER — Encounter: Payer: Self-pay | Admitting: Internal Medicine

## 2020-08-07 ENCOUNTER — Other Ambulatory Visit: Payer: Self-pay | Admitting: Adult Health

## 2020-08-07 VITALS — BP 136/60 | HR 80 | Temp 97.7°F | Ht 62.5 in | Wt 196.0 lb

## 2020-08-07 DIAGNOSIS — I10 Essential (primary) hypertension: Secondary | ICD-10-CM

## 2020-08-07 DIAGNOSIS — N1832 Chronic kidney disease, stage 3b: Secondary | ICD-10-CM

## 2020-08-07 DIAGNOSIS — Z1211 Encounter for screening for malignant neoplasm of colon: Secondary | ICD-10-CM

## 2020-08-07 DIAGNOSIS — E039 Hypothyroidism, unspecified: Secondary | ICD-10-CM

## 2020-08-07 DIAGNOSIS — E1169 Type 2 diabetes mellitus with other specified complication: Secondary | ICD-10-CM

## 2020-08-07 DIAGNOSIS — Z0000 Encounter for general adult medical examination without abnormal findings: Secondary | ICD-10-CM

## 2020-08-07 DIAGNOSIS — Z136 Encounter for screening for cardiovascular disorders: Secondary | ICD-10-CM | POA: Diagnosis not present

## 2020-08-07 DIAGNOSIS — E1142 Type 2 diabetes mellitus with diabetic polyneuropathy: Secondary | ICD-10-CM

## 2020-08-07 DIAGNOSIS — Z794 Long term (current) use of insulin: Secondary | ICD-10-CM

## 2020-08-07 DIAGNOSIS — E559 Vitamin D deficiency, unspecified: Secondary | ICD-10-CM

## 2020-08-07 DIAGNOSIS — Z79899 Other long term (current) drug therapy: Secondary | ICD-10-CM

## 2020-08-07 DIAGNOSIS — Z0001 Encounter for general adult medical examination with abnormal findings: Secondary | ICD-10-CM

## 2020-08-07 DIAGNOSIS — E1122 Type 2 diabetes mellitus with diabetic chronic kidney disease: Secondary | ICD-10-CM | POA: Diagnosis not present

## 2020-08-07 DIAGNOSIS — Z1231 Encounter for screening mammogram for malignant neoplasm of breast: Secondary | ICD-10-CM

## 2020-08-07 DIAGNOSIS — E785 Hyperlipidemia, unspecified: Secondary | ICD-10-CM

## 2020-08-07 MED ORDER — ASPIRIN 81 MG PO TBEC: DELAYED_RELEASE_TABLET | ORAL | 0 refills | Status: DC

## 2020-08-07 MED ORDER — ICAPS PO CAPS: ORAL_CAPSULE | ORAL | Status: DC

## 2020-08-07 MED ORDER — VITAMIN D3 125 MCG (5000 UT) PO CAPS: ORAL_CAPSULE | ORAL | Status: DC

## 2020-08-07 MED ORDER — LOSARTAN POTASSIUM 100 MG PO TABS: ORAL_TABLET | ORAL | 0 refills | Status: DC

## 2020-08-07 MED ORDER — ROSUVASTATIN CALCIUM 5 MG PO TABS: ORAL_TABLET | ORAL | 0 refills | Status: DC

## 2020-08-07 MED ORDER — MAGNESIUM 400 MG PO CAPS: ORAL_CAPSULE | ORAL | Status: DC

## 2020-08-07 MED ORDER — MEGARED OMEGA-3 KRILL OIL 500 MG PO CAPS: ORAL_CAPSULE | ORAL | Status: DC

## 2020-08-07 MED ORDER — FUROSEMIDE 40 MG PO TABS: ORAL_TABLET | ORAL | 0 refills | Status: DC

## 2020-08-08 ENCOUNTER — Other Ambulatory Visit: Payer: Self-pay | Admitting: Internal Medicine

## 2020-08-08 DIAGNOSIS — N3000 Acute cystitis without hematuria: Secondary | ICD-10-CM

## 2020-08-08 DIAGNOSIS — Z794 Long term (current) use of insulin: Secondary | ICD-10-CM

## 2020-08-08 DIAGNOSIS — Z79899 Other long term (current) drug therapy: Secondary | ICD-10-CM

## 2020-08-08 DIAGNOSIS — E1122 Type 2 diabetes mellitus with diabetic chronic kidney disease: Secondary | ICD-10-CM

## 2020-08-08 LAB — URINALYSIS, ROUTINE W REFLEX MICROSCOPIC
Bacteria, UA: NONE SEEN /HPF
Bilirubin Urine: NEGATIVE
Glucose, UA: NEGATIVE
Hgb urine dipstick: NEGATIVE
Hyaline Cast: NONE SEEN /LPF
Ketones, ur: NEGATIVE
Nitrite: POSITIVE — AB
Protein, ur: NEGATIVE
RBC / HPF: NONE SEEN /HPF (ref 0–2)
Specific Gravity, Urine: 1.01 (ref 1.001–1.03)
pH: 7.5 (ref 5.0–8.0)

## 2020-08-08 LAB — TSH: TSH: 2.05 mIU/L (ref 0.40–4.50)

## 2020-08-08 LAB — COMPLETE METABOLIC PANEL WITH GFR
AG Ratio: 1.7 (calc) (ref 1.0–2.5)
ALT: 19 U/L (ref 6–29)
AST: 28 U/L (ref 10–35)
Albumin: 4.4 g/dL (ref 3.6–5.1)
Alkaline phosphatase (APISO): 28 U/L — ABNORMAL LOW (ref 37–153)
BUN/Creatinine Ratio: 34 (calc) — ABNORMAL HIGH (ref 6–22)
BUN: 44 mg/dL — ABNORMAL HIGH (ref 7–25)
CO2: 34 mmol/L — ABNORMAL HIGH (ref 20–32)
Calcium: 11 mg/dL — ABNORMAL HIGH (ref 8.6–10.4)
Chloride: 96 mmol/L — ABNORMAL LOW (ref 98–110)
Creat: 1.29 mg/dL — ABNORMAL HIGH (ref 0.60–0.88)
GFR, Est African American: 43 mL/min/{1.73_m2} — ABNORMAL LOW (ref >=60)
GFR, Est Non African American: 37 mL/min/{1.73_m2} — ABNORMAL LOW (ref >=60)
Globulin: 2.6 g/dL (calc) (ref 1.9–3.7)
Glucose, Bld: 44 mg/dL — ABNORMAL LOW (ref 65–99)
Potassium: 4.7 mmol/L (ref 3.5–5.3)
Sodium: 142 mmol/L (ref 135–146)
Total Bilirubin: 0.3 mg/dL (ref 0.2–1.2)
Total Protein: 7 g/dL (ref 6.1–8.1)

## 2020-08-08 LAB — LIPID PANEL
Cholesterol: 146 mg/dL (ref <200)
HDL: 40 mg/dL — ABNORMAL LOW (ref >=50)
LDL Cholesterol (Calc): 72 mg/dL (calc)
Non-HDL Cholesterol (Calc): 106 mg/dL (calc) (ref <130)
Total CHOL/HDL Ratio: 3.7 (calc) (ref <5.0)
Triglycerides: 257 mg/dL — ABNORMAL HIGH (ref <150)

## 2020-08-08 LAB — CBC WITH DIFFERENTIAL/PLATELET
Absolute Monocytes: 1386 cells/uL — ABNORMAL HIGH (ref 200–950)
Basophils Absolute: 76 cells/uL (ref 0–200)
Basophils Relative: 0.6 %
Eosinophils Absolute: 655 cells/uL — ABNORMAL HIGH (ref 15–500)
Eosinophils Relative: 5.2 %
HCT: 39.3 % (ref 35.0–45.0)
Hemoglobin: 12.7 g/dL (ref 11.7–15.5)
Lymphs Abs: 2936 cells/uL (ref 850–3900)
MCH: 28.9 pg (ref 27.0–33.0)
MCHC: 32.3 g/dL (ref 32.0–36.0)
MCV: 89.3 fL (ref 80.0–100.0)
MPV: 12.2 fL (ref 7.5–12.5)
Monocytes Relative: 11 %
Neutro Abs: 7547 cells/uL (ref 1500–7800)
Neutrophils Relative %: 59.9 %
Platelets: 457 10*3/uL — ABNORMAL HIGH (ref 140–400)
RBC: 4.4 10*6/uL (ref 3.80–5.10)
RDW: 12.4 % (ref 11.0–15.0)
Total Lymphocyte: 23.3 %
WBC: 12.6 10*3/uL — ABNORMAL HIGH (ref 3.8–10.8)

## 2020-08-08 LAB — VITAMIN D 25 HYDROXY (VIT D DEFICIENCY, FRACTURES): Vit D, 25-Hydroxy: 82 ng/mL (ref 30–100)

## 2020-08-08 LAB — MICROALBUMIN / CREATININE URINE RATIO
Creatinine, Urine: 67 mg/dL (ref 20–275)
Microalb Creat Ratio: 18 mcg/mg creat (ref <30)
Microalb, Ur: 1.2 mg/dL

## 2020-08-08 LAB — PTH, INTACT AND CALCIUM
Calcium: 11 mg/dL — ABNORMAL HIGH (ref 8.6–10.4)
PTH: 5 pg/mL — ABNORMAL LOW (ref 14–64)

## 2020-08-08 LAB — HEMOGLOBIN A1C
Hgb A1c MFr Bld: 5.8 % of total Hgb — ABNORMAL HIGH (ref <5.7)
Mean Plasma Glucose: 120 (calc)
eAG (mmol/L): 6.6 (calc)

## 2020-08-08 LAB — MAGNESIUM: Magnesium: 1.6 mg/dL (ref 1.5–2.5)

## 2020-08-08 NOTE — Progress Notes (Signed)
========================================================== ==========================================================  -    U/A suspicious for Infection   - will send for culture & result may not be back til Thurs or Fri ==========================================================  -  Glucose was low at 44 when blood was drawn. So please continue monitoring blood sugars 2 x /day  ==========================================================  -  Kidney functions a little worse  - Not drinking enough water will cause this   - Very IMPORTANT to drink at least 6 bottles of liquids or water /day  ==========================================================  -  Calcium is elevated - So stop Vitamin D and do not take   any calcium supplements    ( this may be due to worsening kidney function)  . . . . . . . . . . . . . . . . . . . . . . . . . . . . . . . . . . . . . . . . . . . . . . . . . . . . . . . . . . . . .   - Schedule a Nurse visit for labs in 2 weeks to recheck labs  ==========================================================  -   Magnesium  -     -  very  low- goal is betw 2.0 - 2.5  - So..............Marland Kitchen  Recommend that you take  Magnesium 500 mg tablet daily   - also important to eat lots of  leafy green vegetables   - spinach - Kale - collards - greens - okra - asparagus  - broccoli - quinoa - squash - almonds   - black, red, white beans  -  peas - green beans ==========================================================  -  Total Chol = 146 -  Excellent   - Very low risk for Heart Attack  / Stroke ========================================================  - But Triglycerides (  257   ) or fats in blood are too high  (goal is less than 150)    - Recommend avoid fried & greasy foods,  sweets / candy,   - Avoid white rice  (brown or wild rice or Quinoa is OK),   - Avoid white potatoes  (sweet potatoes are OK)   - Avoid anything made from white flour  - bagels,  doughnuts, rolls, buns, biscuits, white and   wheat breads, pizza crust and traditional  pasta made of white flour & egg white  - (vegetarian pasta or spinach or wheat pasta is OK).    - Multi-grain bread is OK - like multi-grain flat bread or  sandwich thins.   - Avoid alcohol in excess.   - Exercise is also important. ==========================================================  -  A1c is much better - down from 8.0 % and 7.2% top Now 5.8%   - Essentially  Normal - Excellent   ==========================================================  -  Vitamin D = 82 - Great  ==========================================================  - All Else - CBC - Kidneys - Electrolytes - Liver - Magnesium & Thyroid    - all  Normal / OK ==========================================================

## 2020-08-10 ENCOUNTER — Other Ambulatory Visit: Payer: Self-pay | Admitting: Internal Medicine

## 2020-08-10 DIAGNOSIS — N3000 Acute cystitis without hematuria: Secondary | ICD-10-CM

## 2020-08-10 LAB — URINE CULTURE
MICRO NUMBER:: 11258713
SPECIMEN QUALITY:: ADEQUATE

## 2020-08-10 MED ORDER — CIPROFLOXACIN HCL 250 MG PO TABS: ORAL_TABLET | ORAL | 0 refills | Status: DC

## 2020-08-10 NOTE — Progress Notes (Signed)
==========================================================  -   U/C shows an infection & Rx for Abx (Cipro 2 x /day x 10 days)  sent to [facility]  - Please schedule a NV in about 3 to 4 weeks to recheck urine  ( Between Dec 27   - Jan 7)  ==========================================================

## 2020-08-21 ENCOUNTER — Ambulatory Visit: Payer: Medicare Other

## 2020-08-21 ENCOUNTER — Other Ambulatory Visit: Payer: Self-pay

## 2020-08-21 DIAGNOSIS — Z79899 Other long term (current) drug therapy: Secondary | ICD-10-CM

## 2020-08-21 DIAGNOSIS — N3000 Acute cystitis without hematuria: Secondary | ICD-10-CM

## 2020-08-21 DIAGNOSIS — E1122 Type 2 diabetes mellitus with diabetic chronic kidney disease: Secondary | ICD-10-CM

## 2020-08-21 DIAGNOSIS — N1832 Chronic kidney disease, stage 3b: Secondary | ICD-10-CM | POA: Diagnosis not present

## 2020-08-21 NOTE — Progress Notes (Signed)
Patient reports for lab work. At the time of check in patient & son were unsure as to why bld work was needed. So they could not tell me what if any changes were made or addressed. I informed both parties that we would call with results.

## 2020-08-22 NOTE — Progress Notes (Signed)
========================================================== ==========================================================  -    Kidney Functions still in Stage 3b & Stable ==========================================================  -  calcium level is better - back in Normal Range ==========================================================  -  Urine culture - still pending ==========================================================

## 2020-08-24 ENCOUNTER — Other Ambulatory Visit: Payer: Self-pay | Admitting: Internal Medicine

## 2020-08-24 DIAGNOSIS — N3000 Acute cystitis without hematuria: Secondary | ICD-10-CM

## 2020-08-24 LAB — COMPLETE METABOLIC PANEL WITH GFR
AG Ratio: 1.7 (calc) (ref 1.0–2.5)
ALT: 25 U/L (ref 6–29)
AST: 31 U/L (ref 10–35)
Albumin: 4 g/dL (ref 3.6–5.1)
Alkaline phosphatase (APISO): 26 U/L — ABNORMAL LOW (ref 37–153)
BUN/Creatinine Ratio: 31 (calc) — ABNORMAL HIGH (ref 6–22)
BUN: 36 mg/dL — ABNORMAL HIGH (ref 7–25)
CO2: 35 mmol/L — ABNORMAL HIGH (ref 20–32)
Calcium: 10.3 mg/dL (ref 8.6–10.4)
Chloride: 97 mmol/L — ABNORMAL LOW (ref 98–110)
Creat: 1.18 mg/dL — ABNORMAL HIGH (ref 0.60–0.88)
GFR, Est African American: 48 mL/min/{1.73_m2} — ABNORMAL LOW (ref >=60)
GFR, Est Non African American: 41 mL/min/{1.73_m2} — ABNORMAL LOW (ref >=60)
Globulin: 2.4 g/dL (calc) (ref 1.9–3.7)
Glucose, Bld: 87 mg/dL (ref 65–99)
Potassium: 5 mmol/L (ref 3.5–5.3)
Sodium: 141 mmol/L (ref 135–146)
Total Bilirubin: 0.3 mg/dL (ref 0.2–1.2)
Total Protein: 6.4 g/dL (ref 6.1–8.1)

## 2020-08-24 LAB — URINALYSIS, ROUTINE W REFLEX MICROSCOPIC
Bacteria, UA: NONE SEEN /HPF
Bilirubin Urine: NEGATIVE
Glucose, UA: NEGATIVE
Hgb urine dipstick: NEGATIVE
Hyaline Cast: NONE SEEN /LPF
Ketones, ur: NEGATIVE
Nitrite: NEGATIVE
Protein, ur: NEGATIVE
RBC / HPF: NONE SEEN /HPF (ref 0–2)
Specific Gravity, Urine: 1.01 (ref 1.001–1.03)
Squamous Epithelial / HPF: NONE SEEN /HPF (ref <=5)
pH: 7.5 (ref 5.0–8.0)

## 2020-08-24 LAB — URINE CULTURE
MICRO NUMBER:: 11310312
SPECIMEN QUALITY:: ADEQUATE

## 2020-08-24 LAB — PTH, INTACT AND CALCIUM
Calcium: 10.3 mg/dL (ref 8.6–10.4)
PTH: 8 pg/mL — ABNORMAL LOW (ref 14–64)

## 2020-08-24 NOTE — Progress Notes (Signed)
==========================================================  -    Urine culture finally returned an shows a bacteria very sensitive to the   Cipro that you're taking, so please finish and . . . . Marland Kitchen   recommend schedule a nurse visit in about 3 weeks to  recheck urine to assure infection is cleared up. ==========================================================

## 2020-08-29 ENCOUNTER — Ambulatory Visit (INDEPENDENT_AMBULATORY_CARE_PROVIDER_SITE_OTHER): Payer: Medicare Other | Admitting: Ophthalmology

## 2020-08-29 ENCOUNTER — Encounter (INDEPENDENT_AMBULATORY_CARE_PROVIDER_SITE_OTHER): Payer: Self-pay | Admitting: Ophthalmology

## 2020-08-29 ENCOUNTER — Other Ambulatory Visit: Payer: Self-pay

## 2020-08-29 DIAGNOSIS — H353221 Exudative age-related macular degeneration, left eye, with active choroidal neovascularization: Secondary | ICD-10-CM | POA: Diagnosis not present

## 2020-08-29 DIAGNOSIS — H353134 Nonexudative age-related macular degeneration, bilateral, advanced atrophic with subfoveal involvement: Secondary | ICD-10-CM

## 2020-08-29 MED ORDER — BEVACIZUMAB 2.5 MG/0.1ML IZ SOSY
2.5000 mg | PREFILLED_SYRINGE | INTRAVITREAL | Status: AC | PRN
  Administered 2020-08-29: 15:00:00 2.5 mg via INTRAVITREAL

## 2020-08-29 NOTE — Assessment & Plan Note (Signed)
Accounts for acuity, and should OS progress would potentially account for decline in acuity OS

## 2020-08-29 NOTE — Assessment & Plan Note (Addendum)
Perifoveal CME  signifying active CNVM,OS With subfoveal atrophy, perifoveal CME, large drusen, active CNVM and intraretinal fluid at 7-week interval today

## 2020-08-29 NOTE — Progress Notes (Signed)
08/29/2020     CHIEF COMPLAINT Patient presents for Retina Follow Up (7 Week AMD F/U OS, poss Avastin OS//Pt denies noticeable changes to [place] OU since last visit. Pt denies ocular pain, flashes of light, or floaters OU. //)   HISTORY OF PRESENT ILLNESS: Catherine Wood is a 86 y.o. female who presents to the clinic today for:   HPI    Retina Follow Up    Patient presents with  Wet AMD.  In left eye.  This started 7 weeks ago.  Severity is mild.  Duration of 7 weeks.  Since onset it is stable. Additional comments: 7 Week AMD F/U OS, poss Avastin OS  Pt denies noticeable changes to [place] OU since last visit. Pt denies ocular pain, flashes of light, or floaters OU.          Last edited by Rockie Neighbours, [place] on 08/29/2020  1:57 PM. (History)      Referring physician: [name], MD [address] [place],  [place] [address]  HISTORICAL INFORMATION:   Selected notes from the MEDICAL RECORD NUMBER    Lab Results  Component Value Date   HGBA1C 5.8 (H) 08/07/2020     CURRENT MEDICATIONS: No current outpatient medications on file. (Ophthalmic Drugs)   No current facility-administered medications for this visit. (Ophthalmic Drugs)   Current Outpatient Medications (Other)  Medication Sig  . ACCU-CHEK SOFTCLIX LANCETS lancets CHECK BLOOD SUGER UP TO 3 TIMES A DAY  . aspirin (SB LOW DOSE ASA EC) 81 MG EC tablet Take      1 tablet        Daily  . Cholecalciferol (VITAMIN D3) 125 MCG (5000 UT) CAPS Take      1 capsule     2 x /day  . ciprofloxacin (CIPRO) 250 MG tablet Take      1 tablet       2 x /day       with Food      for Infection  . DULoxetine (CYMBALTA) 30 MG capsule TAKE 1 CAPSULE ONCE DAILY FOR MOOD  . furosemide (LASIX) 40 MG tablet Take   1 tablet    1 or  2 x /day      for Fluid Retention / Ankle Swelling  . insulin NPH-regular Human (NOVOLIN 70/30) (70-30) 100 UNIT/ML injection Inject 50 units into skin 2 x /day or as directed to control Diabetes   . Insulin Pen Needle (PEN NEEDLES 31GX5/16") 31G X 8 MM MISC USE 4 TIMES A DAY  . levothyroxine (SYNTHROID) 100 MCG tablet Take 1 tablet daily on an empty stomach with only water for 30 minutes & no Antacid meds, Calcium or Magnesium for 4 hours & avoid Biotin  . losartan (COZAAR) 100 MG tablet Take     1 tablet     Daily       for BP  . Magnesium 400 MG CAPS Take      1 capsule       Daily  . MegaRed Omega-3 Krill Oil 500 MG CAPS Take      1 capsule      2 x /day         for Triglycerides  (Blood Fats)  . metFORMIN (GLUCOPHAGE) 1000 MG tablet Take      1 tablet      2 x /day       with Meals for Diabetes  . Multiple Vitamins-Minerals (ICAPS) CAPS Takle  1 tablet     Daily for Eyes  . rosuvastatin (CRESTOR) 5 MG tablet Take       1 tablet       Daily        for Cholesterol   No current facility-administered medications for this visit. (Other)      REVIEW OF SYSTEMS:    ALLERGIES Allergies  Allergen Reactions  . Detrol [Tolterodine]     PAST MEDICAL HISTORY Past Medical History:  Diagnosis Date  . Arthritis   . GERD (gastroesophageal reflux disease)   . HOH (hard of hearing)   . Hyperlipidemia   . Hypertension   . Hypothyroid   . IDDM (insulin dependent diabetes mellitus)   . Leg swelling   . Macular degeneration   . Shortness of breath dyspnea    with exertion  . Sleep apnea    does not wear CPAP  . Varicose veins   . Wears glasses    Past Surgical History:  Procedure Laterality Date  . ANTERIOR CERVICAL DECOMP/DISCECTOMY FUSION N/A 11/08/2014   Procedure: Cervical three-four,  Cervical six-seven anterior cervical decompression with fusion plating and bonegraft;  Surgeon: Ashok Pall, MD;  Location: [place] [facility];  Service: Neurosurgery;  Laterality: N/A;  Cervical three-four,  Cervical six-seven anterior cervical decompression with fusion plating and bonegraft  . BIOPSY THYROID     benign thyroid nodule removal  . CATARACT EXTRACTION, BILATERAL    . COLONOSCOPY  W/ BIOPSIES AND POLYPECTOMY    . ENDOMETRIAL BIOPSY  2000  . SPINE SURGERY Left  11/08/2014   Left ACDF   . TUBAL LIGATION    . VARICOSE VEIN SURGERY      FAMILY HISTORY Family History  Problem Relation Age of Onset  . Cancer Mother        colon  . Cancer Father        GI  . Breast cancer Neg Hx     SOCIAL HISTORY Social History   Tobacco Use  . Smoking status: Never Smoker  . Smokeless tobacco: Never Used  Substance Use Topics  . Alcohol use: No  . Drug use: No         OPHTHALMIC EXAM:  Base Eye Exam    Visual Acuity (ETDRS)      Right Left   Dist cc CF @ 1' 20/50 -2   Dist ph cc NI 20/50 +2   Correction: Glasses       Tonometry (Tonopen, 1:58 PM)      Right Left   Pressure 17 19       Pupils      Dark Light Shape React APD   Right 5 5 Round Minimal None   Left 4 4 Round Minimal None       Visual Fields (Counting fingers)      Left Right    Full Full       Extraocular Movement      Right Left    Full Full       Neuro/Psych    Oriented x3: Yes   Mood/Affect: Normal       Dilation    Left eye: 1.0% Mydriacyl, 2.5% Phenylephrine @ 2:01 PM        Slit Lamp and Fundus Exam    External Exam      Right Left   External Normal Normal       Slit Lamp Exam      Right Left   Lids/Lashes Normal Normal  Conjunctiva/Sclera White and quiet White and quiet   Cornea Clear Clear   Anterior Chamber Deep and quiet Deep and quiet   Iris Round and reactive Round and reactive   Lens Posterior chamber intraocular lens Posterior chamber intraocular lens   Anterior Vitreous Normal Normal       Fundus Exam      Right Left   Posterior Vitreous  Posterior vitreous detachment   Disc  Normal   C/D Ratio  0.3   Macula  Retinal pigment epithelial atrophy, Geographic atrophy, Advanced age related macular degeneration,Microaneurysms   Vessels  Normal   Periphery  Normal          IMAGING AND PROCEDURES  Imaging and Procedures for 08/29/20  OCT,  Retina - OU - Both Eyes       Right Eye Central Foveal Thickness: 222. Progression has been stable. Findings include subretinal scarring, disciform scar.   Left Eye Quality was good. Scan locations included subfoveal. Central Foveal Thickness: 270. Progression has worsened. Findings include cystoid macular edema.   Notes Subretinal scarring, disciform scar OD, no interval change.  OS With subfoveal atrophy, perifoveal CME, large drusen, active CNVM and intraretinal fluid at 7-week interval today       Intravitreal Injection, Pharmacologic Agent - OS - Left Eye       Time Out 08/29/2020. 2:55 PM. Confirmed correct patient, procedure, site, and patient consented.   Anesthesia Topical anesthesia was used. Anesthetic medications included [place] 3.5%.   Procedure Preparation included Ofloxacin , Tobramycin 0.3%, 10% betadine to eyelids, 5% betadine to ocular surface. A 30 gauge needle was used.   Injection:  2.5 mg Bevacizumab (AVASTIN) 2.5mg /0.38mL SOSY   NDC: 78295-621-30, Lot: [id number]   Route: Intravitreal, Site: Left Eye  Post-op Post injection exam found visual acuity of at least counting fingers. The patient tolerated the procedure well. There were no complications. The patient received written and verbal post procedure care education. Post injection medications were not given.                 ASSESSMENT/PLAN:  Advanced nonexudative age-related macular degeneration of both eyes with subfoveal involvement Accounts for acuity, and should OS progress would potentially account for decline in acuity OS  Exudative age-related macular degeneration of left eye with active choroidal neovascularization (HCC) Perifoveal CME  signifying active CNVM,OS With subfoveal atrophy, perifoveal CME, large drusen, active CNVM and intraretinal fluid at 7-week interval today      ICD-10-CM   1. Exudative age-related macular degeneration of left eye with active choroidal  neovascularization (HCC)  H35.3221 OCT, Retina - OU - Both Eyes    Intravitreal Injection, Pharmacologic Agent - OS - Left Eye    bevacizumab (AVASTIN) SOSY 2.5 mg  2. Advanced nonexudative age-related macular degeneration of both eyes with subfoveal involvement  H35.3134     1. Macular RPE choriocapillaris atrophy OU accounts for acuity, yet intraretinal fluid and CME recurs  Temporal macula OS at longer follow-up intervals post intravitreal Avastin. We will thus repeat injection today at 7-week interval and follow-up again in 6 weeks  2.  3.  Ophthalmic Meds Ordered this visit:  Meds ordered this encounter  Medications  . bevacizumab (AVASTIN) SOSY 2.5 mg       Return in about 6 weeks (around 10/10/2020) for dilate, OS, AVASTIN OCT.  There are no Patient Instructions on file for this visit.   Explained the diagnoses, plan, and follow up with the patient and they expressed understanding.  Patient expressed understanding of the importance of proper follow up care.   Clent Demark Kalev Temme M.D. Diseases & Surgery of the Retina and Vitreous Retina & Diabetic [place] 08/29/20     Abbreviations: M myopia (nearsighted); A astigmatism; H hyperopia (farsighted); P presbyopia; Mrx spectacle prescription;  CTL contact lenses; OD right eye; OS left eye; OU both eyes  XT exotropia; ET esotropia; PEK punctate epithelial keratitis; PEE punctate epithelial erosions; DES dry eye syndrome; MGD meibomian gland dysfunction; ATs artificial tears; PFAT's preservative free artificial tears; [place] nuclear sclerotic cataract; PSC posterior subcapsular cataract; ERM epi-retinal membrane; PVD posterior vitreous detachment; RD retinal detachment; DM diabetes mellitus; DR diabetic retinopathy; NPDR non-proliferative diabetic retinopathy; PDR proliferative diabetic retinopathy; CSME clinically significant macular edema; DME diabetic macular edema; dbh dot blot hemorrhages; CWS cotton wool spot; POAG primary open  angle glaucoma; C/D cup-to-disc ratio; HVF humphrey visual field; GVF goldmann visual field; OCT optical coherence tomography; IOP intraocular pressure; BRVO Branch retinal vein occlusion; CRVO central retinal vein occlusion; CRAO central retinal artery occlusion; BRAO branch retinal artery occlusion; RT retinal tear; SB scleral buckle; PPV pars plana vitrectomy; VH Vitreous hemorrhage; PRP panretinal laser photocoagulation; IVK intravitreal kenalog; VMT vitreomacular traction; MH Macular hole;  NVD neovascularization of the disc; NVE neovascularization elsewhere; AREDS age related eye disease study; ARMD age related macular degeneration; POAG primary open angle glaucoma; EBMD epithelial/anterior basement membrane dystrophy; ACIOL anterior chamber intraocular lens; IOL intraocular lens; PCIOL posterior chamber intraocular lens; Phaco/IOL phacoemulsification with intraocular lens placement; [place] photorefractive keratectomy; LASIK laser assisted in situ keratomileusis; HTN hypertension; DM diabetes mellitus; COPD chronic obstructive pulmonary disease

## 2020-09-13 DIAGNOSIS — Z20822 Contact with and (suspected) exposure to covid-19: Secondary | ICD-10-CM | POA: Diagnosis not present

## 2020-09-18 ENCOUNTER — Other Ambulatory Visit: Payer: Self-pay

## 2020-09-18 ENCOUNTER — Ambulatory Visit (INDEPENDENT_AMBULATORY_CARE_PROVIDER_SITE_OTHER): Payer: Medicare Other

## 2020-09-18 DIAGNOSIS — N3000 Acute cystitis without hematuria: Secondary | ICD-10-CM | POA: Diagnosis not present

## 2020-09-18 NOTE — Progress Notes (Signed)
Patient reports for recheck of urine. Vitals entered into epic. Lab order was entered into EPIC by provider. And meds taken as directed by provider.

## 2020-09-19 ENCOUNTER — Ambulatory Visit
Admission: RE | Admit: 2020-09-19 | Discharge: 2020-09-19 | Disposition: A | Discharge status: Home / Self Care | Payer: Medicare Other | Source: Ambulatory Visit | Attending: Internal Medicine | Admitting: Internal Medicine

## 2020-09-19 ENCOUNTER — Ambulatory Visit: Payer: Medicare Other

## 2020-09-19 DIAGNOSIS — Z1231 Encounter for screening mammogram for malignant neoplasm of breast: Secondary | ICD-10-CM | POA: Diagnosis not present

## 2020-09-19 LAB — URINALYSIS, ROUTINE W REFLEX MICROSCOPIC
Bacteria, UA: NONE SEEN /HPF
Bilirubin Urine: NEGATIVE
Glucose, UA: NEGATIVE
Hgb urine dipstick: NEGATIVE
Hyaline Cast: NONE SEEN /LPF
Ketones, ur: NEGATIVE
Nitrite: NEGATIVE
Protein, ur: NEGATIVE
RBC / HPF: NONE SEEN /HPF (ref 0–2)
Specific Gravity, Urine: 1.01 (ref 1.001–1.03)
Squamous Epithelial / HPF: NONE SEEN /HPF (ref <=5)
WBC, UA: NONE SEEN /HPF (ref 0–5)
pH: 7 (ref 5.0–8.0)

## 2020-09-19 LAB — URINE CULTURE
MICRO NUMBER:: 11400055
SPECIMEN QUALITY:: ADEQUATE

## 2020-09-20 NOTE — Progress Notes (Signed)
==========================================================  -    Repeat U/A - OK and Culture shows NO INFECTION now, So . . . . .   -  No Antibiotic needed  ==========================================================  -

## 2020-09-21 ENCOUNTER — Other Ambulatory Visit: Payer: Self-pay | Admitting: Internal Medicine

## 2020-09-21 DIAGNOSIS — N3000 Acute cystitis without hematuria: Secondary | ICD-10-CM

## 2020-10-02 ENCOUNTER — Other Ambulatory Visit: Payer: Self-pay | Admitting: Internal Medicine

## 2020-10-10 ENCOUNTER — Encounter (INDEPENDENT_AMBULATORY_CARE_PROVIDER_SITE_OTHER): Payer: Medicare Other | Admitting: Ophthalmology

## 2020-10-16 ENCOUNTER — Other Ambulatory Visit: Payer: Self-pay | Admitting: Adult Health

## 2020-10-16 DIAGNOSIS — Z79899 Other long term (current) drug therapy: Secondary | ICD-10-CM

## 2020-10-16 DIAGNOSIS — E785 Hyperlipidemia, unspecified: Secondary | ICD-10-CM

## 2020-10-16 DIAGNOSIS — I10 Essential (primary) hypertension: Secondary | ICD-10-CM

## 2020-10-16 DIAGNOSIS — E1169 Type 2 diabetes mellitus with other specified complication: Secondary | ICD-10-CM

## 2020-10-16 NOTE — Progress Notes (Addendum)
History of Present Illness:     [name] with HTN & Insulin Dependent DM  presents with cough & chest congestion & recent Covid exposure 1-2 weeks ago. Testing today was (+) for Covid. Patient's O2 sat was 93  Her son Catherine Wood was transporting her. He was advised to purchase a pulse Oxy & monitor & if O2 sat's dropped below 90% , to take his mother to the hospital ER. He relates she has a persistent dry hacking nonproductive cough. No reported fever, chills, sweats, dyspnea or rash. He reports that she says she feels fine except for the cough.   Medications  Current Outpatient Medications (Endocrine & Metabolic):    levothyroxine (SYNTHROID) 100 MCG tablet, TAKE (1) TABLET DAILY BE- FORE BREAKFAST.   metFORMIN (GLUCOPHAGE) 500 MG tablet, TAKE (1) TABLET TWICE A DAY WITH MEALS (BREAKFAST AND SUPPER)   NOVOLIN 70/30 RELION (70-30) 100 UNIT/ML injection, INJECT 50 UNITS SUBCUTANEOUSLY TWICE DAILY BEFORE MEAL(S) AS DIRECTED    furosemide (LASIX) 40 MG tablet, TAKE 1 OR 2 TABLETS DAILY AS NEEDED FOR SWELLING/EDEMA   losartan (COZAAR) 100 MG tablet, Take     1 tablet     Daily       for BP   rosuvastatin (CRESTOR) 5 MG tablet, TAKE ONE TABLET AT BEDTIME    Current Outpatient Medications (Analgesics):    aspirin (SB LOW DOSE ASA EC) 81 MG EC tablet, Take      1 tablet        Daily   Current Outpatient Medications (Other):    ACCU-CHEK SOFTCLIX LANCETS lancets, CHECK BLOOD SUGER UP TO 3 TIMES A DAY   Cholecalciferol (VITAMIN D3) 125 MCG (5000 UT) CAPS, Take      1 capsule     2 x /day   ciprofloxacin (CIPRO) 250 MG tablet, Take      1 tablet       2 x /day       with Food      for Infection   DULoxetine (CYMBALTA) 30 MG capsule, TAKE 1 CAPSULE ONCE DAILY FOR MOOD   Insulin Pen Needle (PEN NEEDLES 31GX5/16") 31G X 8 MM MISC, USE 4 TIMES A DAY   Magnesium 400 MG CAPS, Take      1 capsule       Daily   MegaRed Omega-3 Krill Oil 500 MG CAPS, Take      1 capsule      2 x /day          for Triglycerides  (Blood Fats)   Multiple Vitamins-Minerals (ICAPS) CAPS, Takle     1 tablet     Daily for Eyes  Problem list She has Essential hypertension; Hypothyroidism; CKD stage 3 due to type 2 diabetes mellitus ([place]); Hyperlipidemia associated with type 2 diabetes mellitus ([place]); Macular degeneration; Vitamin D deficiency; Chronic venous insufficiency; Obesity (BMI 30.0-34.9); DDD (degenerative disc disease), lumbar; Cervical spondylosis with myelopathy; Diabetic polyneuropathy associated with type 2 diabetes mellitus ([place]); Type 2 diabetes mellitus with stage 3b chronic kidney disease, with long-term current use of insulin ([place]); Vascular dementia without behavioral disturbance ([place]); Deterioration in renal function; Exudative age-related macular degeneration of left eye with active choroidal neovascularization ([place]); Retinal hemorrhage of left eye; Lamellar macular hole of right eye; Dry eyes; Advanced nonexudative age-related macular degeneration of both eyes with subfoveal involvement; and Posterior vitreous detachment of left eye on their problem list.   Observations/Objective:  BP 134/68   Pulse  72   Temp (!) 97.3 F (36.3 C)   SpO2 93%   HEENT - WNL. Neck - supple.  Chest - Clear equal BS. Cor - Nl HS. RRR w/o sig MGR. PP 1(+). No edema. MS- FROM w/o deformities.  Gait Nl. Neuro -  Nl w/o focal abnormalities.  Assessment and Plan:  1. Lower respiratory tract infection due to COVID-19 virus  - POC COVID-19  - discussed symptomatic treatments with son   2. Encounter for screening for COVID-19  - POC COVID-19 - ->>  Negative   Follow Up Instructions:       I discussed the assessment and treatment plan with the patient. The patient was provided an opportunity to ask questions and all were answered. The patient agreed with the plan and demonstrated an understanding of the instructions.      The patient was advised to call back or seek an in-person evaluation if the  symptoms worsen or if the condition fails to improve as anticipated.    Kirtland Bouchard, MD

## 2020-10-17 ENCOUNTER — Ambulatory Visit (INDEPENDENT_AMBULATORY_CARE_PROVIDER_SITE_OTHER): Payer: Medicare Other | Admitting: Internal Medicine

## 2020-10-17 ENCOUNTER — Encounter: Payer: Self-pay | Admitting: Internal Medicine

## 2020-10-17 ENCOUNTER — Ambulatory Visit: Payer: Medicare Other | Admitting: Adult Health

## 2020-10-17 ENCOUNTER — Other Ambulatory Visit: Payer: Self-pay

## 2020-10-17 VITALS — BP 134/68 | HR 72 | Temp 97.3°F

## 2020-10-17 DIAGNOSIS — Z1152 Encounter for screening for COVID-19: Secondary | ICD-10-CM | POA: Diagnosis not present

## 2020-10-17 DIAGNOSIS — U071 COVID-19: Secondary | ICD-10-CM

## 2020-10-17 DIAGNOSIS — J22 Unspecified acute lower respiratory infection: Secondary | ICD-10-CM

## 2020-10-17 LAB — POC COVID19 BINAXNOW: SARS Coronavirus 2 Ag: POSITIVE — AB

## 2020-10-17 MED ORDER — AZITHROMYCIN 250 MG PO TABS: ORAL_TABLET | ORAL | 1 refills | Status: DC

## 2020-10-17 MED ORDER — BENZONATATE 200 MG PO CAPS: ORAL_CAPSULE | ORAL | 1 refills | Status: DC

## 2020-10-17 MED ORDER — DEXAMETHASONE 4 MG PO TABS: ORAL_TABLET | ORAL | 0 refills | Status: DC

## 2020-10-17 MED ORDER — PROMETHAZINE-DM 6.25-15 MG/5ML PO SYRP: ORAL_SOLUTION | ORAL | 1 refills | Status: DC

## 2020-11-06 DIAGNOSIS — M8589 Other specified disorders of bone density and structure, multiple sites: Secondary | ICD-10-CM | POA: Insufficient documentation

## 2020-11-06 NOTE — Progress Notes (Deleted)
MEDICARE ANNUAL WELLNESS VISIT AND FOLLOW UP Assessment:   Catherine Wood was seen today for medicare wellness.  Diagnoses and all orders for this visit:  Medicare annual wellness visit Yearly  Essential hypertension Continue medications; typically at goal  Monitor blood pressure; call if consistently over 130/80 Continue DASH diet.   Reminder to go to the ER if any CP, SOB, nausea, dizziness, severe HA, changes vision/speech, left arm numbness and tingling and jaw pain.  Hyperlipidemia associated with T2DM (HCC) Continue medications LDL goal <70  Continue low cholesterol diet and exercise.  Check lipid panel.  -     pravastatin (PRAVACHOL) 40 MG tablet; Take 1 tablet (40 mg total) by mouth daily. for cholesterol.  Take at bedtime.  CKD stage 3 due to type 2 diabetes mellitus (HCC) Increase fluids, avoid NSAIDS, monitor sugars, will monitor  Hypothyroidism, unspecified type Taking levothyroxine 120mcg Education provided about when to take medicine. Take 30-86min first thing in am with water prior to food or other medications. Discussed with patient and daughter. Will check labs next office visit  Diabetic polyneuropathy associated with type 2 diabetes mellitus ([place]) Taking gabapentin & Duloxetine 30mg  Discussed checking feet daily and well fitting shoes Follows with podiatry   DDD (degenerative disc disease), lumbar Managing at this time Not taking any medications for this  Type 2 diabetes mellitus with stage 3 chronic kidney disease, with long-term current use of insulin (HCC) Patient taking 70/30 insulin 50 units AM, 40 units PM, son titrates as needed +/- 5 units Metformin 1000mg  twice a day- if GFR remains <45 will reduce dose Discussed dietary and exercise modifications Discussed general issues about diabetes pathophysiology and management., Educational material distributed., Suggested low cholesterol diet., Encouraged aerobic exercise., Discussed foot care., Reminded to  get yearly retinal exam.  Morbid obesity ([place]) Discussed dietary and exercise modifications, initial weight goal <200 lb  Macular degeneration, unspecified laterality, unspecified type Catherine Wood & Catherine [facility] for this, continue close follow up as recommended ***  Vitamin D deficiency Continue supplementation Will check labs net office visit   Medication management continued  Vascular dementia without behavioral disturbance [facility]) Son assists; declines neuro referral  No needs at this time; son/daughter are monitoring closely  Control blood pressure, cholesterol, glucose, increase exercise.  ***    I discussed the assessment and treatment plan with the patient. The patient was provided an opportunity to ask questions and all were answered. The patient agreed with the plan and demonstrated an understanding of the instructions.   The patient was advised to call back or seek an in-person evaluation if the symptoms worsen or if the condition fails to improve as anticipated.  I provided 30 minutes of non-face-to-face time during this encounter including counseling, chart review, and critical decision making was preformed.   Future Appointments  Date Time Provider [place]  11/08/2020  2:30 PM Liane Comber, NP [facility] None  02/13/2021  2:30 PM [name], MD [facility] None  08/20/2021  3:00 PM [name], MD [facility] None      Plan:   During the course of the visit the patient was educated and counseled about appropriate screening and preventive services including:    Pneumococcal vaccine   Influenza vaccine  Prevnar 13  Td vaccine  Screening electrocardiogram, deferred, telephone visit [place].  Colorectal cancer screening  Diabetes screening  Glaucoma screening  Nutrition counseling    Subjective:  Catherine Wood is a 87 y.o. female who presents for Medicare Annual Wellness Visit and  3 month follow up for HTN, hyperlipidemia, DMII,  polyneuropathy, hypothyroidism and vitamin D Def.   She has poor short term memory, MRI from 10/2014 showed atrophy and small vessel disease. Lab workup was normal, declined neuro referral. Concern with med adherence in previous years,  is accompanied by her son, who has been taking care of medications, lives next to her and assists with medications 3 times a day.   She has neuropathy and takes cymbalta 30 mg daily, gabapentin 300 mg 1 cap AM, 2 caps in afternoon, 2 caps PM  Lab Results  Component Value Date   VITAMINB12 >2000 (H) 01/03/2017     She is following closely with Catherine. Zadie Wood for exudative macular degeneration of L eye for avastin injections q6w.    BMI is There is no height or weight on file to calculate BMI., she has not been working on diet and exercise, admits she likes to eat but willing to work on portions and start walking a bit.  Wt Readings from Last 3 Encounters:  09/18/20 196 lb 9.6 oz (89.2 kg)  08/21/20 196 lb (88.9 kg)  08/07/20 196 lb (88.9 kg)   Her blood pressure has been controlled at home, today their BP is    She does not workout. She denies chest pain, shortness of breath, dizziness.  She is on cholesterol medication (rosuvastatin 5 mg daily, switched from pravastatin ***) and denies myalgias. Her cholesterol is not at goal. The cholesterol last visit was:   Lab Results  Component Value Date   CHOL 146 08/07/2020   HDL 40 (L) 08/07/2020   LDLCALC 72 08/07/2020   TRIG 257 (H) 08/07/2020   CHOLHDL 3.7 08/07/2020   She has been working on diet and exercise for DMII with CKD IIIb, neuropathy Novolin 70/30 50 units AM and 40 units in the PM, son will titrate +/- 5 units if needed Recent fasting average 130-140 per son  She is on ASA, Statin, ARB Denies hyperglycemia, hypoglycemia , increased appetite, nausea, paresthesia of the feet, polydipsia, polyuria, visual disturbances and vomiting.  Last A1C in the office was:  Lab Results  Component Value Date    HGBA1C 5.8 (H) 08/07/2020   She has CKD III,  associated with T2DM and htn, ***. Last GFR:  Lab Results  Component Value Date   GFRNONAA 41 (L) 08/21/2020   GFRNONAA 37 (L) 08/07/2020   GFRNONAA 43 (L) 04/27/2020   Patient is on Vitamin D supplement, taking 5000 IU daily    Lab Results  Component Value Date   VD25OH 82 08/07/2020     She is on thyroid medication. Her medication was not changed last visit.  She is taking 153mcg daily. Lab Results  Component Value Date   TSH 2.05 08/07/2020  .  Medication Review:  Current Outpatient Medications (Endocrine & Metabolic):  .  dexamethasone (DECADRON) 4 MG tablet, Take 1 tab 3 x /day for 2 days,      then 2 x /day for 2  Days,     then 1 tab daily .  levothyroxine (SYNTHROID) 100 MCG tablet, TAKE (1) TABLET DAILY BE- FORE BREAKFAST. .  metFORMIN (GLUCOPHAGE) 500 MG tablet, TAKE (1) TABLET TWICE A DAY WITH MEALS (BREAKFAST AND SUPPER) .  NOVOLIN 70/30 RELION (70-30) 100 UNIT/ML injection, INJECT 50 UNITS SUBCUTANEOUSLY TWICE DAILY BEFORE MEAL(S) AS DIRECTED  Current Outpatient Medications (Cardiovascular):  .  furosemide (LASIX) 40 MG tablet, TAKE 1 OR 2 TABLETS DAILY AS NEEDED FOR SWELLING/EDEMA .  losartan (COZAAR) 100 MG tablet, Take     1 tablet     Daily       for BP .  rosuvastatin (CRESTOR) 5 MG tablet, TAKE ONE TABLET AT BEDTIME  Current Outpatient Medications (Respiratory):  .  benzonatate (TESSALON) 200 MG capsule, Take 1 perle 3 x / day to prevent cough .  promethazine-dextromethorphan (PROMETHAZINE-DM) 6.25-15 MG/5ML syrup, Take 1 tsp every 4 hours if needed for cough  Current Outpatient Medications (Analgesics):  .  aspirin (SB LOW DOSE ASA EC) 81 MG EC tablet, Take      1 tablet        Daily   Current Outpatient Medications (Other):  Marland Kitchen  ACCU-CHEK SOFTCLIX LANCETS lancets, CHECK BLOOD SUGER UP TO 3 TIMES A DAY .  azithromycin (ZITHROMAX) 250 MG tablet, Take 2 tablets with Food on  Day 1, then 1 tablet Daily with  Food for Sinusitis / Bronchitis .  Cholecalciferol (VITAMIN D3) 125 MCG (5000 UT) CAPS, Take      1 capsule     2 x /day .  ciprofloxacin (CIPRO) 250 MG tablet, Take      1 tablet       2 x /day       with Food      for Infection .  DULoxetine (CYMBALTA) 30 MG capsule, TAKE 1 CAPSULE ONCE DAILY FOR MOOD .  Insulin Pen Needle (PEN NEEDLES 31GX5/16") 31G X 8 MM MISC, USE 4 TIMES A DAY .  Magnesium 400 MG CAPS, Take      1 capsule       Daily .  MegaRed Omega-3 Krill Oil 500 MG CAPS, Take      1 capsule      2 x /day         for Triglycerides  (Blood Fats) .  Multiple Vitamins-Minerals (ICAPS) CAPS, Takle     1 tablet     Daily for Eyes  Allergies: Allergies  Allergen Reactions  . Detrol [Tolterodine]     Current Problems (verified) has Essential hypertension; Hypothyroidism; CKD stage 3 due to type 2 diabetes mellitus ([place]); Hyperlipidemia associated with type 2 diabetes mellitus ([place]); Macular degeneration; Vitamin D deficiency; Chronic venous insufficiency; Obesity (BMI 30.0-34.9); DDD (degenerative disc disease), lumbar; Cervical spondylosis with myelopathy; Diabetic polyneuropathy associated with type 2 diabetes mellitus ([place]); Type 2 diabetes mellitus with stage 3b chronic kidney disease, with long-term current use of insulin ([place]); Vascular dementia without behavioral disturbance ([place]); Deterioration in renal function; Exudative age-related macular degeneration of left eye with active choroidal neovascularization ([place]); Retinal hemorrhage of left eye; Lamellar macular hole of right eye; Dry eyes; Advanced nonexudative age-related macular degeneration of both eyes with subfoveal involvement; and Posterior vitreous detachment of left eye on their problem list.  Screening Tests Immunization History  Administered Date(s) Administered  . DT (Pediatric) 10/20/2014  . Influenza Split 06/04/2017  . Influenza, High Dose Seasonal PF 07/01/2016, 05/29/2018  . Influenza,inj,quad, With Preservative  05/11/2017  . Influenza-Unspecified 06/22/2013, 06/16/2014, 06/27/2014, 07/06/2015, 06/09/2019  . Pneumococcal Conjugate-13 04/05/2014  . Pneumococcal-Unspecified 09/09/2004  . Td 09/10/2003  . Zoster 09/09/2005   ***  Preventative care: Last colonoscopy: 2016 Mammogram: 09/19/2020 DEXA: 2013 L fem T -1.4 ordered last year never scheduled ***  Prior vaccinations: TD or Tdap: 2016  Influenza: 2020 Pneumococcal: 2006 Prevnar13: 2015 Shingles/Zostavax: 2007  Names of Other Physician/Practitioners you currently use: 1. [facility] here for primary care 2. Catherine Wood, last eye  exam 08/29/2020, avastin injections, following q6-7 week 3. Catherine Lynnette Caffey, 2020.  Patient Care Team: [name], MD as PCP - General (Internal Medicine) Clarene Essex, MD as Consulting Physician (Gastroenterology) Stanford Breed Denice Bors, MD as Consulting Physician (Cardiology) Monna Fam, MD as Consulting Physician (Ophthalmology) Zadie Wood Clent Demark, MD as Consulting Physician (Ophthalmology) Deneise Lever, MD as Consulting Physician (Pulmonary Disease)  Surgical: She  has a past surgical history that includes Biopsy thyroid; Varicose vein surgery; Cataract extraction, bilateral; Endometrial biopsy (2000); Colonoscopy w/ biopsies and polypectomy; Tubal ligation; Anterior cervical decomp/discectomy fusion (N/A, 11/08/2014); and Spine surgery (Left,  11/08/2014). Family Her family history includes Cancer in her father and mother. Social history  She reports that she has never smoked. She has never used smokeless tobacco. She reports that she does not drink alcohol and does not use drugs.  MEDICARE WELLNESS OBJECTIVES: Physical activity:   Cardiac risk factors:   Depression/mood screen:   Depression screen [facility] 2/9 08/06/2020  Decreased Interest 0  Down, Depressed, Hopeless 0  PHQ - 2 Score 0    ADLs:  In your present state of health, do you have any difficulty  performing the following activities: 08/06/2020 01/12/2020  Hearing? N N  Vision? N N  Difficulty concentrating or making decisions? Y N  Comment mild dementia -  Walking or climbing stairs? N N  Dressing or bathing? N N  Doing errands, shopping? N N  Some recent data might be hidden     Cognitive Testing  Alert? Yes  Normal Appearance?Yes  Oriented to person? Yes  Place? Yes   Time? Yes  Recall of three objects?  2/3  Can perform simple calculations? Yes  Displays appropriate judgment?Yes  Can read the correct time from a watch face?Yes  EOL planning:     Objective:   There were no vitals filed for this visit. There is no height or weight on file to calculate BMI.  General Appearance: Well nourished, in no apparent distress. Eyes: PERRLA, EOMs, conjunctiva no swelling or erythema Sinuses: No Frontal/maxillary tenderness ENT/Mouth: Ext aud canals clear, TMs without erythema, bulging. No erythema, swelling, or exudate on post pharynx.  Tonsils not swollen or erythematous. Hearing normal.  Neck: Supple, thyroid normal.  Respiratory: Respiratory effort normal, BS equal bilaterally without rales, rhonchi, wheezing or stridor.  Cardio: RRR with no MRGs. Brisk peripheral pulses without edema.  Abdomen: Soft, + BS.  Non tender, no guarding, rebound, hernias, masses. Lymphatics: Non tender without lymphadenopathy.  Musculoskeletal: Full ROM, 5/5 strength, slow steady gait with cane Skin: Warm, dry without rashes, lesions, ecchymosis.  Neuro: Cranial nerves intact. No cerebellar symptoms.  Psych: Awake and oriented X 3, normal affect, Insight and Judgment fair  Medicare Attestation I have personally reviewed: The patient's medical and social history Their use of alcohol, tobacco or illicit drugs Their current medications and supplements The patient's functional ability including ADLs,fall risks, home safety risks, cognitive, and hearing and visual impairment Diet and physical  activities Evidence for depression or mood disorders  The patient's weight, height, BMI, and visual acuity have been recorded in the chart.  I have made referrals, counseling, and provided education to the patient based on review of the above and I have provided the patient with a written personalized care plan for preventive services.     Izora Ribas, NP 5:58 PM [facility]

## 2020-11-08 ENCOUNTER — Ambulatory Visit: Payer: Medicare Other | Admitting: Adult Health

## 2020-11-08 DIAGNOSIS — E039 Hypothyroidism, unspecified: Secondary | ICD-10-CM

## 2020-11-08 DIAGNOSIS — Z0000 Encounter for general adult medical examination without abnormal findings: Secondary | ICD-10-CM

## 2020-11-08 DIAGNOSIS — E559 Vitamin D deficiency, unspecified: Secondary | ICD-10-CM

## 2020-11-08 DIAGNOSIS — E1169 Type 2 diabetes mellitus with other specified complication: Secondary | ICD-10-CM

## 2020-11-08 DIAGNOSIS — E1122 Type 2 diabetes mellitus with diabetic chronic kidney disease: Secondary | ICD-10-CM

## 2020-11-08 DIAGNOSIS — I10 Essential (primary) hypertension: Secondary | ICD-10-CM

## 2020-11-08 DIAGNOSIS — H353221 Exudative age-related macular degeneration, left eye, with active choroidal neovascularization: Secondary | ICD-10-CM

## 2020-11-08 DIAGNOSIS — M8589 Other specified disorders of bone density and structure, multiple sites: Secondary | ICD-10-CM

## 2020-11-08 DIAGNOSIS — E669 Obesity, unspecified: Secondary | ICD-10-CM

## 2020-11-08 DIAGNOSIS — F0150 Vascular dementia without behavioral disturbance: Secondary | ICD-10-CM

## 2020-11-08 DIAGNOSIS — E1142 Type 2 diabetes mellitus with diabetic polyneuropathy: Secondary | ICD-10-CM

## 2020-11-08 DIAGNOSIS — I872 Venous insufficiency (chronic) (peripheral): Secondary | ICD-10-CM

## 2020-11-08 DIAGNOSIS — H3530 Unspecified macular degeneration: Secondary | ICD-10-CM

## 2020-11-15 ENCOUNTER — Other Ambulatory Visit: Payer: Self-pay | Admitting: Adult Health

## 2020-11-15 DIAGNOSIS — I10 Essential (primary) hypertension: Secondary | ICD-10-CM

## 2020-11-15 DIAGNOSIS — Z79899 Other long term (current) drug therapy: Secondary | ICD-10-CM

## 2020-11-22 ENCOUNTER — Other Ambulatory Visit: Payer: Self-pay | Admitting: Adult Health

## 2020-11-22 DIAGNOSIS — Z794 Long term (current) use of insulin: Secondary | ICD-10-CM

## 2020-11-22 MED ORDER — NOVOLIN 70/30 RELION (70-30) 100 UNIT/ML ~~LOC~~ SUSP: SUBCUTANEOUS | 2 refills | Status: DC

## 2020-12-28 DIAGNOSIS — C44321 Squamous cell carcinoma of skin of nose: Secondary | ICD-10-CM | POA: Diagnosis not present

## 2020-12-28 DIAGNOSIS — C44329 Squamous cell carcinoma of skin of other parts of face: Secondary | ICD-10-CM | POA: Diagnosis not present

## 2020-12-28 DIAGNOSIS — Z85828 Personal history of other malignant neoplasm of skin: Secondary | ICD-10-CM | POA: Diagnosis not present

## 2020-12-28 DIAGNOSIS — L905 Scar conditions and fibrosis of skin: Secondary | ICD-10-CM | POA: Diagnosis not present

## 2020-12-28 DIAGNOSIS — L820 Inflamed seborrheic keratosis: Secondary | ICD-10-CM | POA: Diagnosis not present

## 2020-12-28 DIAGNOSIS — C44319 Basal cell carcinoma of skin of other parts of face: Secondary | ICD-10-CM | POA: Diagnosis not present

## 2020-12-28 DIAGNOSIS — D485 Neoplasm of uncertain behavior of skin: Secondary | ICD-10-CM | POA: Diagnosis not present

## 2021-01-15 ENCOUNTER — Other Ambulatory Visit: Payer: Self-pay | Admitting: Internal Medicine

## 2021-01-15 ENCOUNTER — Other Ambulatory Visit: Payer: Self-pay | Admitting: Adult Health

## 2021-01-15 DIAGNOSIS — I10 Essential (primary) hypertension: Secondary | ICD-10-CM

## 2021-01-15 DIAGNOSIS — Z79899 Other long term (current) drug therapy: Secondary | ICD-10-CM

## 2021-01-15 DIAGNOSIS — E1169 Type 2 diabetes mellitus with other specified complication: Secondary | ICD-10-CM

## 2021-01-15 MED ORDER — ROSUVASTATIN CALCIUM 5 MG PO TABS: ORAL_TABLET | ORAL | 3 refills | Status: DC

## 2021-01-15 MED ORDER — FUROSEMIDE 40 MG PO TABS: ORAL_TABLET | ORAL | 3 refills | Status: DC

## 2021-01-17 DIAGNOSIS — C44329 Squamous cell carcinoma of skin of other parts of face: Secondary | ICD-10-CM | POA: Diagnosis not present

## 2021-01-17 DIAGNOSIS — C44319 Basal cell carcinoma of skin of other parts of face: Secondary | ICD-10-CM | POA: Diagnosis not present

## 2021-02-13 ENCOUNTER — Encounter: Payer: Self-pay | Admitting: Internal Medicine

## 2021-02-13 ENCOUNTER — Ambulatory Visit (INDEPENDENT_AMBULATORY_CARE_PROVIDER_SITE_OTHER): Payer: Medicare Other | Admitting: Internal Medicine

## 2021-02-13 ENCOUNTER — Other Ambulatory Visit: Payer: Self-pay

## 2021-02-13 VITALS — BP 130/66 | HR 75 | Temp 97.5°F | Resp 16 | Ht 62.0 in | Wt 208.6 lb

## 2021-02-13 DIAGNOSIS — E039 Hypothyroidism, unspecified: Secondary | ICD-10-CM | POA: Diagnosis not present

## 2021-02-13 DIAGNOSIS — E1122 Type 2 diabetes mellitus with diabetic chronic kidney disease: Secondary | ICD-10-CM | POA: Diagnosis not present

## 2021-02-13 DIAGNOSIS — E785 Hyperlipidemia, unspecified: Secondary | ICD-10-CM | POA: Diagnosis not present

## 2021-02-13 DIAGNOSIS — Z794 Long term (current) use of insulin: Secondary | ICD-10-CM

## 2021-02-13 DIAGNOSIS — E559 Vitamin D deficiency, unspecified: Secondary | ICD-10-CM | POA: Diagnosis not present

## 2021-02-13 DIAGNOSIS — E1169 Type 2 diabetes mellitus with other specified complication: Secondary | ICD-10-CM

## 2021-02-13 DIAGNOSIS — I10 Essential (primary) hypertension: Secondary | ICD-10-CM | POA: Diagnosis not present

## 2021-02-13 DIAGNOSIS — N1832 Chronic kidney disease, stage 3b: Secondary | ICD-10-CM | POA: Diagnosis not present

## 2021-02-13 DIAGNOSIS — Z79899 Other long term (current) drug therapy: Secondary | ICD-10-CM | POA: Diagnosis not present

## 2021-02-13 NOTE — Progress Notes (Signed)
Future Appointments  Date Time Provider [place]  02/13/2021  2:30 PM [name], MD [facility] None  08/20/2021 - CPE  3:00 PM [name], MD [facility] None  11/08/2021 - Wellness   2:30 PM Liane Comber, NP [facility] None    History of Present Illness:       This very nice 87 y.o. WWF presents for 6 month follow up with HTN, HLD, Insulin requiring T2 _DM, Hypothyroidism, Vascular Dementia   and Vitamin D Deficiency. Patient has moved in with her son  - Darrick Penna , her caregiver who supervises her meds and administers /regulates mer Insulin.        Patient is treated for HTN (1994)  & BP has been controlled at home. Today's BP is at goal -130/66. Patient has had no complaints of any cardiac type chest pain, palpitations, dyspnea Vertell Limber /PND, dizziness, claudication, or dependent edema.       Hyperlipidemia is controlled with diet & meds. Patient denies myalgias or other med SE's. Last Lipids were at goal except elevated Trig's:  Lab Results  Component Value Date   CHOL 146 08/07/2020   HDL 40 (L) 08/07/2020   LDLCALC 72 08/07/2020   TRIG 257 (H) 08/07/2020   CHOLHDL 3.7 08/07/2020     Also, the patient has history of   Insulin Requiring T2_DM (2003) with CKD3b  (GFR 34).  Patient's son Konrad Dolores monitors her CBG's 2 x /day and covers her with SS Novolin 70/30.   She has had no symptoms of reactive hypoglycemia, diabetic polys, paresthesias or visual blurring.  Last A1c was at goal:  Lab Results  Component Value Date   HGBA1C 5.8 (H) 08/07/2020                                               Patient has  been on Thyroid Replacement since she had Goiter  surgery in the 1970's.         Further, the patient also has history of Vitamin D Deficiency and supplements vitamin D without any suspected side-effects. Last vitamin D was at goal:   Lab Results  Component Value Date   VD25OH 82 08/07/2020     Current Outpatient Medications on File Prior to  Visit  Medication Sig   LD bASA EC 81 MG  Take      1 tablet        Daily   VITAMIN D 5000 u Take      1 capsule     2 x /day   DULoxetine 30 MG capsule TAKE 1 CAPSULE DAILY    furosemide 40 MG tablet Take  1 tablet 2 x /day f   RELION 70-30 AS DIRECTED FOR DIABETES   levothyroxine 100 MCG tablet Take  1 tablet  Daily     losartan  100 MG tablet TAKE 1 TABLET ONCE A DAY    Magnesium 400 MG CAPS Take      1 capsule       Daily   MegaRed Omega-3 Krill Oil 500 MG  Take      1 capsule      2 x /day     metFORMIN 500 MG tablet TAKE (1) TABLET TWICE A DAY    ICAPS CAPS Takle     1 tablet     Daily  for Eyes   rosuvastatin 5 MG tablet Take  1 tablet  Daily  for Cholesterol     Allergies  Allergen Reactions   Detrol [Tolterodine]     PMHx:   Past Medical History:  Diagnosis Date   Arthritis    GERD (gastroesophageal reflux disease)    HOH (hard of hearing)    Hyperlipidemia    Hypertension    Hypothyroid    IDDM (insulin dependent diabetes mellitus)    Leg swelling    Macular degeneration    Shortness of breath dyspnea    with exertion   Sleep apnea    does not wear CPAP   Varicose veins    Wears glasses      Immunization History  Administered Date(s) Administered   DT (Pediatric) 10/20/2014   Influenza Split 06/04/2017   Influenza, High Dose Seasonal PF 07/01/2016, 05/29/2018   Influenza,inj,quad, With Preservative 05/11/2017   Influenza-Unspecified 06/22/2013, 06/16/2014, 06/27/2014, 07/06/2015, 06/09/2019   Pneumococcal Conjugate-13 04/05/2014   Pneumococcal-Unspecified 09/09/2004   Td 09/10/2003   Zoster, Live 09/09/2005     Past Surgical History:  Procedure Laterality Date   ANTERIOR CERVICAL DECOMP/DISCECTOMY FUSION N/A 11/08/2014   Procedure: Cervical three-four,  Cervical six-seven anterior cervical decompression with fusion plating and bonegraft;  Surgeon: Ashok Pall, MD;  Location: [place];  Service: Neurosurgery;  Laterality: N/A;  Cervical  three-four,  Cervical six-seven anterior cervical decompression with fusion plating and bonegraft   BIOPSY THYROID     benign thyroid nodule removal   CATARACT EXTRACTION, BILATERAL     COLONOSCOPY W/ BIOPSIES AND POLYPECTOMY     ENDOMETRIAL BIOPSY  2000   SPINE SURGERY Left  11/08/2014   Left ACDF    TUBAL LIGATION     VARICOSE VEIN SURGERY      FHx:    Reviewed / unchanged  SHx:    Reviewed / unchanged   Systems Review:  Constitutional: Denies fever, chills, wt changes, headaches, insomnia, fatigue, night sweats, change in appetite. Eyes: Denies redness, blurred vision, diplopia, discharge, itchy, watery eyes.  ENT: Denies discharge, congestion, post nasal drip, epistaxis, sore throat, earache, hearing loss, dental pain, tinnitus, vertigo, sinus pain, snoring.  CV: Denies chest pain, palpitations, irregular heartbeat, syncope, dyspnea, diaphoresis, orthopnea, PND, claudication or edema. Respiratory: denies cough, dyspnea, DOE, pleurisy, hoarseness, laryngitis, wheezing.  Gastrointestinal: Denies dysphagia, odynophagia, heartburn, reflux, water brash, abdominal pain or cramps, nausea, vomiting, bloating, diarrhea, constipation, hematemesis, melena, hematochezia  or hemorrhoids. Genitourinary: Denies dysuria, frequency, urgency, nocturia, hesitancy, discharge, hematuria or flank pain. Musculoskeletal: Denies arthralgias, myalgias, stiffness, jt. swelling, pain, limping or strain/sprain.  Skin: Denies pruritus, rash, hives, warts, acne, eczema or change in skin lesion(s). Neuro: No weakness, tremor, incoordination, spasms, paresthesia or pain. Psychiatric: Denies confusion, memory loss or sensory loss. Endo: Denies change in weight, skin or hair change.  Heme/Lymph: No excessive bleeding, bruising or enlarged lymph nodes.  Physical Exam  BP 130/66 (BP Location: Right Arm, Patient Position: Sitting, Cuff Size: Large)   Pulse 75   Temp (!) 97.5 F (36.4 C)   Resp 16   Ht 5\' 2"   (1.575 m)   Wt 208 lb 9.6 oz (94.6 kg)   SpO2 97%   BMI 38.15 kg/m   Appears  well nourished, well groomed  and in no distress.  Eyes: PERRLA, EOMs, conjunctiva no swelling or erythema. Sinuses: No frontal/maxillary tenderness ENT/Mouth: EAC's clear, TM's nl w/o erythema, bulging. Nares clear w/o erythema, swelling, exudates. Oropharynx clear without erythema  or exudates. Oral hygiene is good. Tongue normal, non obstructing. Hearing intact.  Neck: Supple. Thyroid not palpable. Car 2+/2+ without bruits, nodes or JVD. Chest: Respirations nl with BS clear & equal w/o rales, rhonchi, wheezing or stridor.  Cor: Heart sounds normal w/ regular rate and rhythm without sig. murmurs, gallops, clicks or rubs. Peripheral pulses normal and equal  without edema.  Abdomen: Soft & bowel sounds normal. Non-tender w/o guarding, rebound, hernias, masses or organomegaly.  Lymphatics: Unremarkable.  Musculoskeletal: Full ROM all peripheral extremities, joint stability, 5/5 strength and normal gait.  Skin: Warm, dry without exposed rashes, lesions or ecchymosis apparent.  Neuro: Cranial nerves intact, reflexes equal bilaterally. Sensory-motor testing grossly intact. Tendon reflexes grossly intact.  Pysch: Alert & oriented x 3.  Insight and judgement nl & appropriate. No ideations.  Assessment and Plan:  1. Essential hypertension  - Continue medication, monitor blood pressure at home.  - Continue DASH diet.  Reminder to go to the ER if any CP,  SOB, nausea, dizziness, severe HA, changes vision/speech.  - CBC with Differential/Platelet - COMPLETE METABOLIC PANEL WITH GFR - Magnesium - TSH  2. Hyperlipidemia associated with type 2 diabetes mellitus ([place])  - Continue diet/meds, exercise,& lifestyle modifications.  - Continue monitor periodic cholesterol/liver & renal functions   - Lipid panel - TSH  3. Type 2 diabetes mellitus with stage 3b chronic kidney  disease, with long-term current use of  insulin (HCC)  - Continue diet, exercise  - Lifestyle modifications.  - Monitor appropriate labs  - Hemoglobin A1c  4. Vitamin D deficiency  - Continue supplementation.  - VITAMIN D 25 Hydroxy  5. Hypothyroidism  - TSH  6. Medication management  - CBC with Differential/Platelet - COMPLETE METABOLIC PANEL WITH GFR - Magnesium - Lipid panel - TSH - Hemoglobin A1c - VITAMIN D 25 Hydroxyl         Discussed  regular exercise, BP monitoring, weight control to achieve/maintain BMI less than 25 and discussed med and SE's. Recommended labs to assess and monitor clinical status with further disposition pending results of labs.  I discussed the assessment and treatment plan with the patient. The patient was provided an opportunity to ask questions and all were answered. The patient agreed with the plan and demonstrated an understanding of the instructions.  I provided over 30 minutes of exam, counseling, chart review and  complex critical decision making.        The patient was advised to call back or seek an in-person evaluation if the symptoms worsen or if the condition fails to improve as anticipated.   Kirtland Bouchard, MD

## 2021-02-13 NOTE — Patient Instructions (Signed)

## 2021-02-14 DIAGNOSIS — D0439 Carcinoma in situ of skin of other parts of face: Secondary | ICD-10-CM | POA: Diagnosis not present

## 2021-02-14 LAB — LIPID PANEL
Cholesterol: 137 mg/dL (ref <200)
HDL: 53 mg/dL (ref >=50)
LDL Cholesterol (Calc): 57 mg/dL (calc)
Non-HDL Cholesterol (Calc): 84 mg/dL (calc) (ref <130)
Total CHOL/HDL Ratio: 2.6 (calc) (ref <5.0)
Triglycerides: 208 mg/dL — ABNORMAL HIGH (ref <150)

## 2021-02-14 LAB — COMPLETE METABOLIC PANEL WITH GFR
AG Ratio: 1.6 (calc) (ref 1.0–2.5)
ALT: 17 U/L (ref 6–29)
AST: 18 U/L (ref 10–35)
Albumin: 4.1 g/dL (ref 3.6–5.1)
Alkaline phosphatase (APISO): 45 U/L (ref 37–153)
BUN: 20 mg/dL (ref 7–25)
CO2: 34 mmol/L — ABNORMAL HIGH (ref 20–32)
Calcium: 10 mg/dL (ref 8.6–10.4)
Chloride: 99 mmol/L (ref 98–110)
Creat: 0.78 mg/dL (ref 0.60–0.88)
GFR, Est African American: 79 mL/min/{1.73_m2} (ref >=60)
GFR, Est Non African American: 68 mL/min/{1.73_m2} (ref >=60)
Globulin: 2.5 g/dL (calc) (ref 1.9–3.7)
Glucose, Bld: 54 mg/dL — ABNORMAL LOW (ref 65–99)
Potassium: 4.6 mmol/L (ref 3.5–5.3)
Sodium: 141 mmol/L (ref 135–146)
Total Bilirubin: 0.3 mg/dL (ref 0.2–1.2)
Total Protein: 6.6 g/dL (ref 6.1–8.1)

## 2021-02-14 LAB — CBC WITH DIFFERENTIAL/PLATELET
Absolute Monocytes: 1092 cells/uL — ABNORMAL HIGH (ref 200–950)
Basophils Absolute: 76 cells/uL (ref 0–200)
Basophils Relative: 0.6 %
Eosinophils Absolute: 838 cells/uL — ABNORMAL HIGH (ref 15–500)
Eosinophils Relative: 6.6 %
HCT: 39.2 % (ref 35.0–45.0)
Hemoglobin: 12.6 g/dL (ref 11.7–15.5)
Lymphs Abs: 2718 cells/uL (ref 850–3900)
MCH: 27.9 pg (ref 27.0–33.0)
MCHC: 32.1 g/dL (ref 32.0–36.0)
MCV: 86.9 fL (ref 80.0–100.0)
MPV: 11.3 fL (ref 7.5–12.5)
Monocytes Relative: 8.6 %
Neutro Abs: 7976 cells/uL — ABNORMAL HIGH (ref 1500–7800)
Neutrophils Relative %: 62.8 %
Platelets: 352 10*3/uL (ref 140–400)
RBC: 4.51 10*6/uL (ref 3.80–5.10)
RDW: 12.8 % (ref 11.0–15.0)
Total Lymphocyte: 21.4 %
WBC: 12.7 10*3/uL — ABNORMAL HIGH (ref 3.8–10.8)

## 2021-02-14 LAB — VITAMIN D 25 HYDROXY (VIT D DEFICIENCY, FRACTURES): Vit D, 25-Hydroxy: 58 ng/mL (ref 30–100)

## 2021-02-14 LAB — TSH: TSH: 1.39 mIU/L (ref 0.40–4.50)

## 2021-02-14 LAB — MAGNESIUM: Magnesium: 1.5 mg/dL (ref 1.5–2.5)

## 2021-02-14 LAB — HEMOGLOBIN A1C
Hgb A1c MFr Bld: 7.1 % of total Hgb — ABNORMAL HIGH (ref <5.7)
Mean Plasma Glucose: 157 mg/dL
eAG (mmol/L): 8.7 mmol/L

## 2021-02-15 NOTE — Progress Notes (Signed)
Pt son was called per pt chart to discuss her lab results. Pt son stated he understood her results and will help his mom to follow Dr. Melford Aase directions. Pt son also requested a copy of her labs it was put in out going mail today.

## 2021-02-15 NOTE — Progress Notes (Signed)
Pt son was called to discuss his mom lab results  per pt chart.Pt son stated he understood and will try to get is mother to follow the doctor directions. The lab results was mailed to pt home per her son Konrad Dolores request.

## 2021-02-16 ENCOUNTER — Other Ambulatory Visit: Payer: Self-pay | Admitting: Adult Health

## 2021-02-16 ENCOUNTER — Other Ambulatory Visit: Payer: Self-pay | Admitting: Internal Medicine

## 2021-02-16 DIAGNOSIS — I10 Essential (primary) hypertension: Secondary | ICD-10-CM

## 2021-02-16 DIAGNOSIS — Z79899 Other long term (current) drug therapy: Secondary | ICD-10-CM

## 2021-02-19 ENCOUNTER — Other Ambulatory Visit: Payer: Self-pay | Admitting: Adult Health

## 2021-02-19 DIAGNOSIS — E1122 Type 2 diabetes mellitus with diabetic chronic kidney disease: Secondary | ICD-10-CM

## 2021-02-28 DIAGNOSIS — D485 Neoplasm of uncertain behavior of skin: Secondary | ICD-10-CM | POA: Diagnosis not present

## 2021-02-28 DIAGNOSIS — C44319 Basal cell carcinoma of skin of other parts of face: Secondary | ICD-10-CM | POA: Diagnosis not present

## 2021-02-28 DIAGNOSIS — C44329 Squamous cell carcinoma of skin of other parts of face: Secondary | ICD-10-CM | POA: Diagnosis not present

## 2021-04-04 NOTE — Progress Notes (Signed)
Future Appointments  Date Time Provider [place]  04/05/2021 11:00 AM [name], MD [facility] None  04/16/2021  2:00 PM Rankin, Clent Demark, MD [facility]DE[facility] None  05/21/2021  2:30 PM Magda Bernheim, NP [facility] None  08/20/2021  3:00 PM [name], MD [facility] None  11/08/2021  2:30 PM Liane Comber, NP [facility] None    History of Present Illness:      This very nice 87 y.o. WWF presents for 6 month follow up with HTN, HLD, Insulin requiring T2 _DM, Hypothyroidism, Vascular Dementia   and Vitamin D Deficiency.  Patient is brought in by her caretaker son, Konrad Dolores, for concern or complaints of  swelling of the lower extremities . She denies any exertionas CP, DOE or GI sx's. Patient has moved in with her caretaker son - Konrad Dolores  - who monitors her CBG's & supervised her insulin dosing.  General systems review is negative.    Medications    NOVOLIN 70/30 , INJECT 50 UNITS [place] TWICE DAILY BEFORE MEAL   levothyroxine 100 MCG tablet, Take  1 tablet  Daily     metFORMIN 500 MG tablet, TAKE (1) TABLET TWICE A DAY WITH MEALS    furosemide  40 MG tablet, Take  1 tablet 2 x /day for Fluid Retention /Ankle Swelling   losartan 100 MG tablet, Take  1 tablet  Daily  for BP   rosuvastatin 5 MG tablet, Take  1 tablet  Daily  for Cholesterol   LOW DOSE ASA 81 MG EC tablet, Take  1 tablet Daily   VITAMIN  5000 u Take  1 capsule 2 x /day   DULoxetine 30 MG , Take  1 capsule  Daily   Magnesium 400 MG CAPS, Take 1 capsule Daily   MegaRed Omega-3 Krill Oil 500 MG CAPS, Take 1 capsule 2 x /day     ICAPS, Takle  1 tablet Daily   Problem list She has Essential hypertension; Hypothyroidism; CKD stage 3 due to type 2 diabetes mellitus ([place]); Hyperlipidemia associated with type 2 diabetes mellitus ([place]); Macular degeneration; Vitamin D deficiency; Chronic venous insufficiency; Obesity (BMI 30.0-34.9); DDD (degenerative disc disease), lumbar; Cervical spondylosis with myelopathy; Diabetic  polyneuropathy associated with type 2 diabetes mellitus ([place]); Type 2 diabetes mellitus with stage 3b chronic kidney disease, with long-term current use of insulin ([place]); Vascular dementia without behavioral disturbance ([place]); Exudative age-related macular degeneration of left eye with active choroidal neovascularization ([place]); Retinal hemorrhage of left eye; Lamellar macular hole of right eye; Dry eyes; Advanced nonexudative age-related macular degeneration of both eyes with subfoveal involvement; Posterior vitreous detachment of left eye; and Osteopenia of multiple sites on their problem list.   Observations/Objective:  BP 130/78   P 80   T 97 F    R 17   Ht '5\' 2"'$    Wt 202 lb   SpO2 98%   BMI 36.95  HEENT - WNL. Neck - supple.  Chest - Clear equal BS. Cor - Nl HS. RRR w/o sig MGR. PP 1(+). No edema. MS- FROM w/o deformities.  Gait Nl. Neuro -  Nl w/o focal abnormalities.  Assessment and Plan:  1. Essential hypertension  2. Hyperlipidemia associated with type 2 diabetes mellitus ([place])  3. Type 2 diabetes mellitus with stage 3b chronic kidney  disease, with long-term current use of insulin ([place])  4. Hypothyroidism, unspecified type   Follow Up Instructions:  has follow-up 3 month OV on 9/12 for quarterly labs.  I discussed the assessment and treatment plan with the patient. The patient was provided an opportunity to ask questions and all were answered. The patient agreed with the plan and demonstrated an understanding of the instructions.       The patient was advised to call back or seek an in-person evaluation if the symptoms worsen or if the condition fails to improve as anticipated.    Kirtland Bouchard, MD

## 2021-04-05 ENCOUNTER — Ambulatory Visit (INDEPENDENT_AMBULATORY_CARE_PROVIDER_SITE_OTHER): Payer: Medicare Other | Admitting: Internal Medicine

## 2021-04-05 ENCOUNTER — Encounter: Payer: Self-pay | Admitting: Internal Medicine

## 2021-04-05 ENCOUNTER — Other Ambulatory Visit: Payer: Self-pay

## 2021-04-05 VITALS — BP 130/78 | HR 80 | Temp 97.0°F | Resp 17 | Ht 62.0 in | Wt 202.0 lb

## 2021-04-05 DIAGNOSIS — E1169 Type 2 diabetes mellitus with other specified complication: Secondary | ICD-10-CM | POA: Diagnosis not present

## 2021-04-05 DIAGNOSIS — I10 Essential (primary) hypertension: Secondary | ICD-10-CM

## 2021-04-05 DIAGNOSIS — E1122 Type 2 diabetes mellitus with diabetic chronic kidney disease: Secondary | ICD-10-CM

## 2021-04-05 DIAGNOSIS — E039 Hypothyroidism, unspecified: Secondary | ICD-10-CM | POA: Diagnosis not present

## 2021-04-05 DIAGNOSIS — E785 Hyperlipidemia, unspecified: Secondary | ICD-10-CM

## 2021-04-05 DIAGNOSIS — Z794 Long term (current) use of insulin: Secondary | ICD-10-CM | POA: Diagnosis not present

## 2021-04-05 DIAGNOSIS — N1832 Chronic kidney disease, stage 3b: Secondary | ICD-10-CM | POA: Diagnosis not present

## 2021-04-12 DIAGNOSIS — D0439 Carcinoma in situ of skin of other parts of face: Secondary | ICD-10-CM | POA: Diagnosis not present

## 2021-04-12 DIAGNOSIS — Z85828 Personal history of other malignant neoplasm of skin: Secondary | ICD-10-CM | POA: Diagnosis not present

## 2021-04-12 DIAGNOSIS — D485 Neoplasm of uncertain behavior of skin: Secondary | ICD-10-CM | POA: Diagnosis not present

## 2021-04-12 DIAGNOSIS — C4402 Squamous cell carcinoma of skin of lip: Secondary | ICD-10-CM | POA: Diagnosis not present

## 2021-04-12 DIAGNOSIS — L821 Other seborrheic keratosis: Secondary | ICD-10-CM | POA: Diagnosis not present

## 2021-04-12 DIAGNOSIS — C44629 Squamous cell carcinoma of skin of left upper limb, including shoulder: Secondary | ICD-10-CM | POA: Diagnosis not present

## 2021-04-12 DIAGNOSIS — L905 Scar conditions and fibrosis of skin: Secondary | ICD-10-CM | POA: Diagnosis not present

## 2021-04-15 ENCOUNTER — Encounter: Payer: Self-pay | Admitting: Internal Medicine

## 2021-04-15 NOTE — Patient Instructions (Signed)

## 2021-04-16 ENCOUNTER — Encounter (INDEPENDENT_AMBULATORY_CARE_PROVIDER_SITE_OTHER): Payer: Self-pay | Admitting: Ophthalmology

## 2021-04-16 ENCOUNTER — Other Ambulatory Visit: Payer: Self-pay

## 2021-04-16 ENCOUNTER — Ambulatory Visit (INDEPENDENT_AMBULATORY_CARE_PROVIDER_SITE_OTHER): Payer: Medicare Other | Admitting: Ophthalmology

## 2021-04-16 DIAGNOSIS — H35341 Macular cyst, hole, or pseudohole, right eye: Secondary | ICD-10-CM | POA: Diagnosis not present

## 2021-04-16 DIAGNOSIS — H353221 Exudative age-related macular degeneration, left eye, with active choroidal neovascularization: Secondary | ICD-10-CM | POA: Diagnosis not present

## 2021-04-16 MED ORDER — BEVACIZUMAB 2.5 MG/0.1ML IZ SOSY
2.5000 mg | PREFILLED_SYRINGE | INTRAVITREAL | Status: AC | PRN
  Administered 2021-04-16: 2.5 mg via INTRAVITREAL

## 2021-04-16 NOTE — Assessment & Plan Note (Signed)
Active disease prior control and stabilization acuity 20/50 recent examination in December 2021, however missed follow-up in February 2022 has now resulted in a 57-monthdelay in evaluation with new large recurrence of subretinal hemorrhage in the macular region with a decline in acuity and intraretinal fluid and CME threatening further vision loss OS

## 2021-04-16 NOTE — Assessment & Plan Note (Signed)
OD, no change by OCT evaluation

## 2021-04-16 NOTE — Progress Notes (Signed)
04/16/2021     CHIEF COMPLAINT Patient presents for Retina Follow Up (7 month fu OS and Avastin OS/Pt states VA OU stable since last visit. Pt denies FOL, floaters, or ocular pain OU. Catherine Wood: 7.1/LBS: Unknown/)   HISTORY OF PRESENT ILLNESS: Catherine Wood is a 87 y.o. female who presents to the clinic today for:   HPI     Retina Follow Up           Diagnosis: Wet AMD   Laterality: left eye   Onset: 7 months ago   Severity: mild   Duration: 7 months   Course: stable   Comments: 7 month fu OS and Avastin OS Pt states VA OU stable since last visit. Pt denies FOL, floaters, or ocular pain OU.  A1C: 7.1 LBS: Unknown          Comments   Patient missed appointment scheduled for February some 6 months previous, no interval change in acuity per patient's report however  Substantial drop in acuity noted however on testing today      Last edited by Hurman Horn, MD on 04/16/2021  2:23 PM.      Referring physician: [name], MD [address] [place],  [place] [address]  HISTORICAL INFORMATION:   Selected notes from the MEDICAL RECORD NUMBER    Lab Results  Component Value Date   HGBA1C 7.1 (H) 02/13/2021     CURRENT MEDICATIONS: No current outpatient medications on file. (Ophthalmic Drugs)   No current facility-administered medications for this visit. (Ophthalmic Drugs)   Current Outpatient Medications (Other)  Medication Sig   ACCU-CHEK SOFTCLIX LANCETS lancets CHECK BLOOD SUGER UP TO 3 TIMES A DAY   aspirin (SB LOW DOSE ASA EC) 81 MG EC tablet Take      1 tablet        Daily   Cholecalciferol (VITAMIN D3) 125 MCG (5000 UT) CAPS Take      1 capsule     2 x /day   ciprofloxacin (CIPRO) 250 MG tablet Take      1 tablet       2 x /day       with Food      for Infection (Patient not taking: Reported on 02/13/2021)   DULoxetine (CYMBALTA) 30 MG capsule Take  1 capsule  Daily  for Mood & Chronic Pain   furosemide (LASIX) 40 MG tablet Take  1 tablet  2 x /day for Fluid Retention /Ankle Swelling   insulin NPH-regular Human (NOVOLIN 70/30 RELION) (70-30) 100 UNIT/ML injection INJECT 50 UNITS SUBCUTANEOUSLY TWICE DAILY BEFORE MEAL(S) AS DIRECTED FOR DIABETES. MAX OF 100 UNITS DAILY   Insulin Pen Needle (PEN NEEDLES 31GX5/16") 31G X 8 MM MISC USE 4 TIMES A DAY   levothyroxine (SYNTHROID) 100 MCG tablet Take  1 tablet  Daily  on an empty stomach with only water for 30 minutes & no Antacid meds, Calcium or Magnesium for 4 hours & avoid Biotin   losartan (COZAAR) 100 MG tablet Take  1 tablet  Daily  for BP   Magnesium 400 MG CAPS Take      1 capsule       Daily   MegaRed Omega-3 Krill Oil 500 MG CAPS Take      1 capsule      2 x /day         for Triglycerides  (Blood Fats)   metFORMIN (GLUCOPHAGE) 500 MG tablet TAKE (1) TABLET TWICE A  DAY WITH MEALS (BREAKFAST AND SUPPER)   Multiple Vitamins-Minerals (ICAPS) CAPS Takle     1 tablet     Daily for Eyes   rosuvastatin (CRESTOR) 5 MG tablet Take  1 tablet  Daily  for Cholesterol   No current facility-administered medications for this visit. (Other)      REVIEW OF SYSTEMS:    ALLERGIES Allergies  Allergen Reactions   Detrol [Tolterodine]     PAST MEDICAL HISTORY Past Medical History:  Diagnosis Date   Arthritis    GERD (gastroesophageal reflux disease)    HOH (hard of hearing)    Hyperlipidemia    Hypertension    Hypothyroid    IDDM (insulin dependent diabetes mellitus)    Leg swelling    Macular degeneration    Shortness of breath dyspnea    with exertion   Sleep apnea    does not wear CPAP   Varicose veins    Wears glasses    Past Surgical History:  Procedure Laterality Date   ANTERIOR CERVICAL DECOMP/DISCECTOMY FUSION N/A 11/08/2014   Procedure: Cervical three-four,  Cervical six-seven anterior cervical decompression with fusion plating and bonegraft;  Surgeon: Ashok Pall, MD;  Location: [facility];  Service: Neurosurgery;  Laterality: N/A;  Cervical three-four,  Cervical  six-seven anterior cervical decompression with fusion plating and bonegraft   BIOPSY THYROID     benign thyroid nodule removal   CATARACT EXTRACTION, BILATERAL     COLONOSCOPY W/ BIOPSIES AND POLYPECTOMY     ENDOMETRIAL BIOPSY  2000   SPINE SURGERY Left  11/08/2014   Left ACDF    TUBAL LIGATION     VARICOSE VEIN SURGERY      FAMILY HISTORY Family History  Problem Relation Age of Onset   Cancer Mother        colon   Cancer Father        GI   Breast cancer Neg Hx     SOCIAL HISTORY Social History   Tobacco Use   Smoking status: Never   Smokeless tobacco: Never  Substance Use Topics   Alcohol use: No   Drug use: No         OPHTHALMIC EXAM:  Base Eye Exam     Visual Acuity (ETDRS)       Right Left   Dist cc HM 20/100 -1   Dist ph cc  NI    Correction: Glasses         Tonometry (Tonopen, 2:03 PM)       Right Left   Pressure 20 18         Pupils       Pupils Dark Light Shape React APD   Right PERRL 5 5 Round Minimal None   Left PERRL 4 4 Round Minimal None         Visual Fields (Counting fingers)       Left Right    Full Full         Extraocular Movement       Right Left    Full Full         Neuro/Psych     Oriented x3: Yes   Mood/Affect: Normal         Dilation     Left eye: 1.0% Mydriacyl, 2.5% Phenylephrine @ 2:03 PM           Slit Lamp and Fundus Exam     External Exam       Right Left  External Normal Normal         Slit Lamp Exam       Right Left   Lids/Lashes Normal Normal   Conjunctiva/Sclera White and quiet White and quiet   Cornea Clear Clear   Anterior Chamber Deep and quiet Deep and quiet   Iris Round and reactive Round and reactive   Lens Posterior chamber intraocular lens Posterior chamber intraocular lens   Anterior Vitreous Normal Normal         Fundus Exam       Right Left   Posterior Vitreous  Posterior vitreous detachment   Disc  Normal   C/D Ratio  0.3   Macula  Retinal  pigment epithelial atrophy, Geographic atrophy, Advanced age related macular degeneration,Microaneurysms, Subretinal neovascular membrane, Subretinal hemorrhage large inferior to the fovea and along the inferotemporal arcade   Vessels  Normal   Periphery  Normal            IMAGING AND PROCEDURES  Imaging and Procedures for 04/16/21  OCT, Retina - OU - Both Eyes       Right Eye Quality was good. Scan locations included subfoveal. Central Foveal Thickness: 247. Progression has been stable. Findings include subretinal scarring, disciform scar, abnormal foveal contour, no SRF, no IRF.   Left Eye Quality was good. Scan locations included subfoveal. Central Foveal Thickness: 368. Progression has worsened. Findings include cystoid macular edema, intraretinal hyper-reflective material, subretinal fluid, intraretinal fluid.   Notes Subretinal scarring, disciform scar OD, no interval change.  OS large amount of CME and intraretinal fluid recurrent today at some 9 months post most recent evaluation and 6 months delay in follow-up.     Intravitreal Injection, Pharmacologic Agent - OS - Left Eye       Time Out 04/16/2021. 2:25 PM. Confirmed correct patient, procedure, site, and patient consented.   Anesthesia Topical anesthesia was used. Anesthetic medications included [place] 3.5%.   Procedure Preparation included Ofloxacin , Tobramycin 0.3%, 10% betadine to eyelids, 5% betadine to ocular surface. A 30 gauge needle was used.   Injection: 2.5 mg bevacizumab 2.5 MG/0.1ML   Route: Intravitreal, Site: Left Eye   NDC: [phone], Lot: [id number]   Post-op Post injection exam found visual acuity of at least counting fingers. The patient tolerated the procedure well. There were no complications. The patient received written and verbal post procedure care education. Post injection medications were not given.              ASSESSMENT/PLAN:  Exudative age-related macular degeneration of  left eye with active choroidal neovascularization ([place]) Active disease prior control and stabilization acuity 20/50 recent examination in December 2021, however missed follow-up in February 2022 has now resulted in a 31-monthdelay in evaluation with new large recurrence of subretinal hemorrhage in the macular region with a decline in acuity and intraretinal fluid and CME threatening further vision loss OS  Lamellar macular hole of right eye OD, no change by OCT evaluation     ICD-10-CM   1. Exudative age-related macular degeneration of left eye with active choroidal neovascularization (HCC)  H35.3221 OCT, Retina - OU - Both Eyes    Intravitreal Injection, Pharmacologic Agent - OS - Left Eye    bevacizumab (AVASTIN) SOSY 2.5 mg    2. Lamellar macular hole of right eye  H35.341       1.  OS with recurrence and large subretinal hemorrhage from wet AMD will need to commence and restart therapy and follow-up again in 5  to 6 weeks OS and attempt to recover vision as well as stabilize and prevent further vision loss  2.  No active disease OD will observe  3.  Ophthalmic Meds Ordered this visit:  Meds ordered this encounter  Medications   bevacizumab (AVASTIN) SOSY 2.5 mg       Return in about 6 weeks (around 05/28/2021) for DILATE OU, AVASTIN OCT, OS, COLOR FP.  There are no Patient Instructions on file for this visit.   Explained the diagnoses, plan, and follow up with the patient and they expressed understanding.  Patient expressed understanding of the importance of proper follow up care.   Clent Demark Tri Chittick M.D. Diseases & Surgery of the Retina and Vitreous [facility] [place] 04/16/21     Abbreviations: M myopia (nearsighted); A astigmatism; H hyperopia (farsighted); P presbyopia; Mrx spectacle prescription;  CTL contact lenses; OD right eye; OS left eye; OU both eyes  XT exotropia; ET esotropia; PEK punctate epithelial keratitis; PEE punctate epithelial erosions; DES  dry eye syndrome; MGD meibomian gland dysfunction; ATs artificial tears; PFAT's preservative free artificial tears; [place] nuclear sclerotic cataract; PSC posterior subcapsular cataract; ERM epi-retinal membrane; PVD posterior vitreous detachment; RD retinal detachment; DM diabetes mellitus; DR diabetic retinopathy; NPDR non-proliferative diabetic retinopathy; PDR proliferative diabetic retinopathy; CSME clinically significant macular edema; DME diabetic macular edema; dbh dot blot hemorrhages; CWS cotton wool spot; POAG primary open angle glaucoma; C/D cup-to-disc ratio; HVF humphrey visual field; GVF goldmann visual field; OCT optical coherence tomography; IOP intraocular pressure; BRVO Branch retinal vein occlusion; CRVO central retinal vein occlusion; CRAO central retinal artery occlusion; BRAO branch retinal artery occlusion; RT retinal tear; SB scleral buckle; PPV pars plana vitrectomy; VH Vitreous hemorrhage; PRP panretinal laser photocoagulation; IVK intravitreal kenalog; VMT vitreomacular traction; MH Macular hole;  NVD neovascularization of the disc; NVE neovascularization elsewhere; AREDS age related eye disease study; ARMD age related macular degeneration; POAG primary open angle glaucoma; EBMD epithelial/anterior basement membrane dystrophy; ACIOL anterior chamber intraocular lens; IOL intraocular lens; PCIOL posterior chamber intraocular lens; Phaco/IOL phacoemulsification with intraocular lens placement; [place] photorefractive keratectomy; LASIK laser assisted in situ keratomileusis; HTN hypertension; DM diabetes mellitus; COPD chronic obstructive pulmonary disease

## 2021-04-24 DIAGNOSIS — M79676 Pain in unspecified toe(s): Secondary | ICD-10-CM | POA: Diagnosis not present

## 2021-04-24 DIAGNOSIS — B351 Tinea unguium: Secondary | ICD-10-CM | POA: Diagnosis not present

## 2021-04-24 DIAGNOSIS — L84 Corns and callosities: Secondary | ICD-10-CM | POA: Diagnosis not present

## 2021-04-24 DIAGNOSIS — E1151 Type 2 diabetes mellitus with diabetic peripheral angiopathy without gangrene: Secondary | ICD-10-CM | POA: Diagnosis not present

## 2021-05-06 ENCOUNTER — Other Ambulatory Visit: Payer: Self-pay | Admitting: Adult Health

## 2021-05-06 DIAGNOSIS — Z794 Long term (current) use of insulin: Secondary | ICD-10-CM

## 2021-05-16 ENCOUNTER — Other Ambulatory Visit: Payer: Self-pay | Admitting: Adult Health

## 2021-05-21 ENCOUNTER — Ambulatory Visit (INDEPENDENT_AMBULATORY_CARE_PROVIDER_SITE_OTHER): Payer: Medicare Other | Admitting: Nurse Practitioner

## 2021-05-21 ENCOUNTER — Encounter: Payer: Self-pay | Admitting: Nurse Practitioner

## 2021-05-21 ENCOUNTER — Other Ambulatory Visit: Payer: Self-pay

## 2021-05-21 VITALS — BP 118/68 | HR 67 | Temp 97.7°F | Wt 203.6 lb

## 2021-05-21 DIAGNOSIS — Z794 Long term (current) use of insulin: Secondary | ICD-10-CM | POA: Diagnosis not present

## 2021-05-21 DIAGNOSIS — E039 Hypothyroidism, unspecified: Secondary | ICD-10-CM

## 2021-05-21 DIAGNOSIS — E1169 Type 2 diabetes mellitus with other specified complication: Secondary | ICD-10-CM | POA: Diagnosis not present

## 2021-05-21 DIAGNOSIS — E785 Hyperlipidemia, unspecified: Secondary | ICD-10-CM | POA: Diagnosis not present

## 2021-05-21 DIAGNOSIS — E559 Vitamin D deficiency, unspecified: Secondary | ICD-10-CM | POA: Diagnosis not present

## 2021-05-21 DIAGNOSIS — N1832 Chronic kidney disease, stage 3b: Secondary | ICD-10-CM | POA: Diagnosis not present

## 2021-05-21 DIAGNOSIS — D509 Iron deficiency anemia, unspecified: Secondary | ICD-10-CM | POA: Diagnosis not present

## 2021-05-21 DIAGNOSIS — I10 Essential (primary) hypertension: Secondary | ICD-10-CM | POA: Diagnosis not present

## 2021-05-21 DIAGNOSIS — F0150 Vascular dementia without behavioral disturbance: Secondary | ICD-10-CM

## 2021-05-21 DIAGNOSIS — E1122 Type 2 diabetes mellitus with diabetic chronic kidney disease: Secondary | ICD-10-CM | POA: Diagnosis not present

## 2021-05-21 NOTE — Patient Instructions (Signed)

## 2021-05-21 NOTE — Progress Notes (Signed)
FOLLOW UP  Assessment and Plan:   Yoseli was seen today for follow-up.  Diagnoses and all orders for this visit:  Essential hypertension -     CBC with Differential/Platelet -- continue medications, DASH diet, exercise and monitor at home. Call if greater than 130/80.    Hyperlipidemia associated with type 2 diabetes mellitus (HCC) -     COMPLETE METABOLIC PANEL WITH GFR -     Lipid panel - Continue medication, diet and exercise  Type 2 diabetes mellitus with stage 3b chronic kidney disease, with long-term current use of insulin (HCC) -     COMPLETE METABOLIC PANEL WITH GFR -     Hemoglobin A1c  Continue diet and exercise and monitor blood sugar  Novolin 70/30 50 units twice a day before meals  Hypothyroidism, unspecified type -     TSH  Continue Levothyroxine 133mg daily and will adjust pending results  Vitamin D deficiency  Continue Vit D supplementation   Vascular dementia without behavioral disturbance (HCC)  Continue to have son monitor symptoms and notify office and worsens or starts to need assistance   Iron deficiency anemia, unspecified iron deficiency anemia type -     CBC with Differential/Platelet -     Iron, Total/Total Iron Binding Cap -     Ferritin    Continue diet and meds as discussed. Further disposition pending results of labs. Discussed med's effects and SE's.   Over 30 minutes of exam, counseling, chart review, and critical decision making was performed.   Future Appointments  Date Time Provider [place] 05/28/2021  1:45 PM Rankin, GClent Demark MD RDE-RDE None  08/20/2021  3:00 PM MUnk Pinto MD [facility] None  11/08/2021  2:30 PM CLiane Comber NP [facility] None    ----------------------------------------------------------------------------------------------------------------------  HPI 87y.o. female  presents for 3 month follow up on hypertension, cholesterol, diabetes, weight and vitamin D deficiency.   BMI is Body mass  index is 37.24 kg/m., she has not been working on diet and exercise. Wt Readings from Last 3 Encounters:  05/21/21 203 lb 9.6 oz (92.4 kg)  04/05/21 202 lb (91.6 kg)  02/13/21 208 lb 9.6 oz (94.6 kg)   Pt sleepy most of the day.  Son can only get her to walk maybe once a day.  Balance is off   Uses cane when out with son, walker in the house.  Her blood pressure has been controlled at home, today their BP is BP: 118/68 BP Readings from Last 3 Encounters:  05/21/21 118/68  04/05/21 130/78  02/13/21 130/66     She does not workout. She denies chest pain, shortness of breath, dizziness.   She is on cholesterol medication Rosuvastatin and denies myalgias. Her cholesterol is at goal. The cholesterol last visit was:   Lab Results  Component Value Date   CHOL 137 02/13/2021   HDL 53 02/13/2021   LDLCALC 57 02/13/2021   TRIG 208 (H) 02/13/2021   CHOLHDL 2.6 02/13/2021    Blood sugars running 110- 120 She has been working on diet and exercise for diabetes, and denies hyperglycemia  , hypoglycemia , nausea, paresthesia of the feet, and polydipsia. Last A1C in the office was:  Lab Results  Component Value Date   HGBA1C 7.1 (H) 02/13/2021   Patient is on Vitamin D supplement.   Lab Results  Component Value Date   VD25OH 55106/03/2021        Current Medications:  Current Outpatient Medications on File Prior to  Visit  Medication Sig   ACCU-CHEK SOFTCLIX LANCETS lancets CHECK BLOOD SUGER UP TO 3 TIMES A DAY   aspirin (SB LOW DOSE ASA EC) 81 MG EC tablet Take      1 tablet        Daily   Cholecalciferol (VITAMIN D3) 125 MCG (5000 UT) CAPS Take      1 capsule     2 x /day   DULoxetine (CYMBALTA) 30 MG capsule Take  1 capsule  Daily  for Mood & Chronic Pain   furosemide (LASIX) 40 MG tablet Take  1 tablet 2 x /day for Fluid Retention /Ankle Swelling   insulin NPH-regular Human (NOVOLIN 70/30 RELION) (70-30) 100 UNIT/ML injection Inject  50 Units  2 x /day  before Meals as Directed  for  Diabetes ( Max 100 units /Day)   Insulin Pen Needle (PEN NEEDLES 31GX5/16") 31G X 8 MM MISC USE 4 TIMES A DAY   levothyroxine (SYNTHROID) 100 MCG tablet Take  1 tablet  Daily  on an empty stomach with only water for 30 minutes & no Antacid meds, Calcium or Magnesium for 4 hours & avoid Biotin   losartan (COZAAR) 100 MG tablet Take  1 tablet  Daily  for BP   Magnesium 400 MG CAPS Take      1 capsule       Daily   MegaRed Omega-3 Krill Oil 500 MG CAPS Take      1 capsule      2 x /day         for Triglycerides  (Blood Fats)   metFORMIN (GLUCOPHAGE) 1000 MG tablet TAKE (1) TABLET TWICE A DAY WITH MEALS (BREAKFAST AND SUPPER)   Multiple Vitamins-Minerals (ICAPS) CAPS Takle     1 tablet     Daily for Eyes   rosuvastatin (CRESTOR) 5 MG tablet Take  1 tablet  Daily  for Cholesterol   ciprofloxacin (CIPRO) 250 MG tablet Take      1 tablet       2 x /day       with Food      for Infection (Patient taking differently: Take      1 tablet       2 x /day       with Food      for Infection)   No current facility-administered medications on file prior to visit.     Allergies:  Allergies  Allergen Reactions   Detrol [Tolterodine]      Medical History:  Past Medical History:  Diagnosis Date   Arthritis    GERD (gastroesophageal reflux disease)    HOH (hard of hearing)    Hyperlipidemia    Hypertension    Hypothyroid    IDDM (insulin dependent diabetes mellitus)    Leg swelling    Macular degeneration    Shortness of breath dyspnea    with exertion   Sleep apnea    does not wear CPAP   Varicose veins    Wears glasses    Family history- Reviewed and unchanged Social history- Reviewed and unchanged   Review of Systems  Constitutional:  Positive for malaise/fatigue (sleeps a lot of the day per her son). Negative for chills and fever.  HENT:  Positive for hearing loss. Negative for congestion and sore throat.   Eyes:  Negative for blurred vision and double vision.  Respiratory:  Negative  for cough, shortness of breath and wheezing.   Cardiovascular:  Negative for  chest pain, palpitations, orthopnea and leg swelling.  Gastrointestinal:  Negative for abdominal pain, constipation, diarrhea, heartburn, nausea and vomiting.  Genitourinary:  Negative for dysuria.  Musculoskeletal:  Negative for back pain and joint pain.  Skin:  Negative for rash.  Neurological:  Negative for dizziness.  Psychiatric/Behavioral:  Positive for memory loss. Negative for depression.        Physical Exam: BP 118/68   Pulse 67   Temp 97.7 F (36.5 C)   Wt 203 lb 9.6 oz (92.4 kg)   SpO2 96%   BMI 37.24 kg/m  Wt Readings from Last 3 Encounters:  05/21/21 203 lb 9.6 oz (92.4 kg)  04/05/21 202 lb (91.6 kg)  02/13/21 208 lb 9.6 oz (94.6 kg)   General Appearance: Well nourished, in no apparent distress. Eyes: PERRLA, EOMs, conjunctiva no swelling or erythema Sinuses: No Frontal/maxillary tenderness ENT/Mouth: Ext aud canals clear, TMs without erythema, bulging. No erythema, swelling, or exudate on post pharynx.  Tonsils not swollen or erythematous. Hearing normal.  Neck: Supple, thyroid normal.  Respiratory: Respiratory effort normal, BS equal bilaterally without rales, rhonchi, wheezing or stridor.  Cardio: RRR with no MRGs. Brisk peripheral pulses without 1+ nonpitting edema of feet bilaterally Abdomen: Soft, + BS.  Non tender, no guarding, rebound, hernias, masses. Lymphatics: Non tender without lymphadenopathy.  Musculoskeletal: Full ROM, 5/5 strength, Ambulates with cane/walker  Skin: Warm, dry without rashes, lesions, ecchymosis.  Neuro: Cranial nerves intact. No cerebellar symptoms.  Psych: Awake and oriented X 1, normal affect, Son is present and answers review of system questions   Joon Pohle Kathyrn Drown, NP 3:01 PM [facility]

## 2021-05-22 LAB — CBC WITH DIFFERENTIAL/PLATELET
Absolute Monocytes: 1272 cells/uL — ABNORMAL HIGH (ref 200–950)
Basophils Absolute: 60 cells/uL (ref 0–200)
Basophils Relative: 0.5 %
Eosinophils Absolute: 432 cells/uL (ref 15–500)
Eosinophils Relative: 3.6 %
HCT: 37.2 % (ref 35.0–45.0)
Hemoglobin: 11.8 g/dL (ref 11.7–15.5)
Lymphs Abs: 1872 cells/uL (ref 850–3900)
MCH: 26.8 pg — ABNORMAL LOW (ref 27.0–33.0)
MCHC: 31.7 g/dL — ABNORMAL LOW (ref 32.0–36.0)
MCV: 84.4 fL (ref 80.0–100.0)
MPV: 11.1 fL (ref 7.5–12.5)
Monocytes Relative: 10.6 %
Neutro Abs: 8364 cells/uL — ABNORMAL HIGH (ref 1500–7800)
Neutrophils Relative %: 69.7 %
Platelets: 388 10*3/uL (ref 140–400)
RBC: 4.41 10*6/uL (ref 3.80–5.10)
RDW: 13.1 % (ref 11.0–15.0)
Total Lymphocyte: 15.6 %
WBC: 12 10*3/uL — ABNORMAL HIGH (ref 3.8–10.8)

## 2021-05-22 LAB — LIPID PANEL
Cholesterol: 129 mg/dL (ref <200)
HDL: 69 mg/dL (ref >=50)
LDL Cholesterol (Calc): 44 mg/dL (calc)
Non-HDL Cholesterol (Calc): 60 mg/dL (calc) (ref <130)
Total CHOL/HDL Ratio: 1.9 (calc) (ref <5.0)
Triglycerides: 79 mg/dL (ref <150)

## 2021-05-22 LAB — COMPLETE METABOLIC PANEL WITH GFR
AG Ratio: 1.4 (calc) (ref 1.0–2.5)
ALT: 16 U/L (ref 6–29)
AST: 21 U/L (ref 10–35)
Albumin: 4.1 g/dL (ref 3.6–5.1)
Alkaline phosphatase (APISO): 55 U/L (ref 37–153)
BUN/Creatinine Ratio: 29 (calc) — ABNORMAL HIGH (ref 6–22)
BUN: 27 mg/dL — ABNORMAL HIGH (ref 7–25)
CO2: 36 mmol/L — ABNORMAL HIGH (ref 20–32)
Calcium: 10.1 mg/dL (ref 8.6–10.4)
Chloride: 95 mmol/L — ABNORMAL LOW (ref 98–110)
Creat: 0.93 mg/dL (ref 0.60–0.95)
Globulin: 2.9 g/dL (calc) (ref 1.9–3.7)
Glucose, Bld: 51 mg/dL — ABNORMAL LOW (ref 65–99)
Potassium: 4.1 mmol/L (ref 3.5–5.3)
Sodium: 139 mmol/L (ref 135–146)
Total Bilirubin: 0.4 mg/dL (ref 0.2–1.2)
Total Protein: 7 g/dL (ref 6.1–8.1)
eGFR: 59 mL/min/{1.73_m2} — ABNORMAL LOW (ref >=60)

## 2021-05-22 LAB — IRON, TOTAL/TOTAL IRON BINDING CAP
%SAT: 11 % (calc) — ABNORMAL LOW (ref 16–45)
Iron: 42 ug/dL — ABNORMAL LOW (ref 45–160)
TIBC: 393 mcg/dL (calc) (ref 250–450)

## 2021-05-22 LAB — HEMOGLOBIN A1C
Hgb A1c MFr Bld: 6 % of total Hgb — ABNORMAL HIGH (ref <5.7)
Mean Plasma Glucose: 126 mg/dL
eAG (mmol/L): 7 mmol/L

## 2021-05-22 LAB — FERRITIN: Ferritin: 32 ng/mL (ref 16–288)

## 2021-05-22 LAB — TSH: TSH: 2 mIU/L (ref 0.40–4.50)

## 2021-05-28 ENCOUNTER — Other Ambulatory Visit: Payer: Self-pay

## 2021-05-28 ENCOUNTER — Encounter (INDEPENDENT_AMBULATORY_CARE_PROVIDER_SITE_OTHER): Payer: Medicare Other | Admitting: Ophthalmology

## 2021-05-28 ENCOUNTER — Encounter (INDEPENDENT_AMBULATORY_CARE_PROVIDER_SITE_OTHER): Payer: Self-pay | Admitting: Ophthalmology

## 2021-05-28 ENCOUNTER — Ambulatory Visit (INDEPENDENT_AMBULATORY_CARE_PROVIDER_SITE_OTHER): Payer: Medicare Other | Admitting: Ophthalmology

## 2021-05-28 DIAGNOSIS — H353221 Exudative age-related macular degeneration, left eye, with active choroidal neovascularization: Secondary | ICD-10-CM | POA: Diagnosis not present

## 2021-05-28 DIAGNOSIS — H353211 Exudative age-related macular degeneration, right eye, with active choroidal neovascularization: Secondary | ICD-10-CM | POA: Insufficient documentation

## 2021-05-28 MED ORDER — BEVACIZUMAB 2.5 MG/0.1ML IZ SOSY
2.5000 mg | PREFILLED_SYRINGE | INTRAVITREAL | Status: AC | PRN
  Administered 2021-05-28: 2.5 mg via INTRAVITREAL

## 2021-05-28 NOTE — Assessment & Plan Note (Signed)
Improved overall OS center involved hemorrhage and a CNVM yet stable acuity at 6-week interval.  Repeat injection today and examination next in 6 weeks

## 2021-05-28 NOTE — Assessment & Plan Note (Signed)
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

## 2021-05-28 NOTE — Progress Notes (Signed)
05/28/2021     CHIEF COMPLAINT Patient presents for  Chief Complaint  Patient presents with   Retina Follow Up    7 month fu OS and Avastin OS Pt states VA OU stable since last visit. Pt denies FOL, floaters, or ocular pain OU.  A1C: 7.1 LBS: Unknown       HISTORY OF PRESENT ILLNESS: Catherine Wood is a 87 y.o. female who presents to the clinic today for:   HPI     Retina Follow Up   Patient presents with  Wet AMD.  In left eye.  This started 6 months ago.  Severity is mild.  Duration of 6 months.  Since onset it is stable. Additional comments: 7 month fu OS and Avastin OS Pt states VA OU stable since last visit. Pt denies FOL, floaters, or ocular pain OU.  A1C: 7.1 LBS: Unknown         Comments   6 week fu ou oct fp, avastin os. Patient states vision is stable and unchanged since last visit. Denies any new floaters or FOL.       Last edited by Laurin Coder on 05/28/2021  1:43 PM.      Referring physician: [name], MD [address] [place],  [place] [address]  HISTORICAL INFORMATION:   Selected notes from the MEDICAL RECORD NUMBER    Lab Results  Component Value Date   HGBA1C 6.0 (H) 05/21/2021     CURRENT MEDICATIONS: No current outpatient medications on file. (Ophthalmic Drugs)   No current facility-administered medications for this visit. (Ophthalmic Drugs)   Current Outpatient Medications (Other)  Medication Sig   ACCU-CHEK SOFTCLIX LANCETS lancets CHECK BLOOD SUGER UP TO 3 TIMES A DAY   aspirin (SB LOW DOSE ASA EC) 81 MG EC tablet Take      1 tablet        Daily   Cholecalciferol (VITAMIN D3) 125 MCG (5000 UT) CAPS Take      1 capsule     2 x /day   DULoxetine (CYMBALTA) 30 MG capsule Take  1 capsule  Daily  for Mood & Chronic Pain   furosemide (LASIX) 40 MG tablet Take  1 tablet 2 x /day for Fluid Retention /Ankle Swelling   insulin NPH-regular Human (NOVOLIN 70/30 RELION) (70-30) 100 UNIT/ML injection Inject  50  Units  2 x /day  before Meals as Directed  for Diabetes ( Max 100 units /Day)   Insulin Pen Needle (PEN NEEDLES 31GX5/16") 31G X 8 MM MISC USE 4 TIMES A DAY   levothyroxine (SYNTHROID) 100 MCG tablet Take  1 tablet  Daily  on an empty stomach with only water for 30 minutes & no Antacid meds, Calcium or Magnesium for 4 hours & avoid Biotin   losartan (COZAAR) 100 MG tablet Take  1 tablet  Daily  for BP   Magnesium 400 MG CAPS Take      1 capsule       Daily   MegaRed Omega-3 Krill Oil 500 MG CAPS Take      1 capsule      2 x /day         for Triglycerides  (Blood Fats)   metFORMIN (GLUCOPHAGE) 1000 MG tablet TAKE (1) TABLET TWICE A DAY WITH MEALS (BREAKFAST AND SUPPER)   Multiple Vitamins-Minerals (ICAPS) CAPS Takle     1 tablet     Daily for Eyes   rosuvastatin (CRESTOR) 5 MG tablet  Take  1 tablet  Daily  for Cholesterol   No current facility-administered medications for this visit. (Other)      REVIEW OF SYSTEMS:    ALLERGIES Allergies  Allergen Reactions   Detrol [Tolterodine]     PAST MEDICAL HISTORY Past Medical History:  Diagnosis Date   Arthritis    GERD (gastroesophageal reflux disease)    HOH (hard of hearing)    Hyperlipidemia    Hypertension    Hypothyroid    IDDM (insulin dependent diabetes mellitus)    Leg swelling    Macular degeneration    Shortness of breath dyspnea    with exertion   Sleep apnea    does not wear CPAP   Varicose veins    Wears glasses    Past Surgical History:  Procedure Laterality Date   ANTERIOR CERVICAL DECOMP/DISCECTOMY FUSION N/A 11/08/2014   Procedure: Cervical three-four,  Cervical six-seven anterior cervical decompression with fusion plating and bonegraft;  Surgeon: Ashok Pall, MD;  Location: [facility];  Service: Neurosurgery;  Laterality: N/A;  Cervical three-four,  Cervical six-seven anterior cervical decompression with fusion plating and bonegraft   BIOPSY THYROID     benign thyroid nodule removal   CATARACT EXTRACTION,  BILATERAL     COLONOSCOPY W/ BIOPSIES AND POLYPECTOMY     ENDOMETRIAL BIOPSY  2000   SPINE SURGERY Left  11/08/2014   Left ACDF    TUBAL LIGATION     VARICOSE VEIN SURGERY      FAMILY HISTORY Family History  Problem Relation Age of Onset   Cancer Mother        colon   Cancer Father        GI   Breast cancer Neg Hx     SOCIAL HISTORY Social History   Tobacco Use   Smoking status: Never   Smokeless tobacco: Never  Substance Use Topics   Alcohol use: No   Drug use: No         OPHTHALMIC EXAM:  Base Eye Exam     Visual Acuity (ETDRS)       Right Left   Dist cc HM 20/70 -1+2   Dist ph cc  NI         Tonometry (Tonopen, 1:45 PM)       Right Left   Pressure 21 18         Pupils       Pupils Dark Light React APD   Right PERRL 5 4 Slow None   Left PERRL 4 4 Minimal None         Extraocular Movement       Right Left    Full Full         Neuro/Psych     Oriented x3: Yes   Mood/Affect: Normal         Dilation     Both eyes: 1.0% Mydriacyl, 2.5% Phenylephrine @ 1:46 PM           Slit Lamp and Fundus Exam     External Exam       Right Left   External Normal Normal         Slit Lamp Exam       Right Left   Lids/Lashes Normal Normal   Conjunctiva/Sclera White and quiet White and quiet   Cornea Clear Clear   Anterior Chamber Deep and quiet Deep and quiet   Iris Round and reactive Round and reactive   Lens Posterior chamber intraocular  lens Posterior chamber intraocular lens   Anterior Vitreous Normal Normal         Fundus Exam       Right Left   Posterior Vitreous Posterior vitreous detachment Posterior vitreous detachment   Disc peripapillary hemorrhage Normal   C/D Ratio 0.4 0.3   Macula Geographic atrophy subfoveal, Subretinal hemorrhage, along the inferotemporal arcade Retinal pigment epithelial atrophy, Geographic atrophy, Advanced age related macular degeneration,Microaneurysms, Subretinal neovascular membrane,  Subretinal hemorrhage large inferior to the fovea and along the inferotemporal arcade    Vessels Normal Normal   Periphery Normal Normal            IMAGING AND PROCEDURES  Imaging and Procedures for 05/28/21  OCT, Retina - OU - Both Eyes       Right Eye Quality was good. Scan locations included subfoveal. Central Foveal Thickness: 223. Progression has been stable. Findings include subretinal scarring, disciform scar, abnormal foveal contour, no SRF, no IRF.   Left Eye Quality was good. Scan locations included subfoveal. Central Foveal Thickness: 335. Progression has worsened. Findings include cystoid macular edema, intraretinal hyper-reflective material, subretinal fluid, intraretinal fluid.   Notes Subretinal scarring, disciform scar OD, now with intraretinal subretinal hemorrhage inferiorly, new OD and will need injection OD today  OS large amount of CME and intraretinal fluid improved today currently at 6-week interval post Avastin repeat injection today OS     Color Fundus Photography Optos - OU - Both Eyes       Right Eye Progression has worsened. Macula : hemorrhage, geographic atrophy. Vessels : normal observations. Periphery : normal observations.   Left Eye Progression has been stable. Disc findings include normal observations. Macula : hemorrhage, retinal pigment epithelium abnormalities, geographic atrophy. Vessels : normal observations.   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 2:40 PM. Confirmed correct patient, procedure, site, and patient consented.   Anesthesia Topical anesthesia was used. Anesthetic medications included  [place] 3.5%.   Procedure Preparation included Ofloxacin , Tobramycin 0.3%, 10% betadine to eyelids, 5% betadine to ocular surface. A 30 gauge needle was used.   Injection: 2.5 mg bevacizumab 2.5 MG/0.1ML   Route: Intravitreal, Site: Left Eye   NDC: [phone], Lot: [id number]   Post-op Post injection exam found visual acuity of at least counting fingers. The patient tolerated the procedure well. There were no complications. The patient received written and verbal post procedure care education. Post injection medications included ocuflox.      Intravitreal Injection, Pharmacologic Agent - OD - Right Eye       Time Out 05/28/2021. 2:40 PM. Confirmed correct patient, procedure, site, and patient consented.   Anesthesia Topical anesthesia was used. Anesthetic medications included [place] 3.5%.   Procedure Preparation included Tobramycin 0.3%, 10% betadine to eyelids, 5% betadine to ocular surface. A 30 gauge needle was used.   Injection: 2.5 mg bevacizumab 2.5 MG/0.1ML   Route: Intravitreal, Site: Right Eye   NDC: [phone], [id number]              ASSESSMENT/PLAN:  Exudative age-related macular degeneration of left eye with active choroidal neovascularization (HCC) Improved overall OS center involved hemorrhage and a CNVM yet stable acuity at 6-week interval.  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 Exudative age-related macular degeneration of left eye with active choroidal neovascularization (HCC)  H35.3221 OCT, Retina - OU - Both Eyes    Color Fundus Photography Optos - OU - Both Eyes    Intravitreal Injection, Pharmacologic Agent - OS - Left Eye    bevacizumab  (AVASTIN) SOSY 2.5 mg    2. Exudative age-related macular degeneration of right eye with active choroidal neovascularization (HCC)  H35.3211 Intravitreal Injection, Pharmacologic Agent - OD - Right Eye    bevacizumab (AVASTIN) SOSY 2.5 mg      1.  OU now with active CNVM and new onset subretinal hemorrhage most recently OD.  Repeat injection OS today to control and stabilize acuity as has been done at 6-week interval   and add therapy OD today and likely therapy and evaluation right eye in 3 months  2.  Injection intravitreal Avastin OS today,, and follow-up OS in 6 weeks  3.  Commence injection Avastin OD today follow-up OD in 3 months  Ophthalmic Meds Ordered this visit:  Meds ordered this encounter  Medications   bevacizumab (AVASTIN) SOSY 2.5 mg   bevacizumab (AVASTIN) SOSY 2.5 mg       Return in about 6 weeks (around 07/09/2021) for dilate, AVASTIN OCT,,, and OU dilate OU 3 months Avastin OCT.  There are no Patient Instructions on file for this visit.   Explained the diagnoses, plan, and follow up with the patient and they expressed understanding.  Patient expressed understanding of the importance of proper follow up care.   Clent Demark Severino Paolo M.D. Diseases & Surgery of the Retina and Vitreous Retina & Diabetic [place] 05/28/21     Abbreviations: M myopia (nearsighted); A astigmatism; H hyperopia (farsighted); P presbyopia; Mrx spectacle prescription;  CTL contact lenses; OD right eye; OS left eye; OU both eyes  XT exotropia; ET esotropia; PEK punctate epithelial keratitis; PEE punctate epithelial erosions; DES dry eye syndrome; MGD meibomian gland dysfunction; ATs artificial tears; PFAT's preservative free artificial tears; [place] nuclear sclerotic cataract; PSC posterior subcapsular cataract; ERM epi-retinal membrane; PVD posterior vitreous detachment; RD retinal detachment; DM diabetes mellitus; DR diabetic retinopathy; NPDR non-proliferative diabetic retinopathy; PDR  proliferative diabetic retinopathy; CSME clinically significant macular edema; DME diabetic macular edema; dbh dot blot hemorrhages; CWS cotton wool spot; POAG primary open angle glaucoma; C/D cup-to-disc ratio; HVF humphrey visual field; GVF goldmann visual field; OCT optical coherence tomography; IOP intraocular pressure; BRVO Branch retinal vein occlusion; CRVO central retinal vein occlusion; CRAO central retinal artery occlusion; BRAO branch retinal artery occlusion; RT retinal tear; SB scleral buckle; PPV pars plana vitrectomy; VH Vitreous hemorrhage; PRP panretinal laser photocoagulation; IVK intravitreal kenalog; VMT vitreomacular traction; MH Macular hole;  NVD neovascularization of the disc; NVE neovascularization elsewhere; AREDS age related eye disease study; ARMD age related macular degeneration; POAG primary open angle glaucoma; EBMD epithelial/anterior basement membrane dystrophy; ACIOL anterior chamber intraocular lens; IOL intraocular lens; PCIOL posterior chamber intraocular lens; Phaco/IOL phacoemulsification with intraocular lens placement; [place] photorefractive keratectomy; LASIK laser assisted in situ keratomileusis; HTN hypertension; DM diabetes mellitus; COPD chronic obstructive pulmonary disease

## 2021-05-29 DIAGNOSIS — D0462 Carcinoma in situ of skin of left upper limb, including shoulder: Secondary | ICD-10-CM | POA: Diagnosis not present

## 2021-05-29 DIAGNOSIS — C4402 Squamous cell carcinoma of skin of lip: Secondary | ICD-10-CM | POA: Diagnosis not present

## 2021-06-12 DIAGNOSIS — D485 Neoplasm of uncertain behavior of skin: Secondary | ICD-10-CM | POA: Diagnosis not present

## 2021-06-12 DIAGNOSIS — D0462 Carcinoma in situ of skin of left upper limb, including shoulder: Secondary | ICD-10-CM | POA: Diagnosis not present

## 2021-06-12 DIAGNOSIS — L821 Other seborrheic keratosis: Secondary | ICD-10-CM | POA: Diagnosis not present

## 2021-06-12 DIAGNOSIS — C44722 Squamous cell carcinoma of skin of right lower limb, including hip: Secondary | ICD-10-CM | POA: Diagnosis not present

## 2021-06-21 DIAGNOSIS — M5416 Radiculopathy, lumbar region: Secondary | ICD-10-CM | POA: Diagnosis not present

## 2021-06-28 DIAGNOSIS — M79676 Pain in unspecified toe(s): Secondary | ICD-10-CM | POA: Diagnosis not present

## 2021-06-28 DIAGNOSIS — B351 Tinea unguium: Secondary | ICD-10-CM | POA: Diagnosis not present

## 2021-06-28 DIAGNOSIS — E1151 Type 2 diabetes mellitus with diabetic peripheral angiopathy without gangrene: Secondary | ICD-10-CM | POA: Diagnosis not present

## 2021-06-28 DIAGNOSIS — L84 Corns and callosities: Secondary | ICD-10-CM | POA: Diagnosis not present

## 2021-07-09 ENCOUNTER — Ambulatory Visit (INDEPENDENT_AMBULATORY_CARE_PROVIDER_SITE_OTHER): Payer: Medicare Other | Admitting: Ophthalmology

## 2021-07-09 ENCOUNTER — Encounter (INDEPENDENT_AMBULATORY_CARE_PROVIDER_SITE_OTHER): Payer: Self-pay | Admitting: Ophthalmology

## 2021-07-09 ENCOUNTER — Other Ambulatory Visit: Payer: Self-pay

## 2021-07-09 DIAGNOSIS — H353211 Exudative age-related macular degeneration, right eye, with active choroidal neovascularization: Secondary | ICD-10-CM

## 2021-07-09 DIAGNOSIS — H353221 Exudative age-related macular degeneration, left eye, with active choroidal neovascularization: Secondary | ICD-10-CM | POA: Diagnosis not present

## 2021-07-09 MED ORDER — BEVACIZUMAB 2.5 MG/0.1ML IZ SOSY
2.5000 mg | PREFILLED_SYRINGE | INTRAVITREAL | Status: AC | PRN
  Administered 2021-07-09: 2.5 mg via INTRAVITREAL

## 2021-07-09 NOTE — Assessment & Plan Note (Signed)
Today OS at 6-week interval and region inferotemporal arcade of subretinal hemorrhage from CNVM is smaller and not encroaching upon fovea.  Repeat injection today to maintain excellent acuity left eye

## 2021-07-09 NOTE — Addendum Note (Signed)
Addended by: Deloria Lair A on: 07/09/2021 04:49 PM   Modules accepted: Orders

## 2021-07-09 NOTE — Progress Notes (Addendum)
07/09/2021     CHIEF COMPLAINT Patient presents for  Chief Complaint  Patient presents with   Retina Follow Up      HISTORY OF PRESENT ILLNESS: Catherine Wood is a 87 y.o. female who presents to the clinic today for:   HPI     Retina Follow Up   Patient presents with  Wet AMD.  In left eye.  This started 6 weeks ago.  Duration of 6 weeks.  Since onset it is stable.        Comments   Per pt's son, she thinks it has snowed every morning she wakes up, because of how bright everything is for her.       Last edited by Reather Littler, COA on 07/09/2021  1:58 PM.      Referring physician: [name], MD [address] [place],   [address]  HISTORICAL INFORMATION:   Selected notes from the MEDICAL RECORD NUMBER    Lab Results  Component Value Date   HGBA1C 6.0 (H) 05/21/2021     CURRENT MEDICATIONS: No current outpatient medications on file. (Ophthalmic Drugs)   No current facility-administered medications for this visit. (Ophthalmic Drugs)   Current Outpatient Medications (Other)  Medication Sig   ACCU-CHEK SOFTCLIX LANCETS lancets CHECK BLOOD SUGER UP TO 3 TIMES A DAY   aspirin (SB LOW DOSE ASA EC) 81 MG EC tablet Take      1 tablet        Daily   Cholecalciferol (VITAMIN D3) 125 MCG (5000 UT) CAPS Take      1 capsule     2 x /day   DULoxetine (CYMBALTA) 30 MG capsule Take  1 capsule  Daily  for Mood & Chronic Pain   furosemide (LASIX) 40 MG tablet Take  1 tablet 2 x /day for Fluid Retention /Ankle Swelling   insulin NPH-regular Human (NOVOLIN 70/30 RELION) (70-30) 100 UNIT/ML injection Inject  50 Units  2 x /day  before Meals as Directed  for Diabetes ( Max 100 units /Day)   Insulin Pen Needle (PEN NEEDLES 31GX5/16") 31G X 8 MM MISC USE 4 TIMES A DAY   levothyroxine (SYNTHROID) 100 MCG tablet Take  1 tablet  Daily  on an empty stomach with only water for 30 minutes & no Antacid meds, Calcium or Magnesium for 4 hours & avoid Biotin    losartan (COZAAR) 100 MG tablet Take  1 tablet  Daily  for BP   Magnesium 400 MG CAPS Take      1 capsule       Daily   MegaRed Omega-3 Krill Oil 500 MG CAPS Take      1 capsule      2 x /day         for Triglycerides  (Blood Fats)   metFORMIN (GLUCOPHAGE) 1000 MG tablet TAKE (1) TABLET TWICE A DAY WITH MEALS (BREAKFAST AND SUPPER)   Multiple Vitamins-Minerals (ICAPS) CAPS Takle     1 tablet     Daily for Eyes   rosuvastatin (CRESTOR) 5 MG tablet Take  1 tablet  Daily  for Cholesterol   No current facility-administered medications for this visit. (Other)      REVIEW OF SYSTEMS:    ALLERGIES Allergies  Allergen Reactions   Detrol [Tolterodine]     PAST MEDICAL HISTORY Past Medical History:  Diagnosis Date   Arthritis    GERD (gastroesophageal reflux disease)    HOH (hard of hearing)  Hyperlipidemia    Hypertension    Hypothyroid    IDDM (insulin dependent diabetes mellitus)    Leg swelling    Macular degeneration    Shortness of breath dyspnea    with exertion   Sleep apnea    does not wear CPAP   Varicose veins    Wears glasses    Past Surgical History:  Procedure Laterality Date   ANTERIOR CERVICAL DECOMP/DISCECTOMY FUSION N/A 11/08/2014   Procedure: Cervical three-four,  Cervical six-seven anterior cervical decompression with fusion plating and bonegraft;  Surgeon: Ashok Pall, MD;  Location: [facility];  Service: Neurosurgery;  Laterality: N/A;  Cervical three-four,  Cervical six-seven anterior cervical decompression with fusion plating and bonegraft   BIOPSY THYROID     benign thyroid nodule removal   CATARACT EXTRACTION, BILATERAL     COLONOSCOPY W/ BIOPSIES AND POLYPECTOMY     ENDOMETRIAL BIOPSY  2000   SPINE SURGERY Left  11/08/2014   Left ACDF    TUBAL LIGATION     VARICOSE VEIN SURGERY      FAMILY HISTORY Family History  Problem Relation Age of Onset   Cancer Mother        colon   Cancer Father        GI   Breast cancer Neg Hx      SOCIAL HISTORY Social History   Tobacco Use   Smoking status: Never   Smokeless tobacco: Never  Substance Use Topics   Alcohol use: No   Drug use: No         OPHTHALMIC EXAM:  Base Eye Exam     Visual Acuity (Snellen - Linear)       Right Left   Dist cc CF@1ft  20/63 +1    Correction: Glasses         Tonometry (Tonopen, 2:06 PM)       Right Left   Pressure 20 20         Pupils       Dark Light Shape React APD   Right 5 4 Round Minimal None   Left 4 3 Round Minimal None         Visual Fields (Counting fingers)       Left Right    Full Full         Extraocular Movement       Right Left    Full, Ortho Full, Ortho         Neuro/Psych     Oriented x3: Yes   Mood/Affect: Normal         Dilation     Left eye: 1.0% Mydriacyl, 2.5% Phenylephrine @ 2:06 PM           Slit Lamp and Fundus Exam     External Exam       Right Left   External Normal Normal         Slit Lamp Exam       Right Left   Lids/Lashes Normal Normal   Conjunctiva/Sclera White and quiet White and quiet   Cornea Clear Clear   Anterior Chamber Deep and quiet Deep and quiet   Iris Round and reactive Round and reactive   Lens Posterior chamber intraocular lens Posterior chamber intraocular lens   Anterior Vitreous Normal Normal         Fundus Exam       Right Left   Posterior Vitreous  Posterior vitreous detachment   Disc  Normal   C/D  Ratio  0.3   Macula  Retinal pigment epithelial atrophy, Geographic atrophy, Advanced age related macular degeneration,Microaneurysms, Subretinal neovascular membrane, Subretinal hemorrhage smaller, still active, inferior to the fovea and along the inferotemporal arcade    Vessels  Normal   Periphery  Normal            IMAGING AND PROCEDURES  Imaging and Procedures for 07/09/21  OCT, Retina - OU - Both Eyes       Right Eye Quality was good. Scan locations included subfoveal. Central Foveal Thickness:  224. Progression has been stable. Findings include subretinal scarring, disciform scar, abnormal foveal contour, no SRF, no IRF.   Left Eye Quality was good. Scan locations included subfoveal. Central Foveal Thickness: 276. Progression has worsened. Findings include cystoid macular edema, intraretinal hyper-reflective material, subretinal fluid, intraretinal fluid.   Notes OD with chronic disciform scar  OS large amount of CME and intraretinal fluid improved today currently at 6-week interval post Avastin repeat injection today OS, vastly improved no extension towards the fovea     Intravitreal Injection, Pharmacologic Agent - OS - Left Eye       Time Out 07/09/2021. 4:48 PM. Confirmed correct patient, procedure, site, and patient consented.   Anesthesia Topical anesthesia was used. Anesthetic medications included Lidocaine 4%.   Procedure Preparation included Ofloxacin , Tobramycin 0.3%, 10% betadine to eyelids, 5% betadine to ocular surface. A 30 gauge needle was used.   Injection: 2.5 mg bevacizumab 2.5 MG/0.1ML   Route: Intravitreal, Site: Left Eye   NDC: [id number], Lot: [id number]   Post-op Post injection exam found visual acuity of at least counting fingers. The patient tolerated the procedure well. There were no complications. The patient received written and verbal post procedure care education. Post injection medications included ocuflox.              ASSESSMENT/PLAN:  Exudative age-related macular degeneration of left eye with active choroidal neovascularization (HCC) Today OS at 6-week interval and region inferotemporal arcade of subretinal hemorrhage from CNVM is smaller and not encroaching upon fovea.  Repeat injection today to maintain excellent acuity left eye  Exudative age-related macular degeneration of right eye with active choroidal neovascularization (HCC) Right eye with chronic macular hole but also disciform scar nasal to the FAZ.  And also 6 weeks  postinjection.  Follow-up dilate OD in 6 weeks in conjunction with a left eye evaluation     ICD-10-CM   1. Exudative age-related macular degeneration of left eye with active choroidal neovascularization (HCC)  H35.3221 OCT, Retina - OU - Both Eyes    Intravitreal Injection, Pharmacologic Agent - OS - Left Eye    bevacizumab (AVASTIN) SOSY 2.5 mg    2. Exudative age-related macular degeneration of right eye with active choroidal neovascularization (HCC)  H35.3211       1.  OS, still active but smaller subretinal hemorrhage along the inferotemporal arcade which was previously threatening to [place].  Now stable at 6 weeks post Avastin.  Repeat injection today and examination next OS in 6 weeks  2.  OD with chronic disciform scar line along the p.m. bundle, with acuity limited by prior chronic macular hole.  6 weeks postinjection right eye, and follow-up dilate OD next as well as OS in 6 weeks  3.  Dilate OU next likely injection OU next  Ophthalmic Meds Ordered this visit:  Meds ordered this encounter  Medications   bevacizumab (AVASTIN) SOSY 2.5 mg        Return in  about 6 weeks (around 08/20/2021) for DILATE OU, AVASTIN OCT, OD, OS.  There are no Patient Instructions on file for this visit.   Explained the diagnoses, plan, and follow up with the patient and they expressed understanding.  Patient expressed understanding of the importance of proper follow up care.   Clent Demark Jayah Balthazar M.D. Diseases & Surgery of the Retina and Vitreous Retina & Diabetic [place] 07/09/21     Abbreviations: M myopia (nearsighted); A astigmatism; H hyperopia (farsighted); P presbyopia; Mrx spectacle prescription;  CTL contact lenses; OD right eye; OS left eye; OU both eyes  XT exotropia; ET esotropia; PEK punctate epithelial keratitis; PEE punctate epithelial erosions; DES dry eye syndrome; MGD meibomian gland dysfunction; ATs artificial tears; PFAT's preservative free artificial tears; [place] nuclear  sclerotic cataract; PSC posterior subcapsular cataract; ERM epi-retinal membrane; PVD posterior vitreous detachment; RD retinal detachment; DM diabetes mellitus; DR diabetic retinopathy; NPDR non-proliferative diabetic retinopathy; PDR proliferative diabetic retinopathy; CSME clinically significant macular edema; DME diabetic macular edema; dbh dot blot hemorrhages; CWS cotton wool spot; POAG primary open angle glaucoma; C/D cup-to-disc ratio; HVF humphrey visual field; GVF goldmann visual field; OCT optical coherence tomography; IOP intraocular pressure; BRVO Branch retinal vein occlusion; CRVO central retinal vein occlusion; CRAO central retinal artery occlusion; BRAO branch retinal artery occlusion; RT retinal tear; SB scleral buckle; PPV pars plana vitrectomy; VH Vitreous hemorrhage; PRP panretinal laser photocoagulation; IVK intravitreal kenalog; VMT vitreomacular traction; MH Macular hole;  NVD neovascularization of the disc; NVE neovascularization elsewhere; AREDS age related eye disease study; ARMD age related macular degeneration; POAG primary open angle glaucoma; EBMD epithelial/anterior basement membrane dystrophy; ACIOL anterior chamber intraocular lens; IOL intraocular lens; PCIOL posterior chamber intraocular lens; Phaco/IOL phacoemulsification with intraocular lens placement; [place] photorefractive keratectomy; LASIK laser assisted in situ keratomileusis; HTN hypertension; DM diabetes mellitus; COPD chronic obstructive pulmonary disease

## 2021-07-09 NOTE — Assessment & Plan Note (Signed)
Right eye with chronic macular hole but also disciform scar nasal to the FAZ.  And also 6 weeks postinjection.  Follow-up dilate OD in 6 weeks in conjunction with a left eye evaluation

## 2021-07-12 DIAGNOSIS — C44722 Squamous cell carcinoma of skin of right lower limb, including hip: Secondary | ICD-10-CM | POA: Diagnosis not present

## 2021-07-12 DIAGNOSIS — L570 Actinic keratosis: Secondary | ICD-10-CM | POA: Diagnosis not present

## 2021-08-20 ENCOUNTER — Encounter: Payer: Self-pay | Admitting: Internal Medicine

## 2021-08-20 ENCOUNTER — Other Ambulatory Visit: Payer: Self-pay

## 2021-08-20 ENCOUNTER — Ambulatory Visit (INDEPENDENT_AMBULATORY_CARE_PROVIDER_SITE_OTHER): Payer: Medicare Other | Admitting: Internal Medicine

## 2021-08-20 VITALS — BP 138/70 | HR 75 | Temp 97.9°F | Resp 16 | Ht 62.0 in | Wt 202.6 lb

## 2021-08-20 DIAGNOSIS — E559 Vitamin D deficiency, unspecified: Secondary | ICD-10-CM | POA: Diagnosis not present

## 2021-08-20 DIAGNOSIS — Z79899 Other long term (current) drug therapy: Secondary | ICD-10-CM

## 2021-08-20 DIAGNOSIS — E1142 Type 2 diabetes mellitus with diabetic polyneuropathy: Secondary | ICD-10-CM

## 2021-08-20 DIAGNOSIS — I10 Essential (primary) hypertension: Secondary | ICD-10-CM | POA: Diagnosis not present

## 2021-08-20 DIAGNOSIS — H353231 Exudative age-related macular degeneration, bilateral, with active choroidal neovascularization: Secondary | ICD-10-CM

## 2021-08-20 DIAGNOSIS — E1122 Type 2 diabetes mellitus with diabetic chronic kidney disease: Secondary | ICD-10-CM | POA: Diagnosis not present

## 2021-08-20 DIAGNOSIS — Z136 Encounter for screening for cardiovascular disorders: Secondary | ICD-10-CM | POA: Diagnosis not present

## 2021-08-20 DIAGNOSIS — Z23 Encounter for immunization: Secondary | ICD-10-CM

## 2021-08-20 DIAGNOSIS — Z6837 Body mass index (BMI) 37.0-37.9, adult: Secondary | ICD-10-CM

## 2021-08-20 DIAGNOSIS — N1832 Chronic kidney disease, stage 3b: Secondary | ICD-10-CM

## 2021-08-20 DIAGNOSIS — E1169 Type 2 diabetes mellitus with other specified complication: Secondary | ICD-10-CM

## 2021-08-20 DIAGNOSIS — Z1211 Encounter for screening for malignant neoplasm of colon: Secondary | ICD-10-CM

## 2021-08-20 DIAGNOSIS — Z0000 Encounter for general adult medical examination without abnormal findings: Secondary | ICD-10-CM | POA: Diagnosis not present

## 2021-08-20 DIAGNOSIS — E039 Hypothyroidism, unspecified: Secondary | ICD-10-CM

## 2021-08-20 DIAGNOSIS — Z0001 Encounter for general adult medical examination with abnormal findings: Secondary | ICD-10-CM

## 2021-08-20 DIAGNOSIS — Z1212 Encounter for screening for malignant neoplasm of rectum: Secondary | ICD-10-CM

## 2021-08-20 DIAGNOSIS — F0150 Vascular dementia without behavioral disturbance: Secondary | ICD-10-CM

## 2021-08-20 NOTE — Progress Notes (Signed)
Annual Screening/Preventative Visit & Comprehensive Evaluation &  Examination  Future Appointments  Date Time Provider Department  08/20/2021  3:00 PM [name], MD [facility]  08/21/2021  1:45 PM Rankin, Clent Demark, MD [facility]  11/08/2021  2:30 PM Liane Comber, NP [facility]        This very nice 87 y.o. WWF presents for a Screening /Preventative Visit & comprehensive evaluation and management of multiple medical co-morbidities.  Patient has been followed for HTN, HLD,  Insulin Requiring T2_DM ,  Hypothyroidism, Vascular Dementia   and Vitamin D Deficiency.  Patient is on Avastin Intravetreal  Inj for Wet Macular Degeneration followed closely by Dr Zadie Rhine.         HTN predates circa  1994. Patient's BP has been controlled at home and patient denies any cardiac symptoms as chest pain, palpitations, shortness of breath, dizziness or ankle swelling. Today's BP is at goal - 138/70 .        Patient's hyperlipidemia is controlled with diet and medications. Patient denies myalgias or other medication SE's. Last lipids were   Lab Results  Component Value Date   CHOL 129 05/21/2021   HDL 69 05/21/2021   LDLCALC 44 05/21/2021   TRIG 79 05/21/2021   CHOLHDL 1.9 05/21/2021         Patient has hx/o T2_NIDDM (2003) W/ckd3B  (gfr 34) She has moved in with her son -Konrad Dolores, her caregiver who supervises her meds and administers /regulates her Insulin.  He monitors her CBG's 2 x /day and covers her with SS Novolin 70/30.   He also reports more recent frequent "lows" of glucose for which he tapers or holds insulin.  Patient's son  denies diabetic polys or paresthesias. Last A1c was near goal :  Lab Results  Component Value Date   HGBA1C 6.0 (H) 05/21/2021                                                        Patient has  been on Thyroid Replacement since she had Goiter  surgery in the 1970's.       Finally, patient has history of Vitamin D Deficiency and last Vitamin D was near goal  (70-100) :  Lab Results  Component Value Date   VD25OH 58 02/13/2021    Current Outpatient Medications on File Prior to Visit  Medication Sig   aspirin 81 MG EC tablet Take 1 tablet   Daily   VITAMIN D 5000 UT) CAPS Take  1 capsule  2 x /day   DULoxetine 30 MG capsule Take  1 capsule  Daily     furosemide 40 MG tablet Take  1 tablet 2 x /day    NOVOLIN 70/30 RELION Inject  50 Units  2 x /day  before Meals as Directed    levothyroxine  100 MCG tablet Take  1 tablet  Daily    losartan 100 MG tablet Take  1 tablet  Daily    Magnesium 400 MG CAPS Take  1 capsule  Daily   MegaRed Omega-3 Krill Oil 500 MG  Take 1 capsule      2 x /day    metFORMIN 1000 MG tablet TAKE 1 TABLET 2 x/day w/Meals   ICAPS Takle  1 tablet  Daily for Eyes   rosuvastatin  5 MG tablet  Take  1 tablet  Daily  for Cholesterol    Allergies  Allergen Reactions   Detrol [Tolterodine]      Past Medical History:  Diagnosis Date   Arthritis    GERD (gastroesophageal reflux disease)    HOH (hard of hearing)    Hyperlipidemia    Hypertension    Hypothyroid    IDDM (insulin dependent diabetes mellitus)    Leg swelling    Macular degeneration    Shortness of breath dyspnea    with exertion   Sleep apnea    does not wear CPAP   Varicose veins    Wears glasses      Health Maintenance  Topic Date Due   COVID-19 Vaccine (1) Never done   Zoster Vaccines- Shingrix (1 of 2) Never done   Pneumonia Vaccine 42+ Years old (2 - PPSV23 if available, else PCV20) 04/06/2015   OPHTHALMOLOGY EXAM  11/28/2020   INFLUENZA VACCINE  04/09/2021   FOOT EXAM  08/06/2021   HEMOGLOBIN A1C  11/18/2021   TETANUS/TDAP  10/20/2024   DEXA SCAN  Completed   HPV VACCINES  Aged Out     Immunization History  Administered Date(s) Administered   DT ( 10/20/2014   Influenza Split 06/04/2017   Influenza, High Dose  07/01/2016, 05/29/2018   Influenza,inj,quad 05/11/2017   Influenza 07/06/2015, 06/09/2019   Pneumococcal -13  04/05/2014   Pneumococcal-23 09/09/2004   Td 09/10/2003   Zoster, Live 09/09/2005    Last Colon - 04/12/2015 - Dr Watt Climes - deferred f/u due to age   Last MGM - 09/19/2020   Past Surgical History:  Procedure Laterality Date   ANTERIOR CERVICAL DECOMP/DISCECTOMY FUSION N/A 11/08/2014   Procedure: Cervical three-four,  Cervical six-seven anterior cervical decompression with fusion plating and bonegraft;  Surgeon: Ashok Pall, MD;  Location: [facility];  Service: Neurosurgery;  Laterality: N/A;  Cervical three-four,  Cervical six-seven anterior cervical decompression with fusion plating and bonegraft   BIOPSY THYROID     benign thyroid nodule removal   CATARACT EXTRACTION, BILATERAL     COLONOSCOPY W/ BIOPSIES AND POLYPECTOMY     ENDOMETRIAL BIOPSY  2000   SPINE SURGERY Left  11/08/2014   Left ACDF    TUBAL LIGATION     VARICOSE VEIN SURGERY       Family History  Problem Relation Age of Onset   Cancer Mother        colon   Cancer Father        GI   Breast cancer Neg Hx      Social History   Tobacco Use   Smoking status: Never   Smokeless tobacco: Never  Substance Use Topics   Alcohol use: No   Drug use: No      ROS Constitutional: Denies fever, chills, weight loss/gain, headaches, insomnia,  night sweats, and change in appetite. Does c/o fatigue. Eyes: Denies redness, blurred vision, diplopia, discharge, itchy, watery eyes.  ENT: Denies discharge, congestion, post nasal drip, epistaxis, sore throat, earache, hearing loss, dental pain, Tinnitus, Vertigo, Sinus pain, snoring.  Cardio: Denies chest pain, palpitations, irregular heartbeat, syncope, dyspnea, diaphoresis, orthopnea, PND, claudication, edema Respiratory: denies cough, dyspnea, DOE, pleurisy, hoarseness, laryngitis, wheezing.  Gastrointestinal: Denies dysphagia, heartburn, reflux, water brash, pain, cramps, nausea, vomiting, bloating, diarrhea, constipation, hematemesis, melena, hematochezia, jaundice,  hemorrhoids Genitourinary: Denies dysuria, frequency, urgency, nocturia, hesitancy, discharge, hematuria, flank pain Breast: Breast lumps, nipple discharge, bleeding.  Musculoskeletal: Denies arthralgia, myalgia, stiffness, Jt. Swelling, pain, limp, and  strain/sprain. Denies falls. Skin: Denies puritis, rash, hives, warts, acne, eczema, changing in skin lesion Neuro: No weakness, tremor, incoordination, spasms, paresthesia, pain Psychiatric: Denies confusion, memory loss, sensory loss. Denies Depression. Endocrine: Denies change in weight, skin, hair change, nocturia, and paresthesia, diabetic polys, visual blurring, hyper / hypo glycemic episodes.  Heme/Lymph: No excessive bleeding, bruising, enlarged lymph nodes.  Physical Exam  BP 138/70   Pulse 75   Temp 97.9 F (36.6 C)   Resp 16   Ht 5\' 2"  (1.575 m)   Wt 202 lb 9.6 oz (91.9 kg)   SpO2 97%   BMI 37.06 kg/m   General Appearance: over nourished  and in no apparent distress.  Eyes: PERRLA, EOMs, conjunctiva no swelling or erythema. Fundi  not well visualized with scattered pigmentation and blot & blot H & E. Sinuses: No frontal/maxillary tenderness ENT/Mouth: EACs patent / TMs  nl. Nares clear without erythema, swelling, mucoid exudates. Oral hygiene is good. No erythema, swelling, or exudate. Tongue normal, non-obstructing. Tonsils not swollen or erythematous. Hearing normal.  Neck: Supple, thyroid not palpable. No bruits, nodes or JVD. Respiratory: Respiratory effort normal.  BS equal and clear bilateral without rales, rhonci, wheezing or stridor. Cardio: Heart sounds are normal with regular rate and rhythm and no murmurs, rubs or gallops. Peripheral pulses are normal and equal bilaterally without edema. No aortic or femoral bruits. Chest: symmetric with normal excursions and percussion. Breasts: Symmetric, without lumps, nipple discharge, retractions, or fibrocystic changes.  Abdomen: Flat, soft with bowel sounds active.  Nontender, no guarding, rebound, hernias, masses, or organomegaly.  Lymphatics: Non tender without lymphadenopathy.  Musculoskeletal: Full ROM all peripheral extremities, joint stability, 5/5 strength, and normal gait. Skin: Warm and dry without rashes, lesions, cyanosis, clubbing or  ecchymosis.  Neuro: Cranial nerves intact, reflexes equal bilaterally. Normal muscle tone, no cerebellar symptoms. Sensation sl decreased in a stocking distribution  to touch, vibratory and Monofilament to the toes bilaterally. Pysch: Alert and oriented  x 1-2 . Pleasant affect. Insight and Judgment poor. ST recall is very poor.    Assessment and Plan  1. Annual Preventative Screening Examination   2. Essential hypertension  - EKG 12-Lead - Urinalysis, Routine w reflex microscopic - Microalbumin / creatinine urine ratio - CBC with Differential/Platelet - COMPLETE METABOLIC PANEL WITH GFR - Magnesium - TSH  3. Hyperlipidemia associated with type 2 diabetes mellitus ([place])  - EKG 12-Lead - Lipid panel - TSH  4. Type 2 diabetes mellitus with stage 3b chronic kidney  disease, with long-term current use of insulin (HCC)  - EKG 12-Lead - Urinalysis, Routine w reflex microscopic - Microalbumin / creatinine urine ratio - PTH, intact and calcium - Hemoglobin A1c  5. Vitamin D deficiency  - VITAMIN D 25 Hydroxy   6. Hypothyroidism  - TSH  7. Vascular dementia without behavioral disturbance (HCC)  - Lipid panel  8. Diabetic polyneuropathy associated with type 2 diabetes mellitus (HCC)  - HM DIABETES FOOT EXAM - LOW EXTREMITY NEUR EXAM DOCUM - Hemoglobin A1c  9. Exudative age-related macular degeneration of  both eyes with active choroidal neovascularization ([place])   10. Screening for colorectal cancer  - POC Hemoccult Bld/Stl   11. Screening for ischemic heart disease  - EKG 12-Lead  12. Obesity (BMI 30.0-34.9)   13. Medication management  - Urinalysis, Routine w reflex  microscopic - Microalbumin / creatinine urine ratio - CBC with Differential/Platelet - COMPLETE METABOLIC PANEL WITH GFR - Magnesium - Lipid panel - TSH -  Hemoglobin A1c - VITAMIN D 25 Hydroxy          Patient was counseled in prudent diet to achieve/maintain BMI less than 25 for weight control, BP monitoring, regular exercise and medications. Discussed med's effects and SE's. Screening labs and tests as requested with regular follow-up as recommended. Over 40 minutes of exam, counseling, chart review and high complex critical decision making was performed.   Kirtland Bouchard, MD

## 2021-08-20 NOTE — Patient Instructions (Signed)

## 2021-08-21 ENCOUNTER — Encounter (INDEPENDENT_AMBULATORY_CARE_PROVIDER_SITE_OTHER): Payer: Medicare Other | Admitting: Ophthalmology

## 2021-08-21 ENCOUNTER — Ambulatory Visit (INDEPENDENT_AMBULATORY_CARE_PROVIDER_SITE_OTHER): Payer: Medicare Other | Admitting: Ophthalmology

## 2021-08-21 ENCOUNTER — Encounter (INDEPENDENT_AMBULATORY_CARE_PROVIDER_SITE_OTHER): Payer: Self-pay | Admitting: Ophthalmology

## 2021-08-21 DIAGNOSIS — H353211 Exudative age-related macular degeneration, right eye, with active choroidal neovascularization: Secondary | ICD-10-CM | POA: Diagnosis not present

## 2021-08-21 DIAGNOSIS — H353221 Exudative age-related macular degeneration, left eye, with active choroidal neovascularization: Secondary | ICD-10-CM | POA: Diagnosis not present

## 2021-08-21 LAB — LIPID PANEL
Cholesterol: 147 mg/dL (ref <200)
HDL: 64 mg/dL (ref >=50)
LDL Cholesterol (Calc): 63 mg/dL (calc)
Non-HDL Cholesterol (Calc): 83 mg/dL (calc) (ref <130)
Total CHOL/HDL Ratio: 2.3 (calc) (ref <5.0)
Triglycerides: 114 mg/dL (ref <150)

## 2021-08-21 LAB — HEMOGLOBIN A1C
Hgb A1c MFr Bld: 6 % of total Hgb — ABNORMAL HIGH (ref <5.7)
Mean Plasma Glucose: 126 mg/dL
eAG (mmol/L): 7 mmol/L

## 2021-08-21 LAB — COMPLETE METABOLIC PANEL WITH GFR
AG Ratio: 1.4 (calc) (ref 1.0–2.5)
ALT: 19 U/L (ref 6–29)
AST: 22 U/L (ref 10–35)
Albumin: 4.1 g/dL (ref 3.6–5.1)
Alkaline phosphatase (APISO): 61 U/L (ref 37–153)
BUN/Creatinine Ratio: 28 (calc) — ABNORMAL HIGH (ref 6–22)
BUN: 31 mg/dL — ABNORMAL HIGH (ref 7–25)
CO2: 36 mmol/L — ABNORMAL HIGH (ref 20–32)
Calcium: 10.3 mg/dL (ref 8.6–10.4)
Chloride: 96 mmol/L — ABNORMAL LOW (ref 98–110)
Creat: 1.1 mg/dL — ABNORMAL HIGH (ref 0.60–0.95)
Globulin: 3 g/dL (calc) (ref 1.9–3.7)
Glucose, Bld: 60 mg/dL — ABNORMAL LOW (ref 65–99)
Potassium: 4.9 mmol/L (ref 3.5–5.3)
Sodium: 142 mmol/L (ref 135–146)
Total Bilirubin: 0.3 mg/dL (ref 0.2–1.2)
Total Protein: 7.1 g/dL (ref 6.1–8.1)
eGFR: 48 mL/min/{1.73_m2} — ABNORMAL LOW (ref >=60)

## 2021-08-21 LAB — URINALYSIS, ROUTINE W REFLEX MICROSCOPIC
Bacteria, UA: NONE SEEN /HPF
Bilirubin Urine: NEGATIVE
Glucose, UA: NEGATIVE
Hgb urine dipstick: NEGATIVE
Hyaline Cast: NONE SEEN /LPF
Ketones, ur: NEGATIVE
Nitrite: NEGATIVE
Protein, ur: NEGATIVE
Specific Gravity, Urine: 1.007 (ref 1.001–1.035)
pH: 7.5 (ref 5.0–8.0)

## 2021-08-21 LAB — CBC WITH DIFFERENTIAL/PLATELET
Absolute Monocytes: 1123 cells/uL — ABNORMAL HIGH (ref 200–950)
Basophils Absolute: 59 cells/uL (ref 0–200)
Basophils Relative: 0.5 %
Eosinophils Absolute: 585 cells/uL — ABNORMAL HIGH (ref 15–500)
Eosinophils Relative: 5 %
HCT: 39.6 % (ref 35.0–45.0)
Hemoglobin: 12.6 g/dL (ref 11.7–15.5)
Lymphs Abs: 2246 cells/uL (ref 850–3900)
MCH: 26.1 pg — ABNORMAL LOW (ref 27.0–33.0)
MCHC: 31.8 g/dL — ABNORMAL LOW (ref 32.0–36.0)
MCV: 82 fL (ref 80.0–100.0)
MPV: 11.5 fL (ref 7.5–12.5)
Monocytes Relative: 9.6 %
Neutro Abs: 7687 cells/uL (ref 1500–7800)
Neutrophils Relative %: 65.7 %
Platelets: 359 10*3/uL (ref 140–400)
RBC: 4.83 10*6/uL (ref 3.80–5.10)
RDW: 14.9 % (ref 11.0–15.0)
Total Lymphocyte: 19.2 %
WBC: 11.7 10*3/uL — ABNORMAL HIGH (ref 3.8–10.8)

## 2021-08-21 LAB — MICROSCOPIC MESSAGE

## 2021-08-21 LAB — MICROALBUMIN / CREATININE URINE RATIO
Creatinine, Urine: 20 mg/dL (ref 20–275)
Microalb Creat Ratio: 120 mcg/mg creat — ABNORMAL HIGH (ref <30)
Microalb, Ur: 2.4 mg/dL

## 2021-08-21 LAB — TSH: TSH: 2.96 mIU/L (ref 0.40–4.50)

## 2021-08-21 LAB — MAGNESIUM: Magnesium: 1.9 mg/dL (ref 1.5–2.5)

## 2021-08-21 LAB — PTH, INTACT AND CALCIUM
Calcium: 10.3 mg/dL (ref 8.6–10.4)
PTH: 12 pg/mL — ABNORMAL LOW (ref 16–77)

## 2021-08-21 LAB — VITAMIN D 25 HYDROXY (VIT D DEFICIENCY, FRACTURES): Vit D, 25-Hydroxy: 54 ng/mL (ref 30–100)

## 2021-08-21 MED ORDER — BEVACIZUMAB 2.5 MG/0.1ML IZ SOSY
2.5000 mg | PREFILLED_SYRINGE | INTRAVITREAL | Status: AC | PRN
  Administered 2021-08-21: 2.5 mg via INTRAVITREAL

## 2021-08-21 MED ORDER — BEVACIZUMAB 2.5 MG/0.1ML IZ SOSY
2.5000 mg | PREFILLED_SYRINGE | INTRAVITREAL | Status: AC | PRN
Start: 2021-08-21 — End: 2021-08-21
  Administered 2021-08-21: 2.5 mg via INTRAVITREAL

## 2021-08-21 NOTE — Progress Notes (Signed)
============================================================ °============================================================ ° °-    Call son - >   Catherine Wood - whom she lives with & who manages her meds  ============================================================ ============================================================  -  PTH is slightly low, but Calcium is normal, So will continue to monitor  ============================================================ ============================================================  -  CBC has Normal Hgb (Red Cell count) - No Anemia - All else is stable  ============================================================ ============================================================  -  Kidney functions look   dehydrated   -  Very important to drink adequate amounts of fluids to prevent permanent damage    -  Recommend drink at least 6 bottles (16 ounces) of fluids /water /day = 96 Oz ~100 oz  -  100 oz = 3,000 cc or 3 liters / day  - >> That's 1 &1/2 bottles of a 2 liter soda bottle /day !  ============================================================ ============================================================  -  Total Chol = 147  -  Excellent   - Very low risk for Heart Attack  / Stroke ============================================================ ============================================================  -  A1c = 6.0% - Excellent   ! ============================================================ ============================================================  -  Vitamin D - OK  ============================================================ ============================================================  -  All Else - CBC - Kidneys - Electrolytes - Liver - Magnesium & Thyroid    - all  Normal /  OK ============================================================ ============================================================

## 2021-08-21 NOTE — Assessment & Plan Note (Signed)
Large disciform scar, with intraretinal fluid less active on treatment.  Repeat injection today

## 2021-08-21 NOTE — Progress Notes (Signed)
08/21/2021     CHIEF COMPLAINT Patient presents for  Chief Complaint  Patient presents with   Retina Follow Up      HISTORY OF PRESENT ILLNESS: Catherine Wood is a 87 y.o. female who presents to the clinic today for:   HPI     Retina Follow Up           Diagnosis: Wet AMD   Laterality: left eye   Onset: 6 weeks ago   Duration: 6 weeks   Course: stable         Comments   6 week fu OU oct avastin OU. Patient states vision is stable and unchanged since last visit. Denies any new floaters or FOL.       Last edited by Laurin Coder on 08/21/2021  1:42 PM.      Referring physician: [name], MD [address] [place],   [id number]  HISTORICAL INFORMATION:   Selected notes from the MEDICAL RECORD NUMBER    Lab Results  Component Value Date   HGBA1C 6.0 (H) 08/20/2021     CURRENT MEDICATIONS: No current outpatient medications on file. (Ophthalmic Drugs)   No current facility-administered medications for this visit. (Ophthalmic Drugs)   Current Outpatient Medications (Other)  Medication Sig   ACCU-CHEK SOFTCLIX LANCETS lancets CHECK BLOOD SUGER UP TO 3 TIMES A DAY   aspirin (SB LOW DOSE ASA EC) 81 MG EC tablet Take      1 tablet        Daily   Cholecalciferol (VITAMIN D3) 125 MCG (5000 UT) CAPS Take      1 capsule     2 x /day   DULoxetine (CYMBALTA) 30 MG capsule Take  1 capsule  Daily  for Mood & Chronic Pain   furosemide (LASIX) 40 MG tablet Take  1 tablet 2 x /day for Fluid Retention /Ankle Swelling   insulin NPH-regular Human (NOVOLIN 70/30 RELION) (70-30) 100 UNIT/ML injection Inject  50 Units  2 x /day  before Meals as Directed  for Diabetes ( Max 100 units /Day)   Insulin Pen Needle (PEN NEEDLES 31GX5/16") 31G X 8 MM MISC USE 4 TIMES A DAY   levothyroxine (SYNTHROID) 100 MCG tablet Take  1 tablet  Daily  on an empty stomach with only water for 30 minutes & no Antacid meds, Calcium or Magnesium for 4 hours & avoid  Biotin   losartan (COZAAR) 100 MG tablet Take  1 tablet  Daily  for BP   Magnesium 400 MG CAPS Take      1 capsule       Daily   MegaRed Omega-3 Krill Oil 500 MG CAPS Take      1 capsule      2 x /day         for Triglycerides  (Blood Fats)   metFORMIN (GLUCOPHAGE) 1000 MG tablet TAKE (1) TABLET TWICE A DAY WITH MEALS (BREAKFAST AND SUPPER)   Multiple Vitamins-Minerals (ICAPS) CAPS Takle     1 tablet     Daily for Eyes   rosuvastatin (CRESTOR) 5 MG tablet Take  1 tablet  Daily  for Cholesterol   No current facility-administered medications for this visit. (Other)      REVIEW OF SYSTEMS:    ALLERGIES Allergies  Allergen Reactions   Detrol [Tolterodine]     PAST MEDICAL HISTORY Past Medical History:  Diagnosis Date   Arthritis    GERD (gastroesophageal reflux disease)  HOH (hard of hearing)    Hyperlipidemia    Hypertension    Hypothyroid    IDDM (insulin dependent diabetes mellitus)    Leg swelling    Macular degeneration    Shortness of breath dyspnea    with exertion   Sleep apnea    does not wear CPAP   Varicose veins    Wears glasses    Past Surgical History:  Procedure Laterality Date   ANTERIOR CERVICAL DECOMP/DISCECTOMY FUSION N/A 11/08/2014   Procedure: Cervical three-four,  Cervical six-seven anterior cervical decompression with fusion plating and bonegraft;  Surgeon: Ashok Pall, MD;  Location: [facility];  Service: Neurosurgery;  Laterality: N/A;  Cervical three-four,  Cervical six-seven anterior cervical decompression with fusion plating and bonegraft   BIOPSY THYROID     benign thyroid nodule removal   CATARACT EXTRACTION, BILATERAL     COLONOSCOPY W/ BIOPSIES AND POLYPECTOMY     ENDOMETRIAL BIOPSY  2000   SPINE SURGERY Left  11/08/2014   Left ACDF    TUBAL LIGATION     VARICOSE VEIN SURGERY      FAMILY HISTORY Family History  Problem Relation Age of Onset   Cancer Mother        colon   Cancer Father        GI   Breast cancer Neg Hx      SOCIAL HISTORY Social History   Tobacco Use   Smoking status: Never   Smokeless tobacco: Never  Substance Use Topics   Alcohol use: No   Drug use: No         OPHTHALMIC EXAM:  Base Eye Exam     Visual Acuity (ETDRS)       Right Left   Dist cc CF at 3' 20/60 -2   Dist ph cc NI NI    Correction: Glasses         Tonometry (Tonopen, 1:49 PM)       Right Left   Pressure 20 8         Pupils       Pupils Dark Light APD   Right PERRL 5 4 None   Left PERRL 4 3 None         Extraocular Movement       Right Left    Full Full         Neuro/Psych     Oriented x3: Yes   Mood/Affect: Normal         Dilation     Both eyes: 1.0% Mydriacyl, 2.5% Phenylephrine @ 1:46 PM           Slit Lamp and Fundus Exam     External Exam       Right Left   External Normal Normal         Slit Lamp Exam       Right Left   Lids/Lashes Normal Normal   Conjunctiva/Sclera White and quiet White and quiet   Cornea Clear Clear   Anterior Chamber Deep and quiet Deep and quiet   Iris Round and reactive Round and reactive   Lens Posterior chamber intraocular lens Posterior chamber intraocular lens   Anterior Vitreous Normal Normal         Fundus Exam       Right Left   Posterior Vitreous Posterior vitreous detachment Posterior vitreous detachment   Disc peripapillary hemorrhage Normal   C/D Ratio 0.4 0.3   Macula Subfoveal geographic atrophy with large subfoveal disciform  scar Retinal pigment epithelial atrophy, Geographic atrophy, Advanced age related macular degeneration, Microaneurysms, Subretinal neovascular membrane, Subretinal hemorrhage smaller, still active, inferior to the fovea and along the inferotemporal arcade   Vessels Normal Normal   Periphery Normal Normal            IMAGING AND PROCEDURES  Imaging and Procedures for 08/21/21  Intravitreal Injection, Pharmacologic Agent - OD - Right Eye       Time Out 08/21/2021. 2:18 PM.  Confirmed correct patient, procedure, site, and patient consented.   Anesthesia Topical anesthesia was used. Anesthetic medications included Lidocaine 4%.   Procedure Preparation included Tobramycin 0.3%, 10% betadine to eyelids, 5% betadine to ocular surface. A 30 gauge needle was used.   Injection: 2.5 mg bevacizumab 2.5 MG/0.1ML   Route: Intravitreal, Site: Right Eye   NDC: [phone], Lot: [id number]   Post-op Post injection exam found visual acuity of at least counting fingers. The patient tolerated the procedure well. There were no complications. The patient received written and verbal post procedure care education. Post injection medications were not given.      Intravitreal Injection, Pharmacologic Agent - OS - Left Eye       Time Out 08/21/2021. 2:18 PM. Confirmed correct patient, procedure, site, and patient consented.   Anesthesia Topical anesthesia was used. Anesthetic medications included Lidocaine 4%.   Procedure Preparation included Ofloxacin , Tobramycin 0.3%, 10% betadine to eyelids, 5% betadine to ocular surface. A 30 gauge needle was used.   Injection: 2.5 mg bevacizumab 2.5 MG/0.1ML   Route: Intravitreal, Site: Left Eye   NDC: [id number], Lot: [id number]   Post-op Post injection exam found visual acuity of at least counting fingers. The patient tolerated the procedure well. There were no complications. The patient received written and verbal post procedure care education. Post injection medications included ocuflox.      OCT, Retina - OU - Both Eyes       Right Eye Quality was good. Scan locations included subfoveal. Central Foveal Thickness: 222. Progression has been stable. Findings include subretinal scarring, disciform scar, abnormal foveal contour, no SRF, no IRF.   Left Eye Quality was good. Scan locations included subfoveal. Central Foveal Thickness: 276. Progression has worsened. Findings include cystoid macular edema, intraretinal  hyper-reflective material, subretinal fluid, intraretinal fluid.   Notes OD with chronic disciform scar, with intraretinal fluid in the perifoveal region as well as subretinal fluid on the disciform scar inferotemporal to nerve  OS large amount of CME and intraretinal fluid improved today currently at 6-week interval post Avastin repeat injection today OS, vastly improved no extension towards the fovea             ASSESSMENT/PLAN:  Exudative age-related macular degeneration of left eye with active choroidal neovascularization (HCC) Less hemorrhage along the inferotemporal arcade of wet AMD threatening FAZ OS, stabilized on Avastin currently at 6-week follow-up  Exudative age-related macular degeneration of right eye with active choroidal neovascularization (HCC) Large disciform scar, with intraretinal fluid less active on treatment.  Repeat injection today     ICD-10-CM   1. Exudative age-related macular degeneration of left eye with active choroidal neovascularization (HCC)  H35.3221 Intravitreal Injection, Pharmacologic Agent - OS - Left Eye    OCT, Retina - OU - Both Eyes    bevacizumab (AVASTIN) SOSY 2.5 mg    2. Exudative age-related macular degeneration of right eye with active choroidal neovascularization (HCC)  H35.3211 Intravitreal Injection, Pharmacologic Agent - OD - Right Eye    OCT,  Retina - OU - Both Eyes    bevacizumab (AVASTIN) SOSY 2.5 mg      1.  OD with large subfoveal disciform scar but with active edges, repeat injection today to maintain and prevent scotoma enlargement  2.  OS, with much less disease activity along the inferotemporal arcade and preservation of functional macular vision left eye.  Residual intraretinal fluid temporal to the fovea.  Currently at 6-week interval repeat injection today  3.  Ophthalmic Meds Ordered this visit:  Meds ordered this encounter  Medications   bevacizumab (AVASTIN) SOSY 2.5 mg   bevacizumab (AVASTIN) SOSY 2.5 mg        Return in about 6 weeks (around 10/02/2021) for DILATE OU, COLOR FP, AVASTIN OCT, OD, OS.  There are no Patient Instructions on file for this visit.   Explained the diagnoses, plan, and follow up with the patient and they expressed understanding.  Patient expressed understanding of the importance of proper follow up care.   Clent Demark Charli Halle M.D. Diseases & Surgery of the Retina and Vitreous [facility] [place] 08/21/21     Abbreviations: M myopia (nearsighted); A astigmatism; H hyperopia (farsighted); P presbyopia; Mrx spectacle prescription;  CTL contact lenses; OD right eye; OS left eye; OU both eyes  XT exotropia; ET esotropia; PEK punctate epithelial keratitis; PEE punctate epithelial erosions; DES dry eye syndrome; MGD meibomian gland dysfunction; ATs artificial tears; PFAT's preservative free artificial tears; [place] nuclear sclerotic cataract; PSC posterior subcapsular cataract; ERM epi-retinal membrane; PVD posterior vitreous detachment; RD retinal detachment; DM diabetes mellitus; DR diabetic retinopathy; NPDR non-proliferative diabetic retinopathy; PDR proliferative diabetic retinopathy; CSME clinically significant macular edema; DME diabetic macular edema; dbh dot blot hemorrhages; CWS cotton wool spot; POAG primary open angle glaucoma; C/D cup-to-disc ratio; HVF humphrey visual field; GVF goldmann visual field; OCT optical coherence tomography; IOP intraocular pressure; BRVO Branch retinal vein occlusion; CRVO central retinal vein occlusion; CRAO central retinal artery occlusion; BRAO branch retinal artery occlusion; RT retinal tear; SB scleral buckle; PPV pars plana vitrectomy; VH Vitreous hemorrhage; PRP panretinal laser photocoagulation; IVK intravitreal kenalog; VMT vitreomacular traction; MH Macular hole;  NVD neovascularization of the disc; NVE neovascularization elsewhere; AREDS age related eye disease study; ARMD age related macular degeneration; POAG primary open  angle glaucoma; EBMD epithelial/anterior basement membrane dystrophy; ACIOL anterior chamber intraocular lens; IOL intraocular lens; PCIOL posterior chamber intraocular lens; Phaco/IOL phacoemulsification with intraocular lens placement; [place] photorefractive keratectomy; LASIK laser assisted in situ keratomileusis; HTN hypertension; DM diabetes mellitus; COPD chronic obstructive pulmonary disease

## 2021-08-21 NOTE — Assessment & Plan Note (Signed)
Less hemorrhage along the inferotemporal arcade of wet AMD threatening FAZ OS, stabilized on Avastin currently at 6-week follow-up

## 2021-08-27 ENCOUNTER — Encounter (INDEPENDENT_AMBULATORY_CARE_PROVIDER_SITE_OTHER): Payer: Medicare Other | Admitting: Ophthalmology

## 2021-08-27 ENCOUNTER — Other Ambulatory Visit: Payer: Self-pay | Admitting: Internal Medicine

## 2021-08-27 MED ORDER — GLIPIZIDE 5 MG PO TABS: ORAL_TABLET | ORAL | 1 refills | Status: DC

## 2021-10-01 LAB — HM DIABETES EYE EXAM

## 2021-10-04 ENCOUNTER — Ambulatory Visit (INDEPENDENT_AMBULATORY_CARE_PROVIDER_SITE_OTHER): Payer: Medicare Other | Admitting: Ophthalmology

## 2021-10-04 ENCOUNTER — Other Ambulatory Visit: Payer: Self-pay

## 2021-10-04 ENCOUNTER — Encounter (INDEPENDENT_AMBULATORY_CARE_PROVIDER_SITE_OTHER): Payer: Self-pay | Admitting: Ophthalmology

## 2021-10-04 DIAGNOSIS — H353211 Exudative age-related macular degeneration, right eye, with active choroidal neovascularization: Secondary | ICD-10-CM

## 2021-10-04 DIAGNOSIS — H353134 Nonexudative age-related macular degeneration, bilateral, advanced atrophic with subfoveal involvement: Secondary | ICD-10-CM

## 2021-10-04 DIAGNOSIS — H353221 Exudative age-related macular degeneration, left eye, with active choroidal neovascularization: Secondary | ICD-10-CM | POA: Diagnosis not present

## 2021-10-04 MED ORDER — BEVACIZUMAB 2.5 MG/0.1ML IZ SOSY
2.5000 mg | PREFILLED_SYRINGE | INTRAVITREAL | Status: AC | PRN
  Administered 2021-10-04: 2.5 mg via INTRAVITREAL

## 2021-10-04 NOTE — Assessment & Plan Note (Signed)
Much less intraretinal hemorrhage subretinal hemorrhage inferior to FAZ post Avastin some 6 weeks previous.  Repeat injection today follow-up next in a week

## 2021-10-04 NOTE — Assessment & Plan Note (Signed)
Accounts for acuity OU ?

## 2021-10-04 NOTE — Progress Notes (Signed)
10/04/2021     CHIEF COMPLAINT Patient presents for  Chief Complaint  Patient presents with   Retina Follow Up    History of bilateral wet AMD.  Most recent injection OD and OS some 6 weeks previous  HISTORY OF PRESENT ILLNESS: Catherine Wood is a 88 y.o. female who presents to the clinic today for:   HPI     Retina Follow Up           Diagnosis: Wet AMD   Laterality: both eyes   Onset: 6 weeks ago   Severity: mild   Duration: 6 weeks   Course: stable         Comments   6 mos fu Dilate OU, FP, OCT, Avastin OU. Patient states vision is stable and unchanged since last visit. Denies any new floaters or FOL.        Last edited by Laurin Coder on 10/04/2021  1:58 PM.      Referring physician: [name], MD [address] [place],  [place] [address]  HISTORICAL INFORMATION:   Selected notes from the MEDICAL RECORD NUMBER    Lab Results  Component Value Date   HGBA1C 6.0 (H) 08/20/2021     CURRENT MEDICATIONS: No current outpatient medications on file. (Ophthalmic Drugs)   No current facility-administered medications for this visit. (Ophthalmic Drugs)   Current Outpatient Medications (Other)  Medication Sig   ACCU-CHEK SOFTCLIX LANCETS lancets CHECK BLOOD SUGER UP TO 3 TIMES A DAY   aspirin (SB LOW DOSE ASA EC) 81 MG EC tablet Take      1 tablet        Daily   Cholecalciferol (VITAMIN D3) 125 MCG (5000 UT) CAPS Take      1 capsule     2 x /day   DULoxetine (CYMBALTA) 30 MG capsule Take  1 capsule  Daily  for Mood & Chronic Pain   furosemide (LASIX) 40 MG tablet Take  1 tablet 2 x /day for Fluid Retention /Ankle Swelling   glipiZIDE (GLUCOTROL) 5 MG tablet Take  1/2 to 1 tablet  2 to 3 x /day  with Meals for Diabetes   insulin NPH-regular Human (NOVOLIN 70/30 RELION) (70-30) 100 UNIT/ML injection Inject  50 Units  2 x /day  before Meals as Directed  for Diabetes ( Max 100 units /Day)   Insulin Pen Needle (PEN NEEDLES 31GX5/16")  31G X 8 MM MISC USE 4 TIMES A DAY   levothyroxine (SYNTHROID) 100 MCG tablet Take  1 tablet  Daily  on an empty stomach with only water for 30 minutes & no Antacid meds, Calcium or Magnesium for 4 hours & avoid Biotin   losartan (COZAAR) 100 MG tablet Take  1 tablet  Daily  for BP   Magnesium 400 MG CAPS Take      1 capsule       Daily   MegaRed Omega-3 Krill Oil 500 MG CAPS Take      1 capsule      2 x /day         for Triglycerides  (Blood Fats)   metFORMIN (GLUCOPHAGE) 1000 MG tablet TAKE (1) TABLET TWICE A DAY WITH MEALS (BREAKFAST AND SUPPER)   Multiple Vitamins-Minerals (ICAPS) CAPS Takle     1 tablet     Daily for Eyes   rosuvastatin (CRESTOR) 5 MG tablet Take  1 tablet  Daily  for Cholesterol   No current facility-administered medications for this  visit. (Other)      REVIEW OF SYSTEMS:    ALLERGIES Allergies  Allergen Reactions   Detrol [Tolterodine]     PAST MEDICAL HISTORY Past Medical History:  Diagnosis Date   Arthritis    GERD (gastroesophageal reflux disease)    HOH (hard of hearing)    Hyperlipidemia    Hypertension    Hypothyroid    IDDM (insulin dependent diabetes mellitus)    Leg swelling    Macular degeneration    Shortness of breath dyspnea    with exertion   Sleep apnea    does not wear CPAP   Varicose veins    Wears glasses    Past Surgical History:  Procedure Laterality Date   ANTERIOR CERVICAL DECOMP/DISCECTOMY FUSION N/A 11/08/2014   Procedure: Cervical three-four,  Cervical six-seven anterior cervical decompression with fusion plating and bonegraft;  Surgeon: Ashok Pall, MD;  Location: [facility];  Service: Neurosurgery;  Laterality: N/A;  Cervical three-four,  Cervical six-seven anterior cervical decompression with fusion plating and bonegraft   BIOPSY THYROID     benign thyroid nodule removal   CATARACT EXTRACTION, BILATERAL     COLONOSCOPY W/ BIOPSIES AND POLYPECTOMY     ENDOMETRIAL BIOPSY  2000   SPINE SURGERY Left  11/08/2014    Left ACDF    TUBAL LIGATION     VARICOSE VEIN SURGERY      FAMILY HISTORY Family History  Problem Relation Age of Onset   Cancer Mother        colon   Cancer Father        GI   Breast cancer Neg Hx     SOCIAL HISTORY Social History   Tobacco Use   Smoking status: Never   Smokeless tobacco: Never  Substance Use Topics   Alcohol use: No   Drug use: No         OPHTHALMIC EXAM:  Base Eye Exam     Visual Acuity (ETDRS)       Right Left   Dist cc CF at 1' 20/50 -2   Dist ph cc NI NI    Correction: Glasses         Tonometry (Tonopen, 2:01 PM)       Right Left   Pressure 14 16         Pupils       Pupils Dark Light React APD   Right PERRL 4 3 Sluggish None   Left PERRL 5 4 Sluggish None         Extraocular Movement       Right Left    Full Full         Neuro/Psych     Oriented x3: Yes   Mood/Affect: Normal         Dilation     Both eyes: 1.0% Mydriacyl, 2.5% Phenylephrine @ 2:01 PM           Slit Lamp and Fundus Exam     External Exam       Right Left   External Normal Normal         Slit Lamp Exam       Right Left   Lids/Lashes Normal Normal   Conjunctiva/Sclera White and quiet White and quiet   Cornea Clear Clear   Anterior Chamber Deep and quiet Deep and quiet   Iris Round and reactive Round and reactive   Lens Posterior chamber intraocular lens Posterior chamber intraocular lens   Anterior Vitreous Normal  Normal         Fundus Exam       Right Left   Posterior Vitreous Posterior vitreous detachment Posterior vitreous detachment   Disc peripapillary hemorrhage Normal   C/D Ratio 0.4 0.3   Macula Subfoveal geographic atrophy with large subfoveal disciform scar Retinal pigment epithelial atrophy, Geographic atrophy, Advanced age related macular degeneration, Microaneurysms, Subretinal neovascular membrane, Subretinal hemorrhage smaller, still active, inferior to the fovea and along the inferotemporal arcade  but less hemorrhage   Vessels Normal Normal   Periphery Normal Normal            IMAGING AND PROCEDURES  Imaging and Procedures for 10/04/21  Intravitreal Injection, Pharmacologic Agent - OD - Right Eye       Time Out 10/04/2021. 2:59 PM. Confirmed correct patient, procedure, site, and patient consented.   Anesthesia Topical anesthesia was used. Anesthetic medications included Lidocaine 4%.   Procedure Preparation included Tobramycin 0.3%, 10% betadine to eyelids, 5% betadine to ocular surface. A 30 gauge needle was used.   Injection: 2.5 mg bevacizumab 2.5 MG/0.1ML   Route: Intravitreal, Site: Right Eye   NDC: [phone], Lot: [id number]   Post-op Post injection exam found visual acuity of at least counting fingers. The patient tolerated the procedure well. There were no complications. The patient received written and verbal post procedure care education. Post injection medications were not given.      Intravitreal Injection, Pharmacologic Agent - OS - Left Eye       Time Out 10/04/2021. 3:00 PM. Confirmed correct patient, procedure, site, and patient consented.   Anesthesia Topical anesthesia was used. Anesthetic medications included Lidocaine 4%.   Procedure Preparation included Ofloxacin , Tobramycin 0.3%, 10% betadine to eyelids, 5% betadine to ocular surface. A 30 gauge needle was used.   Injection: 2.5 mg bevacizumab 2.5 MG/0.1ML   Route: Intravitreal, Site: Left Eye   NDC: [phone], Lot: [id number]   Post-op Post injection exam found visual acuity of at least counting fingers. The patient tolerated the procedure well. There were no complications. The patient received written and verbal post procedure care education. Post injection medications included ocuflox.      OCT, Retina - OU - Both Eyes       Right Eye Quality was good. Scan locations included subfoveal. Central Foveal Thickness: 177. Progression has been stable. Findings include subretinal  scarring, disciform scar, abnormal foveal contour, no SRF, no IRF.   Left Eye Quality was good. Scan locations included subfoveal. Central Foveal Thickness: 276. Progression has improved. Findings include cystoid macular edema, intraretinal hyper-reflective material, subretinal fluid, intraretinal fluid.   Notes OD with chronic disciform scar, with intraretinal fluid in the perifoveal region as well as subretinal fluid on the disciform scar inferotemporal to nerve  OS large amount of CME and intraretinal fluid improved today currently at 6-week interval post Avastin repeat injection today OS, vastly improved no extension towards the fovea, much less hemorrhage inferiorly     Color Fundus Photography Optos - OU - Both Eyes       Right Eye Progression has worsened. Macula : hemorrhage, geographic atrophy. Vessels : normal observations. Periphery : normal observations.   Left Eye Progression has been stable. Disc findings include normal observations. Macula : hemorrhage, retinal pigment epithelium abnormalities, geographic atrophy. Vessels : normal observations.   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 Exudative age-related macular degeneration of right eye with active choroidal neovascularization (HCC)  H35.3211 Intravitreal Injection, Pharmacologic Agent - OD - Right Eye    OCT, Retina - OU - Both Eyes    Color Fundus Photography Optos - OU - Both Eyes    bevacizumab (AVASTIN) SOSY 2.5 mg    2. Exudative age-related macular degeneration of left eye with active choroidal neovascularization (HCC)  H35.3221 Intravitreal Injection, Pharmacologic Agent - OS - Left Eye    OCT, Retina - OU - Both Eyes    Color Fundus Photography Optos - OU - Both Eyes    bevacizumab (AVASTIN) SOSY 2.5 mg    3. Advanced nonexudative age-related macular degeneration of both eyes with subfoveal involvement  H35.3134       1.  OD much less intraretinal subretinal hemorrhage thus good response to therapy Avastin to prevent scotoma enlargement at 6-week interval repeat injection today.  Vision limited by subfoveal disciform and atrophy  2.  OS much less intraretinal hemorrhage inferior to FAZ, repeat injection OS today to prevent enlargement of scotoma  3.  Ophthalmic Meds Ordered this visit:  Meds ordered this encounter  Medications   bevacizumab (AVASTIN) SOSY 2.5 mg   bevacizumab (AVASTIN) SOSY 2.5 mg       Return in about 8 weeks (around 11/29/2021) for DILATE OU, AVASTIN OCT, OS,, observe OD.  There are no Patient Instructions on file for this visit.   Explained the diagnoses, plan, and follow up with the patient and they expressed understanding.  Patient expressed understanding of the importance of proper follow up care.   Clent Demark Deloros Beretta M.D. Diseases & Surgery of the Retina and Vitreous [facility] 10/04/21     Abbreviations: M myopia (nearsighted); A astigmatism; H hyperopia (farsighted); P presbyopia; Mrx spectacle prescription;  CTL contact lenses; OD right eye; OS  left eye; OU both eyes  XT exotropia; ET esotropia; PEK punctate epithelial keratitis; PEE punctate epithelial erosions; DES dry eye syndrome; MGD meibomian gland dysfunction; ATs artificial tears; PFAT's preservative free artificial tears; [employer] nuclear sclerotic cataract; PSC posterior subcapsular cataract; ERM epi-retinal membrane; PVD posterior vitreous detachment; RD retinal detachment; DM diabetes mellitus; DR diabetic retinopathy; NPDR non-proliferative diabetic retinopathy; PDR proliferative diabetic retinopathy; CSME clinically significant macular edema; DME diabetic macular edema; dbh dot blot hemorrhages; CWS cotton wool spot; POAG primary open angle glaucoma; C/D cup-to-disc ratio; HVF humphrey visual field; GVF goldmann visual field; OCT optical coherence tomography; IOP intraocular pressure; BRVO Branch retinal vein occlusion; CRVO central retinal vein occlusion; CRAO central retinal artery occlusion; BRAO branch retinal artery occlusion; RT retinal tear; SB scleral buckle; PPV pars plana vitrectomy; VH Vitreous hemorrhage; PRP panretinal laser photocoagulation; IVK intravitreal kenalog; VMT vitreomacular traction; MH Macular hole;  NVD neovascularization of the disc; NVE neovascularization elsewhere; AREDS age related  eye disease study; ARMD age related macular degeneration; POAG primary open angle glaucoma; EBMD epithelial/anterior basement membrane dystrophy; ACIOL anterior chamber intraocular lens; IOL intraocular lens; PCIOL posterior chamber intraocular lens; Phaco/IOL phacoemulsification with intraocular lens placement; [place] photorefractive keratectomy; LASIK laser assisted in situ keratomileusis; HTN hypertension; DM diabetes mellitus; COPD chronic obstructive pulmonary disease

## 2021-10-04 NOTE — Assessment & Plan Note (Signed)
Smaller lesion, and less active hemorrhage, will repeat injection today and follow-up also in 8 weeks

## 2021-10-22 ENCOUNTER — Other Ambulatory Visit: Payer: Self-pay | Admitting: Internal Medicine

## 2021-10-22 MED ORDER — GLIPIZIDE 5 MG PO TABS: ORAL_TABLET | ORAL | 1 refills | Status: DC

## 2021-11-08 ENCOUNTER — Encounter: Payer: Medicare Other | Admitting: Adult Health

## 2021-11-08 ENCOUNTER — Ambulatory Visit: Payer: Medicare Other | Admitting: Adult Health

## 2021-11-29 ENCOUNTER — Ambulatory Visit (INDEPENDENT_AMBULATORY_CARE_PROVIDER_SITE_OTHER): Payer: Medicare Other | Admitting: Ophthalmology

## 2021-11-29 ENCOUNTER — Encounter (INDEPENDENT_AMBULATORY_CARE_PROVIDER_SITE_OTHER): Payer: Medicare Other | Admitting: Ophthalmology

## 2021-11-29 ENCOUNTER — Other Ambulatory Visit: Payer: Self-pay

## 2021-11-29 ENCOUNTER — Encounter (INDEPENDENT_AMBULATORY_CARE_PROVIDER_SITE_OTHER): Payer: Self-pay | Admitting: Ophthalmology

## 2021-11-29 DIAGNOSIS — H353134 Nonexudative age-related macular degeneration, bilateral, advanced atrophic with subfoveal involvement: Secondary | ICD-10-CM

## 2021-11-29 DIAGNOSIS — H353211 Exudative age-related macular degeneration, right eye, with active choroidal neovascularization: Secondary | ICD-10-CM

## 2021-11-29 DIAGNOSIS — H353221 Exudative age-related macular degeneration, left eye, with active choroidal neovascularization: Secondary | ICD-10-CM | POA: Diagnosis not present

## 2021-11-29 MED ORDER — BEVACIZUMAB 2.5 MG/0.1ML IZ SOSY
2.5000 mg | PREFILLED_SYRINGE | INTRAVITREAL | Status: AC | PRN
  Administered 2021-11-29: 2.5 mg via INTRAVITREAL

## 2021-11-29 NOTE — Progress Notes (Signed)
? ? ?11/29/2021 ? ?  ? ?CHIEF COMPLAINT ?Patient presents for  ?Chief Complaint  ?Patient presents with  ? Macular Degeneration  ? ? ? ? ?HISTORY OF PRESENT ILLNESS: ?Catherine Wood is a 88 y.o. female who presents to the clinic today for:  ? ?HPI   ?8 weeks for dilate ou, avastin oct, os,,observe OD. ?Pt states no changes in vision ?Pt denies floaters and FOL. ? ? ? ?Last edited by Silvestre Moment on 11/29/2021 12:53 PM.  ?  ? ? ?Referring physician: ?[name], MD ?[address] ?[address] ?[place],  [place] [address] ? ?HISTORICAL INFORMATION:  ? ?Selected notes from the [facility] ?  ? ?Lab Results  ?Component Value Date  ? HGBA1C 6.0 (H) 08/20/2021  ?  ? ?CURRENT MEDICATIONS: ?No current outpatient medications on file. (Ophthalmic Drugs)  ? ?No current facility-administered medications for this visit. (Ophthalmic Drugs)  ? ?Current Outpatient Medications (Other)  ?Medication Sig  ? ACCU-CHEK SOFTCLIX LANCETS lancets CHECK BLOOD SUGER UP TO 3 TIMES A DAY  ? aspirin (SB LOW DOSE ASA EC) 81 MG EC tablet Take      1 tablet        Daily  ? Cholecalciferol (VITAMIN D3) 125 MCG (5000 UT) CAPS Take      1 capsule     2 x /day  ? DULoxetine (CYMBALTA) 30 MG capsule Take  1 capsule  Daily  for Mood & Chronic Pain  ? furosemide (LASIX) 40 MG tablet Take  1 tablet 2 x /day for Fluid Retention /Ankle Swelling  ? glipiZIDE (GLUCOTROL) 5 MG tablet Take  1/2 to 1 tablet  2 to 3 x /day  with Meals for Diabetes  ? insulin NPH-regular Human (NOVOLIN 70/30 RELION) (70-30) 100 UNIT/ML injection Inject  50 Units  2 x /day  before Meals as Directed  for Diabetes ( Max 100 units /Day)  ? Insulin Pen Needle (PEN NEEDLES 31GX5/16") 31G X 8 MM MISC USE 4 TIMES A DAY  ? levothyroxine (SYNTHROID) 100 MCG tablet Take  1 tablet  Daily  on an empty stomach with only water for 30 minutes & no Antacid meds, Calcium or Magnesium for 4 hours & avoid Biotin  ? losartan (COZAAR) 100 MG tablet Take  1 tablet  Daily  for BP  ? Magnesium 400 MG  CAPS Take      1 capsule       Daily  ? MegaRed Omega-3 Krill Oil 500 MG CAPS Take      1 capsule      2 x /day         for Triglycerides  (Blood Fats)  ? metFORMIN (GLUCOPHAGE) 1000 MG tablet TAKE (1) TABLET TWICE A DAY WITH MEALS (BREAKFAST AND SUPPER)  ? Multiple Vitamins-Minerals (ICAPS) CAPS Takle     1 tablet     Daily for Eyes  ? rosuvastatin (CRESTOR) 5 MG tablet Take  1 tablet  Daily  for Cholesterol  ? ?No current facility-administered medications for this visit. (Other)  ? ? ? ? ?REVIEW OF SYSTEMS: ?ROS   ?Negative for: Constitutional, Gastrointestinal, Neurological, Skin, Genitourinary, Musculoskeletal, HENT, Endocrine, Cardiovascular, Eyes, Respiratory, Psychiatric, Allergic/Imm, Heme/Lymph ?Last edited by Silvestre Moment on 11/29/2021 12:53 PM.  ?  ? ? ? ?ALLERGIES ?Allergies  ?Allergen Reactions  ? Detrol [Tolterodine]   ? ? ?PAST MEDICAL HISTORY ?Past Medical History:  ?Diagnosis Date  ? Arthritis   ? GERD (gastroesophageal reflux disease)   ? HOH (  hard of hearing)   ? Hyperlipidemia   ? Hypertension   ? Hypothyroid   ? IDDM (insulin dependent diabetes mellitus)   ? Leg swelling   ? Macular degeneration   ? Shortness of breath dyspnea   ? with exertion  ? Sleep apnea   ? does not wear CPAP  ? Varicose veins   ? Wears glasses   ? ?Past Surgical History:  ?Procedure Laterality Date  ? ANTERIOR CERVICAL DECOMP/DISCECTOMY FUSION N/A 11/08/2014  ? Procedure: Cervical three-four,  Cervical six-seven anterior cervical decompression with fusion plating and bonegraft;  Surgeon: Ashok Pall, MD;  Location: [facility];  Service: Neurosurgery;  Laterality: N/A;  Cervical three-four,  Cervical six-seven anterior cervical decompression with fusion plating and bonegraft  ? BIOPSY THYROID    ? benign thyroid nodule removal  ? CATARACT EXTRACTION, BILATERAL    ? COLONOSCOPY W/ BIOPSIES AND POLYPECTOMY    ? ENDOMETRIAL BIOPSY  2000  ? SPINE SURGERY Left  11/08/2014  ? Left ACDF   ? TUBAL LIGATION    ? VARICOSE VEIN SURGERY     ? ? ?FAMILY HISTORY ?Family History  ?Problem Relation Age of Onset  ? Cancer Mother   ?     colon  ? Cancer Father   ?     GI  ? Breast cancer Neg Hx   ? ? ?SOCIAL HISTORY ?Social History  ? ?Tobacco Use  ? Smoking status: Never  ? Smokeless tobacco: Never  ?Substance Use Topics  ? Alcohol use: No  ? Drug use: No  ? ?  ? ?  ? ?OPHTHALMIC EXAM: ? ?Base Eye Exam   ? ? Visual Acuity (ETDRS)   ? ?   Right Left  ? Dist cc CF at 1' 20/60 -2  ? ? Correction: Glasses  ? ?  ?  ? ? Tonometry (Tonopen, 12:59 PM)   ? ?   Right Left  ? Pressure 18 16  ? ?  ?  ? ? Pupils   ? ?   Pupils APD  ? Right PERRL None  ? Left PERRL None  ? ?  ?  ? ? Visual Fields   ? ?   Left Right  ?  Full Full  ? ?  ?  ? ? Extraocular Movement   ? ?   Right Left  ?  Full Full  ? ?  ?  ? ? Neuro/Psych   ? ? Oriented x3: Yes  ? Mood/Affect: Normal  ? ?  ?  ? ? Dilation   ? ? Both eyes: 1.0% Mydriacyl, 2.5% Phenylephrine @ 12:59 PM  ? ?  ?  ? ?  ? ?Slit Lamp and Fundus Exam   ? ? External Exam   ? ?   Right Left  ? External Normal Normal  ? ?  ?  ? ? Slit Lamp Exam   ? ?   Right Left  ? Lids/Lashes Normal Normal  ? Conjunctiva/Sclera White and quiet White and quiet  ? Cornea Clear Clear  ? Anterior Chamber Deep and quiet Deep and quiet  ? Iris Round and reactive Round and reactive  ? Lens Posterior chamber intraocular lens Posterior chamber intraocular lens  ? Anterior Vitreous Normal Normal  ? ?  ?  ? ? Fundus Exam   ? ?   Right Left  ? Posterior Vitreous Posterior vitreous detachment Posterior vitreous detachment  ? Disc peripapillary hemorrhage Normal  ? C/D Ratio 0.4 0.3  ?  Macula Subfoveal geographic atrophy with large subfoveal disciform scar Retinal pigment epithelial atrophy, Geographic atrophy, Advanced age related macular degeneration, Microaneurysms, Subretinal neovascular membrane, Subretinal hemorrhage smaller, still active, inferior to the fovea and along the inferotemporal arcade but less hemorrhage  ? Vessels Normal Normal  ? Periphery  Normal Normal  ? ?  ?  ? ?  ? ? ?IMAGING AND PROCEDURES  ?Imaging and Procedures for 11/29/21 ? ?OCT, Retina - OU - Both Eyes   ? ?   ?Right Eye ?Quality was good. Scan locations included subfoveal. Central Foveal Thickness: 191. Progression has been stable. Findings include subretinal scarring, disciform scar, abnormal foveal contour, no SRF, no IRF.  ? ?Left Eye ?Quality was good. Scan locations included subfoveal. Central Foveal Thickness: 235. Progression has improved. Findings include intraretinal hyper-reflective material.  ? ?Notes ?OD with chronic disciform scar, with intraretinal fluid in the perifoveal region as well as subretinal fluid on the disciform scar inferotemporal to nerve ? ?OS vastly improved over the last 5 months on therapy.  We will repeat injection today OS at 8-week follow-up interval and extend interval next in order to minimize the risk of geographic atrophy spreading ? ?  ? ?Intravitreal Injection, Pharmacologic Agent - OS - Left Eye   ? ?   ?Time Out ?11/29/2021. 1:45 PM. Confirmed correct patient, procedure, site, and patient consented.  ? ?Anesthesia ?Topical anesthesia was used. Anesthetic medications included Lidocaine 4%.  ? ?Procedure ?Preparation included Ofloxacin , Tobramycin 0.3%, 10% betadine to eyelids, 5% betadine to ocular surface. A 30 gauge needle was used.  ? ?Injection: ?2.5 mg bevacizumab 2.5 MG/0.1ML ?  Route: Intravitreal, Site: Left Eye ?  [place]: 16967-893-81  ? ?Post-op ?Post injection exam found visual acuity of at least counting fingers. The patient tolerated the procedure well. There were no complications. The patient received written and verbal post procedure care education. Post injection medications included ocuflox.  ? ?  ? ? ?  ?  ? ?  ?ASSESSMENT/PLAN: ? ?Exudative age-related macular degeneration of right eye with active choroidal neovascularization ([place]) ?Overall lesion improved since therapy yet subfoveal disciform scar and geographic atrophy OD limits  acuity ultimately.  We will observe today ? ?Exudative age-related macular degeneration of left eye with active choroidal neovascularization ([place]) ?Active disease at an inferior to FAZ, in the recent pas

## 2021-11-29 NOTE — Assessment & Plan Note (Signed)
Overall lesion improved since therapy yet subfoveal disciform scar and geographic atrophy OD limits acuity ultimately.  We will observe today ?

## 2021-11-29 NOTE — Assessment & Plan Note (Signed)
In the [place] with preserved acuity and in the FAZ OD with decreased acuity ?

## 2021-11-29 NOTE — Assessment & Plan Note (Signed)
Active disease at an inferior to FAZ, in the recent past, will treat today at 8-week follow-up interval to maintain follow-up next OU in 10 weeks possible injection OS left eye at 10 weeks ?

## 2021-12-06 ENCOUNTER — Ambulatory Visit (INDEPENDENT_AMBULATORY_CARE_PROVIDER_SITE_OTHER): Payer: Medicare Other | Admitting: Adult Health

## 2021-12-06 ENCOUNTER — Encounter: Payer: Self-pay | Admitting: Adult Health

## 2021-12-06 VITALS — BP 106/60 | HR 69 | Temp 97.5°F | Wt 188.0 lb

## 2021-12-06 DIAGNOSIS — E039 Hypothyroidism, unspecified: Secondary | ICD-10-CM | POA: Diagnosis not present

## 2021-12-06 DIAGNOSIS — E1169 Type 2 diabetes mellitus with other specified complication: Secondary | ICD-10-CM | POA: Diagnosis not present

## 2021-12-06 DIAGNOSIS — Z0000 Encounter for general adult medical examination without abnormal findings: Secondary | ICD-10-CM

## 2021-12-06 DIAGNOSIS — E1142 Type 2 diabetes mellitus with diabetic polyneuropathy: Secondary | ICD-10-CM | POA: Diagnosis not present

## 2021-12-06 DIAGNOSIS — Z0001 Encounter for general adult medical examination with abnormal findings: Secondary | ICD-10-CM | POA: Diagnosis not present

## 2021-12-06 DIAGNOSIS — R6889 Other general symptoms and signs: Secondary | ICD-10-CM | POA: Diagnosis not present

## 2021-12-06 DIAGNOSIS — E559 Vitamin D deficiency, unspecified: Secondary | ICD-10-CM | POA: Diagnosis not present

## 2021-12-06 DIAGNOSIS — M8589 Other specified disorders of bone density and structure, multiple sites: Secondary | ICD-10-CM | POA: Diagnosis not present

## 2021-12-06 DIAGNOSIS — N183 Chronic kidney disease, stage 3 unspecified: Secondary | ICD-10-CM

## 2021-12-06 DIAGNOSIS — I872 Venous insufficiency (chronic) (peripheral): Secondary | ICD-10-CM | POA: Diagnosis not present

## 2021-12-06 DIAGNOSIS — Z79899 Other long term (current) drug therapy: Secondary | ICD-10-CM | POA: Diagnosis not present

## 2021-12-06 DIAGNOSIS — E785 Hyperlipidemia, unspecified: Secondary | ICD-10-CM

## 2021-12-06 DIAGNOSIS — N1832 Chronic kidney disease, stage 3b: Secondary | ICD-10-CM

## 2021-12-06 DIAGNOSIS — E669 Obesity, unspecified: Secondary | ICD-10-CM

## 2021-12-06 DIAGNOSIS — H353221 Exudative age-related macular degeneration, left eye, with active choroidal neovascularization: Secondary | ICD-10-CM

## 2021-12-06 DIAGNOSIS — F0150 Vascular dementia without behavioral disturbance: Secondary | ICD-10-CM

## 2021-12-06 DIAGNOSIS — E1122 Type 2 diabetes mellitus with diabetic chronic kidney disease: Secondary | ICD-10-CM

## 2021-12-06 DIAGNOSIS — I10 Essential (primary) hypertension: Secondary | ICD-10-CM | POA: Diagnosis not present

## 2021-12-06 DIAGNOSIS — H353211 Exudative age-related macular degeneration, right eye, with active choroidal neovascularization: Secondary | ICD-10-CM

## 2021-12-06 DIAGNOSIS — Z794 Long term (current) use of insulin: Secondary | ICD-10-CM

## 2021-12-06 MED ORDER — FUROSEMIDE 40 MG PO TABS: ORAL_TABLET | ORAL | 3 refills | Status: DC

## 2021-12-06 MED ORDER — TRULICITY 0.75 MG/0.5ML ~~LOC~~ SOAJ: 0.7500 mg | SUBCUTANEOUS | 0 refills | Status: DC

## 2021-12-06 NOTE — Progress Notes (Signed)
?MEDICARE ANNUAL WELLNESS VISIT AND FOLLOW UP ?Assessment:  ? ?Catherine Wood was seen today for [employer]. ? ?Diagnoses and all orders for this visit: ? ?Annual Medicare Wellness Visit ?Due annually  ?Health maintenance reviewed ? ?Essential hypertension ?Has been low; reduce furosemide 40 mg BID to once daily  ?Continue losartan 100 mg ?Monitor blood pressure; call if consistently over 130/80 ?Continue DASH diet.   ?Reminder to go to the ER if any CP, SOB, nausea, dizziness, severe HA, changes vision/speech, left arm numbness and tingling and jaw pain. ? ?Mixed hyperlipidemia ?Continue rosuvastatin ?LDL goal <70  ?Continue low cholesterol diet and exercise.  ?Check lipid panel.  ? ?CKD stage 3 due to type 2 diabetes mellitus ([place]) ?Increase fluids, avoid NSAIDS, monitor sugars, will monitor ?Maximize ARB ? ?Hypothyroidism, unspecified type ?Taking levothyroxine 145mg ?Education provided about when to take medicine. ?Take 30-63m first thing in am with water prior to food or other medications. ?Discussed with patient and daughter. ?Will check labs next office visit ? ?Diabetic polyneuropathy associated with type 2 diabetes mellitus (HCCurlew?Taking Duloxetine 3051mDiscussed checking feet daily and well fitting shoes ?Follows with podiatry  ? ?DDD (degenerative disc disease), lumbar ?Managing at this time ?Not taking any medications for this ? ?Type 2 diabetes mellitus with stage 3 chronic kidney disease, with long-term current use of insulin (HCCSwisherOff of novolin 70/30 due to labile/hypoglycemia ?On glipizide 10 mg TID but poor control ?Discussed and started trulicity; 0.72.37 x 4 weeks sent, given 1.5 mg x 4 samples that were available to start ?Taper glipizide down with glucose improvement; son monitors closely and will manage ?Metformin 1000m98mice a day- if GFR <45 will reduce dose ?Close follow up in 6-8 weeks or sooner if needed ?Discussed dietary and exercise modifications ?Discussed general issues about  diabetes pathophysiology and management., Educational material distributed., Suggested low cholesterol diet., Encouraged aerobic exercise., Discussed foot care., Reminded to get yearly retinal exam. ? ?Obesity - BMI 34 ?Discussed dietary and exercise modifications ?Commended excellent weight loss ?Starting trulicity; goal for continued slow steady weight loss until BMI <30 ? ?Macular degeneration, bil, exudative with neovascularization (HCC)[place] closely with Dr. RankZadie RhineVitamin D deficiency ?Continue supplementation ?Check levels annually and as needed ?  ?Medication management ?continued ? ?Vascular dementia without behavioral disturbance (HCC)[place]; declines neuro referral  ?No needs at this time; son/daughter are monitoring closely  ?Control blood pressure, cholesterol, glucose, increase exercise.  ?Continue statin, ASA ? ?Osteopenia ?Declines further DEXA, would not start on meds ?Discussed vitamin D supplement, high calcium diet, encourage regular exercise; will monitor closely for fall risks/prevention ? ? ?Orders Placed This Encounter  ?Procedures  ? CBC with Differential/Platelet  ? COMPLETE METABOLIC PANEL WITH GFR  ? Magnesium  ? Lipid panel  ? TSH  ? Hemoglobin A1c  ? HM DIABETES EYE EXAM  ? ? ?  ?I discussed the assessment and treatment plan with the patient. The patient was provided an opportunity to ask questions and all were answered. The patient agreed with the plan and demonstrated an understanding of the instructions. ?  ?The patient was advised to call back or seek an in-person evaluation if the symptoms worsen or if the condition fails to improve as anticipated. ? ?I provided 30 minutes of non-face-to-face time during this encounter including counseling, chart review, and critical decision making was preformed. ? ? ?Future Appointments  ?Date Time Provider [place]/18/2023  3:00 PM CorbLiane Comber [facility] None  ?03/21/2022  1:15 PM Rankin, Clent Demark, MD [facility] None   ?05/22/2022  3:30 PM [name], MD [facility] None  ?08/27/2022  3:00 PM [name], MD [facility] None  ?12/11/2022  3:00 PM Liane Comber, NP [facility] None  ? ? ? ? ?Plan:  ? ?During the course of the visit the patient was educated and counseled about appropriate screening and preventive services including:  ? ?Pneumococcal vaccine  ?Influenza vaccine ?Prevnar 13 ?Td vaccine ?Screening electrocardiogram, deferred, telephone visit [place]. ?Colorectal cancer screening ?Diabetes screening ?Glaucoma screening ?Nutrition counseling  ? ? ?Subjective:  ?Catherine Wood is a 88 y.o. female who presents for Medicare Annual Wellness Visit and 3 month follow up for HTN, hyperlipidemia, DMII, polyneuropathy, hypothyroidism and vitamin D Def.  ? ?She is accompanied by her son, who has been taking care of medications, lives next to her and checks on her every 1-2 hours. Daughter also checks in on her regularly.  ?She has dementia; MRI from 10/2014 showed atrophy and small vessel disease. Has declined neuro referral. Walks with rolling walker, no falls. Driven by family, they manage meds/finances. She wears pull ups for occasional urine leaks, able to toilet by herself. Has bars in bathroom, bathes and dresses with assistance. Son reports she tends to want to stay in bed and sleep all day unless they actively engage.  ?Pleasant, no safety concerns, no wandering.  ? ?Very poor vision secondary to MD, Dr. Zadie Rhine follows closely.  ? ?She has neuropathy and takes cymbalta 30 mg daily, has tapered off of gabapentin. Has not c/o pain, denies today.  ? ?BMI is Body mass index is 34.39 kg/m?., she has been working on diet/exercise, son notes less snacking since off of insulin, he takes her out to walk up and down ramp 10 times daily (about 20 min of slow walking).  ?Wt Readings from Last 3 Encounters:  ?12/06/21 188 lb (85.3 kg)  ?08/20/21 202 lb 9.6 oz (91.9 kg)  ?05/21/21 203 lb 9.6 oz (92.4 kg)  ? ?Her blood pressure  has been controlled at home (110s/70s), today their BP is BP: 106/60. Currently taking furosemide 40 mg BID, losartan 100 mg daily.  ? She does workout. She denies chest pain, shortness of breath, dizziness. ? ?She is on cholesterol medication (rosuvastatin 5 mg daily) and denies myalgias. Her cholesterol is not at goal. The cholesterol last visit was:   ?Lab Results  ?Component Value Date  ? CHOL 147 08/20/2021  ? HDL 64 08/20/2021  ? [place] 63 08/20/2021  ? TRIG 114 08/20/2021  ? CHOLHDL 2.3 08/20/2021  ? ?She has been working on diet and exercise for DMII with CKD IIIb, neuropathy ?Novolin 70/30 - was d/c'd 08/2021, was very labile having hypoglycemia ?Now on glipizide, taking 10 mg TID but has been very labile with elevations per son ?Fasting very labile - 102 to 400, averaging 180-210 ?Checks TID, a bit higher in the afternoon  ?No more hypoglycemia <100 since stopping insulin ?She is on ASA, Statin, ARB ?Denies hypoglycemia , increased appetite, nausea, paresthesia of the feet, polydipsia, polyuria, and vomiting.  ?Last A1C in the office was:  ?Lab Results  ?Component Value Date  ? HGBA1C 6.0 (H) 08/20/2021  ? ?She has CKD III,  associated with T2DM and htn, on losartan. Last GFR:  ?Lab Results  ?Component Value Date  ? EGFR 48 (L) 08/20/2021  ? ?Lab Results  ?Component Value Date  ? MICRALBCREAT 120 (H) 08/20/2021  ? MICRALBCREAT 18 08/07/2020  ? MICRALBCREAT 22  07/01/2019  ? ?Patient is on Vitamin D supplement, taking 5000 IU daily    ?Lab Results  ?Component Value Date  ? VD25OH 54 08/20/2021  ?   ?She is on thyroid medication. Her medication was not changed last visit.  She is taking 127mg daily. ?Lab Results  ?Component Value Date  ? TSH 2.96 08/20/2021  ?.Marland Kitchen ?Medication Review: ? ?Current Outpatient Medications (Endocrine & Metabolic):  ?  Dulaglutide (TRULICITY) 01.17MBV/6.7OLSOPN, Inject 0.75 mg into the skin once a week. ?  glipiZIDE (GLUCOTROL) 5 MG tablet, Take  1/2 to 1 tablet  2 to 3 x /day  with  Meals for Diabetes ?  levothyroxine (SYNTHROID) 100 MCG tablet, Take  1 tablet  Daily  on an empty stomach with only water for 30 minutes & no Antacid meds, Calcium or Magnesium for 4 hours & avoid Biot

## 2021-12-06 NOTE — Patient Instructions (Signed)
Dulaglutide Injection ?What is this medication? ?DULAGLUTIDE (DOO la GLOO tide) treats type 2 diabetes. It works by increasing insulin levels in your body, which decreases your blood sugar (glucose). It also reduces the amount of sugar released into your blood and slows down your digestion. It can also be used to lower the risk of heart attack and stroke in people with type 2 diabetes. Changes to diet and exercise are often combined with this medication. ?This medicine may be used for other purposes; ask your health care provider or pharmacist if you have questions. ?COMMON BRAND NAME(S): Trulicity ?What should I tell my care team before I take this medication? ?They need to know if you have any of these conditions: ?Endocrine tumors (MEN 2) or if someone in your family had these tumors ?Eye disease, vision problems ?History of pancreatitis ?Kidney disease ?Liver disease ?Stomach or intestine problems ?Thyroid cancer or if someone in your family had thyroid cancer ?An unusual or allergic reaction to dulaglutide, other medications, foods, dyes, or preservatives ?Pregnant or trying to get pregnant ?Breast-feeding ?How should I use this medication? ?This medication is injected under the skin. You will be taught how to prepare and give it. Take it as directed on the prescription label on the same day of each week. Do NOT prime the pen. Keep taking it unless your care team tells you to stop. ?If you use this medication with insulin, you should inject this medication and the insulin separately. Do not mix them together. Do not give the injections right next to each other. Change (rotate) injection sites with each injection. ?This medication comes with INSTRUCTIONS FOR USE. Ask your pharmacist for directions on how to use this medication. Read the information carefully. Talk to your pharmacist or care team if you have questions. ?It is important that you put your used needles and syringes in a special sharps container. Do  not put them in a trash can. If you do not have a sharps container, call your pharmacist or care team to get one. ?A special MedGuide will be given to you by the pharmacist with each prescription and refill. Be sure to read this information carefully each time. ?Talk to your care team about the use of this medication in children. Special care may be needed. ?Overdosage: If you think you have taken too much of this medicine contact a poison control center or emergency room at once. ?NOTE: This medicine is only for you. Do not share this medicine with others. ?What if I miss a dose? ?If you miss a dose, take it as soon as you can unless it is more than 3 days late. If it is more than 3 days late, skip the missed dose. Take the next dose at the normal time. ?What may interact with this medication? ?Other medications for diabetes ?Many medications may cause changes in blood sugar, these include: ?Alcohol containing beverages ?Antiviral medications for HIV or AIDS ?Aspirin and aspirin-like medications ?Certain medications for blood pressure, heart disease, irregular heart beat ?Chromium ?Diuretics ?Female hormones, such as estrogens or progestins, birth control pills ?Fenofibrate ?Gemfibrozil ?Isoniazid ?Lanreotide ?Female hormones or anabolic steroids ?MAOIs like Carbex, Eldepryl, Marplan, Nardil, and Parnate ?Medications for allergies, asthma, cold, or cough ?Medications for depression, anxiety, or psychotic disturbances ?Medications for weight loss ?Niacin ?Nicotine ?NSAIDs, medications for pain and inflammation, like ibuprofen or naproxen ?Octreotide ?Pasireotide ?Pentamidine ?Phenytoin ?Probenecid ?Quinolone antibiotics such as ciprofloxacin, levofloxacin, ofloxacin ?Some herbal dietary supplements ?Steroid medications such as prednisone or cortisone ?Sulfamethoxazole;  trimethoprim ?Thyroid hormones ?Some medications can hide the warning symptoms of low blood sugar (hypoglycemia). You may need to monitor your blood  sugar more closely if you are taking one of these medications. These include: ?Beta-blockers, often used for high blood pressure or heart problems (examples include atenolol, metoprolol, propranolol) ?Clonidine ?Guanethidine ?Reserpine ?This list may not describe all possible interactions. Give your health care provider a list of all the medicines, herbs, non-prescription drugs, or dietary supplements you use. Also tell them if you smoke, drink alcohol, or use illegal drugs. Some items may interact with your medicine. ?What should I watch for while using this medication? ?Visit your care team for regular checks on your progress. ?Check with your care team if you have severe diarrhea, nausea, and vomiting, or if you sweat a lot. The loss of too much body fluid may make it dangerous for you to take this medication. ?A test called the HbA1C (A1C) will be monitored. This is a simple blood test. It measures your blood sugar control over the last 2 to 3 months. You will receive this test every 3 to 6 months. ?Learn how to check your blood sugar. Learn the symptoms of low and high blood sugar and how to manage them. ?Always carry a quick-source of sugar with you in case you have symptoms of low blood sugar. Examples include hard sugar candy or glucose tablets. Make sure others know that you can choke if you eat or drink when you develop serious symptoms of low blood sugar, such as seizures or unconsciousness. Get medical help at once. ?Tell your care team if you have high blood sugar. You might need to change the dose of your medication. If you are sick or exercising more than usual, you may need to change the dose of your medication. ?Do not skip meals. Ask your care team if you should avoid alcohol. Many nonprescription cough and cold products contain sugar or alcohol. These can affect blood sugar. ?Pens should never be shared. Even if the needle is changed, sharing may result in passing of viruses like hepatitis or  HIV. ?Wear a medical ID bracelet or chain. Carry a card that describes your condition. List the medications and doses you take on the card. ?What side effects may I notice from receiving this medication? ?Side effects that you should report to your care team as soon as possible: ?Allergic reactions--skin rash, itching, hives, swelling of the face, lips, tongue, or throat ?Change in vision ?Dehydration--increased thirst, dry mouth, feeling faint or lightheaded, headache, dark yellow or brown urine ?Kidney injury--decrease in the amount of urine, swelling of the ankles, hands, or feet ?Pancreatitis--severe stomach pain that spreads to your back or gets worse after eating or when touched, fever, nausea, vomiting ?Thyroid cancer--new mass or lump in the neck, pain or trouble swallowing, trouble breathing, hoarseness ?Side effects that usually do not require medical attention (report to your care team if they continue or are bothersome): ?Diarrhea ?Loss of appetite ?Nausea ?Stomach pain ?Vomiting ?This list may not describe all possible side effects. Call your doctor for medical advice about side effects. You may report side effects to FDA at 1-800-FDA-1088. ?Where should I keep my medication? ?Keep out of the reach of children and pets. ?Refrigeration (preferred): Store unopened pens in a refrigerator between 2 and 8 degrees C (36 and 46 degrees F). Keep it in the original carton until you are ready to take it. Do not freeze or use if the medication has been frozen.  Protect from light. Get rid of any unused medication after the expiration date on the label. ?Room Temperature: The pen may be stored at room temperature below 30 degrees C (86 degrees F) for up to a total of 14 days if needed. Protect from light. Avoid exposure to extreme heat. If it is stored at room temperature, throw away any unused medication after 14 days or after it expires, whichever is first. ?To get rid of medications that are no longer needed or  have expired: ?Take the medication to a medication take-back program. Check with your pharmacy or law enforcement to find a location. ?If you cannot return the medication, ask your pharmacist or care team ho

## 2021-12-07 ENCOUNTER — Other Ambulatory Visit: Payer: Self-pay | Admitting: Adult Health

## 2021-12-07 DIAGNOSIS — Z79899 Other long term (current) drug therapy: Secondary | ICD-10-CM

## 2021-12-07 LAB — COMPLETE METABOLIC PANEL WITH GFR
AG Ratio: 1.3 (calc) (ref 1.0–2.5)
ALT: 14 U/L (ref 6–29)
AST: 13 U/L (ref 10–35)
Albumin: 3.6 g/dL (ref 3.6–5.1)
Alkaline phosphatase (APISO): 66 U/L (ref 37–153)
BUN/Creatinine Ratio: 20 (calc) (ref 6–22)
BUN: 24 mg/dL (ref 7–25)
CO2: 30 mmol/L (ref 20–32)
Calcium: 9.5 mg/dL (ref 8.6–10.4)
Chloride: 93 mmol/L — ABNORMAL LOW (ref 98–110)
Creat: 1.22 mg/dL — ABNORMAL HIGH (ref 0.60–0.95)
Globulin: 2.8 g/dL (calc) (ref 1.9–3.7)
Glucose, Bld: 309 mg/dL — ABNORMAL HIGH (ref 65–99)
Potassium: 5.7 mmol/L — ABNORMAL HIGH (ref 3.5–5.3)
Sodium: 134 mmol/L — ABNORMAL LOW (ref 135–146)
Total Bilirubin: 0.4 mg/dL (ref 0.2–1.2)
Total Protein: 6.4 g/dL (ref 6.1–8.1)
eGFR: 42 mL/min/{1.73_m2} — ABNORMAL LOW (ref >=60)

## 2021-12-07 LAB — CBC WITH DIFFERENTIAL/PLATELET
Absolute Monocytes: 1274 cells/uL — ABNORMAL HIGH (ref 200–950)
Basophils Absolute: 59 cells/uL (ref 0–200)
Basophils Relative: 0.5 %
Eosinophils Absolute: 425 cells/uL (ref 15–500)
Eosinophils Relative: 3.6 %
HCT: 38.6 % (ref 35.0–45.0)
Hemoglobin: 12.4 g/dL (ref 11.7–15.5)
Lymphs Abs: 2254 cells/uL (ref 850–3900)
MCH: 27.6 pg (ref 27.0–33.0)
MCHC: 32.1 g/dL (ref 32.0–36.0)
MCV: 86 fL (ref 80.0–100.0)
MPV: 10.9 fL (ref 7.5–12.5)
Monocytes Relative: 10.8 %
Neutro Abs: 7788 cells/uL (ref 1500–7800)
Neutrophils Relative %: 66 %
Platelets: 410 10*3/uL — ABNORMAL HIGH (ref 140–400)
RBC: 4.49 10*6/uL (ref 3.80–5.10)
RDW: 12.8 % (ref 11.0–15.0)
Total Lymphocyte: 19.1 %
WBC: 11.8 10*3/uL — ABNORMAL HIGH (ref 3.8–10.8)

## 2021-12-07 LAB — LIPID PANEL
Cholesterol: 136 mg/dL (ref <200)
HDL: 48 mg/dL — ABNORMAL LOW (ref >=50)
LDL Cholesterol (Calc): 59 mg/dL (calc)
Non-HDL Cholesterol (Calc): 88 mg/dL (calc) (ref <130)
Total CHOL/HDL Ratio: 2.8 (calc) (ref <5.0)
Triglycerides: 217 mg/dL — ABNORMAL HIGH (ref <150)

## 2021-12-07 LAB — HEMOGLOBIN A1C
Hgb A1c MFr Bld: 9.6 % of total Hgb — ABNORMAL HIGH (ref <5.7)
Mean Plasma Glucose: 229 mg/dL
eAG (mmol/L): 12.7 mmol/L

## 2021-12-07 LAB — MAGNESIUM: Magnesium: 1.5 mg/dL (ref 1.5–2.5)

## 2021-12-07 LAB — TSH: TSH: 2.49 mIU/L (ref 0.40–4.50)

## 2021-12-07 MED ORDER — MAGNESIUM 400 MG PO CAPS: ORAL_CAPSULE | ORAL | Status: DC

## 2021-12-12 ENCOUNTER — Ambulatory Visit (INDEPENDENT_AMBULATORY_CARE_PROVIDER_SITE_OTHER): Payer: Medicare Other | Admitting: Nurse Practitioner

## 2021-12-12 ENCOUNTER — Encounter: Payer: Self-pay | Admitting: Nurse Practitioner

## 2021-12-12 VITALS — BP 143/80 | HR 81 | Temp 97.9°F | Resp 16 | Ht 62.0 in | Wt 185.6 lb

## 2021-12-12 DIAGNOSIS — Z794 Long term (current) use of insulin: Secondary | ICD-10-CM

## 2021-12-12 DIAGNOSIS — E1122 Type 2 diabetes mellitus with diabetic chronic kidney disease: Secondary | ICD-10-CM

## 2021-12-12 DIAGNOSIS — E875 Hyperkalemia: Secondary | ICD-10-CM | POA: Diagnosis not present

## 2021-12-12 DIAGNOSIS — Z5181 Encounter for therapeutic drug level monitoring: Secondary | ICD-10-CM | POA: Diagnosis not present

## 2021-12-12 DIAGNOSIS — N1832 Chronic kidney disease, stage 3b: Secondary | ICD-10-CM

## 2021-12-12 NOTE — Progress Notes (Signed)
Assessment and Plan: ? ?Catherine Wood was seen today for a follow up. ? ?Diagnoses and all orders for this visit: ? ?1. Serum potassium elevated ? ?Continue to stay well hydrated. ?CMP to assess K+ levels. ? ?- COMPLETE METABOLIC PANEL WITH GFR ? ?2. Type 2 diabetes mellitus with stage 3b chronic kidney disease, with long-term current use of insulin ([place]) ? ?Discussed administering 5U and up to 10U Novolin 70/30 insulin if BG continues to remain >200 during breakfast and dinner.   ?Monitor for Hypoglycemia. ?Will assess need to increase Trulicity dose from 5.68 mg to 1.5 mg upon patient RTC in two weeks.   ?Continue to eat a well balanced, low carbohydrate diet. ? ?3. Encounter for therapeutic drug level monitoring ? ?Continue BG Log for review of medication effectiveness. ? ?Contact office for any further questions or concerns. ? ?Further disposition pending results of labs. Discussed med's effects and SE's.   ?Over 20 minutes of exam, counseling, chart review, and critical decision making was performed.  ? ?Future Appointments  ?Date Time Provider [place]  ?01/24/2022  3:00 PM Catherine Comber, NP [facility] None  ?03/21/2022  1:15 PM Rankin, Clent Demark, MD [facility] None  ?05/22/2022  3:30 PM [name], MD [facility] None  ?08/27/2022  3:00 PM [name], MD [facility] None  ?12/11/2022  3:00 PM Catherine Comber, NP [facility] None  ? ? ?------------------------------------------------------------------------------------------------------------------ ? ? ?HPI ?BP (!) 143/80   Pulse 81   Temp 97.9 ?F (36.6 ?C)   Resp 16   Ht '5\' 2"'$  (1.575 m)   Wt 185 lb 9.6 oz (84.2 kg)   SpO2 95%   BMI 33.95 kg/m?  ? ?88 y.o.female accompanied by son presents for evaluation of K+ levels d/t hyperkalemia noted during 12/06/21 annual visit.  She denies any increase use of potassium supplements, potassium rich foods.  Son states he tries to keep her well hydrated by keeping her water filled throughout the day. She does  take Losartan Potassium (Cozaar) 100 mg daily.  BP controlled in office.  Denies any CP, heart palpitations, muscle cramping.   ? ?Son is concerned for elevated BG.  Patient was recently taken off of Glipizide 5 mg TID and started on Trulicity 1.27 mg weekly.  Her 1st injection was administered 4 days ago.  Son reports BG elevated to 426 today, however, this was after eating.   ? ?Past Medical History:  ?Diagnosis Date  ? Arthritis   ? GERD (gastroesophageal reflux disease)   ? HOH (hard of hearing)   ? Hyperlipidemia   ? Hypertension   ? Hypothyroid   ? IDDM (insulin dependent diabetes mellitus)   ? Leg swelling   ? Macular degeneration   ? Shortness of breath dyspnea   ? with exertion  ? Sleep apnea   ? does not wear CPAP  ? Varicose veins   ? Wears glasses   ?  ? ?Allergies  ?Allergen Reactions  ? Detrol [Tolterodine]   ? ? ?Current Outpatient Medications on File Prior to Visit  ?Medication Sig  ? ACCU-CHEK SOFTCLIX LANCETS lancets CHECK BLOOD SUGER UP TO 3 TIMES A DAY  ? aspirin (SB LOW DOSE ASA EC) 81 MG EC tablet Take      1 tablet        Daily  ? Cholecalciferol (VITAMIN D3) 125 MCG (5000 UT) CAPS Take      1 capsule     2 x /day  ? CINNAMON PO Take 1,000 mg by mouth.  ?  Dulaglutide (TRULICITY) 4.94 WH/6.7RF SOPN Inject 0.75 mg into the skin once a week.  ? DULoxetine (CYMBALTA) 30 MG capsule Take  1 capsule  Daily  for Mood & Chronic Pain  ? furosemide (LASIX) 40 MG tablet Take  1 tablet daily in the morning for Fluid Retention /Ankle Swelling  ? levothyroxine (SYNTHROID) 100 MCG tablet Take  1 tablet  Daily  on an empty stomach with only water for 30 minutes & no Antacid meds, Calcium or Magnesium for 4 hours & avoid Biotin  ? losartan (COZAAR) 100 MG tablet Take  1 tablet  Daily  for BP  ? Magnesium 400 MG CAPS Take 1 capsule twice Daily with meals  ? metFORMIN (GLUCOPHAGE) 1000 MG tablet TAKE (1) TABLET TWICE A DAY WITH MEALS (BREAKFAST AND SUPPER)  ? Multiple Vitamins-Minerals (ICAPS) CAPS Takle     1  tablet     Daily for Eyes  ? Omega-3 Fatty Acids (FISH OIL) 1200 MG CAPS Take by mouth daily.  ? rosuvastatin (CRESTOR) 5 MG tablet Take  1 tablet  Daily  for Cholesterol  ? glipiZIDE (GLUCOTROL) 5 MG tablet Take  1/2 to 1 tablet  2 to 3 x /day  with Meals for Diabetes (Patient not taking: Reported on 12/12/2021)  ? ?No current facility-administered medications on file prior to visit.  ? ? ?ROS: all negative except what is noted in the HPI.  ? ?Physical Exam: ? ?BP (!) 143/80   Pulse 81   Temp 97.9 ?F (36.6 ?C)   Resp 16   Ht '5\' 2"'$  (1.575 m)   Wt 185 lb 9.6 oz (84.2 kg)   SpO2 95%   BMI 33.95 kg/m?  ? ?General Appearance: NAD.  Awake, conversant and cooperative. ?Eyes: PERRLA, EOMs intact.  Sclera white.  Conjunctiva without erythema. ?Sinuses: No frontal/maxillary tenderness.  No nasal discharge. Nares patent.  ?ENT/Mouth: Ext aud canals clear.  Bilateral TMs w/DOL and without erythema or bulging. Hearing intact.  Posterior pharynx without swelling or exudate.  Tonsils without swelling or erythema.  ?Neck: Supple.  No masses, nodules or thyromegaly. ?Respiratory: Effort is regular with non-labored breathing. Breath sounds are equal bilaterally without rales, rhonchi, wheezing or stridor.  ?Cardio: RRR with no MRGs. Brisk peripheral pulses without edema.  ?Abdomen: Active BS in all four quadrants.  Soft and non-tender without guarding, rebound tenderness, hernias or masses. ?Lymphatics: Non tender without lymphadenopathy.  ?Musculoskeletal: Full ROM, 5/5 strength, normal ambulation.  No clubbing or cyanosis. ?Skin: Appropriate color for ethnicity. Warm without rashes, lesions, ecchymosis, ulcers.  ?Neuro: CN II-XII grossly normal. Normal muscle tone without cerebellar symptoms and intact sensation.   ?Psych: AO X 3,  appropriate mood and affect, insight and judgment.  ?  ? ?Darrol Jump, NP ?4:21 PM ?[facility] ? ?

## 2021-12-13 ENCOUNTER — Other Ambulatory Visit: Payer: Self-pay | Admitting: Nurse Practitioner

## 2021-12-13 DIAGNOSIS — E871 Hypo-osmolality and hyponatremia: Secondary | ICD-10-CM

## 2021-12-13 LAB — COMPLETE METABOLIC PANEL WITH GFR
AG Ratio: 1.3 (calc) (ref 1.0–2.5)
ALT: 11 U/L (ref 6–29)
AST: 11 U/L (ref 10–35)
Albumin: 3.7 g/dL (ref 3.6–5.1)
Alkaline phosphatase (APISO): 57 U/L (ref 37–153)
BUN/Creatinine Ratio: 25 (calc) — ABNORMAL HIGH (ref 6–22)
BUN: 30 mg/dL — ABNORMAL HIGH (ref 7–25)
CO2: 33 mmol/L — ABNORMAL HIGH (ref 20–32)
Calcium: 10.3 mg/dL (ref 8.6–10.4)
Chloride: 86 mmol/L — ABNORMAL LOW (ref 98–110)
Creat: 1.21 mg/dL — ABNORMAL HIGH (ref 0.60–0.95)
Globulin: 2.9 g/dL (calc) (ref 1.9–3.7)
Glucose, Bld: 398 mg/dL — ABNORMAL HIGH (ref 65–99)
Potassium: 4.7 mmol/L (ref 3.5–5.3)
Sodium: 128 mmol/L — ABNORMAL LOW (ref 135–146)
Total Bilirubin: 0.4 mg/dL (ref 0.2–1.2)
Total Protein: 6.6 g/dL (ref 6.1–8.1)
eGFR: 43 mL/min/{1.73_m2} — ABNORMAL LOW (ref >=60)

## 2021-12-17 ENCOUNTER — Encounter: Payer: Self-pay | Admitting: Nurse Practitioner

## 2021-12-17 ENCOUNTER — Ambulatory Visit (INDEPENDENT_AMBULATORY_CARE_PROVIDER_SITE_OTHER): Payer: Medicare Other | Admitting: Nurse Practitioner

## 2021-12-17 VITALS — BP 118/68 | HR 96 | Temp 97.0°F | Wt 184.0 lb

## 2021-12-17 DIAGNOSIS — M25511 Pain in right shoulder: Secondary | ICD-10-CM

## 2021-12-17 DIAGNOSIS — E669 Obesity, unspecified: Secondary | ICD-10-CM

## 2021-12-17 DIAGNOSIS — E1122 Type 2 diabetes mellitus with diabetic chronic kidney disease: Secondary | ICD-10-CM

## 2021-12-17 DIAGNOSIS — T17320A Food in larynx causing asphyxiation, initial encounter: Secondary | ICD-10-CM

## 2021-12-17 DIAGNOSIS — E871 Hypo-osmolality and hyponatremia: Secondary | ICD-10-CM

## 2021-12-17 DIAGNOSIS — Z794 Long term (current) use of insulin: Secondary | ICD-10-CM | POA: Diagnosis not present

## 2021-12-17 DIAGNOSIS — N1832 Chronic kidney disease, stage 3b: Secondary | ICD-10-CM | POA: Diagnosis not present

## 2021-12-17 MED ORDER — DICLOFENAC SODIUM 1 % EX GEL: CUTANEOUS | 2 refills | Status: DC

## 2021-12-17 NOTE — Progress Notes (Signed)
Assessment and Plan: ? ?Catherine Wood was seen today for a follow up. ? ?Diagnoses and all order for this visit: ? ?1. Hyponatremia ?Continue to stay well hydrated.  ? ?- COMPLETE METABOLIC PANEL WITH GFR ? ?2. Choking due to food (regurgitated), initial encounter ?Discussed slower gastric emptying could increase acid reflux. ?Possible H2 Blocker NOV.  ?Surgical hx review reveals thyroid nodule removal with benign biopsy. ?TSH 2.49 at goal 12/06/21. ?Continue Levothyroxine. ?BS evaluation if s/s fail to improve. ? ?3. Acute pain of right shoulder ?Diclofenac topical cream PRN ?Suggest f/u with Dr. Nelva Bush, Ortho for evaluation of continued steroid injections. ?Possible x-ray for further evaluation if s/s fail to improve. ? ?- diclofenac Sodium (VOLTAREN) 1 % GEL; Apply to affected area up to 4 times daily for pain.  Dispense: 4 g; Refill: 2 ? ?4. Type 2 diabetes mellitus with stage 3b chronic kidney disease, with long-term current use of insulin ([place]) ?Continue Trulicity 7.68 mg weekly at this time. ?Patient only on 2nd week.  ?Patient/son would like to use for 4 weeks before increasing dosage. ?Continue hyperglycemia with breakthrough 70/30 insulin 30-45 U daily PRN. ?Discusses average BG should range between 150-200. ? ?5.  Obesity  ?Continue dietary/lifestyle modifications ?Has lost 1 lb in 1 week. ?Continue Trulicity. ? ? ?Further disposition pending results of labs. Discussed med's effects and SE's.   ?Over 30 minutes of exam, counseling, chart review, and critical decision making was performed.  ? ?Future Appointments  ?Date Time Provider [place]  ?01/24/2022  3:00 PM Liane Comber, NP [facility] None  ?03/21/2022  1:15 PM Rankin, Clent Demark, MD RDE-RDE None  ?05/22/2022  3:30 PM [name], MD [facility] None  ?08/27/2022  3:00 PM [name], MD [facility] None  ?12/11/2022  3:00 PM Liane Comber, NP [facility] None   ? ? ?------------------------------------------------------------------------------------------------------------------ ? ?HPI ?BP 118/68   Pulse 96   Temp (!) 97 ?F (36.1 ?C)   Wt 184 lb (83.5 kg)   SpO2 99%   BMI 33.65 kg/m?  ? ?88 y.o.female presents for re-check on hyponatremia. She ha been drinking Gatorade over the last week. Her hyperkalemia had returned back to normal. ? ?Blood sugars have been trending down.  She is now in the low 200's from the 400's.  During last OV son was asked to administered 5-10U of Novolin 70/30 to bridge while patient was adjusting to Trulicity.  He has been administering up to 30U daily and patient has tolerated well.  She has not had any hypoglycemic episodes.  She has started her 2nd week of Trulicity 1.15 mg this week.  Continues to tolerate well.    Denies N/V, abdominal pain, polyuria, polyphagia.   ? ?Son has noticed x2 episodes of patient getting more "choked" on foods.  Feels as though she is clearing her throat more often.  She reports a hx of thyroid nodule that was removed sometime in the past but does not remember how long ago.  Denies any hx of thyroid cancer.  She does have a hx of thyroid disorder and takes Levothyroxine. Last TSH was WNL at 2.49 12/06/21. ? ?Patient reports increase in right shoulder pain.  Has seen Ortho, Dr. Nelva Bush in the past for lower back steroid injections which she states was not as effective.  She has no known injury.  Has been resting and using a heating pad.   ? ? ?Past Medical History:  ?Diagnosis Date  ? Arthritis   ? GERD (gastroesophageal reflux disease)   ?  HOH (hard of hearing)   ? Hyperlipidemia   ? Hypertension   ? Hypothyroid   ? IDDM (insulin dependent diabetes mellitus)   ? Leg swelling   ? Macular degeneration   ? Shortness of breath dyspnea   ? with exertion  ? Sleep apnea   ? does not wear CPAP  ? Varicose veins   ? Wears glasses   ?  ?Past Surgical History:  ?Procedure Laterality Date  ? ANTERIOR CERVICAL  DECOMP/DISCECTOMY FUSION N/A 11/08/2014  ? Procedure: Cervical three-four,  Cervical six-seven anterior cervical decompression with fusion plating and bonegraft;  Surgeon: Ashok Pall, MD;  Location: [facility];  Service: Neurosurgery;  Laterality: N/A;  Cervical three-four,  Cervical six-seven anterior cervical decompression with fusion plating and bonegraft  ? BIOPSY THYROID    ? benign thyroid nodule removal  ? CATARACT EXTRACTION, BILATERAL    ? COLONOSCOPY W/ BIOPSIES AND POLYPECTOMY    ? ENDOMETRIAL BIOPSY  2000  ? SPINE SURGERY Left  11/08/2014  ? Left ACDF   ? TUBAL LIGATION    ? VARICOSE VEIN SURGERY    ?  ?Allergies  ?Allergen Reactions  ? Detrol [Tolterodine]   ? ? ?Current Outpatient Medications on File Prior to Visit  ?Medication Sig  ? ACCU-CHEK SOFTCLIX LANCETS lancets CHECK BLOOD SUGER UP TO 3 TIMES A DAY  ? aspirin (SB LOW DOSE ASA EC) 81 MG EC tablet Take      1 tablet        Daily  ? Cholecalciferol (VITAMIN D3) 125 MCG (5000 UT) CAPS Take      1 capsule     2 x /day  ? CINNAMON PO Take 1,000 mg by mouth.  ? Dulaglutide (TRULICITY) 3.24 MW/1.0UV SOPN Inject 0.75 mg into the skin once a week.  ? DULoxetine (CYMBALTA) 30 MG capsule Take  1 capsule  Daily  for Mood & Chronic Pain  ? furosemide (LASIX) 40 MG tablet Take  1 tablet daily in the morning for Fluid Retention /Ankle Swelling  ? glipiZIDE (GLUCOTROL) 5 MG tablet Take  1/2 to 1 tablet  2 to 3 x /day  with Meals for Diabetes  ? levothyroxine (SYNTHROID) 100 MCG tablet Take  1 tablet  Daily  on an empty stomach with only water for 30 minutes & no Antacid meds, Calcium or Magnesium for 4 hours & avoid Biotin  ? losartan (COZAAR) 100 MG tablet Take  1 tablet  Daily  for BP  ? Magnesium 400 MG CAPS Take 1 capsule twice Daily with meals  ? metFORMIN (GLUCOPHAGE) 1000 MG tablet TAKE (1) TABLET TWICE A DAY WITH MEALS (BREAKFAST AND SUPPER)  ? Multiple Vitamins-Minerals (ICAPS) CAPS Takle     1 tablet     Daily for Eyes  ? Omega-3 Fatty Acids (FISH  OIL) 1200 MG CAPS Take by mouth daily.  ? rosuvastatin (CRESTOR) 5 MG tablet Take  1 tablet  Daily  for Cholesterol  ? ?No current facility-administered medications on file prior to visit.  ? ? ?ROS: all negative except what is noted in the HPI.  ? ?Physical Exam: ? ?BP 118/68   Pulse 96   Temp (!) 97 ?F (36.1 ?C)   Wt 184 lb (83.5 kg)   SpO2 99%   BMI 33.65 kg/m?  ? ?General Appearance: NAD.  Awake, conversant and cooperative. ?Eyes: PERRLA, EOMs intact.  Sclera white.  Conjunctiva without erythema. ?Sinuses: No frontal/maxillary tenderness.  No nasal discharge. Nares patent.  ?ENT/Mouth:  Ext aud canals clear.  Bilateral TMs w/DOL and without erythema or bulging. Hearing intact.  Posterior pharynx without swelling or exudate.  Tonsils without swelling or erythema.  ?Neck: Supple.  No masses, nodules or thyromegaly. ?Respiratory: Effort is regular with non-labored breathing. Breath sounds are equal bilaterally without rales, rhonchi, wheezing or stridor.  ?Cardio: RRR with no MRGs. Brisk peripheral pulses without edema.  ?Abdomen: Active BS in all four quadrants.  Soft and non-tender without guarding, rebound tenderness, hernias or masses. ?Lymphatics: Non tender without lymphadenopathy.  ?Musculoskeletal: LROM d/t pain.  Tenderness along AC joint.   ?Skin: Appropriate color for ethnicity. Warm without rashes, lesions, ecchymosis, ulcers.  ?Neuro: CN II-XII grossly normal. Normal muscle tone without cerebellar symptoms and intact sensation.   ?Psych: AO X 3,  appropriate mood and affect, insight and judgment.  ?  ? ?Darrol Jump, NP ?4:20 PM ?Catherine Wood ? ?

## 2021-12-18 ENCOUNTER — Other Ambulatory Visit: Payer: Self-pay | Admitting: Nurse Practitioner

## 2021-12-18 DIAGNOSIS — E871 Hypo-osmolality and hyponatremia: Secondary | ICD-10-CM

## 2021-12-18 LAB — COMPLETE METABOLIC PANEL WITH GFR
AG Ratio: 1.2 (calc) (ref 1.0–2.5)
ALT: 13 U/L (ref 6–29)
AST: 15 U/L (ref 10–35)
Albumin: 3.7 g/dL (ref 3.6–5.1)
Alkaline phosphatase (APISO): 61 U/L (ref 37–153)
BUN/Creatinine Ratio: 23 (calc) — ABNORMAL HIGH (ref 6–22)
BUN: 24 mg/dL (ref 7–25)
CO2: 32 mmol/L (ref 20–32)
Calcium: 10.1 mg/dL (ref 8.6–10.4)
Chloride: 90 mmol/L — ABNORMAL LOW (ref 98–110)
Creat: 1.03 mg/dL — ABNORMAL HIGH (ref 0.60–0.95)
Globulin: 3.1 g/dL (calc) (ref 1.9–3.7)
Glucose, Bld: 219 mg/dL — ABNORMAL HIGH (ref 65–99)
Potassium: 5 mmol/L (ref 3.5–5.3)
Sodium: 132 mmol/L — ABNORMAL LOW (ref 135–146)
Total Bilirubin: 0.4 mg/dL (ref 0.2–1.2)
Total Protein: 6.8 g/dL (ref 6.1–8.1)
eGFR: 52 mL/min/{1.73_m2} — ABNORMAL LOW (ref >=60)

## 2021-12-27 ENCOUNTER — Ambulatory Visit: Payer: Medicare Other

## 2021-12-27 ENCOUNTER — Encounter: Payer: Self-pay | Admitting: Adult Health

## 2021-12-27 ENCOUNTER — Ambulatory Visit (INDEPENDENT_AMBULATORY_CARE_PROVIDER_SITE_OTHER): Payer: Medicare Other | Admitting: Adult Health

## 2021-12-27 VITALS — BP 98/58 | HR 66 | Temp 97.7°F | Wt 182.0 lb

## 2021-12-27 DIAGNOSIS — E871 Hypo-osmolality and hyponatremia: Secondary | ICD-10-CM

## 2021-12-27 DIAGNOSIS — E1122 Type 2 diabetes mellitus with diabetic chronic kidney disease: Secondary | ICD-10-CM

## 2021-12-27 DIAGNOSIS — E1165 Type 2 diabetes mellitus with hyperglycemia: Secondary | ICD-10-CM | POA: Diagnosis not present

## 2021-12-27 DIAGNOSIS — Z794 Long term (current) use of insulin: Secondary | ICD-10-CM

## 2021-12-27 DIAGNOSIS — N1832 Chronic kidney disease, stage 3b: Secondary | ICD-10-CM

## 2021-12-27 MED ORDER — INSULIN PEN NEEDLE 32G X 4 MM MISC: 3 refills | Status: DC

## 2021-12-27 MED ORDER — LANTUS SOLOSTAR 100 UNIT/ML ~~LOC~~ SOPN: PEN_INJECTOR | SUBCUTANEOUS | 11 refills | Status: DC

## 2021-12-27 NOTE — Progress Notes (Signed)
Assessment and Plan: ? ?Diagnoses and all orders for this visit: ? ?Type 2 diabetes mellitus with stage 3b chronic kidney disease, with long-term current use of insulin ([place]) ?Type 2 diabetes mellitus with hyperglycemia, with long-term current use of insulin ([place]) ?Insulin dose changed ([place]) ?Attempting to simplify regimen, labile glucose, hyperglycemia ?Continue metformin 1000 mg BID, glipizide 5 mg TID (question if any benefit) ?Off of novolin 70/30 to simplify regimen but hyperglycemia ?Discussed and sent in long acting insulin glargine; start 20 units at night and increase by 2 units up to 34 units until fasting <130 ?Add OTC pepcid BID for reflux since starting trulicity 7.61 mg, continue this dose for now ?Close follow up in 2 weeks or contact sooner if severe hyperglycemia or any hypoglycemia  ?-     insulin glargine (LANTUS SOLOSTAR) 100 UNIT/ML Solostar Pen; Start by injecting 20 units into skin of stomach nightly. Increase by 2 units each night until fasting is <130 or you are taking 34 units. ?-     Insulin Pen Needle 32G X 4 MM MISC; Use to inject daily insulin as directed. ? ?Discussed med's effects and SE's.   ?Over 15 minutes of exam, counseling, chart review, and critical decision making was performed.  ? ?Future Appointments  ?Date Time Provider [place]  ?01/24/2022  3:00 PM Liane Comber, NP [facility] None  ?03/21/2022  1:15 PM Rankin, Clent Demark, MD RDE-RDE None  ?05/22/2022  3:30 PM [name], MD [facility] None  ?08/27/2022  3:00 PM [name], MD [facility] None  ?12/11/2022  3:00 PM Liane Comber, NP [facility] None  ? ? ?------------------------------------------------------------------------------------------------------------------ ? ? ?HPI ?BP (!) 98/58   Pulse 66   Temp 97.7 ?F (36.5 ?C)   Wt 182 lb (82.6 kg)   SpO2 99%   BMI 33.29 kg/m?  ?88 y.o.female with T2DM presents for recheck potassium/sodium as recommended by Darrol Jump, NP at our office. Switched to  OV due to reports of persistently elevated glucose. Son who manages her meds accompanies her and presents with log demonstrating fasting glucose ranging 113 -372, other pre - meal checks as high as 435.  ? ?Currently taking metformin 1000 mg BID, glipizide 5 mg TID, newly on trulicity 6.07 mg weekly shots (on week 3, having reflux), trying to come off of previous novolin 70/30 due to lability, complication with hypoglycemia in the past, son manages for her and will be leaving town in 6 weeks, trying to simplify regimen so his sister can manage. Son has been administering 35 units of insulin for glucose 300+, 6/7 days in the last week.  ? ?Past Medical History:  ?Diagnosis Date  ? Arthritis   ? GERD (gastroesophageal reflux disease)   ? HOH (hard of hearing)   ? Hyperlipidemia   ? Hypertension   ? Hypothyroid   ? IDDM (insulin dependent diabetes mellitus)   ? Leg swelling   ? Macular degeneration   ? Shortness of breath dyspnea   ? with exertion  ? Sleep apnea   ? does not wear CPAP  ? Varicose veins   ? Wears glasses   ?  ? ?Allergies  ?Allergen Reactions  ? Detrol [Tolterodine]   ? ? ?Current Outpatient Medications on File Prior to Visit  ?Medication Sig  ? ACCU-CHEK SOFTCLIX LANCETS lancets CHECK BLOOD SUGER UP TO 3 TIMES A DAY  ? aspirin (SB LOW DOSE ASA EC) 81 MG EC tablet Take      1 tablet  Daily  ? Cholecalciferol (VITAMIN D3) 125 MCG (5000 UT) CAPS Take      1 capsule     2 x /day  ? CINNAMON PO Take 1,000 mg by mouth.  ? diclofenac Sodium (VOLTAREN) 1 % GEL Apply to affected area up to 4 times daily for pain.  ? Dulaglutide (TRULICITY) 9.86 YO/3.7CH SOPN Inject 0.75 mg into the skin once a week.  ? DULoxetine (CYMBALTA) 30 MG capsule Take  1 capsule  Daily  for Mood & Chronic Pain  ? furosemide (LASIX) 40 MG tablet Take  1 tablet daily in the morning for Fluid Retention /Ankle Swelling  ? glipiZIDE (GLUCOTROL) 5 MG tablet Take  1/2 to 1 tablet  2 to 3 x /day  with Meals for Diabetes  ? levothyroxine  (SYNTHROID) 100 MCG tablet Take  1 tablet  Daily  on an empty stomach with only water for 30 minutes & no Antacid meds, Calcium or Magnesium for 4 hours & avoid Biotin  ? losartan (COZAAR) 100 MG tablet Take  1 tablet  Daily  for BP  ? Magnesium 400 MG CAPS Take 1 capsule twice Daily with meals  ? metFORMIN (GLUCOPHAGE) 1000 MG tablet TAKE (1) TABLET TWICE A DAY WITH MEALS (BREAKFAST AND SUPPER)  ? Multiple Vitamins-Minerals (ICAPS) CAPS Takle     1 tablet     Daily for Eyes  ? Omega-3 Fatty Acids (FISH OIL) 1200 MG CAPS Take by mouth daily.  ? rosuvastatin (CRESTOR) 5 MG tablet Take  1 tablet  Daily  for Cholesterol  ? ?No current facility-administered medications on file prior to visit.  ? ? ?ROS: all negative except above.  ? ?Physical Exam: ? ?BP (!) 98/58   Pulse 66   Temp 97.7 ?F (36.5 ?C)   Wt 182 lb (82.6 kg)   SpO2 99%   BMI 33.29 kg/m?  ? ?General Appearance: Well nourished, well dressed elder female in wheelchair in no apparent distress. ?Eyes: conjunctiva no swelling or erythema ?ENT/Mouth: Mildly HOH.  ?Neck: Supple ?Respiratory: Respiratory effort normal ?Cardio: Appears well perfused ?Musculoskeletal: in wheelchair ?Psych: Awake and oriented X 2, normal affect, Insight and Judgment poor ? ?Izora Ribas, NP ?5:10 PM ?[facility] ? ?

## 2021-12-28 ENCOUNTER — Other Ambulatory Visit: Payer: Self-pay | Admitting: Internal Medicine

## 2021-12-28 LAB — COMPLETE METABOLIC PANEL WITH GFR
AG Ratio: 1.2 (calc) (ref 1.0–2.5)
ALT: 13 U/L (ref 6–29)
AST: 14 U/L (ref 10–35)
Albumin: 3.6 g/dL (ref 3.6–5.1)
Alkaline phosphatase (APISO): 60 U/L (ref 37–153)
BUN: 21 mg/dL (ref 7–25)
CO2: 31 mmol/L (ref 20–32)
Calcium: 9.9 mg/dL (ref 8.6–10.4)
Chloride: 89 mmol/L — ABNORMAL LOW (ref 98–110)
Creat: 0.92 mg/dL (ref 0.60–0.95)
Globulin: 3 g/dL (calc) (ref 1.9–3.7)
Glucose, Bld: 322 mg/dL — ABNORMAL HIGH (ref 65–99)
Potassium: 4.8 mmol/L (ref 3.5–5.3)
Sodium: 131 mmol/L — ABNORMAL LOW (ref 135–146)
Total Bilirubin: 0.4 mg/dL (ref 0.2–1.2)
Total Protein: 6.6 g/dL (ref 6.1–8.1)
eGFR: 60 mL/min/{1.73_m2} (ref >=60)

## 2022-01-10 ENCOUNTER — Ambulatory Visit (INDEPENDENT_AMBULATORY_CARE_PROVIDER_SITE_OTHER): Payer: Medicare Other | Admitting: Adult Health

## 2022-01-10 ENCOUNTER — Encounter: Payer: Self-pay | Admitting: Adult Health

## 2022-01-10 VITALS — BP 118/68 | HR 90 | Temp 97.9°F | Resp 16 | Ht 62.0 in | Wt 179.8 lb

## 2022-01-10 DIAGNOSIS — E1165 Type 2 diabetes mellitus with hyperglycemia: Secondary | ICD-10-CM

## 2022-01-10 DIAGNOSIS — Z794 Long term (current) use of insulin: Secondary | ICD-10-CM

## 2022-01-10 DIAGNOSIS — N1832 Chronic kidney disease, stage 3b: Secondary | ICD-10-CM

## 2022-01-10 DIAGNOSIS — E1122 Type 2 diabetes mellitus with diabetic chronic kidney disease: Secondary | ICD-10-CM

## 2022-01-10 MED ORDER — TRULICITY 1.5 MG/0.5ML ~~LOC~~ SOAJ: 1.5000 mg | SUBCUTANEOUS | 3 refills | Status: DC

## 2022-01-10 MED ORDER — LANTUS SOLOSTAR 100 UNIT/ML ~~LOC~~ SOPN: PEN_INJECTOR | SUBCUTANEOUS | 11 refills | Status: DC

## 2022-01-10 NOTE — Progress Notes (Signed)
Assessment and Plan: ? ?Diagnoses and all orders for this visit: ? ?Type 2 diabetes mellitus with stage 3b chronic kidney disease, with long-term current use of insulin ([place]) ?Type 2 diabetes mellitus with hyperglycemia, with long-term current use of insulin ([place]) ?Insulin dose changed ([place]) ?Attempting to simplify regimen, labile glucose, hyperglycemia ?Continue metformin 1000 mg BID, glipizide 5 mg TID for now -  ?STOP novolin 70/30, start insulin glargine 25 units, increase by 2 units daily until 35 units or fasting <150 ?Increase trulicity from 9.48 mg to 1.5 mg weekly  ?Close follow up in 2 weeks or contact sooner if severe hyperglycemia or any hypoglycemia  ?-     insulin glargine (LANTUS SOLOSTAR) 100 UNIT/ML Solostar Pen; Start by injecting 25 units into skin of stomach nightly. Increase by 2 units each night until fasting is <150 or you are taking 35 units. ?-     Dulaglutide (TRULICITY) 1.5 NI/6.2VO SOPN; Inject 1.5 mg into the skin once a week. ? ?Discussed med's effects and SE's.   ?Over 20 minutes of exam, counseling, chart review, and critical decision making was performed.  ? ?Future Appointments  ?Date Time Provider [place]  ?01/24/2022  3:00 PM Liane Comber, NP [facility] None  ?03/21/2022  1:15 PM Rankin, Clent Demark, MD RDE-RDE None  ?05/22/2022  3:30 PM [name], MD [facility] None  ?08/27/2022  3:00 PM [name], MD [facility] None  ?12/11/2022  3:00 PM Liane Comber, NP [facility] None  ? ? ?------------------------------------------------------------------------------------------------------------------ ? ? ?HPI ?BP 118/68   Pulse 90   Temp 97.9 ?F (36.6 ?C)   Resp 16   Ht _0  (1.575 m)   Wt 179 lb 12.8 oz (81.6 kg)   SpO2 96%   BMI 32.89 kg/m?  ?88 y.o.female with T2DM presents 2 week follow up after regimen change due to significant hyperglycemia.  ? ?Son who manages her meds accompanies her and reports post prandial checks still 300-400+ range. He gives most of  hx due to patient with dementia, she answers simple questions.  ? ?Was taking metformin 1000 mg BID, glipizide 5 mg TID, newly on trulicity 3.50 mg weekly shots (was having reflux, improved), trying to come off of previous novolin 70/30 due to lability, complication with hypoglycemia in the past, son manages for her and will be leaving town in 3 weeks, trying to simplify regimen so his sister can manage. Son had been administering 35 units of insulin for glucose 300+, 6/7 days in the previous week. 2 weeks ago sent in lantus and advised to start 20 units with taper plan. Today it is apparent there was a miscommunication, never started new insulin. He reports has still been giving novolin 70/30 35-40 units most days.  ? ?Lab Results  ?Component Value Date  ? HGBA1C 9.6 (H) 12/06/2021  ? ?Lab Results  ?Component Value Date  ? EGFR 60 12/27/2021  ? ?BMI is Body mass index is 32.89 kg/m?., Son has noted she has started losing some weight since on trulicity, less snacking - still eating regular meals ?Wt Readings from Last 3 Encounters:  ?01/10/22 179 lb 12.8 oz (81.6 kg)  ?12/27/21 182 lb (82.6 kg)  ?12/27/21 182 lb (82.6 kg)  ? ? ? ?Past Medical History:  ?Diagnosis Date  ? Arthritis   ? GERD (gastroesophageal reflux disease)   ? HOH (hard of hearing)   ? Hyperlipidemia   ? Hypertension   ? Hypothyroid   ? IDDM (insulin dependent diabetes mellitus)   ? Leg  swelling   ? Macular degeneration   ? Shortness of breath dyspnea   ? with exertion  ? Sleep apnea   ? does not wear CPAP  ? Varicose veins   ? Wears glasses   ?  ? ?Allergies  ?Allergen Reactions  ? Detrol [Tolterodine]   ? ? ?Current Outpatient Medications on File Prior to Visit  ?Medication Sig  ? ACCU-CHEK SOFTCLIX LANCETS lancets CHECK BLOOD SUGER UP TO 3 TIMES A DAY  ? aspirin (SB LOW DOSE ASA EC) 81 MG EC tablet Take      1 tablet        Daily  ? Cholecalciferol (VITAMIN D3) 125 MCG (5000 UT) CAPS Take      1 capsule     2 x /day  ? CINNAMON PO Take 1,000 mg  by mouth.  ? diclofenac Sodium (VOLTAREN) 1 % GEL Apply to affected area up to 4 times daily for pain.  ? DULoxetine (CYMBALTA) 30 MG capsule Take  1 capsule  Daily  for Mood & Chronic Pain  ? furosemide (LASIX) 40 MG tablet Take  1 tablet daily in the morning for Fluid Retention /Ankle Swelling  ? glipiZIDE (GLUCOTROL) 5 MG tablet Take  1/2 to 1 tablet  2 to 3 x /day  with Meals for Diabetes  ? Insulin Pen Needle 32G X 4 MM MISC Use to inject daily insulin as directed.  ? levothyroxine (SYNTHROID) 100 MCG tablet TAKE (1) TABLET DAILY BE- FORE BREAKFAST.  ? losartan (COZAAR) 100 MG tablet Take  1 tablet  Daily  for BP  ? Magnesium 400 MG CAPS Take 1 capsule twice Daily with meals  ? metFORMIN (GLUCOPHAGE) 1000 MG tablet TAKE (1) TABLET TWICE A DAY WITH MEALS (BREAKFAST AND SUPPER)  ? Multiple Vitamins-Minerals (ICAPS) CAPS Takle     1 tablet     Daily for Eyes  ? Omega-3 Fatty Acids (FISH OIL) 1200 MG CAPS Take by mouth daily.  ? rosuvastatin (CRESTOR) 5 MG tablet Take  1 tablet  Daily  for Cholesterol  ? ?No current facility-administered medications on file prior to visit.  ? ? ?ROS: all negative except above.  ? ?Physical Exam: ? ?BP 118/68   Pulse 90   Temp 97.9 ?F (36.6 ?C)   Resp 16   Ht _0  (1.575 m)   Wt 179 lb 12.8 oz (81.6 kg)   SpO2 96%   BMI 32.89 kg/m?  ? ?General Appearance: Well nourished, well dressed elder female in wheelchair in no apparent distress. ?Eyes: conjunctiva no swelling or erythema ?ENT/Mouth: Mildly HOH.  ?Neck: Supple ?Respiratory: Respiratory effort normal ?Cardio: Appears well perfused ?Musculoskeletal: in wheelchair ?Psych: Awake and oriented X 2, normal affect, Insight and Judgment poor ? ?Izora Ribas, NP ?3:36 PM ?[facility] ? ?

## 2022-01-15 ENCOUNTER — Telehealth: Payer: Self-pay | Admitting: Adult Health

## 2022-01-15 NOTE — Telephone Encounter (Signed)
Patient's son says that since starting the Trulicity she has absolutely no appetite. He is unable to get her to eat much of anything. Her sugars are also still running in the 200-300 range. Please advise.  ?

## 2022-01-16 NOTE — Telephone Encounter (Signed)
Left message on voice mail  to call back

## 2022-01-17 NOTE — Telephone Encounter (Signed)
Tommy, patients son states that he has been giving her both the trulicity and lantus, '30mg'$  a day. Last Trulicity injection was given on Sunday. Has been severely constipated, has been drinking plenty of fluids and was given Miralax which hopefully will help.  ?Please advise ?

## 2022-01-18 ENCOUNTER — Other Ambulatory Visit: Payer: Self-pay | Admitting: Adult Health

## 2022-01-18 ENCOUNTER — Other Ambulatory Visit: Payer: Self-pay | Admitting: Internal Medicine

## 2022-01-18 DIAGNOSIS — Z79899 Other long term (current) drug therapy: Secondary | ICD-10-CM

## 2022-01-18 DIAGNOSIS — I10 Essential (primary) hypertension: Secondary | ICD-10-CM

## 2022-01-18 MED ORDER — DOCUSATE SODIUM 100 MG PO CAPS: 100.0000 mg | ORAL_CAPSULE | Freq: Two times a day (BID) | ORAL | 1 refills | Status: DC | PRN

## 2022-01-21 NOTE — Telephone Encounter (Signed)
Patient has an appointment on the 18th ?

## 2022-01-24 ENCOUNTER — Ambulatory Visit (INDEPENDENT_AMBULATORY_CARE_PROVIDER_SITE_OTHER): Payer: Medicare Other | Admitting: Adult Health

## 2022-01-24 ENCOUNTER — Encounter: Payer: Self-pay | Admitting: Adult Health

## 2022-01-24 VITALS — BP 112/60 | HR 75 | Temp 97.2°F | Wt 178.0 lb

## 2022-01-24 DIAGNOSIS — N1832 Chronic kidney disease, stage 3b: Secondary | ICD-10-CM | POA: Diagnosis not present

## 2022-01-24 DIAGNOSIS — Z794 Long term (current) use of insulin: Secondary | ICD-10-CM

## 2022-01-24 DIAGNOSIS — E1122 Type 2 diabetes mellitus with diabetic chronic kidney disease: Secondary | ICD-10-CM

## 2022-01-24 DIAGNOSIS — Z79899 Other long term (current) drug therapy: Secondary | ICD-10-CM

## 2022-01-24 DIAGNOSIS — E1165 Type 2 diabetes mellitus with hyperglycemia: Secondary | ICD-10-CM

## 2022-01-24 DIAGNOSIS — K5903 Drug induced constipation: Secondary | ICD-10-CM | POA: Diagnosis not present

## 2022-01-24 MED ORDER — LANTUS SOLOSTAR 100 UNIT/ML ~~LOC~~ SOPN: PEN_INJECTOR | SUBCUTANEOUS | 11 refills | Status: DC

## 2022-01-24 NOTE — Progress Notes (Signed)
Assessment and Plan:  Diagnoses and all orders for this visit:  Poorly controlled T2DM ([place]) Type 2 diabetes mellitus with hyperglycemia, with long-term current use of insulin (HCC) Insulin dose changed ([place])  Attempting to simplify regimen, labile glucose, hyperglycemia/poglycemia Continue metformin 0300 mg BID, trulicity 1.5 mg weekly - tolerating well with famotidine/miralax Insulin glargine 30 units for now as glucose reportedly trending down  Son will be leaving for several weeks, will not titrate aggressively as his sister who has limited understanding will be managing her meds Follow up if any concerns with glucose range Once son is back from travel would be reasonable to attempt trulicity 3 mg/week OR can increase basal insulin further for fasting goal <130 -     insulin glargine (LANTUS SOLOSTAR) 100 UNIT/ML Solostar Pen; Inject 30 units into skin of stomach every morning. -     Dulaglutide (TRULICITY) 1.5 PQ/3.3AQ SOPN; Inject 1.5 mg into the skin once a week.  Discussed med's effects and SE's.   Over 20 minutes of exam, counseling, chart review, and critical decision making was performed.   Future Appointments  Date Time Provider [place]  03/21/2022  1:15 PM Rankin, Clent Demark, MD [facility] None  05/22/2022  3:30 PM [name], MD [facility] None  08/27/2022  3:00 PM [name], MD [facility] None  12/11/2022  3:00 PM Liane Comber, NP [facility] None    ------------------------------------------------------------------------------------------------------------------   HPI BP 112/60   Pulse 75   Temp (!) 97.2 F (36.2 C)   Wt 178 lb (80.7 kg)   SpO2 99%   BMI 32.56 kg/m  88 y.o.female with T2DM presents 2 week follow up after regimen change due to significant hyperglycemia.   Son who manages her meds accompanies her reports she is doing much better, less 300-400 mg and recently 160-170s up to low 200s (can't guarantee fasting, she often gets up  overnight to snack). He gives most of hx due to patient with dementia, she answers simple questions, denies any concerns.   Now taking metformin 1000 mg BID, newly on trulicity 1.5 mg weekly shots (constipation improved on daily miralax, famotidine for reflux), off of novolin 70/30 and now on insulin glargine, titrated up to 30 units. He has noted general trend down in glucose and felt best not to titrate basal insulin up further.   Hx of labile glucose, hypoglycemia on novolin 70/30.   Lab Results  Component Value Date   HGBA1C 9.6 (H) 12/06/2021   Lab Results  Component Value Date   EGFR 60 12/27/2021   BMI is Body mass index is 32.56 kg/m., Son has noted she has started losing some weight since on trulicity, less snacking - still eating regular meals Wt Readings from Last 3 Encounters:  01/24/22 178 lb (80.7 kg)  01/10/22 179 lb 12.8 oz (81.6 kg)  12/27/21 182 lb (82.6 kg)    Past Medical History:  Diagnosis Date   Arthritis    GERD (gastroesophageal reflux disease)    HOH (hard of hearing)    Hyperlipidemia    Hypertension    Hypothyroid    IDDM (insulin dependent diabetes mellitus)    Leg swelling    Macular degeneration    Shortness of breath dyspnea    with exertion   Sleep apnea    does not wear CPAP   Varicose veins    Wears glasses      Allergies  Allergen Reactions   Detrol [Tolterodine]     Current Outpatient Medications on File  Prior to Visit  Medication Sig   ACCU-CHEK SOFTCLIX LANCETS lancets CHECK BLOOD SUGER UP TO 3 TIMES A DAY   aspirin (SB LOW DOSE ASA EC) 81 MG EC tablet Take      1 tablet        Daily   CALCIUM-MAGNESIUM-ZINC PO Take 1 tablet by mouth daily.   Cholecalciferol (VITAMIN D3) 125 MCG (5000 UT) CAPS Take      1 capsule     2 x /day   diclofenac Sodium (VOLTAREN) 1 % GEL Apply to affected area up to 4 times daily for pain.   Dulaglutide (TRULICITY) 1.5 WT/8.8EK SOPN Inject 1.5 mg into the skin once a week.   DULoxetine (CYMBALTA)  30 MG capsule Take  1 capsule  Daily  for Mood & Chronic Pain   famotidine (PEPCID) 20 MG tablet Take 20 mg by mouth daily.   furosemide (LASIX) 40 MG tablet TAKE ONE TABLET TWICE DAILY FOR FLUID RETENTION/ANKLE SWELLING   Insulin Pen Needle 32G X 4 MM MISC Use to inject daily insulin as directed.   levothyroxine (SYNTHROID) 100 MCG tablet TAKE (1) TABLET DAILY BE- FORE BREAKFAST.   losartan (COZAAR) 100 MG tablet Take  1 tablet  Daily  for BP   Magnesium 400 MG CAPS Take 1 capsule twice Daily with meals   metFORMIN (GLUCOPHAGE) 1000 MG tablet TAKE (1) TABLET TWICE A DAY WITH MEALS (BREAKFAST AND SUPPER)   Multiple Vitamins-Minerals (ICAPS) CAPS Takle     1 tablet     Daily for Eyes   Polyethylene Glycol 3350 (MIRALAX PO) Take 1 Scoop by mouth daily.   rosuvastatin (CRESTOR) 5 MG tablet Take  1 tablet  Daily  for Cholesterol   vitamin B-12 (CYANOCOBALAMIN) 1000 MCG tablet Take 1,000 mcg by mouth daily.   No current facility-administered medications on file prior to visit.    ROS: all negative except above.   Physical Exam:  BP 112/60   Pulse 75   Temp (!) 97.2 F (36.2 C)   Wt 178 lb (80.7 kg)   SpO2 99%   BMI 32.56 kg/m   General Appearance: Well nourished, well dressed elder female in wheelchair in no apparent distress. Eyes: conjunctiva no swelling or erythema ENT/Mouth: Mildly HOH.  Neck: Supple Respiratory: Respiratory effort normal Cardio: Appears well perfused Musculoskeletal: in wheelchair Psych: Awake and oriented X 2, normal affect, Insight and Judgment poor  Izora Ribas, NP 3:33 PM [facility]

## 2022-01-31 ENCOUNTER — Encounter (INDEPENDENT_AMBULATORY_CARE_PROVIDER_SITE_OTHER): Payer: Medicare Other | Admitting: Ophthalmology

## 2022-02-05 ENCOUNTER — Other Ambulatory Visit: Payer: Self-pay | Admitting: Internal Medicine

## 2022-03-21 ENCOUNTER — Ambulatory Visit (INDEPENDENT_AMBULATORY_CARE_PROVIDER_SITE_OTHER): Payer: Medicare Other | Admitting: Ophthalmology

## 2022-03-21 ENCOUNTER — Encounter (INDEPENDENT_AMBULATORY_CARE_PROVIDER_SITE_OTHER): Payer: Self-pay | Admitting: Ophthalmology

## 2022-03-21 DIAGNOSIS — H353134 Nonexudative age-related macular degeneration, bilateral, advanced atrophic with subfoveal involvement: Secondary | ICD-10-CM

## 2022-03-21 DIAGNOSIS — H353221 Exudative age-related macular degeneration, left eye, with active choroidal neovascularization: Secondary | ICD-10-CM | POA: Diagnosis not present

## 2022-03-21 DIAGNOSIS — H353211 Exudative age-related macular degeneration, right eye, with active choroidal neovascularization: Secondary | ICD-10-CM

## 2022-03-21 MED ORDER — BEVACIZUMAB 2.5 MG/0.1ML IZ SOSY
2.5000 mg | PREFILLED_SYRINGE | INTRAVITREAL | Status: AC | PRN
  Administered 2022-03-21: 2.5 mg via INTRAVITREAL

## 2022-03-21 NOTE — Assessment & Plan Note (Signed)
OD today is 5 months since last treatment.

## 2022-03-21 NOTE — Assessment & Plan Note (Signed)
Subfoveal geographic atrophy in the past

## 2022-03-21 NOTE — Assessment & Plan Note (Signed)
OS, last treatment and evaluation 3 months previous.

## 2022-03-21 NOTE — Progress Notes (Signed)
03/21/2022     CHIEF COMPLAINT Patient presents for  Chief Complaint  Patient presents with   Macular Degeneration      HISTORY OF PRESENT ILLNESS: Catherine Wood is a 88 y.o. female who presents to the clinic today for:   HPI   History of bilateral wet AMD.  Left eye was last treated 3 months previous and maintenance of good acuity  Right eye last treated 5 months previous  No change in quality of vision Last edited by Hurman Horn, MD on 03/21/2022  1:30 PM.      Referring physician: [name], MD [address] [place],  [place] [address]  HISTORICAL INFORMATION:   Selected notes from the [place]    Lab Results  Component Value Date   HGBA1C 9.6 (H) 12/06/2021     CURRENT MEDICATIONS: No current outpatient medications on file. (Ophthalmic Drugs)   No current facility-administered medications for this visit. (Ophthalmic Drugs)   Current Outpatient Medications (Other)  Medication Sig   ACCU-CHEK SOFTCLIX LANCETS lancets CHECK BLOOD SUGER UP TO 3 TIMES A DAY   aspirin (SB LOW DOSE ASA EC) 81 MG EC tablet Take      1 tablet        Daily   CALCIUM-MAGNESIUM-ZINC PO Take 1 tablet by mouth daily.   Cholecalciferol (VITAMIN D3) 125 MCG (5000 UT) CAPS Take      1 capsule     2 x /day   diclofenac Sodium (VOLTAREN) 1 % GEL Apply to affected area up to 4 times daily for pain.   Dulaglutide (TRULICITY) 1.5 UE/4.5WU SOPN Inject 1.5 mg into the skin once a week.   DULoxetine (CYMBALTA) 30 MG capsule TAKE 1 CAPSULE ONCE DAILY FOR MOOD   famotidine (PEPCID) 20 MG tablet Take 20 mg by mouth daily.   furosemide (LASIX) 40 MG tablet TAKE ONE TABLET TWICE DAILY FOR FLUID RETENTION/ANKLE SWELLING   insulin glargine (LANTUS SOLOSTAR) 100 UNIT/ML Solostar Pen Inject 30 units into skin once daily in the morning for diabetes.   Insulin Pen Needle 32G X 4 MM MISC Use to inject daily insulin as directed.   levothyroxine (SYNTHROID) 100 MCG tablet  TAKE (1) TABLET DAILY BE- FORE BREAKFAST.   losartan (COZAAR) 100 MG tablet Take  1 tablet  Daily  for BP   Magnesium 400 MG CAPS Take 1 capsule twice Daily with meals   metFORMIN (GLUCOPHAGE) 1000 MG tablet TAKE (1) TABLET TWICE A DAY WITH MEALS (BREAKFAST AND SUPPER)   Multiple Vitamins-Minerals (ICAPS) CAPS Takle     1 tablet     Daily for Eyes   Polyethylene Glycol 3350 (MIRALAX PO) Take 1 Scoop by mouth daily.   rosuvastatin (CRESTOR) 5 MG tablet Take  1 tablet  Daily  for Cholesterol   vitamin B-12 (CYANOCOBALAMIN) 1000 MCG tablet Take 1,000 mcg by mouth daily.   No current facility-administered medications for this visit. (Other)      REVIEW OF SYSTEMS: ROS   Negative for: Constitutional, Gastrointestinal, Neurological, Skin, Genitourinary, Musculoskeletal, HENT, Endocrine, Cardiovascular, Eyes, Respiratory, Psychiatric, Allergic/Imm, Heme/Lymph Last edited by Hurman Horn, MD on 03/21/2022  1:25 PM.       ALLERGIES Allergies  Allergen Reactions   Detrol [Tolterodine]     PAST MEDICAL HISTORY Past Medical History:  Diagnosis Date   Arthritis    GERD (gastroesophageal reflux disease)    HOH (hard of hearing)    Hyperlipidemia    Hypertension  Hypothyroid    IDDM (insulin dependent diabetes mellitus)    Leg swelling    Macular degeneration    Shortness of breath dyspnea    with exertion   Sleep apnea    does not wear CPAP   Varicose veins    Wears glasses    Past Surgical History:  Procedure Laterality Date   ANTERIOR CERVICAL DECOMP/DISCECTOMY FUSION N/A 11/08/2014   Procedure: Cervical three-four,  Cervical six-seven anterior cervical decompression with fusion plating and bonegraft;  Surgeon: Ashok Pall, MD;  Location: [facility];  Service: Neurosurgery;  Laterality: N/A;  Cervical three-four,  Cervical six-seven anterior cervical decompression with fusion plating and bonegraft   BIOPSY THYROID     benign thyroid nodule removal   CATARACT EXTRACTION,  BILATERAL     COLONOSCOPY W/ BIOPSIES AND POLYPECTOMY     ENDOMETRIAL BIOPSY  2000   SPINE SURGERY Left  11/08/2014   Left ACDF    TUBAL LIGATION     VARICOSE VEIN SURGERY      FAMILY HISTORY Family History  Problem Relation Age of Onset   Cancer Mother        colon   Cancer Father        GI   Breast cancer Neg Hx     SOCIAL HISTORY Social History   Tobacco Use   Smoking status: Never   Smokeless tobacco: Never  Substance Use Topics   Alcohol use: No   Drug use: No         OPHTHALMIC EXAM:  Base Eye Exam     Visual Acuity (ETDRS)       Right Left   Dist cc HM 20/70 +1    Correction: Glasses         Tonometry (Tonopen, 1:30 PM)       Right Left   Pressure 15 15         Pupils       Pupils APD   Right PERRL Trace   Left PERRL None         Extraocular Movement       Right Left    Full, Ortho Full, Ortho         Neuro/Psych     Oriented x3: Yes   Mood/Affect: Normal           Slit Lamp and Fundus Exam     External Exam       Right Left   External Normal Normal         Slit Lamp Exam       Right Left   Lids/Lashes Normal Normal   Conjunctiva/Sclera White and quiet White and quiet   Cornea Clear Clear   Anterior Chamber Deep and quiet Deep and quiet   Iris Round and reactive Round and reactive   Lens Posterior chamber intraocular lens Posterior chamber intraocular lens   Anterior Vitreous Normal Normal         Fundus Exam       Right Left   Posterior Vitreous Posterior vitreous detachment Posterior vitreous detachment   Disc peripapillary hemorrhage Normal   C/D Ratio 0.4 0.3   Macula Subfoveal geographic atrophy with large subfoveal disciform scar Retinal pigment epithelial atrophy, Geographic atrophy, Advanced age related macular degeneration, Microaneurysms, Subretinal neovascular membrane, Subretinal hemorrhage smaller, still active, inferior to the fovea and along the inferotemporal arcade but less  hemorrhage   Vessels Normal Normal   Periphery Normal Normal  IMAGING AND PROCEDURES  Imaging and Procedures for 03/21/22  OCT, Retina - OU - Both Eyes       Right Eye Quality was good. Scan locations included subfoveal. Progression has been stable. Findings include no IRF, no SRF, abnormal foveal contour, disciform scar, subretinal scarring.   Left Eye Quality was borderline. Scan locations included subfoveal. Progression has improved. Findings include intraretinal hyper-reflective material.   Notes OD with chronic disciform scar, with intraretinal fluid in the perifoveal region as well as subretinal fluid on the disciform scar inferotemporal to nerve  OS vastly improved over the last 3 months on therapy.  We will repeat injection today OS at  3 month follow-up interval and extend interval next in order to minimize the risk of geographic atrophy spreading      Intravitreal Injection, Pharmacologic Agent - OS - Left Eye       Time Out 03/21/2022. 2:28 PM. Confirmed correct patient, procedure, site, and patient consented.   Anesthesia Topical anesthesia was used. Anesthetic medications included Lidocaine 4%.   Procedure Preparation included 5% betadine to ocular surface, 10% betadine to eyelids, Tobramycin 0.3%, Ofloxacin . A 30 gauge needle was used.   Injection: 2.5 mg bevacizumab 2.5 MG/0.1ML   Route: Intravitreal, Site: Left Eye   NDC: [phone], Lot: [id number], Expiration date: 05/15/2022   Post-op Post injection exam found visual acuity of at least counting fingers. The patient tolerated the procedure well. There were no complications. The patient received written and verbal post procedure care education. Post injection medications included ocuflox.              ASSESSMENT/PLAN:  Advanced nonexudative age-related macular degeneration of both eyes with subfoveal involvement Subfoveal geographic atrophy in the past  Exudative age-related  macular degeneration of right eye with active choroidal neovascularization ([place]) OD today is 5 months since last treatment.  Exudative age-related macular degeneration of left eye with active choroidal neovascularization (HCC) OS, last treatment and evaluation 3 months previous.     ICD-10-CM   1. Exudative age-related macular degeneration of left eye with active choroidal neovascularization (HCC)  H35.3221 OCT, Retina - OU - Both Eyes    Intravitreal Injection, Pharmacologic Agent - OS - Left Eye    bevacizumab (AVASTIN) SOSY 2.5 mg    2. Advanced nonexudative age-related macular degeneration of both eyes with subfoveal involvement  H35.3134 OCT, Retina - OU - Both Eyes    3. Exudative age-related macular degeneration of right eye with active choroidal neovascularization (HCC)  H35.3211 OCT, Retina - OU - Both Eyes      1.  OD today disciform scar chronic active in the past not active now stable overall also geographic atrophy OD  2.  OS with extensive geographic atrophy encroaching upon FAZ.  Some impact on acuity yet still with intraretinal fluid temporally on the fovea in the macular region.  Repeat injection today at 3 months to maintain  3.  Evaluate OS in  9 weeks in order to confirm stability  Ophthalmic Meds Ordered this visit:  Meds ordered this encounter  Medications   bevacizumab (AVASTIN) SOSY 2.5 mg       Return in about 9 weeks (around 05/23/2022) for dilate, OS, AVASTIN OCT.  There are no Patient Instructions on file for this visit.   Explained the diagnoses, plan, and follow up with the patient and they expressed understanding.  Patient expressed understanding of the importance of proper follow up care.   Clent Demark Tyreck Bell M.D. Diseases &  Surgery of the Retina and Vitreous [facility] 03/21/22     Abbreviations: M myopia (nearsighted); A astigmatism; H hyperopia (farsighted); P presbyopia; Mrx spectacle prescription;  CTL contact lenses; OD  right eye; OS left eye; OU both eyes  XT exotropia; ET esotropia; PEK punctate epithelial keratitis; PEE punctate epithelial erosions; DES dry eye syndrome; MGD meibomian gland dysfunction; ATs artificial tears; PFAT's preservative free artificial tears; [place] nuclear sclerotic cataract; PSC posterior subcapsular cataract; ERM epi-retinal membrane; PVD posterior vitreous detachment; RD retinal detachment; DM diabetes mellitus; DR diabetic retinopathy; NPDR non-proliferative diabetic retinopathy; PDR proliferative diabetic retinopathy; CSME clinically significant macular edema; DME diabetic macular edema; dbh dot blot hemorrhages; CWS cotton wool spot; POAG primary open angle glaucoma; C/D cup-to-disc ratio; HVF humphrey visual field; GVF goldmann visual field; OCT optical coherence tomography; IOP intraocular pressure; BRVO Branch retinal vein occlusion; CRVO central retinal vein occlusion; CRAO central retinal artery occlusion; BRAO branch retinal artery occlusion; RT retinal tear; SB scleral buckle; PPV pars plana vitrectomy; VH Vitreous hemorrhage; PRP panretinal laser photocoagulation; IVK intravitreal kenalog; VMT vitreomacular traction; MH Macular hole;  NVD neovascularization of the disc; NVE neovascularization elsewhere; AREDS age related eye disease study; ARMD age related macular degeneration; POAG primary open angle glaucoma; EBMD epithelial/anterior basement membrane dystrophy; ACIOL anterior chamber intraocular lens; IOL intraocular lens; PCIOL posterior chamber intraocular lens; Phaco/IOL phacoemulsification with intraocular lens placement; [place] photorefractive keratectomy; LASIK laser assisted in situ keratomileusis; HTN hypertension; DM diabetes mellitus; COPD chronic obstructive pulmonary disease

## 2022-04-17 ENCOUNTER — Other Ambulatory Visit: Payer: Self-pay | Admitting: Internal Medicine

## 2022-04-17 DIAGNOSIS — E1169 Type 2 diabetes mellitus with other specified complication: Secondary | ICD-10-CM

## 2022-04-17 DIAGNOSIS — Z79899 Other long term (current) drug therapy: Secondary | ICD-10-CM

## 2022-05-22 ENCOUNTER — Encounter: Payer: Self-pay | Admitting: Internal Medicine

## 2022-05-22 ENCOUNTER — Ambulatory Visit (INDEPENDENT_AMBULATORY_CARE_PROVIDER_SITE_OTHER): Payer: Medicare Other | Admitting: Internal Medicine

## 2022-05-22 VITALS — BP 118/70 | HR 69 | Temp 97.8°F | Resp 17 | Ht 62.0 in | Wt 173.0 lb

## 2022-05-22 DIAGNOSIS — I10 Essential (primary) hypertension: Secondary | ICD-10-CM

## 2022-05-22 DIAGNOSIS — E1169 Type 2 diabetes mellitus with other specified complication: Secondary | ICD-10-CM

## 2022-05-22 DIAGNOSIS — E1165 Type 2 diabetes mellitus with hyperglycemia: Secondary | ICD-10-CM

## 2022-05-22 DIAGNOSIS — Z79899 Other long term (current) drug therapy: Secondary | ICD-10-CM | POA: Diagnosis not present

## 2022-05-22 DIAGNOSIS — Z794 Long term (current) use of insulin: Secondary | ICD-10-CM | POA: Diagnosis not present

## 2022-05-22 DIAGNOSIS — N1832 Chronic kidney disease, stage 3b: Secondary | ICD-10-CM

## 2022-05-22 DIAGNOSIS — E559 Vitamin D deficiency, unspecified: Secondary | ICD-10-CM | POA: Diagnosis not present

## 2022-05-22 DIAGNOSIS — E1122 Type 2 diabetes mellitus with diabetic chronic kidney disease: Secondary | ICD-10-CM

## 2022-05-22 DIAGNOSIS — E039 Hypothyroidism, unspecified: Secondary | ICD-10-CM

## 2022-05-22 DIAGNOSIS — E785 Hyperlipidemia, unspecified: Secondary | ICD-10-CM | POA: Diagnosis not present

## 2022-05-22 NOTE — Progress Notes (Signed)
Future Appointments  Date Time Provider Department  05/22/2022                  3 mo ov  3:30 PM [name], MD [facility]  05/23/2022  2:00 PM Rankin, Clent Demark, MD [facility]  08/27/2022                 cpe  3:00 PM [name], MD [facility]  12/11/2022                    wellness  3:00 PM Darrol Jump, NP [facility]    History of Present Illness:       This very nice 88 y.o. WWF presents for 3 month follow up with HTN, HLD, Insulin requiring T2 _DM, Hypothyroidism, Vascular Dementia   and Vitamin D Deficiency. Patient resides with  Konrad Dolores - her caregiver son  who supervises her meds and administers /regulates her Insulin.        Patient is treated for HTN (1994)  & BP has been controlled at home. Today's BP is at goal - 118/70 . Patient has had no complaints of any cardiac type chest pain, palpitations, dyspnea/orthopnea/PND, dizziness, claudication, or dependent edema.       Hyperlipidemia is controlled with diet & meds. Patient denies myalgias or other med SE's. Last Lipids were at goal except elevated Trig's :  Lab Results  Component Value Date   CHOL 136 12/06/2021   HDL 48 (L) 12/06/2021   LDLCALC 59 12/06/2021   TRIG 217 (H) 12/06/2021   CHOLHDL 2.8 12/06/2021     Also, the patient has history of   Insulin Requiring T2_DM (2003) with CKD3b  (GFR 34).  Patient's son Konrad Dolores monitors her CBG's 2 x /day and covers her with SS Novolin 70/30.   She has had no symptoms of reactive hypoglycemia, diabetic polys, paresthesias or visual blurring.  Last A1c was not at goal:   Lab Results  Component Value Date   HGBA1C 9.6 (H) 12/06/2021                                               Patient has  been on Thyroid Replacement since she had Goiter  surgery in the 1970's.        Further, the patient also has history of Vitamin D Deficiency and supplements vitamin D without any suspected side-effects. Last vitamin D was at goal :  Lab Results  Component Value Date   VD25OH  54 08/20/2021       Current Outpatient Medications:    ACCU-CHEK SOFTCLIX LANCETS lancets, CHECK BLOOD SUGER UP TO 3 TIMES A DAY, Disp: 100 each, Rfl: 0   aspirin (SB LOW DOSE ASA EC) 81 MG EC tablet, Take      1 tablet        Daily, Disp: 1 tablet, Rfl: 0   CALCIUM-MAGNESIUM-ZINC PO, Take 1 tablet by mouth daily., Disp: , Rfl:    Cholecalciferol (VITAMIN D3) 125 MCG (5000 UT) CAPS, Take      1 capsule     2 x /day, Disp: , Rfl:    diclofenac Sodium (VOLTAREN) 1 % GEL, Apply to affected area up to 4 times daily for pain., Disp: 4 g, Rfl: 2   Dulaglutide (TRULICITY) 1.5 TG/2.5WL SOPN, Inject 1.5 mg into the skin once a  week., Disp: 6 mL, Rfl: 3   DULoxetine (CYMBALTA) 30 MG capsule, TAKE 1 CAPSULE ONCE DAILY FOR MOOD, Disp: 90 capsule, Rfl: 3   famotidine (PEPCID) 20 MG tablet, Take 20 mg by mouth daily., Disp: , Rfl:    furosemide (LASIX) 40 MG tablet, TAKE ONE TABLET TWICE DAILY FOR FLUID RETENTION/ANKLE SWELLING, Disp: 180 tablet, Rfl: 3   insulin glargine (LANTUS SOLOSTAR) 100 UNIT/ML Solostar Pen, Inject 30 units into skin once daily in the morning for diabetes., Disp: 15 mL, Rfl: 11   Insulin Pen Needle 32G X 4 MM MISC, Use to inject daily insulin as directed., Disp: 100 each, Rfl: 3   levothyroxine (SYNTHROID) 100 MCG tablet, TAKE (1) TABLET DAILY BE- FORE BREAKFAST., Disp: 90 tablet, Rfl: 3   losartan (COZAAR) 100 MG tablet, Take  1 tablet  Daily  for BP, Disp: 90 tablet, Rfl: 3   Magnesium 400 MG CAPS, Take 1 capsule twice Daily with meals, Disp: , Rfl:    metFORMIN (GLUCOPHAGE) 1000 MG tablet, TAKE (1) TABLET TWICE A DAY WITH MEALS (BREAKFAST AND SUPPER), Disp: 180 tablet, Rfl: 3   Multiple Vitamins-Minerals (ICAPS) CAPS, Takle     1 tablet     Daily for Eyes, Disp: , Rfl:    Polyethylene Glycol 3350 (MIRALAX PO), Take 1 Scoop by mouth daily., Disp: , Rfl:    rosuvastatin (CRESTOR) 5 MG tablet, TAKE ONE TABLET ONCE DAILY FOR CHOLESTEROL, Disp: 90 tablet, Rfl: 3   vitamin B-12  (CYANOCOBALAMIN) 1000 MCG tablet, Take 1,000 mcg by mouth daily., Disp: , Rfl:     Allergies  Allergen Reactions   Detrol [Tolterodine]     PMHx:   Past Medical History:  Diagnosis Date   Arthritis    GERD (gastroesophageal reflux disease)    HOH (hard of hearing)    Hyperlipidemia    Hypertension    Hypothyroid    IDDM (insulin dependent diabetes mellitus)    Leg swelling    Macular degeneration    Shortness of breath dyspnea    with exertion   Sleep apnea    does not wear CPAP   Varicose veins    Wears glasses      Immunization History  Administered Date(s) Administered   DT  10/20/2014   Influenza Split 06/04/2017   Influenza, High Dose  07/01/2016, 05/29/2018   Influenza,inj,quad 05/11/2017   Influenza 06/09/2019   Pneumococcal -13 04/05/2014   Pneumococcal-23 09/09/2004   Td 09/10/2003   Zoster, Live 09/09/2005     Past Surgical History:  Procedure Laterality Date   ANTERIOR CERVICAL DECOMP/DISCECTOMY FUSION N/A 11/08/2014   Procedure: Cervical three-four,  Cervical six-seven anterior cervical decompression with fusion plating and bonegraft;  Surgeon: Ashok Pall, MD;  Location: [facility];  Service: Neurosurgery;  Laterality: N/A;  Cervical three-four,  Cervical six-seven anterior cervical decompression with fusion plating and bonegraft   BIOPSY THYROID     benign thyroid nodule removal   CATARACT EXTRACTION, BILATERAL     COLONOSCOPY W/ BIOPSIES AND POLYPECTOMY     ENDOMETRIAL BIOPSY  2000   SPINE SURGERY Left  11/08/2014   Left ACDF    TUBAL LIGATION     VARICOSE VEIN SURGERY      FHx:    Reviewed / unchanged  SHx:    Reviewed / unchanged   Systems Review:  Constitutional: Denies fever, chills, wt changes, headaches, insomnia, fatigue, night sweats, change in appetite. Eyes: Denies redness, blurred vision, diplopia, discharge, itchy, watery eyes.  ENT: Denies discharge, congestion, post nasal drip, epistaxis, sore throat, earache, hearing  loss, dental pain, tinnitus, vertigo, sinus pain, snoring.  CV: Denies chest pain, palpitations, irregular heartbeat, syncope, dyspnea, diaphoresis, orthopnea, PND, claudication or edema. Respiratory: denies cough, dyspnea, DOE, pleurisy, hoarseness, laryngitis, wheezing.  Gastrointestinal: Denies dysphagia, odynophagia, heartburn, reflux, water brash, abdominal pain or cramps, nausea, vomiting, bloating, diarrhea, constipation, hematemesis, melena, hematochezia  or hemorrhoids. Genitourinary: Denies dysuria, frequency, urgency, nocturia, hesitancy, discharge, hematuria or flank pain. Musculoskeletal: Denies arthralgias, myalgias, stiffness, jt. swelling, pain, limping or strain/sprain.  Skin: Denies pruritus, rash, hives, warts, acne, eczema or change in skin lesion(s). Neuro: No weakness, tremor, incoordination, spasms, paresthesia or pain. Psychiatric: Denies confusion, memory loss or sensory loss. Endo: Denies change in weight, skin or hair change.  Heme/Lymph: No excessive bleeding, bruising or enlarged lymph nodes.  Physical Exam  BP 118/70   Pulse 69   Temp 97.8 F (36.6 C)   Resp 17   Ht '5\' 2"'$  (1.575 m)   Wt 173 lb (78.5 kg)   SpO2 96%   BMI 31.64 kg/m   Appears  over  nourished  and in no distress.  Eyes: PERRLA, EOMs, conjunctiva no swelling or erythema. Sinuses: No frontal/maxillary tenderness ENT/Mouth: EAC's clear, TM's nl w/o erythema, bulging. Nares clear w/o erythema, swelling, exudates. Oropharynx clear without erythema or exudates. Oral hygiene is good. Tongue normal, non obstructing. Hearing intact.  Neck: Supple. Thyroid not palpable. Car 2+/2+ without bruits, nodes or JVD. Chest: Respirations nl with BS clear & equal w/o rales, rhonchi, wheezing or stridor.  Cor: Heart sounds normal w/ regular rate and rhythm without sig. murmurs, gallops, clicks or rubs. Peripheral pulses normal and equal  without edema.  Abdomen: Soft & bowel sounds normal. Non-tender w/o  guarding, rebound, hernias, masses or organomegaly.  Lymphatics: Unremarkable.  Musculoskeletal: Full ROM all peripheral extremities, joint stability, 5/5 strength and normal gait.  Skin: Warm, dry without exposed rashes, lesions or ecchymosis apparent.  Neuro: Cranial nerves intact, reflexes equal bilaterally. Sensory-motor testing grossly intact. Tendon reflexes grossly intact.  Pysch: Alert & oriented x 3.  Insight and judgement nl & appropriate. No ideations.  Assessment and Plan:  1. Essential hypertension  - Continue medication, monitor blood pressure at home.  - Continue DASH diet.  Reminder to go to the ER if any CP,  SOB, nausea, dizziness, severe HA, changes vision/speech.   - CBC with Differential/Platelet - COMPLETE METABOLIC PANEL WITH GFR - Magnesium - TSH  2. Hyperlipidemia associated with type 2 diabetes mellitus ([place])  - Continue diet/meds, exercise,& lifestyle modifications.  - Continue monitor periodic cholesterol/liver & renal functions    - Lipid panel - TSH  3. Type 2 diabetes mellitus with stage 3b chronic kidney  disease, with long-term current use of insulin ([facility])  - Continue diet, exercise  - Lifestyle modifications.  - Monitor appropriate labs   - Hemoglobin A1c  4. Poorly controlled type 2 diabetes mellitus ([facility])  - Hemoglobin A1c  5. Hypothyroidism  - TSH  6. Vitamin D deficiency  - Continue supplementation.   - VITAMIN D 25 Hydroxy  7. Medication management  - CBC with Differential/Platelet - COMPLETE METABOLIC PANEL WITH GFR - Magnesium - Lipid panel - TSH - Hemoglobin A1c - VITAMIN D 25 Hydroxy            Discussed  regular exercise, BP monitoring, weight control to achieve/maintain BMI less than 25 and discussed med and SE's. Recommended labs to assess and monitor  clinical status with further disposition pending results of labs.  I discussed the assessment and treatment plan with the patient. The patient was provided an  opportunity to ask questions and all were answered. The patient agreed with the plan and demonstrated an understanding of the instructions.  I provided over 30 minutes of exam, counseling, chart review and  complex critical decision making.        The patient was advised to call back or seek an in-person evaluation if the symptoms worsen or if the condition fails to improve as anticipated.   Kirtland Bouchard, MD

## 2022-05-22 NOTE — Patient Instructions (Signed)

## 2022-05-23 ENCOUNTER — Ambulatory Visit (INDEPENDENT_AMBULATORY_CARE_PROVIDER_SITE_OTHER): Payer: Medicare Other | Admitting: Ophthalmology

## 2022-05-23 ENCOUNTER — Encounter (INDEPENDENT_AMBULATORY_CARE_PROVIDER_SITE_OTHER): Payer: Self-pay | Admitting: Ophthalmology

## 2022-05-23 DIAGNOSIS — H353211 Exudative age-related macular degeneration, right eye, with active choroidal neovascularization: Secondary | ICD-10-CM | POA: Diagnosis not present

## 2022-05-23 DIAGNOSIS — H353221 Exudative age-related macular degeneration, left eye, with active choroidal neovascularization: Secondary | ICD-10-CM

## 2022-05-23 DIAGNOSIS — H353134 Nonexudative age-related macular degeneration, bilateral, advanced atrophic with subfoveal involvement: Secondary | ICD-10-CM | POA: Diagnosis not present

## 2022-05-23 LAB — CBC WITH DIFFERENTIAL/PLATELET
Absolute Monocytes: 1064 cells/uL — ABNORMAL HIGH (ref 200–950)
Basophils Absolute: 53 cells/uL (ref 0–200)
Basophils Relative: 0.4 %
Eosinophils Absolute: 386 cells/uL (ref 15–500)
Eosinophils Relative: 2.9 %
HCT: 40 % (ref 35.0–45.0)
Hemoglobin: 13 g/dL (ref 11.7–15.5)
Lymphs Abs: 2554 cells/uL (ref 850–3900)
MCH: 28.8 pg (ref 27.0–33.0)
MCHC: 32.5 g/dL (ref 32.0–36.0)
MCV: 88.5 fL (ref 80.0–100.0)
MPV: 11.2 fL (ref 7.5–12.5)
Monocytes Relative: 8 %
Neutro Abs: 9244 cells/uL — ABNORMAL HIGH (ref 1500–7800)
Neutrophils Relative %: 69.5 %
Platelets: 414 10*3/uL — ABNORMAL HIGH (ref 140–400)
RBC: 4.52 10*6/uL (ref 3.80–5.10)
RDW: 12.5 % (ref 11.0–15.0)
Total Lymphocyte: 19.2 %
WBC: 13.3 10*3/uL — ABNORMAL HIGH (ref 3.8–10.8)

## 2022-05-23 LAB — COMPLETE METABOLIC PANEL WITH GFR
AG Ratio: 1.4 (calc) (ref 1.0–2.5)
ALT: 11 U/L (ref 6–29)
AST: 17 U/L (ref 10–35)
Albumin: 3.9 g/dL (ref 3.6–5.1)
Alkaline phosphatase (APISO): 52 U/L (ref 37–153)
BUN/Creatinine Ratio: 20 (calc) (ref 6–22)
BUN: 21 mg/dL (ref 7–25)
CO2: 30 mmol/L (ref 20–32)
Calcium: 9.9 mg/dL (ref 8.6–10.4)
Chloride: 92 mmol/L — ABNORMAL LOW (ref 98–110)
Creat: 1.07 mg/dL — ABNORMAL HIGH (ref 0.60–0.95)
Globulin: 2.8 g/dL (calc) (ref 1.9–3.7)
Glucose, Bld: 244 mg/dL — ABNORMAL HIGH (ref 65–99)
Potassium: 4.4 mmol/L (ref 3.5–5.3)
Sodium: 134 mmol/L — ABNORMAL LOW (ref 135–146)
Total Bilirubin: 0.4 mg/dL (ref 0.2–1.2)
Total Protein: 6.7 g/dL (ref 6.1–8.1)
eGFR: 50 mL/min/{1.73_m2} — ABNORMAL LOW (ref >=60)

## 2022-05-23 LAB — LIPID PANEL
Cholesterol: 140 mg/dL (ref <200)
HDL: 56 mg/dL (ref >=50)
LDL Cholesterol (Calc): 59 mg/dL (calc)
Non-HDL Cholesterol (Calc): 84 mg/dL (calc) (ref <130)
Total CHOL/HDL Ratio: 2.5 (calc) (ref <5.0)
Triglycerides: 175 mg/dL — ABNORMAL HIGH (ref <150)

## 2022-05-23 LAB — HEMOGLOBIN A1C
Hgb A1c MFr Bld: 7.3 % of total Hgb — ABNORMAL HIGH (ref <5.7)
Mean Plasma Glucose: 163 mg/dL
eAG (mmol/L): 9 mmol/L

## 2022-05-23 LAB — VITAMIN D 25 HYDROXY (VIT D DEFICIENCY, FRACTURES): Vit D, 25-Hydroxy: 59 ng/mL (ref 30–100)

## 2022-05-23 LAB — MAGNESIUM: Magnesium: 1.4 mg/dL — ABNORMAL LOW (ref 1.5–2.5)

## 2022-05-23 LAB — TSH: TSH: 1.16 mIU/L (ref 0.40–4.50)

## 2022-05-23 MED ORDER — BEVACIZUMAB CHEMO INJECTION 1.25MG/0.05ML SYRINGE FOR KALEIDOSCOPE
2.5000 mg | INTRAVITREAL | Status: AC | PRN
  Administered 2022-05-23: 2.5 mg via INTRAVITREAL

## 2022-05-23 NOTE — Assessment & Plan Note (Signed)
The nature of age--related macular degeneration was discussed with the patient as well as the distinction between dry and wet types. Checking an Amsler Grid daily with advice to return immediately should a distortion develop, was given to the patient. The patient 's smoking status now and in the past was determined and advice based on the AREDS study was provided regarding the consumption of antioxidant supplements. AREDS 2 vitamin formulation was recommended. Consumption of dark leafy vegetables and fresh fruits of various colors was recommended. Treatment modalities for wet macular degeneration particularly the use of intravitreal injections of anti-blood vessel growth factors was discussed with the patient. Avastin, Lucentis, and Eylea are the available options. On occasion, therapy includes the use of photodynamic therapy and thermal laser. Stressed to the patient do not rub eyes.  Patient was advised to check Amsler Grid daily and return immediately if changes are noted. Instructions on using the grid were given to the patient. All patient questions were answered.  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  No sign of CNVM today

## 2022-05-23 NOTE — Progress Notes (Signed)
<><><><><><><><><><><><><><><><><><><><><><><><><><><><><><><><><> <><><><><><><><><><><><><><><><><><><><><><><><><><><><><><><><><>  -   WBC slightly elevated over the last 2 years  & Stable - Will continue to monitor closely.  <><><><><><><><><><><><><><><><><><><><><><><><><><><><><><><><><> <><><><><><><><><><><><><><><><><><><><><><><><><><><><><><><><><>  -   Magnesium  -     -  very  low- goal is betw 2.0 - 2.5,   - So..............Marland Kitchen  Recommend that you take  Magnesium 500 mg tablet daily   - also important to eat lots of  leafy green vegetables   - spinach - Kale - collards - greens - okra - asparagus  - broccoli - quinoa - squash - almonds   - black, red, white beans  -  peas - green beans <><><><><><><><><><><><><><><><><><><><><><><><><><><><><><><><><> <><><><><><><><><><><><><><><><><><><><><><><><><><><><><><><><><>  -  Kidney functions Stable <><><><><><><><><><><><><><><><><><><><><><><><><><><><><><><><><>  -  Total Cholesterol 140 - Excellent  <><><><><><><><><><><><><><><><><><><><><><><><><><><><><><><><><>  -  A1c = 7.3% - is a little high  -->>    - Avoid Sweets, Candy & White Stuff   - White Rice, White Potatoes, White Flour  - Breads &  Pasta <><><><><><><><><><><><><><><><><><><><><><><><><><><><><><><><><>  -  Vitamin D = 59 - Excellent  - Please Keep dose same  <><><><><><><><><><><><><><><><><><><><><><><><><><><><><><><><><>  - .All Else - CBC - Kidneys - Electrolytes - Liver - Magnesium & Thyroid    - all  Normal / OK <><><><><><><><><><><><><><><><><><><><><><><><><><><><><><><><><>

## 2022-05-23 NOTE — Assessment & Plan Note (Signed)
Extensive atrophy in the macular region and perifoveal.  Yet still good acuity.  We will balance the delivery of antivegF medication so as not to risk significant progression of atrophy

## 2022-05-23 NOTE — Progress Notes (Signed)
05/23/2022     CHIEF COMPLAINT Patient presents for No chief complaint on file.     HISTORY OF PRESENT ILLNESS: Catherine Wood is a 88 y.o. female who presents to the clinic today for:   HPI   Age related macular degeneration of left eye 9 weeks dilate os avastin oct Pt states her vision has been stable Pt denies any new floaters or FOL Pt complains of blurry vision  Last edited by Morene Rankins, CMA on 05/23/2022  2:32 PM.      Referring physician: [name], MD [address] [place],  [place] [address]  HISTORICAL INFORMATION:   Selected notes from the MEDICAL RECORD NUMBER    Lab Results  Component Value Date   HGBA1C 7.3 (H) 05/22/2022     CURRENT MEDICATIONS: No current outpatient medications on file. (Ophthalmic Drugs)   No current facility-administered medications for this visit. (Ophthalmic Drugs)   Current Outpatient Medications (Other)  Medication Sig   ACCU-CHEK SOFTCLIX LANCETS lancets CHECK BLOOD SUGER UP TO 3 TIMES A DAY   aspirin (SB LOW DOSE ASA EC) 81 MG EC tablet Take      1 tablet        Daily   CALCIUM-MAGNESIUM-ZINC PO Take 1 tablet by mouth daily.   Cholecalciferol (VITAMIN D3) 125 MCG (5000 UT) CAPS Take      1 capsule     2 x /day   diclofenac Sodium (VOLTAREN) 1 % GEL Apply to affected area up to 4 times daily for pain.   Dulaglutide (TRULICITY) 1.5 UE/4.5WU SOPN Inject 1.5 mg into the skin once a week.   DULoxetine (CYMBALTA) 30 MG capsule TAKE 1 CAPSULE ONCE DAILY FOR MOOD   famotidine (PEPCID) 20 MG tablet Take 20 mg by mouth daily.   furosemide (LASIX) 40 MG tablet TAKE ONE TABLET TWICE DAILY FOR FLUID RETENTION/ANKLE SWELLING   insulin glargine (LANTUS SOLOSTAR) 100 UNIT/ML Solostar Pen Inject 30 units into skin once daily in the morning for diabetes.   Insulin Pen Needle 32G X 4 MM MISC Use to inject daily insulin as directed.   levothyroxine (SYNTHROID) 100 MCG tablet TAKE (1) TABLET DAILY BE- FORE  BREAKFAST.   losartan (COZAAR) 100 MG tablet Take  1 tablet  Daily  for BP   Magnesium 400 MG CAPS Take 1 capsule twice Daily with meals   metFORMIN (GLUCOPHAGE) 1000 MG tablet TAKE (1) TABLET TWICE A DAY WITH MEALS (BREAKFAST AND SUPPER)   Multiple Vitamins-Minerals (ICAPS) CAPS Takle     1 tablet     Daily for Eyes   Polyethylene Glycol 3350 (MIRALAX PO) Take 1 Scoop by mouth daily.   rosuvastatin (CRESTOR) 5 MG tablet TAKE ONE TABLET ONCE DAILY FOR CHOLESTEROL   vitamin B-12 (CYANOCOBALAMIN) 1000 MCG tablet Take 1,000 mcg by mouth daily.   No current facility-administered medications for this visit. (Other)      REVIEW OF SYSTEMS: ROS   Negative for: Constitutional, Gastrointestinal, Neurological, Skin, Genitourinary, Musculoskeletal, HENT, Endocrine, Cardiovascular, Eyes, Respiratory, Psychiatric, Allergic/Imm, Heme/Lymph Last edited by Morene Rankins, CMA on 05/23/2022  2:32 PM.       ALLERGIES Allergies  Allergen Reactions   Detrol [Tolterodine]     PAST MEDICAL HISTORY Past Medical History:  Diagnosis Date   Arthritis    GERD (gastroesophageal reflux disease)    HOH (hard of hearing)    Hyperlipidemia    Hypertension    Hypothyroid    IDDM (insulin dependent  diabetes mellitus)    Leg swelling    Macular degeneration    Shortness of breath dyspnea    with exertion   Sleep apnea    does not wear CPAP   Varicose veins    Wears glasses    Past Surgical History:  Procedure Laterality Date   ANTERIOR CERVICAL DECOMP/DISCECTOMY FUSION N/A 11/08/2014   Procedure: Cervical three-four,  Cervical six-seven anterior cervical decompression with fusion plating and bonegraft;  Surgeon: Ashok Pall, MD;  Location: [facility];  Service: Neurosurgery;  Laterality: N/A;  Cervical three-four,  Cervical six-seven anterior cervical decompression with fusion plating and bonegraft   BIOPSY THYROID     benign thyroid nodule removal   CATARACT EXTRACTION, BILATERAL      COLONOSCOPY W/ BIOPSIES AND POLYPECTOMY     ENDOMETRIAL BIOPSY  2000   SPINE SURGERY Left  11/08/2014   Left ACDF    TUBAL LIGATION     VARICOSE VEIN SURGERY      FAMILY HISTORY Family History  Problem Relation Age of Onset   Cancer Mother        colon   Cancer Father        GI   Breast cancer Neg Hx     SOCIAL HISTORY Social History   Tobacco Use   Smoking status: Never   Smokeless tobacco: Never  Substance Use Topics   Alcohol use: No   Drug use: No         OPHTHALMIC EXAM:  Base Eye Exam     Visual Acuity (ETDRS)       Right Left   Dist cc HM 20/60 +3    Correction: Glasses         Tonometry (Tonopen, 2:36 PM)       Right Left   Pressure 6 8         Extraocular Movement       Right Left    Ortho Ortho    -- -- --  --  --  -- -- --   -- -- --  --  --  -- -- --           Neuro/Psych     Oriented x3: Yes   Mood/Affect: Normal         Dilation     Left eye: 2.5% Phenylephrine, 1.0% Mydriacyl @ 2:33 PM           Slit Lamp and Fundus Exam     External Exam       Right Left   External Normal Normal         Slit Lamp Exam       Right Left   Lids/Lashes Normal Normal   Conjunctiva/Sclera White and quiet White and quiet   Cornea Clear Clear   Anterior Chamber Deep and quiet Deep and quiet   Iris Round and reactive Round and reactive   Lens Posterior chamber intraocular lens Posterior chamber intraocular lens   Anterior Vitreous Normal Normal         Fundus Exam       Right Left   Posterior Vitreous Posterior vitreous detachment Posterior vitreous detachment   Disc peripapillary hemorrhage Normal   C/D Ratio 0.4 0.3   Macula Subfoveal geographic atrophy with large subfoveal disciform scar Retinal pigment epithelial atrophy, Geographic atrophy, Advanced age related macular degeneration, Microaneurysms, Subretinal neovascular membrane, Subretinal hemorrhage smaller, still active, inferior to the fovea and along  the inferotemporal arcade but less hemorrhage  Vessels Normal Normal   Periphery Normal Normal            IMAGING AND PROCEDURES  Imaging and Procedures for 05/23/22  OCT, Retina - OU - Both Eyes       Right Eye Quality was borderline. Scan locations included subfoveal. Central Foveal Thickness: 230. Progression has been stable. Findings include no IRF, no SRF, abnormal foveal contour, disciform scar, subretinal scarring.   Left Eye Quality was borderline. Scan locations included subfoveal. Central Foveal Thickness: 327. Progression has improved. Findings include intraretinal hyper-reflective material.   Notes OD with chronic disciform scar, with intraretinal fluid in the perifoveal region as well as subretinal fluid on the disciform scar inferotemporal to nerve  OS vastly improved over the last 3 months on therapy.  We will repeat injection today OS at  follow-up at 9 week interval and extend interval next in order to minimize the risk of geographic atrophy spreading      Intravitreal Injection, Pharmacologic Agent - OS - Left Eye       Time Out 05/23/2022. 3:11 PM. Confirmed correct patient, procedure, site, and patient consented.   Anesthesia Topical anesthesia was used. Anesthetic medications included Lidocaine 4%.   Procedure Preparation included 5% betadine to ocular surface, 10% betadine to eyelids, Tobramycin 0.3%, Ofloxacin . A 30 gauge needle was used.   Injection: 2.5 mg Bevacizumab 1.'25mg'$ /0.84m   Route: Intravitreal, Site: Left Eye   NDC: [id number] Lot: [id number]  Post-op Post injection exam found visual acuity of at least counting fingers. The patient tolerated the procedure well. There were no complications. The patient received written and verbal post procedure care education. Post injection medications included ocuflox.              ASSESSMENT/PLAN:  Exudative age-related macular degeneration of right eye with active choroidal  neovascularization (HCC) The nature of age--related macular degeneration was discussed with the patient as well as the distinction between dry and wet types. Checking an Amsler Grid daily with advice to return immediately should a distortion develop, was given to the patient. The patient 's smoking status now and in the past was determined and advice based on the AREDS study was provided regarding the consumption of antioxidant supplements. AREDS 2 vitamin formulation was recommended. Consumption of dark leafy vegetables and fresh fruits of various colors was recommended. Treatment modalities for wet macular degeneration particularly the use of intravitreal injections of anti-blood vessel growth factors was discussed with the patient. Avastin, Lucentis, and Eylea are the available options. On occasion, therapy includes the use of photodynamic therapy and thermal laser. Stressed to the patient do not rub eyes.  Patient was advised to check Amsler Grid daily and return immediately if changes are noted. Instructions on using the grid were given to the patient. All patient questions were answered.  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  No sign of CNVM today  Exudative age-related macular degeneration of left eye with active choroidal neovascularization (HCC) No sign of CNVM today stable at 9-week interval follow-up  Advanced nonexudative age-related macular degeneration of both eyes with subfoveal involvement Extensive atrophy in the macular region and perifoveal.  Yet still good acuity.  We will balance the delivery of antivegF medication so as not to risk significant progression of atrophy     ICD-10-CM   1. Exudative age-related macular degeneration of left eye with active choroidal neovascularization (HCC)  H35.3221 Intravitreal Injection, Pharmacologic Agent - OS - Left Eye    Bevacizumab (AVASTIN)  SOLN 2.5 mg    2. Advanced nonexudative age-related macular degeneration of both eyes  with subfoveal involvement  H35.3134 OCT, Retina - OU - Both Eyes    3. Exudative age-related macular degeneration of right eye with active choroidal neovascularization ([place])  H35.3211       1.  2.  3.  Ophthalmic Meds Ordered this visit:  Meds ordered this encounter  Medications   Bevacizumab (AVASTIN) SOLN 2.5 mg       Return in about 10 weeks (around 08/01/2022) for DILATE OU, AVASTIN OCT, OS.  There are no Patient Instructions on file for this visit.   Explained the diagnoses, plan, and follow up with the patient and they expressed understanding.  Patient expressed understanding of the importance of proper follow up care.   Clent Demark Aleyah Balik M.D. Diseases & Surgery of the Retina and Vitreous [facility] [place] 05/23/22     Abbreviations: M myopia (nearsighted); A astigmatism; H hyperopia (farsighted); P presbyopia; Mrx spectacle prescription;  CTL contact lenses; OD right eye; OS left eye; OU both eyes  XT exotropia; ET esotropia; PEK punctate epithelial keratitis; PEE punctate epithelial erosions; DES dry eye syndrome; MGD meibomian gland dysfunction; ATs artificial tears; PFAT's preservative free artificial tears; [place] nuclear sclerotic cataract; PSC posterior subcapsular cataract; ERM epi-retinal membrane; PVD posterior vitreous detachment; RD retinal detachment; DM diabetes mellitus; DR diabetic retinopathy; NPDR non-proliferative diabetic retinopathy; PDR proliferative diabetic retinopathy; CSME clinically significant macular edema; DME diabetic macular edema; dbh dot blot hemorrhages; CWS cotton wool spot; POAG primary open angle glaucoma; C/D cup-to-disc ratio; HVF humphrey visual field; GVF goldmann visual field; OCT optical coherence tomography; IOP intraocular pressure; BRVO Branch retinal vein occlusion; CRVO central retinal vein occlusion; CRAO central retinal artery occlusion; BRAO branch retinal artery occlusion; RT retinal tear; SB scleral buckle; PPV pars  plana vitrectomy; VH Vitreous hemorrhage; PRP panretinal laser photocoagulation; IVK intravitreal kenalog; VMT vitreomacular traction; MH Macular hole;  NVD neovascularization of the disc; NVE neovascularization elsewhere; AREDS age related eye disease study; ARMD age related macular degeneration; POAG primary open angle glaucoma; EBMD epithelial/anterior basement membrane dystrophy; ACIOL anterior chamber intraocular lens; IOL intraocular lens; PCIOL posterior chamber intraocular lens; Phaco/IOL phacoemulsification with intraocular lens placement; [place] photorefractive keratectomy; LASIK laser assisted in situ keratomileusis; HTN hypertension; DM diabetes mellitus; COPD chronic obstructive pulmonary disease

## 2022-05-23 NOTE — Assessment & Plan Note (Signed)
No sign of CNVM today stable at 9-week interval follow-up

## 2022-07-08 ENCOUNTER — Other Ambulatory Visit: Payer: Self-pay | Admitting: Internal Medicine

## 2022-07-24 DIAGNOSIS — L821 Other seborrheic keratosis: Secondary | ICD-10-CM | POA: Diagnosis not present

## 2022-07-24 DIAGNOSIS — B079 Viral wart, unspecified: Secondary | ICD-10-CM | POA: Diagnosis not present

## 2022-07-24 DIAGNOSIS — D485 Neoplasm of uncertain behavior of skin: Secondary | ICD-10-CM | POA: Diagnosis not present

## 2022-07-24 DIAGNOSIS — C44719 Basal cell carcinoma of skin of left lower limb, including hip: Secondary | ICD-10-CM | POA: Diagnosis not present

## 2022-08-05 ENCOUNTER — Encounter (INDEPENDENT_AMBULATORY_CARE_PROVIDER_SITE_OTHER): Payer: Medicare Other | Admitting: Ophthalmology

## 2022-08-05 DIAGNOSIS — H3562 Retinal hemorrhage, left eye: Secondary | ICD-10-CM | POA: Diagnosis not present

## 2022-08-05 DIAGNOSIS — H04123 Dry eye syndrome of bilateral lacrimal glands: Secondary | ICD-10-CM | POA: Diagnosis not present

## 2022-08-05 DIAGNOSIS — H353134 Nonexudative age-related macular degeneration, bilateral, advanced atrophic with subfoveal involvement: Secondary | ICD-10-CM | POA: Diagnosis not present

## 2022-08-05 DIAGNOSIS — E119 Type 2 diabetes mellitus without complications: Secondary | ICD-10-CM | POA: Diagnosis not present

## 2022-08-05 DIAGNOSIS — H35341 Macular cyst, hole, or pseudohole, right eye: Secondary | ICD-10-CM | POA: Diagnosis not present

## 2022-08-05 DIAGNOSIS — H353212 Exudative age-related macular degeneration, right eye, with inactive choroidal neovascularization: Secondary | ICD-10-CM | POA: Diagnosis not present

## 2022-08-05 DIAGNOSIS — H353221 Exudative age-related macular degeneration, left eye, with active choroidal neovascularization: Secondary | ICD-10-CM | POA: Diagnosis not present

## 2022-08-07 DIAGNOSIS — C44719 Basal cell carcinoma of skin of left lower limb, including hip: Secondary | ICD-10-CM | POA: Diagnosis not present

## 2022-08-15 DIAGNOSIS — H353212 Exudative age-related macular degeneration, right eye, with inactive choroidal neovascularization: Secondary | ICD-10-CM | POA: Diagnosis not present

## 2022-08-15 DIAGNOSIS — H43812 Vitreous degeneration, left eye: Secondary | ICD-10-CM | POA: Diagnosis not present

## 2022-08-15 DIAGNOSIS — H04123 Dry eye syndrome of bilateral lacrimal glands: Secondary | ICD-10-CM | POA: Diagnosis not present

## 2022-08-15 DIAGNOSIS — H353221 Exudative age-related macular degeneration, left eye, with active choroidal neovascularization: Secondary | ICD-10-CM | POA: Diagnosis not present

## 2022-08-22 DIAGNOSIS — Z4801 Encounter for change or removal of surgical wound dressing: Secondary | ICD-10-CM | POA: Diagnosis not present

## 2022-08-22 DIAGNOSIS — Z48817 Encounter for surgical aftercare following surgery on the skin and subcutaneous tissue: Secondary | ICD-10-CM | POA: Diagnosis not present

## 2022-08-26 ENCOUNTER — Encounter: Payer: Self-pay | Admitting: Internal Medicine

## 2022-08-26 NOTE — Progress Notes (Signed)
Annual Screening/Preventative Visit &  Comprehensive Evaluation &  Examination   Future Appointments  Date Time Provider Department  08/27/2022  3:00 PM [name], MD [facility]  12/11/2022  3:00 PM Darrol Jump, NP [facility]  09/02/2023  3:00 PM [name], MD [facility]        This very nice 88 y.o. WWF  with   HTN, HLD,  Insulin Requiring  T2_DM ,  Hypothyroidism, Vascular Dementia   and Vitamin D Deficiency  presents for a Screening /Preventative Visit & comprehensive evaluation and management of multiple medical co-morbidities.  Patient resides with her son -Konrad Dolores -  her caregiver who supervises her meds.         HTN predates since  34 . Patient's BP has been controlled at home and patient denies any cardiac symptoms as chest pain, palpitations, shortness of breath, dizziness or ankle swelling. Today's BP is at goal - 130/70        Patient's hyperlipidemia is controlled with diet and medications. Patient denies myalgias or other medication SE's. Last lipids were at goal except sl elevated Trig's :  Lab Results  Component Value Date   CHOL 140 05/22/2022   HDL 56 05/22/2022   LDLCALC 59 05/22/2022   TRIG 175 (H) 05/22/2022   CHOLHDL 2.5 05/22/2022         Patient has hx/o T2_NIDDM (2003) W/CKD3a  (GFR 50 ) and patient denies reactive hypoglycemic symptoms, visual blurring, diabetic polys or paresthesias.   Patient is on Avastin Intravetreal  Inj for Wet Macular Degeneration followed closely by Dr Zadie Rhine.   Patient resides with her son -Konrad Dolores -  her caregiver who supervises her meds and administers /regulates her Insulin.  He monitors her CBG's 2 x /day and covers her with SS Novolin 70/30.   He also reports occasional  "lows" of glucose for which he tapers or holds insulin.  Last A1c was not at goal :  Lab Results  Component Value Date   HGBA1C 7.3 (H) 05/22/2022                                                     Patient has  been on Thyroid  Replacement since she had Goiter  surgery in the 1970's.                                                      Finally, patient has history of Vitamin D Deficiency and last Vitamin D was near goal (70-100) :   Lab Results  Component Value Date   VD25OH 59 05/22/2022       Current Outpatient Medications on File Prior to Visit  Medication Sig   aspirin  81 MG EC tablet Take      1 tablet        Daily   CALCIUM-MAGNESIUM-ZINC  Take 1 tablet by mouth daily.   VITAMIN D 5000 u Take      1 capsule     2 x /day   diclofenac 1 % GEL Apply to affected area up to 4 times daily for pain.   Dulaglutide (TRULICITY) 1.5 WI/0.9BD  Inject 1.5 mg into the  skin once a week.   DULoxetine 30 MG capsule TAKE 1 CAPSULE ONCE DAILY FOR MOOD   famotidine 20 MG tablet Take 20 mg by mouth daily.   furosemide (40 MG tablet TAKE ONE TABLET TWICE DAILY    LANTUS  100 UNIT/ML Solostar Pen Inject 30 units into skin once daily in the morning    levothyroxine  100 MCG tablet TAKE (1) TABLET DAILY BEFORE BREAKFAST.   losartan  100 MG tablet Take  1 tablet  Daily  for BP   Magnesium 400 MG CAPS Take 1 capsule twice Daily with meals   metFORMIN  1000 MG tablet TAKE (1) TABLET TWICE A DAY WITH MEALS    ICAPS Takle     1 tablet     Daily for Eyes   MIRALAX PO) Take 1 Scoop by mouth daily.   rosuvastatin  5 MG tablet TAKE ONE TABLET  DAILY    vitamin B-12  1000 MCG tablet Take 1,000 mcg by mouth daily.    Allergies  Allergen Reactions   Detrol [Tolterodine]      Past Medical History:  Diagnosis Date   Arthritis    GERD (gastroesophageal reflux disease)    HOH (hard of hearing)    Hyperlipidemia    Hypertension    Hypothyroid    IDDM (insulin dependent diabetes mellitus)    Leg swelling    Macular degeneration    Shortness of breath dyspnea    with exertion   Sleep apnea    does not wear CPAP   Varicose veins    Wears glasses      Health Maintenance  Topic Date Due   Medicare Annual Wellness (AWV)   Never done   Zoster Vaccines- Shingrix (1 of 2) Never done   COVID-19 Vaccine (3 - Moderna risk series) 11/29/2019   INFLUENZA VACCINE  04/09/2022   FOOT EXAM  08/20/2022   OPHTHALMOLOGY EXAM  10/01/2022   HEMOGLOBIN A1C  11/20/2022   DTaP/Tdap/Td (3 - Tdap) 10/20/2024   DEXA SCAN  Completed   HPV VACCINES  Aged Out   Pneumonia Vaccine 64+ Years old  Discontinued     Immunization History  Administered Date(s) Administered   DT (Pediatric) 10/20/2014   Influenza Split 06/04/2017   Influenza, High Dose  07/01/2016, 05/29/2018, 08/20/2021   Influenza,inj,quad 05/11/2017   Influenza 07/06/2015, 06/09/2019   Moderna Sars-Covid-2 Vacc 09/21/2019, 11/01/2019   Pneumococcal -13 04/05/2014   Pneumococcal-23 09/09/2004   Td 09/10/2003   Zoster, Live 09/09/2005     Last Colon -  04/12/2015 - Dr Watt Climes - deferred f/u due to age    Last MGM - 09/19/2020    Past Surgical History:  Procedure Laterality Date   ANTERIOR CERVICAL DECOMP/DISCECTOMY FUSION N/A 11/08/2014   Procedure: Cervical three-four,  Cervical six-seven anterior cervical decompression with fusion plating and bonegraft;  Surgeon: Ashok Pall, MD;  Location: [facility];  Service: Neurosurgery;  Laterality: N/A;  Cervical three-four,  Cervical six-seven anterior cervical decompression with fusion plating and bonegraft   BIOPSY THYROID     benign thyroid nodule removal   CATARACT EXTRACTION, BILATERAL     COLONOSCOPY W/ BIOPSIES AND POLYPECTOMY     ENDOMETRIAL BIOPSY  2000   SPINE SURGERY Left  11/08/2014   Left ACDF    TUBAL LIGATION     VARICOSE VEIN SURGERY       Family History  Problem Relation Age of Onset   Cancer Mother  colon   Cancer Father        GI   Breast cancer Neg Hx      Social History   Tobacco Use   Smoking status: Never   Smokeless tobacco: Never  Substance Use Topics   Alcohol use: No   Drug use: No      ROS Constitutional: Denies fever, chills, weight loss/gain,  headaches, insomnia,  night sweats, and change in appetite. Does c/o fatigue. Eyes: Denies redness, blurred vision, diplopia, discharge, itchy, watery eyes.  ENT: Denies discharge, congestion, post nasal drip, epistaxis, sore throat, earache, hearing loss, dental pain, Tinnitus, Vertigo, Sinus pain, snoring.  Cardio: Denies chest pain, palpitations, irregular heartbeat, syncope, dyspnea, diaphoresis, orthopnea, PND, claudication, edema Respiratory: denies cough, dyspnea, DOE, pleurisy, hoarseness, laryngitis, wheezing.  Gastrointestinal: Denies dysphagia, heartburn, reflux, water brash, pain, cramps, nausea, vomiting, bloating, diarrhea, constipation, hematemesis, melena, hematochezia, jaundice, hemorrhoids Genitourinary: Denies dysuria, frequency, urgency, nocturia, hesitancy, discharge, hematuria, flank pain Breast: Breast lumps, nipple discharge, bleeding.  Musculoskeletal: Denies arthralgia, myalgia, stiffness, Jt. Swelling, pain, limp, and strain/sprain. Denies falls. Skin: Denies puritis, rash, hives, warts, acne, eczema, changing in skin lesion Neuro: No weakness, tremor, incoordination, spasms, paresthesia, pain Psychiatric: Denies confusion, memory loss, sensory loss. Denies Depression. Endocrine: Denies change in weight, skin, hair change, nocturia, and paresthesia, diabetic polys, visual blurring, hyper / hypo glycemic episodes.  Heme/Lymph: No excessive bleeding, bruising, enlarged lymph nodes.  Physical Exam  BP 130/70   Pulse 76   Temp 97.9 F (36.6 C)   Resp 17   Ht '5\' 2"'$  (1.575 m)   Wt 170 lb 9.6 oz (77.4 kg)   SpO2 99%   BMI 31.20 kg/m   General Appearance: Over nourished  and in no apparent distress.  Eyes: PERRLA, EOMs, conjunctiva no swelling or erythema. 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  Sinuses: No frontal/maxillary tenderness ENT/Mouth: EACs patent / TMs  nl. Nares clear without erythema, swelling, mucoid exudates. Oral hygiene is good. No erythema,  swelling, or exudate. Tongue normal, non-obstructing. Tonsils not swollen or erythematous. Hearing normal.  Neck: Supple, thyroid not palpable. No bruits, nodes or JVD. Respiratory: Respiratory effort normal.  BS equal and clear bilateral without rales, rhonci, wheezing or stridor. Cardio: Heart sounds are normal with regular rate and rhythm and no murmurs, rubs or gallops. Peripheral pulses are normal and equal bilaterally without edema. No aortic or femoral bruits. Chest: symmetric with normal excursions and percussion. Breasts: Deferred to [facility].  Abdomen: Flat, soft with bowel sounds active. Nontender, no guarding, rebound, hernias, masses, or organomegaly.  Lymphatics: Non tender without lymphadenopathy.  Musculoskeletal: Full ROM all peripheral extremities, joint stability, 5/5 strength, and normal gait. Skin: Warm and dry without rashes, lesions, cyanosis, clubbing or  ecchymosis.  Neuro: Cranial nerves intact, reflexes equal bilaterally. Normal muscle tone, no cerebellar symptoms. Sensation intact.  Pysch: Alert and oriented x 3, pleasant but flat affect. Insight and Judgment are poor.    Assessment and Plan  1. Annual Preventative Screening Examination   2. Essential hypertension  - EKG 12-Lead - Urinalysis, Routine w reflex microscopic - Microalbumin / creatinine urine ratio - CBC with Differential/Platelet - COMPLETE METABOLIC PANEL WITH GFR - Magnesium - TSH  3. Hyperlipidemia associated with type 2 diabetes mellitus ([place])  - EKG 12-Lead - Lipid panel - TSH  4. Type 2 diabetes mellitus with stage 3a chronic kidney  disease, with long-term current use of insulin (HCC)  - EKG 12-Lead - HM DIABETES FOOT EXAM - PR LOW EXTEMITY NEUR EXAM DOCUM - Hemoglobin A1c  5. Vitamin D deficiency  - VITAMIN D 25 Hydroxy   6. Hypothyroidism, unspecified type  - TSH  7. Vascular dementia without behavioral disturbance (HCC)  - Lipid  panel  8. Screening for colorectal cancer  - POC Hemoccult Bld/Stl   9. Screening for heart disease  - EKG 12-Lead  10. Medication management  - Urinalysis, Routine w reflex microscopic - Microalbumin / creatinine urine ratio - CBC with Differential/Platelet - COMPLETE METABOLIC PANEL WITH GFR - Magnesium - Lipid panel - TSH - Hemoglobin A1c - VITAMIN D 25 Hydroxy   11. Need for immunization against influenza  - Flu vaccine HIGH DOSE PF (Fluzone High dose)           Patient was counseled in prudent diet to achieve/maintain BMI less than 25 for weight control, BP monitoring, regular exercise and medications. Discussed med's effects and SE's. Screening labs and tests as requested with regular follow-up as recommended. Over 40 minutes of exam, counseling, chart review and high complex critical decision making was performed.   Kirtland Bouchard, MD

## 2022-08-26 NOTE — Patient Instructions (Signed)
Due to recent changes in healthcare laws, you Dudash see the results of your imaging and laboratory studies on MyChart before your provider has had a chance to review them.  We understand that in some cases there Gin be results that are confusing or concerning to you. Not all laboratory results come back in the same time frame and the provider Hoback be waiting for multiple results in order to interpret others.  Please give [place] 48 hours in order for your provider to thoroughly review all the results before contacting the office for clarification of your results.   +++++++++++++++++++++++++  Vit D  & Vit C 1,000 mg   are recommended to help protect  against the Covid-19 and other Corona viruses.    Also it's recommended  to take  Zinc 50 mg  to help  protect against the Covid-19   and best place to get  is also on [email or web address]  and don't pay more than 6-8 cents /pill !  ================================ Coronavirus (COVID-19) Are you at risk?  Are you at risk for the Coronavirus (COVID-19)?  To be considered HIGH RISK for Coronavirus (COVID-19), you have to meet the following criteria:  Traveled to [place], [place], [place], [place] or [place]; or in the [place] to [place], [place], [place]  or [place]; and have fever, cough, and shortness of breath within the last 2 weeks of travel OR Been in close contact with a person diagnosed with COVID-19 within the last 2 weeks and have  fever, cough,and shortness of breath  IF YOU DO NOT MEET THESE CRITERIA, YOU ARE CONSIDERED LOW RISK FOR COVID-19.  What to do if you are HIGH RISK for COVID-19?  If you are having a medical emergency, call 911. Seek medical care right away. Before you go to a doctor's office, urgent care or emergency department,  call ahead and tell them about your recent travel, contact with someone diagnosed with COVID-19   and your symptoms.  You should receive instructions from your physician's office regarding next  steps of care.  When you arrive at healthcare provider, tell the healthcare staff immediately you have returned from  visiting [place], [place], [place], [place] or [place]; or traveled in the [place] to [place], [place],  [place] or [place] in the last two weeks or you have been in close contact with a person diagnosed with  COVID-19 in the last 2 weeks.   Tell the health care staff about your symptoms: fever, cough and shortness of breath. After you have been seen by a medical provider, you will be either: Tested for (COVID-19) and discharged home on quarantine except to seek medical care if  symptoms worsen, and asked to  Stay home and avoid contact with others until you get your results (4-5 days)  Avoid travel on public transportation if possible (such as bus, train, or airplane) or Sent to the Emergency Department by EMS for evaluation, COVID-19 testing  and  possible admission depending on your condition and test results.  What to do if you are LOW RISK for COVID-19?  Reduce your risk of any infection by using the same precautions used for avoiding the common cold or flu:  Wash your hands often with soap and warm water for at least 20 seconds.  If soap and water are not readily available,  use an alcohol-based hand sanitizer with at least 60% alcohol.  If coughing or sneezing, cover your mouth and nose by coughing  or sneezing into the elbow areas of your shirt or coat,  into a tissue or into your sleeve (not your hands). Avoid shaking hands with others and consider head nods or verbal greetings only. Avoid touching your eyes, nose, or mouth with unwashed hands.  Avoid close contact with people who are sick. Avoid places or events with large numbers of people in one location, like concerts or sporting events. Carefully consider travel plans you have or are making. If you are planning any travel outside or inside the [place], visit the CDC's Travelers' Health webpage for the  latest health notices. If you have some symptoms but not all symptoms, continue to monitor at home and seek medical attention  if your symptoms worsen. If you are having a medical emergency, call 911.   >>>>>>>>>>>>>>>>>>>>>>>>>>>>>>>>>  We Do NOT Approve of [employer] > > > > > > > > > > > > > > > > > > > > > > > > > > > > > > > > > > > > > > >  Preventive Care for Adults  A healthy lifestyle and preventive care can promote health and wellness. Preventive health guidelines for women include the following key practices. A routine yearly physical is a good way to check with your health care provider about your health and preventive screening. It is a chance to share any concerns and updates on your health and to receive a thorough exam. Visit your dentist for a routine exam and preventive care every 6 months. Brush your teeth twice a day and floss once a day. Good oral hygiene prevents tooth decay and gum disease. The frequency of eye exams is based on your age, health, family medical history, use of contact lenses, and other factors. Follow your health care provider's recommendations for frequency of eye exams. Eat a healthy diet. Foods like vegetables, fruits, whole grains, low-fat dairy products, and lean protein foods contain the nutrients you need without too many calories. Decrease your intake of foods high in solid fats, added sugars, and salt. Eat the right amount of calories for you. Get information about a proper diet from your health care provider, if necessary. Regular physical exercise is one of the most important things you can do for your health. Most adults should get at least 150 minutes of moderate-intensity exercise (any activity that increases your heart rate and causes you to sweat) each week. In addition, most adults need muscle-strengthening exercises on 2 or more days a week. Maintain a healthy weight. The body mass index (BMI) is a screening tool to identify  possible weight problems. It provides an estimate of body fat based on height and weight. Your health care provider can find your BMI and can help you achieve or maintain a healthy weight. For adults 20 years and older: A BMI below 18.5 is considered underweight. A BMI of 18.5 to 24.9 is normal. A BMI of 25 to 29.9 is considered overweight. A BMI of 30 and above is considered obese. Maintain normal blood lipids and cholesterol levels by exercising and minimizing your intake of saturated fat. Eat a balanced diet with plenty of fruit and vegetables. If your lipid or cholesterol levels are high, you are over 50, or you are at high risk for heart disease, you may need your cholesterol levels checked more frequently. Ongoing high lipid and cholesterol levels should be treated with medicines if diet and exercise are not working. If you smoke, find out from  your health care provider how to quit. If you do not use tobacco, do not start. Lung cancer screening is recommended for adults aged 55-80 years who are at high risk for developing lung cancer because of a history of smoking. A yearly low-dose CT scan of the lungs is recommended for people who have at least a 30-pack-year history of smoking and are a current smoker or have quit within the past 15 years. A pack year of smoking is smoking an average of 1 pack of cigarettes a day for 1 year (for example: 1 pack a day for 30 years or 2 packs a day for 15 years). Yearly screening should continue until the smoker has stopped smoking for at least 15 years. Yearly screening should be stopped for people who develop a health problem that would prevent them from having lung cancer treatment. Avoid use of street drugs. Do not share needles with anyone. Ask for help if you need support or instructions about stopping the use of drugs. High blood pressure causes heart disease and increases the risk of stroke.  Ongoing high blood pressure should be treated with medicines if  weight loss and exercise do not work. If you are 55-79 years old, ask your health care provider if you should take aspirin to prevent strokes. Diabetes screening involves taking a blood sample to check your fasting blood sugar level. This should be done once every 3 years, after age 45, if you are within normal weight and without risk factors for diabetes. Testing should be considered at a younger age or be carried out more frequently if you are overweight and have at least 1 risk factor for diabetes. Breast cancer screening is essential preventive care for women. You should practice "breast self-awareness." This means understanding the normal appearance and feel of your breasts and may include breast self-examination. Any changes detected, no matter how small, should be reported to a health care provider. Women in their 20s and 30s should have a clinical breast exam (CBE) by a health care provider as part of a regular health exam every 1 to 3 years. After age 40, women should have a CBE every year. Starting at age 40, women should consider having a mammogram (breast X-ray test) every year. Women who have a family history of breast cancer should talk to their health care provider about genetic screening. Women at a high risk of breast cancer should talk to their health care providers about having an MRI and a mammogram every year. Breast cancer gene (BRCA)-related cancer risk assessment is recommended for women who have family members with BRCA-related cancers. BRCA-related cancers include breast, ovarian, tubal, and peritoneal cancers. Having family members with these cancers may be associated with an increased risk for harmful changes (mutations) in the breast cancer genes BRCA1 and BRCA2. Results of the assessment will determine the need for genetic counseling and BRCA1 and BRCA2 testing. Routine pelvic exams to screen for cancer are no longer recommended for nonpregnant women who are considered low risk for  cancer of the pelvic organs (ovaries, uterus, and vagina) and who do not have symptoms. Ask your health care provider if a screening pelvic exam is right for you. If you have had past treatment for cervical cancer or a condition that could lead to cancer, you need Pap tests and screening for cancer for at least 20 years after your treatment. If Pap tests have been discontinued, your risk factors (such as having a new sexual partner) need to be   reassessed to determine if screening should be resumed. Some women have medical problems that increase the chance of getting cervical cancer. In these cases, your health care provider may recommend more frequent screening and Pap tests.  Colorectal cancer can be detected and often prevented. Most routine colorectal cancer screening begins at the age of 9 years and continues through age 47 years. However, your health care provider may recommend screening at an earlier age if you have risk factors for colon cancer. On a yearly basis, your health care provider may provide home test kits to check for hidden blood in the stool. Use of a small camera at the end of a tube, to directly examine the colon (sigmoidoscopy or colonoscopy), can detect the earliest forms of colorectal cancer. Talk to your health care provider about this at age 66, when routine screening begins.  Direct exam of the colon should be repeated every 5-10 years through age 98 years, unless early forms of pre-cancerous polyps or small growths are found. Osteoporosis is a disease in which the bones lose minerals and strength with aging. This can result in serious bone fractures or breaks. The risk of osteoporosis can be identified using a bone density scan. Women ages 48 years and over and women at risk for fractures or osteoporosis should discuss screening with their health care providers. Ask your health care provider whether you should take a calcium supplement or vitamin D to reduce the rate of  osteoporosis. Menopause can be associated with physical symptoms and risks. Hormone replacement therapy is available to decrease symptoms and risks. You should talk to your health care provider about whether hormone replacement therapy is right for you. Use sunscreen. Apply sunscreen liberally and repeatedly throughout the day. You should seek shade when your shadow is shorter than you. Protect yourself by wearing long sleeves, pants, a wide-brimmed hat, and sunglasses year round, whenever you are outdoors. Once a month, do a whole body skin exam, using a mirror to look at the skin on your back. Tell your health care provider of new moles, moles that have irregular borders, moles that are larger than a pencil eraser, or moles that have changed in shape or color. Stay current with required vaccines (immunizations). Influenza vaccine. All adults should be immunized every year. Tetanus, diphtheria, and acellular pertussis (Td, Tdap) vaccine. Pregnant women should receive 1 dose of Tdap vaccine during each pregnancy. The dose should be obtained regardless of the length of time since the last dose. Immunization is preferred during the 27th-36th week of gestation. An adult who has not previously received Tdap or who does not know her vaccine status should receive 1 dose of Tdap. This initial dose should be followed by tetanus and diphtheria toxoids (Td) booster doses every 10 years. Adults with an unknown or incomplete history of completing a 3-dose immunization series with Td-containing vaccines should begin or complete a primary immunization series including a Tdap dose. Adults should receive a Td booster every 10 years.  Zoster vaccine. One dose is recommended for adults aged 47 years or older unless certain conditions are present.  Pneumococcal 13-valent conjugate (PCV13) vaccine. When indicated, a person who is uncertain of her immunization history and has no record of immunization should receive the PCV13  vaccine. An adult aged 63 years or older who has certain medical conditions and has not been previously immunized should receive 1 dose of PCV13 vaccine. This PCV13 should be followed with a dose of pneumococcal polysaccharide (PPSV23) vaccine. The PPSV23  vaccine dose should be obtained at least 1 or more year(s) after the dose of PCV13 vaccine. An adult aged 19 years or older who has certain medical conditions and previously received 1 or more doses of PPSV23 vaccine should receive 1 dose of PCV13. The PCV13 vaccine dose should be obtained 1 or more years after the last PPSV23 vaccine dose.  Pneumococcal polysaccharide (PPSV23) vaccine. When PCV13 is also indicated, PCV13 should be obtained first. All adults aged 65 years and older should be immunized. An adult younger than age 65 years who has certain medical conditions should be immunized. Any person who resides in a nursing home or long-term care facility should be immunized. An adult smoker should be immunized. People with an immunocompromised condition and certain other conditions should receive both PCV13 and PPSV23 vaccines. People with human immunodeficiency virus (HIV) infection should be immunized as soon as possible after diagnosis. Immunization during chemotherapy or radiation therapy should be avoided. Routine use of PPSV23 vaccine is not recommended for American Indians, [place] Natives, or people younger than 65 years unless there are medical conditions that require PPSV23 vaccine. When indicated, people who have unknown immunization and have no record of immunization should receive PPSV23 vaccine. One-time revaccination 5 years after the first dose of PPSV23 is recommended for people aged 19-64 years who have chronic kidney failure, nephrotic syndrome, asplenia, or immunocompromised conditions. People who received 1-2 doses of PPSV23 before age 65 years should receive another dose of PPSV23 vaccine at age 65 years or later if at least 5 years have  passed since the previous dose. Doses of PPSV23 are not needed for people immunized with PPSV23 at or after age 65 years.  Preventive Services / Frequency  Ages 65 years and over Blood pressure check. Lipid and cholesterol check. Lung cancer screening. / Every year if you are aged 55-80 years and have a 30-pack-year history of smoking and currently smoke or have quit within the past 15 years. Yearly screening is stopped once you have quit smoking for at least 15 years or develop a health problem that would prevent you from having lung cancer treatment. Clinical breast exam.** / Every year after age 40 years.  BRCA-related cancer risk assessment.** / For women who have family members with a BRCA-related cancer (breast, ovarian, tubal, or peritoneal cancers). Mammogram.** / Every year beginning at age 40 years and continuing for as long as you are in good health. Consult with your health care provider. Pap test.** / Every 3 years starting at age 30 years through age 65 or 70 years with 3 consecutive normal Pap tests. Testing can be stopped between 65 and 70 years with 3 consecutive normal Pap tests and no abnormal Pap or HPV tests in the past 10 years. Fecal occult blood test (FOBT) of stool. / Every year beginning at age 50 years and continuing until age 75 years. You may not need to do this test if you get a colonoscopy every 10 years. Flexible sigmoidoscopy or colonoscopy.** / Every 5 years for a flexible sigmoidoscopy or every 10 years for a colonoscopy beginning at age 50 years and continuing until age 75 years. Hepatitis C blood test.** / For all people born from 1945 through 1965 and any individual with known risks for hepatitis C. Osteoporosis screening.** / A one-time screening for women ages 65 years and over and women at risk for fractures or osteoporosis. Skin self-exam. / Monthly. Influenza vaccine. / Every year. Tetanus, diphtheria, and acellular pertussis (Tdap/Td) vaccine.** /   1 dose  of Td every 10 years. Zoster vaccine.** / 1 dose for adults aged 60 years or older. Pneumococcal 13-valent conjugate (PCV13) vaccine.** / Consult your health care provider. Pneumococcal polysaccharide (PPSV23) vaccine.** / 1 dose for all adults aged 65 years and older. Screening for abdominal aortic aneurysm (AAA)  by ultrasound is recommended for people who have history of high blood pressure or who are current or former smokers. ++++++++++++++++++++ Recommend Adult Low Dose Aspirin or  coated  Aspirin 81 mg daily  To reduce risk of Colon Cancer 40 %,  Skin Cancer 26 % ,  Melanoma 46%  and  Pancreatic cancer 60% ++++++++++++++++++++ Vitamin D goal  is between 70-100.  Please make sure that you are taking your Vitamin D as directed.  It is very important as a natural anti-inflammatory  helping hair, skin, and nails, as well as reducing stroke and heart attack risk.  It helps your bones and helps with mood. It also decreases numerous cancer risks so please take it as directed.  Low Vit D is associated with a 200-300% higher risk for CANCER  and 200-300% higher risk for HEART   ATTACK  &  STROKE.   ...................................... It is also associated with higher death rate at younger ages,  autoimmune diseases like Rheumatoid arthritis, Lupus, Multiple Sclerosis.    Also many other serious conditions, like depression, Alzheimer's Dementia, infertility, muscle aches, fatigue, fibromyalgia - just to name a few. ++++++++++++++++++ Recommend the book "The END of DIETING" by Dr Joel Fuhrman  & the book "The END of DIABETES " by Dr Joel Fuhrman At [employer] - get book & Audio CD's    Being diabetic has a  300% increased risk for heart attack, stroke, cancer, and alzheimer- type vascular dementia. It is very important that you work harder with diet by avoiding all foods that are white. Avoid white rice (brown & wild rice is OK), white potatoes (sweetpotatoes in moderation is OK),  White bread or wheat bread or anything made out of white flour like bagels, donuts, rolls, buns, biscuits, cakes, pastries, cookies, pizza crust, and pasta (made from white flour & egg whites) - vegetarian pasta or spinach or wheat pasta is OK. Multigrain breads like Arnold's or [employer], or multigrain sandwich thins or flatbreads.  Diet, exercise and weight loss can reverse and cure diabetes in the early stages.  Diet, exercise and weight loss is very important in the control and prevention of complications of diabetes which affects every system in your body, ie. Brain - dementia/stroke, eyes - glaucoma/blindness, heart - heart attack/heart failure, kidneys - dialysis, stomach - gastric paralysis, intestines - malabsorption, nerves - severe painful neuritis, circulation - gangrene & loss of a leg(s), and finally cancer and Alzheimers.    I recommend avoid fried & greasy foods,  sweets/candy, white rice (brown or wild rice or Quinoa is OK), white potatoes (sweet potatoes are OK) - anything made from white flour - bagels, doughnuts, rolls, buns, biscuits,white and wheat breads, pizza crust and traditional pasta made of white flour & egg white(vegetarian pasta or spinach or wheat pasta is OK).  Multi-grain bread is OK - like multi-grain flat bread or sandwich thins. Avoid alcohol in excess. Exercise is also important.    Eat all the vegetables you want - avoid meat, especially red meat and dairy - especially cheese.  Cheese is the most concentrated form of trans-fats which is the worst thing to clog up our arteries. Veggie cheese is OK   which can be found in the fresh produce section at [facility] or [employer] or [employer]  +++++++++++++++++++ DASH Eating Plan  DASH stands for "Dietary Approaches to Stop Hypertension."   The DASH eating plan is a healthy eating plan that has been shown to reduce high blood pressure (hypertension). Additional health benefits may include reducing the risk of type 2  diabetes mellitus, heart disease, and stroke. The DASH eating plan may also help with weight loss. WHAT DO I NEED TO KNOW ABOUT THE DASH EATING PLAN? For the DASH eating plan, you will follow these general guidelines: Choose foods with a percent daily value for sodium of less than 5% (as listed on the food label). Use salt-free seasonings or herbs instead of table salt or sea salt. Check with your health care provider or pharmacist before using salt substitutes. Eat lower-sodium products, often labeled as "lower sodium" or "no salt added." Eat fresh foods. Eat more vegetables, fruits, and low-fat dairy products. Choose whole grains. Look for the word "whole" as the first word in the ingredient list. Choose fish  Limit sweets, desserts, sugars, and sugary drinks. Choose heart-healthy fats. Eat veggie cheese  Eat more home-cooked food and less restaurant, buffet, and fast food. Limit fried foods. Cook foods using methods other than frying. Limit canned vegetables. If you do use them, rinse them well to decrease the sodium. When eating at a restaurant, ask that your food be prepared with less salt, or no salt if possible.                      WHAT FOODS CAN I EAT? Read Dr Fara Olden Fuhrman's books on The End of Dieting & The End of Diabetes  Grains Whole grain or whole wheat bread. Brown rice. Whole grain or whole wheat pasta. Quinoa, bulgur, and whole grain cereals. Low-sodium cereals. Corn or whole wheat flour tortillas. Whole grain cornbread. Whole grain crackers. Low-sodium crackers.  Vegetables Fresh or frozen vegetables (raw, steamed, roasted, or grilled). Low-sodium or reduced-sodium tomato and vegetable juices. Low-sodium or reduced-sodium tomato sauce and paste. Low-sodium or reduced-sodium canned vegetables.   Fruits All fresh, canned (in natural juice), or frozen fruits.  Protein Products  All fish and seafood.  Dried beans, peas, or lentils. Unsalted nuts and seeds. Unsalted  canned beans.  Dairy Low-fat dairy products, such as skim or 1% milk, 2% or reduced-fat cheeses, low-fat ricotta or cottage cheese, or plain low-fat yogurt. Low-sodium or reduced-sodium cheeses.  Fats and Oils Tub margarines without trans fats. Light or reduced-fat mayonnaise and salad dressings (reduced sodium). Avocado. Safflower, olive, or canola oils. Natural peanut or almond butter.  Other Unsalted popcorn and pretzels. The items listed above may not be a complete list of recommended foods or beverages. Contact your dietitian for more options.  +++++++++++++++  WHAT FOODS ARE NOT RECOMMENDED? Grains/ White flour or wheat flour White bread. White pasta. White rice. Refined cornbread. Bagels and croissants. Crackers that contain trans fat.  Vegetables  Creamed or fried vegetables. Vegetables in a . Regular canned vegetables. Regular canned tomato sauce and paste. Regular tomato and vegetable juices.  Fruits Dried fruits. Canned fruit in light or heavy syrup. Fruit juice.  Meat and Other Protein Products Meat in general - RED meat & White meat.  Fatty cuts of meat. Ribs, chicken wings, all processed meats as bacon, sausage, bologna, salami, fatback, hot dogs, bratwurst and packaged luncheon meats.  Dairy Whole or 2% milk, cream, half-and-half, and cream cheese.  Whole-fat or sweetened yogurt. Full-fat cheeses or blue cheese. Non-dairy creamers and whipped toppings. Processed cheese, cheese spreads, or cheese curds.  Condiments Onion and garlic salt, seasoned salt, table salt, and sea salt. Canned and packaged gravies. Worcestershire sauce. Tartar sauce. Barbecue sauce. Teriyaki sauce. Soy sauce, including reduced sodium. Steak sauce. Fish sauce. Oyster sauce. Cocktail sauce. Horseradish. Ketchup and mustard. Meat flavorings and tenderizers. Bouillon cubes. Hot sauce. Tabasco sauce. Marinades. Taco seasonings. Relishes.  Fats and Oils Butter, stick margarine, lard, shortening and  bacon fat. Coconut, palm kernel, or palm oils. Regular salad dressings.  Pickles and olives. Salted popcorn and pretzels.  The items listed above may not be a complete list of foods and beverages to avoid.   

## 2022-08-27 ENCOUNTER — Ambulatory Visit (INDEPENDENT_AMBULATORY_CARE_PROVIDER_SITE_OTHER): Payer: Medicare Other | Admitting: Internal Medicine

## 2022-08-27 ENCOUNTER — Encounter: Payer: Self-pay | Admitting: Internal Medicine

## 2022-08-27 VITALS — BP 130/70 | HR 76 | Temp 97.9°F | Resp 17 | Ht 62.0 in | Wt 170.6 lb

## 2022-08-27 DIAGNOSIS — E1122 Type 2 diabetes mellitus with diabetic chronic kidney disease: Secondary | ICD-10-CM

## 2022-08-27 DIAGNOSIS — Z0000 Encounter for general adult medical examination without abnormal findings: Secondary | ICD-10-CM

## 2022-08-27 DIAGNOSIS — E559 Vitamin D deficiency, unspecified: Secondary | ICD-10-CM

## 2022-08-27 DIAGNOSIS — Z79899 Other long term (current) drug therapy: Secondary | ICD-10-CM

## 2022-08-27 DIAGNOSIS — Z0001 Encounter for general adult medical examination with abnormal findings: Secondary | ICD-10-CM

## 2022-08-27 DIAGNOSIS — Z136 Encounter for screening for cardiovascular disorders: Secondary | ICD-10-CM

## 2022-08-27 DIAGNOSIS — I10 Essential (primary) hypertension: Secondary | ICD-10-CM

## 2022-08-27 DIAGNOSIS — N1831 Chronic kidney disease, stage 3a: Secondary | ICD-10-CM | POA: Diagnosis not present

## 2022-08-27 DIAGNOSIS — Z1211 Encounter for screening for malignant neoplasm of colon: Secondary | ICD-10-CM

## 2022-08-27 DIAGNOSIS — E1169 Type 2 diabetes mellitus with other specified complication: Secondary | ICD-10-CM

## 2022-08-27 DIAGNOSIS — F0150 Vascular dementia without behavioral disturbance: Secondary | ICD-10-CM

## 2022-08-27 DIAGNOSIS — E039 Hypothyroidism, unspecified: Secondary | ICD-10-CM

## 2022-08-27 DIAGNOSIS — Z23 Encounter for immunization: Secondary | ICD-10-CM

## 2022-08-28 ENCOUNTER — Encounter: Payer: Medicare Other | Admitting: Internal Medicine

## 2022-08-28 ENCOUNTER — Other Ambulatory Visit: Payer: Self-pay | Admitting: Internal Medicine

## 2022-08-28 DIAGNOSIS — Z79899 Other long term (current) drug therapy: Secondary | ICD-10-CM

## 2022-08-28 DIAGNOSIS — E559 Vitamin D deficiency, unspecified: Secondary | ICD-10-CM

## 2022-08-28 LAB — COMPLETE METABOLIC PANEL WITH GFR
AG Ratio: 1.3 (calc) (ref 1.0–2.5)
ALT: 13 U/L (ref 6–29)
AST: 16 U/L (ref 10–35)
Albumin: 4 g/dL (ref 3.6–5.1)
Alkaline phosphatase (APISO): 53 U/L (ref 37–153)
BUN/Creatinine Ratio: 22 (calc) (ref 6–22)
BUN: 30 mg/dL — ABNORMAL HIGH (ref 7–25)
CO2: 33 mmol/L — ABNORMAL HIGH (ref 20–32)
Calcium: 9.4 mg/dL (ref 8.6–10.4)
Chloride: 96 mmol/L — ABNORMAL LOW (ref 98–110)
Creat: 1.35 mg/dL — ABNORMAL HIGH (ref 0.60–0.95)
Globulin: 3.2 g/dL (calc) (ref 1.9–3.7)
Glucose, Bld: 155 mg/dL — ABNORMAL HIGH (ref 65–99)
Potassium: 5.1 mmol/L (ref 3.5–5.3)
Sodium: 143 mmol/L (ref 135–146)
Total Bilirubin: 0.3 mg/dL (ref 0.2–1.2)
Total Protein: 7.2 g/dL (ref 6.1–8.1)
eGFR: 38 mL/min/{1.73_m2} — ABNORMAL LOW (ref >=60)

## 2022-08-28 LAB — CBC WITH DIFFERENTIAL/PLATELET
Absolute Monocytes: 1117 cells/uL — ABNORMAL HIGH (ref 200–950)
Basophils Absolute: 73 cells/uL (ref 0–200)
Basophils Relative: 0.5 %
Eosinophils Absolute: 537 cells/uL — ABNORMAL HIGH (ref 15–500)
Eosinophils Relative: 3.7 %
HCT: 40 % (ref 35.0–45.0)
Hemoglobin: 13.3 g/dL (ref 11.7–15.5)
Lymphs Abs: 4176 cells/uL — ABNORMAL HIGH (ref 850–3900)
MCH: 28.9 pg (ref 27.0–33.0)
MCHC: 33.3 g/dL (ref 32.0–36.0)
MCV: 87 fL (ref 80.0–100.0)
MPV: 11.2 fL (ref 7.5–12.5)
Monocytes Relative: 7.7 %
Neutro Abs: 8599 cells/uL — ABNORMAL HIGH (ref 1500–7800)
Neutrophils Relative %: 59.3 %
Platelets: 442 10*3/uL — ABNORMAL HIGH (ref 140–400)
RBC: 4.6 10*6/uL (ref 3.80–5.10)
RDW: 12.1 % (ref 11.0–15.0)
Total Lymphocyte: 28.8 %
WBC: 14.5 10*3/uL — ABNORMAL HIGH (ref 3.8–10.8)

## 2022-08-28 LAB — URINALYSIS, ROUTINE W REFLEX MICROSCOPIC
Bilirubin Urine: NEGATIVE
Glucose, UA: NEGATIVE
Hyaline Cast: NONE SEEN /LPF
Ketones, ur: NEGATIVE
Nitrite: NEGATIVE
Specific Gravity, Urine: 1.01 (ref 1.001–1.035)
WBC, UA: >=60 /HPF — AB (ref 0–5)
pH: 7 (ref 5.0–8.0)

## 2022-08-28 LAB — LIPID PANEL
Cholesterol: 152 mg/dL (ref <200)
HDL: 62 mg/dL (ref >=50)
LDL Cholesterol (Calc): 66 mg/dL (calc)
Non-HDL Cholesterol (Calc): 90 mg/dL (calc) (ref <130)
Total CHOL/HDL Ratio: 2.5 (calc) (ref <5.0)
Triglycerides: 165 mg/dL — ABNORMAL HIGH (ref <150)

## 2022-08-28 LAB — MAGNESIUM: Magnesium: 1.2 mg/dL — ABNORMAL LOW (ref 1.5–2.5)

## 2022-08-28 LAB — MICROSCOPIC MESSAGE

## 2022-08-28 LAB — MICROALBUMIN / CREATININE URINE RATIO
Creatinine, Urine: 53 mg/dL (ref 20–275)
Microalb Creat Ratio: 136 mcg/mg creat — ABNORMAL HIGH (ref <30)
Microalb, Ur: 7.2 mg/dL

## 2022-08-28 LAB — HEMOGLOBIN A1C
Hgb A1c MFr Bld: 7.6 % of total Hgb — ABNORMAL HIGH (ref <5.7)
Mean Plasma Glucose: 171 mg/dL
eAG (mmol/L): 9.5 mmol/L

## 2022-08-28 LAB — TSH: TSH: 2 mIU/L (ref 0.40–4.50)

## 2022-08-28 LAB — VITAMIN D 25 HYDROXY (VIT D DEFICIENCY, FRACTURES): Vit D, 25-Hydroxy: 55 ng/mL (ref 30–100)

## 2022-08-28 NOTE — Progress Notes (Signed)
<><><><><><><><><><><><><><><><><><><><><><><><><><><><><><><><><> <><><><><><><><><><><><><><><><><><><><><><><><><><><><><><><><><>  - U/A is OK - Nitrates is NEGATIVE , So No Infection  <><><><><><><><><><><><><><><><><><><><><><><><><><><><><><><><><>  -  WBC is a little elevated , So will continue to monitor  <><><><><><><><><><><><><><><><><><><><><><><><><><><><><><><><><>  - Kidney functions look a little dehydrated    Very important to drink adequate amounts of fluids to prevent permanent damage    - Recommend drink at least                                       6 bottles (16 ounces) of fluids /water /day = 96 Oz ~100 oz  - 100 oz = 3,000 cc or 3 liters / day  - >>                                                         That's 1 &1/2 bottles of a 2 liter soda bottle /day !  <><><><><><><><><><><><><><><><><><><><><><><><><><><><><><><><><>  -  Magnesium  = 1.2     -  very  low  - goal is betw 2.0 - 2.5,   - So..............Marland Kitchen  Recommend that you take Re-Start taking                                                     Magnesium 500 mg tablet 3 x /day with Meals   - also important to eat lots of  leafy green vegetables   - spinach - Kale - collards - greens - okra - asparagus  - broccoli - quinoa - squash - almonds   - black, red, white beans  -  peas - green beans <><><><><><><><><><><><><><><><><><><><><><><><><><><><><><><><><>  -  Total Chol = 152 - >  Excellent   - Very low risk for Heart Attack  / Stroke <><><><><><><><><><><><><><><><><><><><><><><><><><><><><><><><><> <><><><><><><><><><><><><><><><><><><><><><><><><><><><><><><><><>  -  A1c = 7.6% - shows blood sugar is too high                 ( Ideal or Goal is less than 5.8% )  Being diabetic has a  300% increased risk for heart attack,                                              stroke, cancer, and alzheimer- type vascular dementia.   It is very important that you work harder with diet by                                avoiding all foods that are white except chicken, fish & calliflower.  - Avoid white rice  (brown & wild rice is OK),   - Avoid white potatoes  (sweet potatoes in moderation is OK),   White bread or wheat bread or anything made out of   white flour like bagels, donuts, rolls, buns, biscuits, cakes,  - pastries, cookies, pizza crust, and pasta (made from  white flour & egg whites)   - vegetarian pasta or spinach  or wheat pasta is OK.  - Multigrain breads like Arnold's, Pepper[employer] or   multigrain sandwich thins or high fiber breads like   Eureka bread or "Dave's Killer" breads that are  4 to 5 grams fiber per slice !  are best.    Diet, exercise and weight loss can reverse and cure diabetes in the early stages.   <><><><><><><><><><><><><><><><><><><><><><><><><><><><><><><><><> <><><><><><><><><><><><><><><><><><><><><><><><><><><><><><><><><>  - Vitamin D = 55 - OK - Please keep dose same  <><><><><><><><><><><><><><><><><><><><><><><><><><><><><><><><><> <><><><><><><><><><><><><><><><><><><><><><><><><><><><><><><><><>

## 2022-08-29 ENCOUNTER — Encounter: Payer: Self-pay | Admitting: Internal Medicine

## 2022-09-27 DIAGNOSIS — S81802A Unspecified open wound, left lower leg, initial encounter: Secondary | ICD-10-CM | POA: Diagnosis not present

## 2022-10-28 ENCOUNTER — Telehealth: Payer: Self-pay | Admitting: Internal Medicine

## 2022-10-28 DIAGNOSIS — H35373 Puckering of macula, bilateral: Secondary | ICD-10-CM | POA: Diagnosis not present

## 2022-10-28 DIAGNOSIS — H353213 Exudative age-related macular degeneration, right eye, with inactive scar: Secondary | ICD-10-CM | POA: Diagnosis not present

## 2022-10-28 DIAGNOSIS — H53413 Scotoma involving central area, bilateral: Secondary | ICD-10-CM | POA: Diagnosis not present

## 2022-10-28 DIAGNOSIS — H353221 Exudative age-related macular degeneration, left eye, with active choroidal neovascularization: Secondary | ICD-10-CM | POA: Diagnosis not present

## 2022-10-28 DIAGNOSIS — H35453 Secondary pigmentary degeneration, bilateral: Secondary | ICD-10-CM | POA: Diagnosis not present

## 2022-10-28 DIAGNOSIS — H35363 Drusen (degenerative) of macula, bilateral: Secondary | ICD-10-CM | POA: Diagnosis not present

## 2022-10-28 NOTE — Progress Notes (Signed)
  Chronic Care Management   Outreach Note  10/28/2022 Name: Catherine Wood MRN: [id number] DOB: 1931/07/12  Referred by: [name], MD Reason for referral : No chief complaint on file.   An unsuccessful telephone outreach was attempted today. The patient was referred to the pharmacist for assistance with care management and care coordination.   Follow Up Plan:   Tatjana Dellinger Upstream Scheduler

## 2022-10-31 DIAGNOSIS — S81802A Unspecified open wound, left lower leg, initial encounter: Secondary | ICD-10-CM | POA: Diagnosis not present

## 2022-11-04 ENCOUNTER — Telehealth: Payer: Self-pay | Admitting: Internal Medicine

## 2022-11-04 NOTE — Progress Notes (Signed)
  Chronic Care Management   Outreach Note  11/04/2022 Name: Catherine Wood MRN: [id number] DOB: Jul 06, 1931  Referred by: [name], MD Reason for referral : No chief complaint on file.   An unsuccessful telephone outreach was attempted today. The patient was referred to the pharmacist for assistance with care management and care coordination.   Follow Up Plan: WANTS TO DISCUSS WITH PCP  Tatjana Dellinger Upstream Scheduler

## 2022-11-06 DIAGNOSIS — H353221 Exudative age-related macular degeneration, left eye, with active choroidal neovascularization: Secondary | ICD-10-CM | POA: Diagnosis not present

## 2022-12-11 ENCOUNTER — Ambulatory Visit: Payer: Medicare Other | Admitting: Nurse Practitioner

## 2022-12-17 ENCOUNTER — Telehealth: Payer: Self-pay | Admitting: Internal Medicine

## 2022-12-17 NOTE — Progress Notes (Signed)
  Chronic Care Management   Note  12/17/2022 Name: Catherine Wood MRN: [id number] DOB: 23-Apr-1931  Catherine Wood is a 89 y.o. year old female who is a primary care patient of Lucky Cowboy, MD. I reached out to Catherine Wood by phone today in response to a referral sent by Catherine Wood's PCP, Lucky Cowboy, MD.   Catherine Wood was given information about Chronic Care Management services today including:  CCM service includes personalized support from designated clinical staff supervised by her physician, including individualized plan of care and coordination with other care providers 24/7 contact phone numbers for assistance for urgent and routine care needs. Service will only be billed when office clinical staff spend 20 minutes or more in a month to coordinate care. Only one practitioner may furnish and bill the service in a calendar month. The patient may stop CCM services at any time (effective at the end of the month) by phone call to the office staff.   Catherine Wood/ son verbally agreed to assistance and services provided by embedded care coordination/care management team today.  Follow up plan:   Tatjana Restaurant manager, fast food

## 2022-12-25 DIAGNOSIS — S50312A Abrasion of left elbow, initial encounter: Secondary | ICD-10-CM | POA: Diagnosis not present

## 2022-12-25 DIAGNOSIS — R296 Repeated falls: Secondary | ICD-10-CM | POA: Diagnosis not present

## 2022-12-25 DIAGNOSIS — M25522 Pain in left elbow: Secondary | ICD-10-CM | POA: Diagnosis not present

## 2022-12-25 DIAGNOSIS — W19XXXA Unspecified fall, initial encounter: Secondary | ICD-10-CM | POA: Diagnosis not present

## 2022-12-25 DIAGNOSIS — Z23 Encounter for immunization: Secondary | ICD-10-CM | POA: Diagnosis not present

## 2022-12-25 DIAGNOSIS — M79652 Pain in left thigh: Secondary | ICD-10-CM | POA: Diagnosis not present

## 2022-12-25 DIAGNOSIS — S61411A Laceration without foreign body of right hand, initial encounter: Secondary | ICD-10-CM | POA: Diagnosis not present

## 2022-12-25 DIAGNOSIS — S42202A Unspecified fracture of upper end of left humerus, initial encounter for closed fracture: Secondary | ICD-10-CM | POA: Diagnosis not present

## 2022-12-25 DIAGNOSIS — W1830XA Fall on same level, unspecified, initial encounter: Secondary | ICD-10-CM | POA: Diagnosis not present

## 2022-12-25 DIAGNOSIS — S60511A Abrasion of right hand, initial encounter: Secondary | ICD-10-CM | POA: Diagnosis not present

## 2022-12-25 DIAGNOSIS — M25552 Pain in left hip: Secondary | ICD-10-CM | POA: Diagnosis not present

## 2022-12-25 DIAGNOSIS — S42212A Unspecified displaced fracture of surgical neck of left humerus, initial encounter for closed fracture: Secondary | ICD-10-CM | POA: Diagnosis not present

## 2022-12-25 DIAGNOSIS — S42255A Nondisplaced fracture of greater tuberosity of left humerus, initial encounter for closed fracture: Secondary | ICD-10-CM | POA: Diagnosis not present

## 2022-12-25 DIAGNOSIS — M79605 Pain in left leg: Secondary | ICD-10-CM | POA: Diagnosis not present

## 2022-12-25 DIAGNOSIS — Y92009 Unspecified place in unspecified non-institutional (private) residence as the place of occurrence of the external cause: Secondary | ICD-10-CM | POA: Diagnosis not present

## 2022-12-25 DIAGNOSIS — S59902A Unspecified injury of left elbow, initial encounter: Secondary | ICD-10-CM | POA: Diagnosis not present

## 2022-12-25 DIAGNOSIS — M25512 Pain in left shoulder: Secondary | ICD-10-CM | POA: Diagnosis not present

## 2022-12-25 DIAGNOSIS — S0990XA Unspecified injury of head, initial encounter: Secondary | ICD-10-CM | POA: Diagnosis not present

## 2022-12-25 DIAGNOSIS — E119 Type 2 diabetes mellitus without complications: Secondary | ICD-10-CM | POA: Diagnosis not present

## 2022-12-25 DIAGNOSIS — I10 Essential (primary) hypertension: Secondary | ICD-10-CM | POA: Diagnosis not present

## 2022-12-25 DIAGNOSIS — S43005A Unspecified dislocation of left shoulder joint, initial encounter: Secondary | ICD-10-CM | POA: Diagnosis not present

## 2022-12-25 DIAGNOSIS — S42222A 2-part displaced fracture of surgical neck of left humerus, initial encounter for closed fracture: Secondary | ICD-10-CM | POA: Diagnosis not present

## 2022-12-25 DIAGNOSIS — S42292A Other displaced fracture of upper end of left humerus, initial encounter for closed fracture: Secondary | ICD-10-CM | POA: Diagnosis not present

## 2022-12-26 DIAGNOSIS — M79652 Pain in left thigh: Secondary | ICD-10-CM | POA: Diagnosis not present

## 2022-12-26 DIAGNOSIS — S42212A Unspecified displaced fracture of surgical neck of left humerus, initial encounter for closed fracture: Secondary | ICD-10-CM | POA: Diagnosis not present

## 2022-12-26 DIAGNOSIS — S42255A Nondisplaced fracture of greater tuberosity of left humerus, initial encounter for closed fracture: Secondary | ICD-10-CM | POA: Diagnosis not present

## 2022-12-26 DIAGNOSIS — R296 Repeated falls: Secondary | ICD-10-CM | POA: Diagnosis not present

## 2022-12-26 DIAGNOSIS — S0990XA Unspecified injury of head, initial encounter: Secondary | ICD-10-CM | POA: Diagnosis not present

## 2022-12-27 DIAGNOSIS — S42202A Unspecified fracture of upper end of left humerus, initial encounter for closed fracture: Secondary | ICD-10-CM | POA: Diagnosis not present

## 2023-01-03 DIAGNOSIS — S42202D Unspecified fracture of upper end of left humerus, subsequent encounter for fracture with routine healing: Secondary | ICD-10-CM | POA: Diagnosis not present

## 2023-01-13 ENCOUNTER — Other Ambulatory Visit: Payer: Self-pay | Admitting: Internal Medicine

## 2023-01-13 ENCOUNTER — Other Ambulatory Visit: Payer: Self-pay | Admitting: Nurse Practitioner

## 2023-01-13 DIAGNOSIS — Z79899 Other long term (current) drug therapy: Secondary | ICD-10-CM

## 2023-01-13 DIAGNOSIS — I10 Essential (primary) hypertension: Secondary | ICD-10-CM

## 2023-01-13 DIAGNOSIS — Z794 Long term (current) use of insulin: Secondary | ICD-10-CM

## 2023-01-21 ENCOUNTER — Telehealth: Payer: Medicare Other | Admitting: Pharmacist

## 2023-01-29 DIAGNOSIS — H353221 Exudative age-related macular degeneration, left eye, with active choroidal neovascularization: Secondary | ICD-10-CM | POA: Diagnosis not present

## 2023-02-04 DIAGNOSIS — S42292A Other displaced fracture of upper end of left humerus, initial encounter for closed fracture: Secondary | ICD-10-CM | POA: Diagnosis not present

## 2023-02-04 DIAGNOSIS — S42202A Unspecified fracture of upper end of left humerus, initial encounter for closed fracture: Secondary | ICD-10-CM | POA: Diagnosis not present

## 2023-03-19 ENCOUNTER — Ambulatory Visit: Payer: Medicare Other | Admitting: Internal Medicine

## 2023-03-27 ENCOUNTER — Other Ambulatory Visit: Payer: Self-pay | Admitting: Nurse Practitioner

## 2023-03-27 DIAGNOSIS — Z79899 Other long term (current) drug therapy: Secondary | ICD-10-CM

## 2023-03-27 DIAGNOSIS — E1169 Type 2 diabetes mellitus with other specified complication: Secondary | ICD-10-CM

## 2023-04-07 ENCOUNTER — Encounter: Payer: Self-pay | Admitting: Internal Medicine

## 2023-04-07 ENCOUNTER — Ambulatory Visit (INDEPENDENT_AMBULATORY_CARE_PROVIDER_SITE_OTHER): Payer: Medicare Other | Admitting: Internal Medicine

## 2023-04-07 VITALS — BP 130/70 | HR 65 | Temp 97.9°F | Resp 17 | Ht 62.0 in | Wt 170.6 lb

## 2023-04-07 DIAGNOSIS — E1165 Type 2 diabetes mellitus with hyperglycemia: Secondary | ICD-10-CM

## 2023-04-07 DIAGNOSIS — E039 Hypothyroidism, unspecified: Secondary | ICD-10-CM

## 2023-04-07 DIAGNOSIS — E785 Hyperlipidemia, unspecified: Secondary | ICD-10-CM

## 2023-04-07 DIAGNOSIS — Z794 Long term (current) use of insulin: Secondary | ICD-10-CM

## 2023-04-07 DIAGNOSIS — Z79899 Other long term (current) drug therapy: Secondary | ICD-10-CM

## 2023-04-07 DIAGNOSIS — E559 Vitamin D deficiency, unspecified: Secondary | ICD-10-CM

## 2023-04-07 DIAGNOSIS — I10 Essential (primary) hypertension: Secondary | ICD-10-CM

## 2023-04-07 DIAGNOSIS — E1169 Type 2 diabetes mellitus with other specified complication: Secondary | ICD-10-CM | POA: Diagnosis not present

## 2023-04-07 DIAGNOSIS — E1122 Type 2 diabetes mellitus with diabetic chronic kidney disease: Secondary | ICD-10-CM

## 2023-04-07 DIAGNOSIS — N1832 Chronic kidney disease, stage 3b: Secondary | ICD-10-CM

## 2023-04-07 LAB — CBC WITH DIFFERENTIAL/PLATELET
Absolute Monocytes: 1071 cells/uL — ABNORMAL HIGH (ref 200–950)
Basophils Absolute: 65 cells/uL (ref 0–200)
Basophils Relative: 0.5 %
Eosinophils Absolute: 503 cells/uL — ABNORMAL HIGH (ref 15–500)
Eosinophils Relative: 3.9 %
HCT: 38.7 % (ref 35.0–45.0)
Hemoglobin: 12.9 g/dL (ref 11.7–15.5)
Lymphs Abs: 2748 cells/uL (ref 850–3900)
MCH: 28.6 pg (ref 27.0–33.0)
MCHC: 33.3 g/dL (ref 32.0–36.0)
MCV: 85.8 fL (ref 80.0–100.0)
MPV: 11.1 fL (ref 7.5–12.5)
Monocytes Relative: 8.3 %
Neutro Abs: 8514 cells/uL — ABNORMAL HIGH (ref 1500–7800)
Neutrophils Relative %: 66 %
Platelets: 419 10*3/uL — ABNORMAL HIGH (ref 140–400)
RBC: 4.51 10*6/uL (ref 3.80–5.10)
RDW: 12.6 % (ref 11.0–15.0)
Total Lymphocyte: 21.3 %
WBC: 12.9 10*3/uL — ABNORMAL HIGH (ref 3.8–10.8)

## 2023-04-07 NOTE — Progress Notes (Signed)
Future Appointments  Date Time Provider Department  04/07/2023                 6 mo ov   3:30 PM Lucky Cowboy, MD [facility]  09/01/2023               cpe  3:00 PM Lucky Cowboy, MD [facility]  12/02/2023                 wellness  3:00 PM Adela Glimpse, NP [facility]    History of Present Illness:      This very nice 89 y.o. WWF presents for  6  month follow up with HTN, HLD, Insulin requiring T2 _DM, Hypothyroidism, Vascular Dementia   and Vitamin D Deficiency. Patient resides with  Orvilla Fus - her caregiver son  who supervises her meds and administers /regulates her Insulin.        Patient is treated for HTN (1994)  & BP has been controlled at home. Today's BP is at goal -  130/70 . Patient has had no complaints of any cardiac type chest pain, palpitations, dyspnea/orthopnea/PND, dizziness, claudication, or dependent edema.       Hyperlipidemia is controlled with diet & meds. Patient denies myalgias or other med SE's. Last Lipids were at goal except elevated Trig's :  Lab Results  Component Value Date   CHOL 152 08/27/2022   HDL 62 08/27/2022   LDLCALC 66 08/27/2022   TRIG 165 (H) 08/27/2022   CHOLHDL 2.5 08/27/2022     Also, the patient has history of   Insulin Requiring T2_DM (2003) with CKD3b  (GFR 34).  Patient's son Orvilla Fus monitors her CBG's 2 x /day and covers her with SS Novolin 70/30.   She has had no symptoms of reactive hypoglycemia, diabetic polys, paresthesias or visual blurring.  Last A1c was not at goal:   Lab Results  Component Value Date   HGBA1C 7.6 (H) 08/27/2022                                               Patient has  been on Thyroid Replacement since she had Goiter  surgery in the 1970's.        Further, the patient also has history of Vitamin D Deficiency and supplements vitamin D without any suspected side-effects. Last vitamin D was at goal :  Lab Results  Component Value Date   VD25OH 55 08/27/2022       Current Outpatient  Medications:    ACCU-CHEK SOFTCLIX LANCETS lancets, CHECK BLOOD SUGER UP TO 3 TIMES A DAY, Disp: 100 each, Rfl: 0   aspirin (SB LOW DOSE ASA EC) 81 MG EC tablet, Take      1 tablet        Daily, Disp: 1 tablet, Rfl: 0   DULoxetine (CYMBALTA) 30 MG capsule, TAKE 1 CAPSULE ONCE DAILY FOR MOOD, Disp: 90 capsule, Rfl: 3   famotidine (PEPCID) 20 MG tablet, Take 20 mg by mouth daily., Disp: , Rfl:    furosemide (LASIX) 40 MG tablet, TAKE ONE TABLET TWICE DAILY FOR FLUID RETENTION/ANKLE SWELLING, Disp: 180 tablet, Rfl: 3   GNP ULTICARE PEN NEEDLES 32G X 4 MM MISC, AS DIRECTED DAILY, Disp: 100 each, Rfl: 3   insulin glargine (LANTUS SOLOSTAR) 100 UNIT/ML Solostar Pen, INJECT 20 TO 34 UNITS NIGHTLY TO REACH FASTING  GLUCOSE LESS THAN 130, Disp: 15 mL, Rfl: 3   levothyroxine (SYNTHROID) 100 MCG tablet, TAKE (1) TABLET DAILY BE- FORE BREAKFAST., Disp: 90 tablet, Rfl: 3   losartan (COZAAR) 100 MG tablet, TAKE 1 TABLET ONCE A DAY FOR HIGH BLOOD PRESSURE, Disp: 90 tablet, Rfl: 3   metFORMIN (GLUCOPHAGE) 1000 MG tablet, TAKE (1) TABLET TWICE A DAY WITH MEALS (BREAKFAST AND SUPPER), Disp: 180 tablet, Rfl: 3   Polyethylene Glycol 3350 (MIRALAX PO), Take 1 Scoop by mouth daily., Disp: , Rfl:    rosuvastatin (CRESTOR) 5 MG tablet, TAKE ONE TABLET ONCE DAILY FOR CHOLESTEROL, Disp: 90 tablet, Rfl: 3   TRULICITY 1.5 MG/0.5ML SOPN, INJECT 1.5MG  ONCE WEEKLY, Disp: 6 mL, Rfl: 3   vitamin B-12 (CYANOCOBALAMIN) 1000 MCG tablet, Take 1,000 mcg by mouth daily., Disp: , Rfl:     Allergies  Allergen Reactions   Detrol [Tolterodine]     PMHx:   Past Medical History:  Diagnosis Date   Arthritis    GERD (gastroesophageal reflux disease)    HOH (hard of hearing)    Hyperlipidemia    Hypertension    Hypothyroid    IDDM (insulin dependent diabetes mellitus)    Leg swelling    Macular degeneration    Shortness of breath dyspnea    with exertion   Sleep apnea    does not wear CPAP   Varicose veins    Wears glasses       Immunization History  Administered Date(s) Administered   DT  10/20/2014   Influenza Split 06/04/2017   Influenza, High Dose  07/01/2016, 05/29/2018   Influenza,inj,quad 05/11/2017   Influenza 06/09/2019   Pneumococcal -13 04/05/2014   Pneumococcal-23 09/09/2004   Td 09/10/2003   Zoster, Live 09/09/2005     Past Surgical History:  Procedure Laterality Date   ANTERIOR CERVICAL DECOMP/DISCECTOMY FUSION N/A 11/08/2014   Procedure: Cervical three-four,  Cervical six-seven anterior cervical decompression with fusion plating and bonegraft;  Surgeon: Coletta Memos, MD;  Location: [facility];  Service: Neurosurgery;  Laterality: N/A;  Cervical three-four,  Cervical six-seven anterior cervical decompression with fusion plating and bonegraft   BIOPSY THYROID     benign thyroid nodule removal   CATARACT EXTRACTION, BILATERAL     COLONOSCOPY W/ BIOPSIES AND POLYPECTOMY     ENDOMETRIAL BIOPSY  2000   SPINE SURGERY Left  11/08/2014   Left ACDF    TUBAL LIGATION     VARICOSE VEIN SURGERY      FHx:    Reviewed / unchanged  SHx:    Reviewed / unchanged   Systems Review:  Constitutional: Denies fever, chills, wt changes, headaches, insomnia, fatigue, night sweats, change in appetite. Eyes: Denies redness, blurred vision, diplopia, discharge, itchy, watery eyes.  ENT: Denies discharge, congestion, post nasal drip, epistaxis, sore throat, earache, hearing loss, dental pain, tinnitus, vertigo, sinus pain, snoring.  CV: Denies chest pain, palpitations, irregular heartbeat, syncope, dyspnea, diaphoresis, orthopnea, PND, claudication or edema. Respiratory: denies cough, dyspnea, DOE, pleurisy, hoarseness, laryngitis, wheezing.  Gastrointestinal: Denies dysphagia, odynophagia, heartburn, reflux, water brash, abdominal pain or cramps, nausea, vomiting, bloating, diarrhea, constipation, hematemesis, melena, hematochezia  or hemorrhoids. Genitourinary: Denies dysuria, frequency, urgency,  nocturia, hesitancy, discharge, hematuria or flank pain. Musculoskeletal: Denies arthralgias, myalgias, stiffness, jt. swelling, pain, limping or strain/sprain.  Skin: Denies pruritus, rash, hives, warts, acne, eczema or change in skin lesion(s). Neuro: No weakness, tremor, incoordination, spasms, paresthesia or pain. Psychiatric: Denies confusion, memory loss or sensory loss. Endo: Denies change  in weight, skin or hair change.  Heme/Lymph: No excessive bleeding, bruising or enlarged lymph nodes.  Physical Exam  BP 130/70   Pulse 65   Temp 97.9 F (36.6 C)   Resp 17   Ht 5\' 2"  (1.575 m)   Wt 170 lb 9.6 oz (77.4 kg)   SpO2 99%   BMI 31.20 kg/m   Appears  over  nourished  and in no distress.  Eyes: PERRLA, EOMs, conjunctiva no swelling or erythema. Sinuses: No frontal/maxillary tenderness ENT/Mouth: EAC's clear, TM's nl w/o erythema, bulging. Nares clear w/o erythema, swelling, exudates. Oropharynx clear without erythema or exudates. Oral hygiene is good. Tongue normal, non obstructing. Hearing intact.  Neck: Supple. Thyroid not palpable. Car 2+/2+ without bruits, nodes or JVD. Chest: Respirations nl with BS clear & equal w/o rales, rhonchi, wheezing or stridor.  Cor: Heart sounds normal w/ regular rate and rhythm without sig. murmurs, gallops, clicks or rubs. Peripheral pulses normal and equal  without edema.  Abdomen: Soft & bowel sounds normal. Non-tender w/o guarding, rebound, hernias, masses or organomegaly.  Lymphatics: Unremarkable.  Musculoskeletal: Full ROM all peripheral extremities, joint stability, 5/5 strength and normal gait.  Skin: Warm, dry without exposed rashes, lesions or ecchymosis apparent.  Neuro: Cranial nerves intact, reflexes equal bilaterally. Sensory-motor testing grossly intact. Tendon reflexes grossly intact.  Pysch: Alert & oriented x 3.  Insight and judgement nl & appropriate. No ideations.  Assessment and Plan:   1. Essential hypertension  -  Continue medication, monitor blood pressure at home.  - Continue DASH diet.  Reminder to go to the ER if any CP,  SOB, nausea, dizziness, severe HA, changes vision/speech.   - CBC with Differential/Platelet - COMPLETE METABOLIC PANEL WITH GFR - Magnesium - TSH  2. Hyperlipidemia associated with type 2 diabetes mellitus (HCC)  - Continue diet/meds, exercise,& lifestyle modifications.  - Continue monitor periodic cholesterol/liver & renal functions    - Lipid panel - TSH  3. Type 2 diabetes mellitus with stage 3b chronic kidney                                                     disease, with long-term current use of insulin (HCC)  - Continue diet, exercise  - Lifestyle modifications.  - Monitor appropriate labs   - Hemoglobin A1c  4. Poorly controlled type 2 diabetes mellitus (HCC)  - Hemoglobin A1c  5. Hypothyroidism  - TSH  6. Vitamin D deficiency  - Continue supplementation.   - VITAMIN D 25 Hydroxy  7. Medication management  - CBC with Differential/Platelet - COMPLETE METABOLIC PANEL WITH GFR - Magnesium - Lipid panel - TSH - Hemoglobin A1c - VITAMIN D 25 Hydroxy          Discussed  regular exercise, BP monitoring, weight control to achieve/maintain BMI less than 25 and discussed med and SE's. Recommended labs to assess and monitor clinical status with further disposition pending results of labs.  I discussed the assessment and treatment plan with the patient. The patient was provided an opportunity to ask questions and all were answered. The patient agreed with the plan and demonstrated an understanding of the instructions.  I provided over 30 minutes of exam, counseling, chart review and  complex critical decision making.        The patient was advised to  call back or seek an in-person evaluation if the symptoms worsen or if the condition fails to improve as anticipated.   Marinus Maw, MD

## 2023-04-07 NOTE — Patient Instructions (Signed)

## 2023-04-08 NOTE — Progress Notes (Signed)
^<^<^<^<^<^<^<^<^<^<^<^<^<^<^<^<^<^<^<^<^<^<^<^<^<^<^<^<^<^<^<^<^<^<^<^<^ ^>^>^>^>^>^>^>^>^>^>^>>^>^>^>^>^>^>^>^>^>^>^>^>^>^>^>^>^>^>^>^>^>^>^>^>^>  -  Test results slightly outside the reference range are not unusual. If there is anything important, I will review this with you,  otherwise it is considered normal test values.  If you have further questions,  please do not hesitate to contact me at the office or via My Chart.   ^<^<^<^<^<^<^<^<^<^<^<^<^<^<^<^<^<^<^<^<^<^<^<^<^<^<^<^<^<^<^<^<^<^<^<^<^ ^>^>^>^>^>^>^>^>^>^>^>^>^>^>^>^>^>^>^>^>^>^>^>^>^>^>^>^>^>^>^>^>^>^>^>^>^  -  Magnesium  =  1.3     - is  very  low - goal is betw 2.0 - 2.5,   - So......Marland Kitchen     Recommend that you take  Magnesium 500 mg tablet 3 x /day with Meals   - also important to eat lots of  leafy green vegetables   - spinach - Kale - collards - greens - okra - asparagus  - broccoli - quinoa - squash - almonds   - black, red, white beans  -  peas - green beans ^>^>^>^>^>^>^>^>^>^>^>^>^>^>^>^>^>^>^>^>^>^>^>^>^>^>^>^>^>^>^>^>^>^>^>^>^ ^>^>^>^>^>^>^>^>^>^>^>^>^>^>^>^>^>^>^>^>^>^>^>^>^>^>^>^>^>^>^>^>^>^>^>^>^  -   Chol = 135 -  Excellent  - Please continue Rosuvastatin Same   - Very low risk for Heart Attack  / Stroke  ^>^>^>^>^>^>^>^>^>^>^>^>^>^>^>^>^>^>^>^>^>^>^>^>^>^>^>^>^>^>^>^>^>^>^>^>^ ^>^>^>^>^>^>^>^>^>^>^>^>^>^>^>^>^>^>^>^>^>^>^>^>^>^>^>^>^>^>^>^>^>^>^>^>^  -  Vitamin D = 97 - Excellent  -  Please continue dosage same   ^>^>^>^>^>^>^>^>^>^>^>^>^>^>^>^>^>^>^>^>^>^>^>^>^>^>^>^>^>^>^>^>^>^>^>^>^ ^>^>^>^>^>^>^>^>^>^>^>^>^>^>^>^>^>^>^>^>^>^>^>^>^>^>^>^>^>^>^>^>^>^>^>^>^  -   All Else - CBC  - Electrolytes - Liver - Magnesium & Thyroid    -   All  Normal / OK ^>^>^>^>^>^>^>^>^>^>^>^>^>^>^>^>^>^>^>^>^>^>^>^>^>^>^>^>^>^>^>^>^>^>^>^>^ ^>^>^>^>^>^>^>^>^>^>^>^>^>^>^>^>^>^>^>^>^>^>^>^>^>^>^>^>^>^>^>^>^>^>^>^>^

## 2023-04-13 NOTE — Progress Notes (Signed)
^<^<^<^<^<^<^<^<^<^<^<^<^<^<^<^<^<^<^<^<^<^<^<^<^<^<^<^<^<^<^<^<^<^<^<^<^ ^>^>^>^>^>^>^>^>^>^>^>>^>^>^>^>^>^>^>^>^>^>^>^>^>^>^>^>^>^>^>^>^>^>^>^>^>   A1c finally returned & is way too high    A1c = 7.9%   Need stricter diet & weight loss   ^<^<^<^<^<^<^<^<^<^<^<^<^<^<^<^<^<^<^<^<^<^<^<^<^<^<^<^<^<^<^<^<^<^<^<^<^ ^>^>^>^>^>^>^>^>^>^>^>^>^>^>^>^>^>^>^>^>^>^>^>^>^>^>^>^>^>^>^>^>^>^>^>^>^

## 2023-07-03 DIAGNOSIS — H353221 Exudative age-related macular degeneration, left eye, with active choroidal neovascularization: Secondary | ICD-10-CM | POA: Diagnosis not present

## 2023-07-08 ENCOUNTER — Ambulatory Visit (INDEPENDENT_AMBULATORY_CARE_PROVIDER_SITE_OTHER): Payer: Medicare Other | Admitting: Nurse Practitioner

## 2023-07-08 ENCOUNTER — Encounter: Payer: Self-pay | Admitting: Nurse Practitioner

## 2023-07-08 VITALS — BP 120/60 | HR 69 | Temp 97.9°F | Ht 62.0 in | Wt 169.8 lb

## 2023-07-08 DIAGNOSIS — I10 Essential (primary) hypertension: Secondary | ICD-10-CM

## 2023-07-08 DIAGNOSIS — N183 Chronic kidney disease, stage 3 unspecified: Secondary | ICD-10-CM

## 2023-07-08 DIAGNOSIS — E559 Vitamin D deficiency, unspecified: Secondary | ICD-10-CM

## 2023-07-08 DIAGNOSIS — Z79899 Other long term (current) drug therapy: Secondary | ICD-10-CM

## 2023-07-08 DIAGNOSIS — F0150 Vascular dementia without behavioral disturbance: Secondary | ICD-10-CM

## 2023-07-08 DIAGNOSIS — H353231 Exudative age-related macular degeneration, bilateral, with active choroidal neovascularization: Secondary | ICD-10-CM

## 2023-07-08 DIAGNOSIS — E1142 Type 2 diabetes mellitus with diabetic polyneuropathy: Secondary | ICD-10-CM

## 2023-07-08 DIAGNOSIS — N1832 Chronic kidney disease, stage 3b: Secondary | ICD-10-CM

## 2023-07-08 DIAGNOSIS — E1122 Type 2 diabetes mellitus with diabetic chronic kidney disease: Secondary | ICD-10-CM

## 2023-07-08 DIAGNOSIS — Z6837 Body mass index (BMI) 37.0-37.9, adult: Secondary | ICD-10-CM

## 2023-07-08 DIAGNOSIS — E039 Hypothyroidism, unspecified: Secondary | ICD-10-CM

## 2023-07-08 DIAGNOSIS — E1169 Type 2 diabetes mellitus with other specified complication: Secondary | ICD-10-CM | POA: Diagnosis not present

## 2023-07-08 DIAGNOSIS — E785 Hyperlipidemia, unspecified: Secondary | ICD-10-CM

## 2023-07-08 DIAGNOSIS — Z794 Long term (current) use of insulin: Secondary | ICD-10-CM

## 2023-07-08 DIAGNOSIS — E66812 Obesity, class 2: Secondary | ICD-10-CM

## 2023-07-08 DIAGNOSIS — M51369 Other intervertebral disc degeneration, lumbar region without mention of lumbar back pain or lower extremity pain: Secondary | ICD-10-CM | POA: Diagnosis not present

## 2023-07-08 NOTE — Progress Notes (Signed)
FOLLOW UP Assessment:   Catherine Wood was seen today for a follow up  Diagnoses and all orders for this visit:  Essential hypertension Discussed DASH (Dietary Approaches to Stop Hypertension) DASH diet is lower in sodium than a typical American diet. Cut back on foods that are high in saturated fat, cholesterol, and trans fats. Eat more whole-grain foods, fish, poultry, and nuts Remain active and exercise as tolerated daily.  Monitor BP at home-Call if greater than 130/80.  Check CMP/CBC  Mixed hyperlipidemia Continue rosuvastatin Discussed lifestyle modifications. Recommended diet heavy in fruits and veggies, omega 3's. Decrease consumption of animal meats, cheeses, and dairy products. Remain active and exercise as tolerated. Continue to monitor. Check lipids/TSH  CKD stage 3 due to type 2 diabetes mellitus (HCC) Stay well hydrated. Avoid high salt foods. Avoid NSAIDS. Keep BP and BG well controlled.   Take medications as prescribed. Remain active and exercise as tolerated daily. Maintain weight.  Continue to monitor. Check CMP/GFR/Microablumin   Hypothyroidism, unspecified type Controlled. Continue Levothyroxine. Reminded to take on an empty stomach 30-41mins before food.  Stop any Biotin Supplement 48-72 hours before next TSH level to reduce the risk of falsely low TSH levels. Continue to monitor.    Diabetic polyneuropathy associated with type 2 diabetes mellitus (HCC) Taking Duloxetine 30mg  Discussed checking feet daily and well fitting shoes Follows with podiatry   DDD (degenerative disc disease), lumbar Managing at this time Not taking any medications for this  Type 2 diabetes mellitus with stage 3 chronic kidney disease, with long-term current use of insulin (HCC) Continue Lantus Trulicity no longer covered - donut hole Education: Reviewed 'ABCs' of diabetes management  Discussed goals to be met and/or maintained include A1C (<7) Blood pressure  (<130/80) Cholesterol (LDL <70) Continue Eye Exam yearly  Continue Dental Exam Q6 mo Discussed dietary recommendations Discussed Physical Activity recommendations Check A1C  Obesity - BMI 34 Discussed appropriate BMI Diet modification. Physical activity. Encouraged/praised to build confidence.  Macular degeneration, bil, exudative with neovascularization (HCC) Follows closely with Dr. Luciana Axe  Vitamin D deficiency Continue supplementation Check levels annually and as needed   Medication management All medications discussed and reviewed in full. All questions and concerns regarding medications addressed.    Vascular dementia without behavioral disturbance [facility]) Son assists; declines neuro referral  No needs at this time; son/daughter are monitoring closely  Control blood pressure, cholesterol, glucose, increase exercise.  Continue statin, ASA  Orders Placed This Encounter  Procedures   CBC with Differential/Platelet   COMPLETE METABOLIC PANEL WITH GFR   Lipid panel   Hemoglobin A1c   TSH   Notify office for further evaluation and treatment, questions or concerns if any reported s/s fail to improve.   The patient was advised to call back or seek an in-person evaluation if any symptoms worsen or if the condition fails to improve as anticipated.   Further disposition pending results of labs. Discussed med's effects and SE's.    I discussed the assessment and treatment plan with the patient. The patient was provided an opportunity to ask questions and all were answered. The patient agreed with the plan and demonstrated an understanding of the instructions.  Discussed med's effects and SE's. Screening labs and tests as requested with regular follow-up as recommended.  I provided 30 minutes of face-to-face time during this encounter including counseling, chart review, and critical decision making was preformed.  Today's Plan of Care is based on a patient-centered health  care approach known as shared decision  making - the decisions, tests and treatments allow for patient preferences and values to be balanced with clinical evidence.    Future Appointments  Date Time Provider Department Center  10/09/2023  3:00 PM Lucky Cowboy, MD [facility] None  12/02/2023  3:00 PM Adela Glimpse, NP [facility] None    Subjective:  Catherine Wood is a 89 y.o. female for a general 3 month follow up for HTN, hyperlipidemia, DMII, polyneuropathy, hypothyroidism and vitamin D Def.   She is accompanied by her son, who has been taking care of medications, lives next to her and checks on her every 1-2 hours. Daughter also checks in on her regularly.  She has dementia;   MRI from 10/2014 showed atrophy and small vessel disease. Has declined neuro referral. Walks with rolling walker, no falls. Driven by family, they manage meds/finances. She wears pull ups for occasional urine leaks, able to toilet by herself. Has bars in bathroom, bathes and dresses with assistance. Son reports she tends to want to stay in bed and sleep all day unless they actively engage.  Pleasant, no safety concerns, no wandering.   Very poor vision secondary to MD, Dr. Luciana Axe follows closely.   She has neuropathy and takes cymbalta 30 mg daily, has tapered off of gabapentin. Has not c/o pain, denies today.   BMI is Body mass index is 31.06 kg/m., she has not been working on diet/exercise - lots of snacking. Wt Readings from Last 3 Encounters:  07/08/23 169 lb 12.8 oz (77 kg)  04/07/23 170 lb 9.6 oz (77.4 kg)  08/27/22 170 lb 9.6 oz (77.4 kg)   Her blood pressure has been controlled at home (110s/70s), today their BP is BP: 120/60. Currently taking furosemide 40 mg BID, losartan 100 mg daily.   She does workout. She denies chest pain, shortness of breath, dizziness.  She is on cholesterol medication (rosuvastatin 5 mg daily) and denies myalgias. Her cholesterol is not at goal. The cholesterol last visit  was:   Lab Results  Component Value Date   CHOL 135 04/07/2023   HDL 55 04/07/2023   LDLCALC 54 04/07/2023   TRIG 193 (H) 04/07/2023   CHOLHDL 2.5 04/07/2023   She has been working on diet and exercise for DMII with CKD IIIb, neuropathy.  Currently on Lantus.  No longer on Trulicity d/t no insurance coverage. Denies hypoglycemia , increased appetite, nausea, paresthesia of the feet, polydipsia, polyuria, and vomiting.  Last A1C in the office was:  Lab Results  Component Value Date   HGBA1C 7.9 (H) 04/07/2023   She has CKD III,  associated with T2DM and htn, on losartan. Last GFR:  Lab Results  Component Value Date   EGFR 54 (L) 04/07/2023   Lab Results  Component Value Date   MICRALBCREAT 136 (H) 08/27/2022   MICRALBCREAT 120 (H) 08/20/2021   MICRALBCREAT 18 08/07/2020   Patient is on Vitamin D supplement, taking 5000 IU daily    Lab Results  Component Value Date   VD25OH 97 04/07/2023     She is on thyroid medication. Her medication was not changed last visit.  She is taking daily. Lab Results  Component Value Date   TSH 3.50 04/07/2023  .  Medication Review:  Current Outpatient Medications (Endocrine & Metabolic):    insulin glargine (LANTUS SOLOSTAR) 100 UNIT/ML Solostar Pen, INJECT 20 TO 34 UNITS NIGHTLY TO REACH FASTING GLUCOSE LESS THAN 130   levothyroxine (SYNTHROID) 100 MCG tablet, TAKE (1) TABLET DAILY BE- FORE  BREAKFAST.   metFORMIN (GLUCOPHAGE) 1000 MG tablet, TAKE (1) TABLET TWICE A DAY WITH MEALS (BREAKFAST AND SUPPER)   TRULICITY 1.5 MG/0.5ML SOPN, INJECT 1.5MG  ONCE WEEKLY (Patient not taking: Reported on 07/08/2023)  Current Outpatient Medications (Cardiovascular):    furosemide (LASIX) 40 MG tablet, TAKE ONE TABLET TWICE DAILY FOR FLUID RETENTION/ANKLE SWELLING   losartan (COZAAR) 100 MG tablet, TAKE 1 TABLET ONCE A DAY FOR HIGH BLOOD PRESSURE   rosuvastatin (CRESTOR) 5 MG tablet, TAKE ONE TABLET ONCE DAILY FOR CHOLESTEROL   Current Outpatient  Medications (Analgesics):    aspirin (SB LOW DOSE ASA EC) 81 MG EC tablet, Take      1 tablet        Daily  Current Outpatient Medications (Hematological):    vitamin B-12 (CYANOCOBALAMIN) 1000 MCG tablet, Take 1,000 mcg by mouth daily.  Current Outpatient Medications (Other):    ACCU-CHEK SOFTCLIX LANCETS lancets, CHECK BLOOD SUGER UP TO 3 TIMES A DAY   DULoxetine (CYMBALTA) 30 MG capsule, TAKE 1 CAPSULE ONCE DAILY FOR MOOD   famotidine (PEPCID) 20 MG tablet, Take 20 mg by mouth daily.   GNP ULTICARE PEN NEEDLES 32G X 4 MM MISC, AS DIRECTED DAILY   Polyethylene Glycol 3350 (MIRALAX PO), Take 1 Scoop by mouth daily.  Allergies: Allergies  Allergen Reactions   Detrol [Tolterodine]     Current Problems (verified) has Essential hypertension; Hypothyroidism; CKD stage 3 due to type 2 diabetes mellitus (HCC); Hyperlipidemia associated with type 2 diabetes mellitus (HCC); Macular degeneration; Vitamin D deficiency; Chronic venous insufficiency; Obesity (BMI 30.0-34.9); DDD (degenerative disc disease), lumbar; Cervical spondylosis with myelopathy; Diabetic polyneuropathy associated with type 2 diabetes mellitus (HCC); Type 2 diabetes mellitus with stage 3b chronic kidney disease, with long-term current use of insulin (HCC); Vascular dementia without behavioral disturbance (HCC); Exudative age-related macular degeneration of left eye with active choroidal neovascularization (HCC); Retinal hemorrhage of left eye; Lamellar macular hole of right eye; Dry eyes; Advanced nonexudative age-related macular degeneration of both eyes with subfoveal involvement; Posterior vitreous detachment of left eye; Osteopenia of multiple sites; and Exudative age-related macular degeneration of right eye with active choroidal neovascularization (HCC) on their problem list.  Screening Tests Immunization History  Administered Date(s) Administered   DT (Pediatric) 10/20/2014   Influenza Split 06/04/2017   Influenza, High  Dose Seasonal PF 07/01/2016, 05/29/2018, 08/20/2021   Influenza,inj,quad, With Preservative 05/11/2017   Influenza-Unspecified 06/22/2013, 06/16/2014, 06/27/2014, 07/06/2015, 06/09/2019   Moderna Sars-Covid-2 Vaccination 09/21/2019, 11/01/2019   Pneumococcal Conjugate-13 04/05/2014   Pneumococcal-Unspecified 09/09/2004   Td 09/10/2003   Zoster, Live 09/09/2005   Health Maintenance  Topic Date Due   Zoster Vaccines- Shingrix (1 of 2) 09/13/1949   COVID-19 Vaccine (3 - Moderna risk series) 11/29/2019   OPHTHALMOLOGY EXAM  10/01/2022   Medicare Annual Wellness (AWV)  12/07/2022   INFLUENZA VACCINE  04/10/2023   FOOT EXAM  08/27/2023   HEMOGLOBIN A1C  10/08/2023   DTaP/Tdap/Td (3 - Tdap) 10/20/2024   DEXA SCAN  Completed   HPV VACCINES  Aged Out   Pneumonia Vaccine 42+ Years old  Discontinued   Patient Care Team: Lucky Cowboy, MD as PCP - General (Internal Medicine) Vida Rigger, MD as Consulting Physician (Gastroenterology) Lewayne Bunting, MD as Consulting Physician (Cardiology) Mateo Flow, MD as Consulting Physician (Ophthalmology) Luciana Axe Alford Highland, MD as Consulting Physician (Ophthalmology) Waymon Budge, MD as Consulting Physician (Pulmonary Disease) Carmelina Dane, [facility] (Inactive) as Pharmacist (Pharmacist)  Surgical: She  has a past surgical history that  includes Biopsy thyroid; Varicose vein surgery; Cataract extraction, bilateral; Endometrial biopsy (2000); Colonoscopy w/ biopsies and polypectomy; Tubal ligation; Anterior cervical decomp/discectomy fusion (N/A, 11/08/2014); and Spine surgery (Left,  11/08/2014). Family Her family history includes Cancer in her father and mother. Social history  She reports that she has never smoked. She has never used smokeless tobacco. She reports that she does not drink alcohol and does not use drugs.  Objective:   Today's Vitals   07/08/23 1507  BP: 120/60  Pulse: 69  Temp: 97.9 F (36.6 C)  SpO2: 99%  Weight: 169 lb  12.8 oz (77 kg)  Height: 5\' 2"  (1.575 m)     Body mass index is 31.06 kg/m.  General Appearance: Well nourished, in no apparent distress. Eyes: PERRLA, EOMs, conjunctiva no swelling or erythema Sinuses: No Frontal/maxillary tenderness ENT/Mouth: Ext aud canals clear, TMs without erythema, bulging. No erythema, swelling, or exudate on post pharynx.  Tonsils not swollen or erythematous. Mildly HOH.  Neck: Supple, thyroid normal.  Respiratory: Respiratory effort normal, BS equal bilaterally without rales, rhonchi, wheezing or stridor.  Cardio: RRR with no MRGs. Brisk peripheral pulses without edema.  Abdomen: Soft, + BS.  Non tender, no guarding, rebound, hernias, masses. Lymphatics: Non tender without lymphadenopathy.  Musculoskeletal: Full ROM, 5/5 strength, slow steady gait with rolling walker Skin: Warm, dry without rashes, lesions, ecchymosis.  Neuro: No cerebellar symptoms. Very poor short term recall.  Psych: Awake and oriented X 2, normal affect, Insight and Judgment poor  Adela Glimpse, NP 5:03 PM  [facility]

## 2023-07-08 NOTE — Patient Instructions (Signed)

## 2023-07-09 LAB — LIPID PANEL
Cholesterol: 158 mg/dL (ref <200)
HDL: 57 mg/dL (ref >=50)
LDL Cholesterol (Calc): 72 mg/dL
Non-HDL Cholesterol (Calc): 101 mg/dL (ref <130)
Total CHOL/HDL Ratio: 2.8 (calc) (ref <5.0)
Triglycerides: 194 mg/dL — ABNORMAL HIGH (ref <150)

## 2023-07-09 LAB — COMPLETE METABOLIC PANEL WITH GFR
AG Ratio: 1.3 (calc) (ref 1.0–2.5)
ALT: 12 U/L (ref 6–29)
AST: 16 U/L (ref 10–35)
Albumin: 3.9 g/dL (ref 3.6–5.1)
Alkaline phosphatase (APISO): 60 U/L (ref 37–153)
BUN/Creatinine Ratio: 23 (calc) — ABNORMAL HIGH (ref 6–22)
BUN: 26 mg/dL — ABNORMAL HIGH (ref 7–25)
CO2: 32 mmol/L (ref 20–32)
Calcium: 10 mg/dL (ref 8.6–10.4)
Chloride: 97 mmol/L — ABNORMAL LOW (ref 98–110)
Creat: 1.15 mg/dL — ABNORMAL HIGH (ref 0.60–0.95)
Globulin: 3.1 g/dL (ref 1.9–3.7)
Glucose, Bld: 145 mg/dL — ABNORMAL HIGH (ref 65–99)
Potassium: 4.9 mmol/L (ref 3.5–5.3)
Sodium: 140 mmol/L (ref 135–146)
Total Bilirubin: 0.3 mg/dL (ref 0.2–1.2)
Total Protein: 7 g/dL (ref 6.1–8.1)
eGFR: 45 mL/min/{1.73_m2} — ABNORMAL LOW (ref >=60)

## 2023-07-09 LAB — HEMOGLOBIN A1C
Hgb A1c MFr Bld: 8.2 %{Hb} — ABNORMAL HIGH (ref <5.7)
Mean Plasma Glucose: 189 mg/dL
eAG (mmol/L): 10.4 mmol/L

## 2023-07-09 LAB — CBC WITH DIFFERENTIAL/PLATELET
Absolute Lymphocytes: 2650 {cells}/uL (ref 850–3900)
Absolute Monocytes: 1013 {cells}/uL — ABNORMAL HIGH (ref 200–950)
Basophils Absolute: 63 {cells}/uL (ref 0–200)
Basophils Relative: 0.5 %
Eosinophils Absolute: 363 {cells}/uL (ref 15–500)
Eosinophils Relative: 2.9 %
HCT: 39.4 % (ref 35.0–45.0)
Hemoglobin: 12.7 g/dL (ref 11.7–15.5)
MCH: 28.3 pg (ref 27.0–33.0)
MCHC: 32.2 g/dL (ref 32.0–36.0)
MCV: 87.9 fL (ref 80.0–100.0)
MPV: 11.1 fL (ref 7.5–12.5)
Monocytes Relative: 8.1 %
Neutro Abs: 8413 {cells}/uL — ABNORMAL HIGH (ref 1500–7800)
Neutrophils Relative %: 67.3 %
Platelets: 454 10*3/uL — ABNORMAL HIGH (ref 140–400)
RBC: 4.48 10*6/uL (ref 3.80–5.10)
RDW: 12.4 % (ref 11.0–15.0)
Total Lymphocyte: 21.2 %
WBC: 12.5 10*3/uL — ABNORMAL HIGH (ref 3.8–10.8)

## 2023-07-09 LAB — TSH: TSH: 2.31 m[IU]/L (ref 0.40–4.50)

## 2023-08-05 ENCOUNTER — Other Ambulatory Visit: Payer: Self-pay | Admitting: Nurse Practitioner

## 2023-09-01 ENCOUNTER — Encounter: Payer: Medicare Other | Admitting: Internal Medicine

## 2023-09-02 ENCOUNTER — Encounter: Payer: Medicare Other | Admitting: Internal Medicine

## 2023-09-08 ENCOUNTER — Other Ambulatory Visit: Payer: Self-pay | Admitting: Nurse Practitioner

## 2023-09-08 DIAGNOSIS — Z794 Long term (current) use of insulin: Secondary | ICD-10-CM

## 2023-09-11 ENCOUNTER — Encounter: Payer: Self-pay | Admitting: Nurse Practitioner

## 2023-09-11 ENCOUNTER — Ambulatory Visit: Payer: Medicare Other | Admitting: Nurse Practitioner

## 2023-09-11 ENCOUNTER — Other Ambulatory Visit: Payer: Self-pay | Admitting: Nurse Practitioner

## 2023-09-11 VITALS — BP 148/63 | HR 70 | Temp 98.0°F

## 2023-09-11 DIAGNOSIS — U071 COVID-19: Secondary | ICD-10-CM

## 2023-09-11 DIAGNOSIS — E1122 Type 2 diabetes mellitus with diabetic chronic kidney disease: Secondary | ICD-10-CM

## 2023-09-11 MED ORDER — LANTUS SOLOSTAR 100 UNIT/ML ~~LOC~~ SOPN: PEN_INJECTOR | SUBCUTANEOUS | 3 refills | Status: DC

## 2023-09-11 MED ORDER — DOXYCYCLINE HYCLATE 100 MG PO TABS: 100.0000 mg | ORAL_TABLET | Freq: Two times a day (BID) | ORAL | 0 refills | Status: AC

## 2023-09-11 NOTE — Patient Instructions (Signed)

## 2023-09-11 NOTE — Progress Notes (Signed)
 THIS ENCOUNTER IS A VIRTUAL VISIT DUE TO COVID-19 - PATIENT WAS NOT SEEN IN THE OFFICE.  PATIENT HAS CONSENTED TO VIRTUAL VISIT / TELEMEDICINE VISIT   Virtual Visit via telephone Note  I connected with  Ronal Jenkins Mealing on 09/11/2023 by telephone.  I verified that I am speaking with the correct person using two identifiers.    I discussed the limitations of evaluation and management by telemedicine and the availability of in person appointments. The patient expressed understanding and agreed to proceed.  History of Present Illness:  BP (!) 148/63   Pulse 70   Temp 98 F (36.7 C)   SpO2 95%  89 y.o. patient contacted office reporting URI sx nasal and chest congestion and cough. she tested positive by in home test. OV was conducted by telephone to minimize exposure. This patient has been vaccinated for covid 19, last 10/2019.  Sx began 6 days ago with cough  Treatments tried so far: DayQuil and NyQuil  Exposures: Family gathering   Medications  Current Outpatient Medications (Endocrine & Metabolic):    LANTUS  SOLOSTAR 100 UNIT/ML Solostar Pen, INJECT 20 TO 34 UNITS NIGHTLY TO REACH FASTING GLUCOSE LESS THAN 130   levothyroxine  (SYNTHROID ) 100 MCG tablet, TAKE (1) TABLET DAILY BE- FORE BREAKFAST.   metFORMIN  (GLUCOPHAGE ) 1000 MG tablet, TAKE (1) TABLET TWICE A DAY WITH MEALS (BREAKFAST AND SUPPER)   TRULICITY  1.5 MG/0.5ML SOPN, INJECT 1.5MG  ONCE WEEKLY (Patient not taking: Reported on 09/11/2023)  Current Outpatient Medications (Cardiovascular):    furosemide  (LASIX ) 40 MG tablet, TAKE ONE TABLET TWICE DAILY FOR FLUID RETENTION/ANKLE SWELLING   losartan  (COZAAR ) 100 MG tablet, TAKE 1 TABLET ONCE A DAY FOR HIGH BLOOD PRESSURE   rosuvastatin  (CRESTOR ) 5 MG tablet, TAKE ONE TABLET ONCE DAILY FOR CHOLESTEROL   Current Outpatient Medications (Analgesics):    aspirin  (SB LOW DOSE ASA EC) 81 MG EC tablet, Take      1 tablet        Daily  Current Outpatient Medications (Hematological):     vitamin B-12 (CYANOCOBALAMIN) 1000 MCG tablet, Take 1,000 mcg by mouth daily.  Current Outpatient Medications (Other):    ACCU-CHEK SOFTCLIX LANCETS lancets, CHECK BLOOD SUGER UP TO 3 TIMES A DAY   DULoxetine  (CYMBALTA ) 30 MG capsule, TAKE 1 CAPSULE ONCE DAILY FOR MOOD   famotidine  (PEPCID ) 20 MG tablet, Take 20 mg by mouth daily.   GNP ULTICARE PEN NEEDLES 32G X 4 MM MISC, AS DIRECTED DAILY   Polyethylene Glycol 3350  (MIRALAX  PO), Take 1 Scoop by mouth daily.  Allergies:  Allergies  Allergen Reactions   Detrol [Tolterodine]     Problem list She has Essential hypertension; Hypothyroidism; CKD stage 3 due to type 2 diabetes mellitus (HCC); Hyperlipidemia associated with type 2 diabetes mellitus (HCC); Macular degeneration; Vitamin D  deficiency; Chronic venous insufficiency; Obesity (BMI 30.0-34.9); DDD (degenerative disc disease), lumbar; Cervical spondylosis with myelopathy; Diabetic polyneuropathy associated with type 2 diabetes mellitus (HCC); Type 2 diabetes mellitus with stage 3b chronic kidney disease, with long-term current use of insulin  (HCC); Vascular dementia without behavioral disturbance (HCC); Exudative age-related macular degeneration of left eye with active choroidal neovascularization (HCC); Retinal hemorrhage of left eye; Lamellar macular hole of right eye; Dry eyes; Advanced nonexudative age-related macular degeneration of both eyes with subfoveal involvement; Posterior vitreous detachment of left eye; Osteopenia of multiple sites; and Exudative age-related macular degeneration of right eye with active choroidal neovascularization (HCC) on their problem list.   Social History:   reports that she  has never smoked. She has never used smokeless tobacco. She reports that she does not drink alcohol and does not use drugs.  Observations/Objective:  General : Well sounding patient in no apparent distress HEENT: no hoarseness, no cough for duration of visit Lungs: speaks in  complete sentences, no audible wheezing, no apparent distress Neurological: alert, oriented x 3 Psychiatric: pleasant, judgement appropriate   Assessment and Plan:  Covid 19 Covid 19 positive per rapid screening test in home Risk factors include:  has Essential hypertension; Hypothyroidism; CKD stage 3 due to type 2 diabetes mellitus (HCC); Hyperlipidemia associated with type 2 diabetes mellitus (HCC); Macular degeneration; Vitamin D  deficiency; Chronic venous insufficiency; Obesity (BMI 30.0-34.9); DDD (degenerative disc disease), lumbar; Cervical spondylosis with myelopathy; Diabetic polyneuropathy associated with type 2 diabetes mellitus (HCC); Type 2 diabetes mellitus with stage 3b chronic kidney disease, with long-term current use of insulin  (HCC); Vascular dementia without behavioral disturbance (HCC); Exudative age-related macular degeneration of left eye with active choroidal neovascularization (HCC); Retinal hemorrhage of left eye; Lamellar macular hole of right eye; Dry eyes; Advanced nonexudative age-related macular degeneration of both eyes with subfoveal involvement; Posterior vitreous detachment of left eye; Osteopenia of multiple sites; and Exudative age-related macular degeneration of right eye with active choroidal neovascularization (HCC) on their problem list. Symptoms are: mild Due to co morbid conditions and risk factors, discussed antivirals  Immue support reviewed Vitamin D , Vitamin C, Zinc Take tylenol  PRN temp 101+ Push hydration Regular ambulation or calf exercises exercises for clot prevention and 81 mg ASA unless contraindicated Sx supportive therapy suggested Follow up via mychart or telephone if needed Advised patient obtain O2 monitor; present to ED if persistently <90% or with severe dyspnea, CP, fever uncontrolled by tylenol , confusion, sudden decline       Should remain in isolation 5 days from testing positive and then wear a mask when around other people for the  following 5 days  1. COVID-19 (Primary) Start tmt with Doxycycline  given age and length of symptoms - reduce risk for Covid PNA.   - doxycycline  (VIBRA -TABS) 100 MG tablet; Take 1 tablet (100 mg total) by mouth 2 (two) times daily for 7 days.  Dispense: 14 tablet; Refill: 0   Follow Up Instructions:  I discussed the assessment and treatment plan with the patient. The patient was provided an opportunity to ask questions and all were answered. The patient agreed with the plan and demonstrated an understanding of the instructions.   The patient was advised to call back or seek an in-person evaluation if the symptoms worsen or if the condition fails to improve as anticipated.  I provided 20 minutes of non-face-to-face time during this encounter.   BASCOM NECESSARY, NP

## 2023-10-03 DIAGNOSIS — H353221 Exudative age-related macular degeneration, left eye, with active choroidal neovascularization: Secondary | ICD-10-CM | POA: Diagnosis not present

## 2023-10-09 ENCOUNTER — Encounter: Payer: Medicare Other | Admitting: Internal Medicine

## 2023-10-29 ENCOUNTER — Encounter: Payer: Self-pay | Admitting: Family Medicine

## 2023-10-29 NOTE — Progress Notes (Deleted)
 Office Note 10/29/2023  CC: No chief complaint on file.   HPI:  Catherine Wood is a 89 y.o.  female who is here to establish care*** Patient's most recent primary MD: Dr. Lucky Cowboy. Old records in EPIC/HL were reviewed prior to or during today's visit.  ***  Past Medical History:  Diagnosis Date   Arthritis    GERD (gastroesophageal reflux disease)    HOH (hard of hearing)    Hyperlipidemia    Hypertension    Hypothyroid    IDDM (insulin dependent diabetes mellitus)    Leg swelling    Macular degeneration    Shortness of breath dyspnea    with exertion   Sleep apnea    does not wear CPAP   Varicose veins    Wears glasses     Past Surgical History:  Procedure Laterality Date   ANTERIOR CERVICAL DECOMP/DISCECTOMY FUSION N/A 11/08/2014   Procedure: Cervical three-four,  Cervical six-seven anterior cervical decompression with fusion plating and bonegraft;  Surgeon: Coletta Memos, MD;  Location: [facility];  Service: Neurosurgery;  Laterality: N/A;  Cervical three-four,  Cervical six-seven anterior cervical decompression with fusion plating and bonegraft   BIOPSY THYROID     benign thyroid nodule removal   CATARACT EXTRACTION, BILATERAL     COLONOSCOPY W/ BIOPSIES AND POLYPECTOMY     ENDOMETRIAL BIOPSY  2000   SPINE SURGERY Left  11/08/2014   Left ACDF    TUBAL LIGATION     VARICOSE VEIN SURGERY      Family History  Problem Relation Age of Onset   Cancer Mother        colon   Cancer Father        GI   Breast cancer Neg Hx     Social History   Socioeconomic History   Marital status: Single    Spouse name: Not on file   Number of children: Not on file   Years of education: Not on file   Highest education level: Not on file  Occupational History   Not on file  Tobacco Use   Smoking status: Never   Smokeless tobacco: Never  Substance and Sexual Activity   Alcohol use: No   Drug use: No   Sexual activity: Not on file  Other Topics Concern    Not on file  Social History Narrative   Not on file   Social Drivers of Health   Financial Resource Strain: Not on file  Food Insecurity: Not on file  Transportation Needs: Not on file  Physical Activity: Not on file  Stress: Not on file  Social Connections: Unknown (12/25/2022)   Received from [facility]   Social Network    Social Network: Not on file  Intimate Partner Violence: Not At Risk (12/25/2022)   Received from [facility], [facility]   HITS    Over the last 12 months how often did your partner physically hurt you?: Never    Over the last 12 months how often did your partner insult you or talk down to you?: Never    Over the last 12 months how often did your partner threaten you with physical harm?: Never    Over the last 12 months how often did your partner scream or curse at you?: Never    Outpatient Encounter Medications as of 10/30/2023  Medication Sig   ACCU-CHEK SOFTCLIX LANCETS lancets CHECK BLOOD SUGER UP TO 3 TIMES A DAY   aspirin (SB LOW DOSE ASA  EC) 81 MG EC tablet Take      1 tablet        Daily   DULoxetine (CYMBALTA) 30 MG capsule TAKE 1 CAPSULE ONCE DAILY FOR MOOD   famotidine (PEPCID) 20 MG tablet Take 20 mg by mouth daily.   furosemide (LASIX) 40 MG tablet TAKE ONE TABLET TWICE DAILY FOR FLUID RETENTION/ANKLE SWELLING   GNP ULTICARE PEN NEEDLES 32G X 4 MM MISC AS DIRECTED DAILY   insulin glargine (LANTUS SOLOSTAR) 100 UNIT/ML Solostar Pen INJECT 20 TO 34 UNITS NIGHTLY TO REACH FASTING GLUCOSE LESS THAN 130   levothyroxine (SYNTHROID) 100 MCG tablet TAKE (1) TABLET DAILY BE- FORE BREAKFAST.   losartan (COZAAR) 100 MG tablet TAKE 1 TABLET ONCE A DAY FOR HIGH BLOOD PRESSURE   metFORMIN (GLUCOPHAGE) 1000 MG tablet TAKE (1) TABLET TWICE A DAY WITH MEALS (BREAKFAST AND SUPPER)   Polyethylene Glycol 3350 (MIRALAX PO) Take 1 Scoop by mouth daily.   rosuvastatin (CRESTOR) 5 MG tablet TAKE ONE TABLET ONCE DAILY FOR CHOLESTEROL   TRULICITY 1.5 MG/0.5ML SOPN  INJECT 1.5MG  ONCE WEEKLY (Patient not taking: Reported on 09/11/2023)   vitamin B-12 (CYANOCOBALAMIN) 1000 MCG tablet Take 1,000 mcg by mouth daily.   No facility-administered encounter medications on file as of 10/30/2023.    Allergies  Allergen Reactions   Detrol [Tolterodine]     Review of Systems *** PE; There were no vitals taken for this visit. There is no height or weight on file to calculate BMI.  Physical Exam  *** Pertinent labs:  Last CBC Lab Results  Component Value Date   WBC 12.5 (H) 07/08/2023   HGB 12.7 07/08/2023   HCT 39.4 07/08/2023   MCV 87.9 07/08/2023   MCH 28.3 07/08/2023   RDW 12.4 07/08/2023   PLT 454 (H) 07/08/2023   Last metabolic panel Lab Results  Component Value Date   GLUCOSE 145 (H) 07/08/2023   NA 140 07/08/2023   K 4.9 07/08/2023   CL 97 (L) 07/08/2023   CO2 32 07/08/2023   BUN 26 (H) 07/08/2023   CREATININE 1.15 (H) 07/08/2023   EGFR 45 (L) 07/08/2023   CALCIUM 10.0 07/08/2023   PROT 7.0 07/08/2023   ALBUMIN 4.0 01/03/2017   BILITOT 0.3 07/08/2023   ALKPHOS 49 01/03/2017   AST 16 07/08/2023   ALT 12 07/08/2023   ANIONGAP 10 09/05/2016   Last lipids Lab Results  Component Value Date   CHOL 158 07/08/2023   HDL 57 07/08/2023   LDLCALC 72 07/08/2023   TRIG 194 (H) 07/08/2023   CHOLHDL 2.8 07/08/2023   Last hemoglobin A1c Lab Results  Component Value Date   HGBA1C 8.2 (H) 07/08/2023   Last thyroid functions Lab Results  Component Value Date   TSH 2.31 07/08/2023   Last vitamin D Lab Results  Component Value Date   VD25OH 97 04/07/2023   Last vitamin B12 and Folate Lab Results  Component Value Date   VITAMINB12 >2000 (H) 01/03/2017   ASSESSMENT AND PLAN:   No problem-specific Assessment & Plan notes found for this encounter.   An After Visit Summary was printed and given to the patient.  No follow-ups on file.

## 2023-10-30 ENCOUNTER — Ambulatory Visit: Payer: Medicare Other | Admitting: Family Medicine

## 2023-10-30 DIAGNOSIS — E119 Type 2 diabetes mellitus without complications: Secondary | ICD-10-CM

## 2023-10-30 DIAGNOSIS — Z794 Long term (current) use of insulin: Secondary | ICD-10-CM

## 2023-10-30 DIAGNOSIS — I10 Essential (primary) hypertension: Secondary | ICD-10-CM

## 2023-10-30 DIAGNOSIS — N1831 Chronic kidney disease, stage 3a: Secondary | ICD-10-CM

## 2023-10-30 DIAGNOSIS — D75839 Thrombocytosis, unspecified: Secondary | ICD-10-CM

## 2023-10-30 DIAGNOSIS — D72829 Elevated white blood cell count, unspecified: Secondary | ICD-10-CM

## 2023-12-02 ENCOUNTER — Ambulatory Visit: Payer: Medicare Other | Admitting: Nurse Practitioner

## 2023-12-03 ENCOUNTER — Ambulatory Visit: Payer: Medicare Other | Admitting: Family Medicine

## 2024-01-08 ENCOUNTER — Encounter: Payer: Self-pay | Admitting: Family Medicine

## 2024-01-08 ENCOUNTER — Ambulatory Visit (INDEPENDENT_AMBULATORY_CARE_PROVIDER_SITE_OTHER): Admitting: Family Medicine

## 2024-01-08 VITALS — BP 111/73 | HR 81 | Temp 98.6°F | Ht 62.0 in | Wt 172.8 lb

## 2024-01-08 DIAGNOSIS — D72829 Elevated white blood cell count, unspecified: Secondary | ICD-10-CM

## 2024-01-08 DIAGNOSIS — Z79899 Other long term (current) drug therapy: Secondary | ICD-10-CM

## 2024-01-08 DIAGNOSIS — E1121 Type 2 diabetes mellitus with diabetic nephropathy: Secondary | ICD-10-CM

## 2024-01-08 DIAGNOSIS — N1832 Chronic kidney disease, stage 3b: Secondary | ICD-10-CM

## 2024-01-08 DIAGNOSIS — I10 Essential (primary) hypertension: Secondary | ICD-10-CM

## 2024-01-08 DIAGNOSIS — Z794 Long term (current) use of insulin: Secondary | ICD-10-CM

## 2024-01-08 DIAGNOSIS — E1122 Type 2 diabetes mellitus with diabetic chronic kidney disease: Secondary | ICD-10-CM

## 2024-01-08 DIAGNOSIS — N1831 Chronic kidney disease, stage 3a: Secondary | ICD-10-CM | POA: Diagnosis not present

## 2024-01-08 DIAGNOSIS — E1169 Type 2 diabetes mellitus with other specified complication: Secondary | ICD-10-CM

## 2024-01-08 DIAGNOSIS — E785 Hyperlipidemia, unspecified: Secondary | ICD-10-CM

## 2024-01-08 MED ORDER — ASPIRIN 81 MG PO TBEC
DELAYED_RELEASE_TABLET | ORAL | 1 refills | Status: DC
Start: 2024-01-08 — End: 2024-07-08

## 2024-01-08 MED ORDER — FUROSEMIDE 40 MG PO TABS: ORAL_TABLET | ORAL | 1 refills | Status: DC

## 2024-01-08 MED ORDER — LOSARTAN POTASSIUM 100 MG PO TABS: ORAL_TABLET | ORAL | 1 refills | Status: DC

## 2024-01-08 MED ORDER — LEVOTHYROXINE SODIUM 100 MCG PO TABS: ORAL_TABLET | ORAL | 1 refills | Status: DC

## 2024-01-08 MED ORDER — RISPERIDONE 0.5 MG PO TABS: ORAL_TABLET | ORAL | 1 refills | Status: DC

## 2024-01-08 MED ORDER — ROSUVASTATIN CALCIUM 5 MG PO TABS
ORAL_TABLET | ORAL | 1 refills | Status: DC
Start: 2024-01-08 — End: 2024-06-29

## 2024-01-08 NOTE — Progress Notes (Addendum)
 Office Note 01/08/2024  CC:  Chief Complaint  Patient presents with   Establish Care   Medical Management of Chronic Issues    Pt's son states she gets agitated and has dementia which causes her to have sun down syndrome. Currently taking Cymbalta  for mood but not as effective. Would like medications reviewed, unsure if necessary for her to continue all medications. Has foot concern    HPI:  Catherine Wood is a 89 y.o.  female who is here accompanied by her son (primary caregiver) and daughter to establish care, follow-up diabetes, hypertension,  and dementia Patient's most recent primary MD: Dr. Cassondra Cliff. Old records in epic/health Link EMR were reviewed prior to or during today's visit.  Main concern is "sundowning".  Nearly all the history is obtained today by patient's son and daughter.  Patient has severe hearing impairment. She has had progressive gradual short-term memory decline as well as general mild cognitive impairment.  Complicating this is very poor vision and hearing.  She has periods of anywhere from 1 to 3 days at a time during which she has hallucinations and insomnia and agitation.  These days are unpredictable in their timing.  Sometimes goes weeks between episodes.  Most recent episode was at least 3 weeks ago.  Reviewed home fasting glucoses and these are in the 130s to 160s range.  No hypoglycemia.  She takes 30 units of Lantus  daily. She was prescribed Trulicity  but this got to cost prohibitive. Home blood pressures typically normal per son's report.  She is on duloxetine  for peripheral neuropathy symptoms.  Not sure if it is helping. Son says she does not complain of pain anywhere.  Past Medical History:  Diagnosis Date   Anxiety and depression    Arthritis    Chronic constipation    Chronic renal insufficiency, stage 3 (moderate) (HCC)    Dementia (HCC)    ? vascular   Diabetic peripheral neuropathy (HCC)    duloxetine    DOE (dyspnea on exertion)     GERD (gastroesophageal reflux disease)    Hyperlipidemia    Hypertension    Hypothyroidism    IDDM (insulin  dependent diabetes mellitus)    Leg swelling    Lumbar spondylosis    w/mild stenosis   Macular degeneration    Obesity, class 2    OSA (obstructive sleep apnea)    does not wear cpap   Proximal humerus fracture    2024, proximal right   Varicose veins    Vitamin D  deficiency     Past Surgical History:  Procedure Laterality Date   ANTERIOR CERVICAL DECOMP/DISCECTOMY FUSION N/A 11/08/2014   Procedure: Cervical three-four,  Cervical six-seven anterior cervical decompression with fusion plating and bonegraft;  Surgeon: Audie Bleacher, MD;  Location: [facility];  Service: Neurosurgery;  Laterality: N/A;  Cervical three-four,  Cervical six-seven anterior cervical decompression with fusion plating and bonegraft   BIOPSY THYROID      benign thyroid  nodule removal   CATARACT EXTRACTION, BILATERAL     COLONOSCOPY W/ BIOPSIES AND POLYPECTOMY     ENDOMETRIAL BIOPSY  09/09/1998   TUBAL LIGATION     VARICOSE VEIN SURGERY      Family History  Problem Relation Age of Onset   Cancer Mother        colon   Cancer Father        GI   Breast cancer Neg Hx     Social History   Socioeconomic History   Marital status: Single  Spouse name: Not on file   Number of children: Not on file   Years of education: Not on file   Highest education level: Not on file  Occupational History   Not on file  Tobacco Use   Smoking status: Never   Smokeless tobacco: Never  Substance and Sexual Activity   Alcohol use: No   Drug use: No   Sexual activity: Not on file  Other Topics Concern   Not on file  Social History Narrative   Lives with her son.   Originally from [place].   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.  She was also a Visual merchandiser.   Social Drivers of Corporate investment banker Strain: Not on file  Food Insecurity: Not on file  Transportation  Needs: Not on file  Physical Activity: Not on file  Stress: Not on file  Social Connections: Unknown (12/25/2022)   [facility]   Social Network    Social Network: Not on file  Intimate Partner Violence: Not At Risk (12/25/2022)   [facility]   HITS    Over the last 12 months how often did your partner physically hurt you?: Never    Over the last 12 months how often did your partner insult you or talk down to you?: Never    Over the last 12 months how often did your partner threaten you with physical harm?: Never    Over the last 12 months how often did your partner scream or curse at you?: Never    Outpatient Encounter Medications as of 01/08/2024  Medication Sig   ACCU-CHEK SOFTCLIX LANCETS lancets CHECK BLOOD SUGER UP TO 3 TIMES A DAY   Ascorbic Acid (VITAMIN C) 1000 MG tablet Take 1,000 mg by mouth daily.   famotidine  (PEPCID ) 20 MG tablet Take 20 mg by mouth daily.   GNP ULTICARE PEN NEEDLES 32G X 4 MM MISC AS DIRECTED DAILY   insulin  glargine (LANTUS  SOLOSTAR) 100 UNIT/ML Solostar Pen INJECT 20 TO 34 UNITS NIGHTLY TO REACH FASTING GLUCOSE LESS THAN 130   metFORMIN  (GLUCOPHAGE ) 1000 MG tablet TAKE (1) TABLET TWICE A DAY WITH MEALS (BREAKFAST AND SUPPER)   Multiple Vitamin (MULTIVITAMIN ADULT PO) Take by mouth daily.   Omega-3 Fatty Acids (FISH OIL PO) Take 1,000 mg by mouth daily.   Polyethylene Glycol 3350  (MIRALAX  PO) Take 1 Scoop by mouth daily.   risperiDONE  (RISPERDAL ) 0.5 MG tablet 1-2 tabs po once a day as needed for agitation   vitamin B-12 (CYANOCOBALAMIN) 1000 MCG tablet Take 1,000 mcg by mouth daily.   [DISCONTINUED] aspirin  (SB LOW DOSE ASA EC) 81 MG EC tablet Take      1 tablet        Daily   [DISCONTINUED] DULoxetine  (CYMBALTA ) 30 MG capsule TAKE 1 CAPSULE ONCE DAILY FOR MOOD   [DISCONTINUED] furosemide  (LASIX ) 40 MG tablet TAKE ONE TABLET TWICE DAILY FOR FLUID RETENTION/ANKLE SWELLING   [DISCONTINUED] levothyroxine   (SYNTHROID ) 100 MCG tablet TAKE (1) TABLET DAILY BE- FORE BREAKFAST.   [DISCONTINUED] losartan  (COZAAR ) 100 MG tablet TAKE 1 TABLET ONCE A DAY FOR HIGH BLOOD PRESSURE   [DISCONTINUED] rosuvastatin  (CRESTOR ) 5 MG tablet TAKE ONE TABLET ONCE DAILY FOR CHOLESTEROL   aspirin  EC (SB LOW DOSE ASA EC) 81 MG tablet Take 1 tablet by mouth daily   furosemide  (LASIX ) 40 MG tablet TAKE ONE TABLET TWICE DAILY FOR FLUID RETENTION/ANKLE SWELLING   levothyroxine  (SYNTHROID ) 100 MCG tablet  TAKE (1) TABLET DAILY BE- FORE BREAKFAST.   losartan  (COZAAR ) 100 MG tablet TAKE 1 TABLET ONCE A DAY FOR HIGH BLOOD PRESSURE   rosuvastatin  (CRESTOR ) 5 MG tablet TAKE ONE TABLET ONCE DAILY FOR CHOLESTEROL   [DISCONTINUED] TRULICITY  1.5 MG/0.5ML SOPN INJECT 1.5MG  ONCE WEEKLY (Patient not taking: Reported on 01/08/2024)   No facility-administered encounter medications on file as of 01/08/2024.    Allergies  Allergen Reactions   Detrol [Tolterodine]     Review of Systems  Constitutional:  Negative for fatigue and fever.  HENT:  Negative for congestion and sore throat.   Respiratory:  Negative for cough.   Cardiovascular:  Negative for chest pain.  Gastrointestinal:  Negative for abdominal pain and nausea.  Genitourinary:  Negative for dysuria.  Musculoskeletal:  Negative for back pain and joint swelling.  Skin:  Negative for rash.  Neurological:  Negative for weakness and headaches.  Hematological:  Negative for adenopathy.    PE; Blood pressure 111/73, pulse 81, temperature 98.6 F (37 C), temperature source Oral, height 5\' 2"  (1.575 m), weight 172 lb 12.8 oz (78.4 kg), SpO2 96%. Body mass index is 31.61 kg/m.  Physical Exam  Gen: Alert, well appearing.  Patient is oriented to person in general situation. AFFECT: pleasant Cardiovascular: Regular rhythm and rate without murmur. Lungs are clear to auscultation bilaterally, breathing nonlabored. Abdomen is soft and nontender and nondistended. Extremities show no  pitting edema.  She has violaceous hue of the ankles and feet.  Skin is warm.  No skin breakdown. Foot exam - ; no swelling or tenderness.  Sensation is intact. Peripheral pulses are palpable. Toenails are normal.   Pertinent labs:  Last CBC Lab Results  Component Value Date   WBC 12.5 (H) 07/08/2023   HGB 12.7 07/08/2023   HCT 39.4 07/08/2023   MCV 87.9 07/08/2023   MCH 28.3 07/08/2023   RDW 12.4 07/08/2023   PLT 454 (H) 07/08/2023   Last metabolic panel Lab Results  Component Value Date   GLUCOSE 145 (H) 07/08/2023   NA 140 07/08/2023   K 4.9 07/08/2023   CL 97 (L) 07/08/2023   CO2 32 07/08/2023   BUN 26 (H) 07/08/2023   CREATININE 1.15 (H) 07/08/2023   EGFR 45 (L) 07/08/2023   CALCIUM  10.0 07/08/2023   PROT 7.0 07/08/2023   ALBUMIN 4.0 01/03/2017   BILITOT 0.3 07/08/2023   ALKPHOS 49 01/03/2017   AST 16 07/08/2023   ALT 12 07/08/2023   ANIONGAP 10 09/05/2016   Last lipids Lab Results  Component Value Date   CHOL 158 07/08/2023   HDL 57 07/08/2023   LDLCALC 72 07/08/2023   TRIG 194 (H) 07/08/2023   CHOLHDL 2.8 07/08/2023   Last hemoglobin A1c Lab Results  Component Value Date   HGBA1C 8.2 (H) 07/08/2023   Last thyroid  functions Lab Results  Component Value Date   TSH 2.31 07/08/2023   Last vitamin D  Lab Results  Component Value Date   VD25OH 97 04/07/2023   Last vitamin B12 and Folate Lab Results  Component Value Date   VITAMINB12 >2000 (H) 01/03/2017   ASSESSMENT AND PLAN:   New patient, establishing care.  1.  Dementia:?  Lewy body.  Suspect she has a component of vascular dementia. She is living with her son.  She cannot prep her meals or drive but otherwise can do ADLs independently. For her intermittent periods of agitation and insomnia we will try Risperdal  0.5 mg, 1-2 daily as needed.  2.  Diabetes with peripheral neuropathy. Not clear that Cymbalta  is helping the neuropathy.  Will discontinue this. She we will continue Lantus  30  units a day and metformin  1000 mg twice a day and today will check hemoglobin A1c. Goal hemoglobin A1c for her is less than 8%.  #3 hypertension, well-controlled on losartan  100 mg a day. Monitor electrolytes and creatinine today.  4.  Chronic renal insufficiency stage III. Avoid NSAIDs. If GFR is not greater than 45 mL minute today we will discontinue her metformin .  An After Visit Summary was printed and given to the patient  Return in about 4 weeks (around 02/05/2024) for f/u dementia/agitation.  Signed:  Arletha Lady, MD           01/08/2024

## 2024-01-09 LAB — COMPREHENSIVE METABOLIC PANEL WITH GFR
ALT: 11 U/L (ref 0–35)
AST: 14 U/L (ref 0–37)
Albumin: 4 g/dL (ref 3.5–5.2)
Alkaline Phosphatase: 49 U/L (ref 39–117)
BUN: 47 mg/dL — ABNORMAL HIGH (ref 6–23)
CO2: 30 meq/L (ref 19–32)
Calcium: 9.6 mg/dL (ref 8.4–10.5)
Chloride: 96 meq/L (ref 96–112)
Creatinine, Ser: 1.28 mg/dL — ABNORMAL HIGH (ref 0.40–1.20)
GFR: 36.67 mL/min — ABNORMAL LOW (ref >60.00)
Glucose, Bld: 114 mg/dL — ABNORMAL HIGH (ref 70–99)
Potassium: 4.3 meq/L (ref 3.5–5.1)
Sodium: 138 meq/L (ref 135–145)
Total Bilirubin: 0.3 mg/dL (ref 0.2–1.2)
Total Protein: 6.6 g/dL (ref 6.0–8.3)

## 2024-01-09 LAB — HEMOGLOBIN A1C: Hgb A1c MFr Bld: 8.6 % — ABNORMAL HIGH (ref 4.6–6.5)

## 2024-01-11 LAB — CBC WITH DIFFERENTIAL/PLATELET
Basophils Absolute: 0.2 10*3/uL — ABNORMAL HIGH (ref 0.0–0.1)
Basophils Relative: 1.4 % (ref 0.0–3.0)
Eosinophils Absolute: 0.4 10*3/uL (ref 0.0–0.7)
Eosinophils Relative: 3.5 % (ref 0.0–5.0)
HCT: 39.4 % (ref 36.0–46.0)
Hemoglobin: 12.4 g/dL (ref 12.0–15.0)
Lymphocytes Relative: 24.4 % (ref 12.0–46.0)
Lymphs Abs: 2.9 10*3/uL (ref 0.7–4.0)
MCHC: 31.5 g/dL (ref 30.0–36.0)
MCV: 91.1 fl (ref 78.0–100.0)
Monocytes Absolute: 0.6 10*3/uL (ref 0.1–1.0)
Monocytes Relative: 5.2 % (ref 3.0–12.0)
Neutro Abs: 7.9 10*3/uL — ABNORMAL HIGH (ref 1.4–7.7)
Neutrophils Relative %: 65.5 % (ref 43.0–77.0)
Platelets: 368 10*3/uL (ref 150.0–400.0)
RBC: 4.32 Mil/uL (ref 3.87–5.11)
RDW: 14.2 % (ref 11.5–15.5)
WBC: 12.1 10*3/uL — ABNORMAL HIGH (ref 4.0–10.5)

## 2024-02-05 ENCOUNTER — Encounter: Payer: Self-pay | Admitting: Family Medicine

## 2024-02-05 ENCOUNTER — Ambulatory Visit (INDEPENDENT_AMBULATORY_CARE_PROVIDER_SITE_OTHER): Admitting: Family Medicine

## 2024-02-05 VITALS — BP 127/69 | HR 62 | Temp 97.9°F | Ht 62.0 in | Wt 177.0 lb

## 2024-02-05 DIAGNOSIS — E1121 Type 2 diabetes mellitus with diabetic nephropathy: Secondary | ICD-10-CM

## 2024-02-05 DIAGNOSIS — F03918 Unspecified dementia, unspecified severity, with other behavioral disturbance: Secondary | ICD-10-CM

## 2024-02-05 DIAGNOSIS — Z794 Long term (current) use of insulin: Secondary | ICD-10-CM

## 2024-02-05 DIAGNOSIS — N1832 Chronic kidney disease, stage 3b: Secondary | ICD-10-CM | POA: Diagnosis not present

## 2024-02-05 DIAGNOSIS — Z79899 Other long term (current) drug therapy: Secondary | ICD-10-CM

## 2024-02-05 DIAGNOSIS — I10 Essential (primary) hypertension: Secondary | ICD-10-CM | POA: Diagnosis not present

## 2024-02-05 LAB — BASIC METABOLIC PANEL WITH GFR
BUN: 24 mg/dL — ABNORMAL HIGH (ref 6–23)
CO2: 34 meq/L — ABNORMAL HIGH (ref 19–32)
Calcium: 9 mg/dL (ref 8.4–10.5)
Chloride: 94 meq/L — ABNORMAL LOW (ref 96–112)
Creatinine, Ser: 1.12 mg/dL (ref 0.40–1.20)
GFR: 43.02 mL/min — ABNORMAL LOW (ref >60.00)
Glucose, Bld: 280 mg/dL — ABNORMAL HIGH (ref 70–99)
Potassium: 4.5 meq/L (ref 3.5–5.1)
Sodium: 134 meq/L — ABNORMAL LOW (ref 135–145)

## 2024-02-05 MED ORDER — LOSARTAN POTASSIUM 100 MG PO TABS
ORAL_TABLET | ORAL | Status: DC
Start: 2024-02-05 — End: 2024-07-27

## 2024-02-05 NOTE — Progress Notes (Signed)
 OFFICE VISIT  02/05/2024  CC:  Chief Complaint  Catherine Wood presents with   Medical Management of Chronic Issues    4 week f/u; pt's son states her sugars have slightly elevated.    Catherine Wood is a 89 y.o. female who presents accompanied by her son [name] for 1 month follow-up dementia with behavioral disturbance as well as follow-up diabetes. A/P as of last visit: "1.  Dementia:?  Lewy body.  Suspect she has a component of vascular dementia. She is living with her son.  She cannot prep her meals or drive but otherwise can do ADLs independently. For her intermittent periods of agitation and insomnia we will try Risperdal  0.5 mg, 1-2 daily as needed.   2.  Diabetes with peripheral neuropathy. Not clear that Cymbalta  is helping the neuropathy.  Will discontinue this. She we will continue Lantus  30 units a day and metformin  1000 mg twice a day and today will check hemoglobin A1c. Goal hemoglobin A1c for her is less than 8%.   #3 hypertension, well-controlled on losartan  100 mg a day. Monitor electrolytes and creatinine today.   4.  Chronic renal insufficiency stage III. Avoid NSAIDs. If GFR is not greater than 45 mL minute today we will discontinue her metformin ."  INTERIM HX: Labs for last visit showed GFR of 37 and hemoglobin A1c 8.6%. I recommended she stop her metformin . I also recommended she cut her losartan  100 mg tab in half daily and increase fluid intake.  She has not required the Risperdal  at all. No hallucinations or erratic behavior.  Home blood pressure is consistently normal.  Glucoses have been significantly elevated since getting off the metformin . Average approximately 250 the last couple of weeks. She eats well, occasionally even gets up in the night and snacks.  Minimal lower extremity swelling: She does take Lasix  40 mg twice a day every day.  ROS as above, plus--> no fevers, no CP, no SOB, no wheezing, no cough, no dizziness, no HAs, no rashes, no  melena/hematochezia.  No focal weakness, paresthesias, or tremors.  No n/v/d or abd pain.  No palpitations.     Past Medical History:  Diagnosis Date   Anxiety and depression    Arthritis    Chronic constipation    Chronic renal insufficiency, stage 3 (moderate) (HCC)    Dementia (HCC)    ? vascular   Diabetic peripheral neuropathy (HCC)    duloxetine    DOE (dyspnea on exertion)    GERD (gastroesophageal reflux disease)    Hyperlipidemia    Hypertension    Hypothyroidism    IDDM (insulin  dependent diabetes mellitus)    Leg swelling    Lumbar spondylosis    w/mild stenosis   Macular degeneration    Obesity, class 2    OSA (obstructive sleep apnea)    does not wear cpap   Proximal humerus fracture    2024, proximal right   Varicose veins    Vitamin D  deficiency     Past Surgical History:  Procedure Laterality Date   ANTERIOR CERVICAL DECOMP/DISCECTOMY FUSION N/A 11/08/2014   Procedure: Cervical three-four,  Cervical six-seven anterior cervical decompression with fusion plating and bonegraft;  Surgeon: Audie Bleacher, MD;  Location: [facility];  Service: Neurosurgery;  Laterality: N/A;  Cervical three-four,  Cervical six-seven anterior cervical decompression with fusion plating and bonegraft   BIOPSY THYROID      benign thyroid  nodule removal   CATARACT EXTRACTION, BILATERAL     COLONOSCOPY W/ BIOPSIES AND POLYPECTOMY  ENDOMETRIAL BIOPSY  09/09/1998   TUBAL LIGATION     VARICOSE VEIN SURGERY      Outpatient Medications Prior to Visit  Medication Sig Dispense Refill   ACCU-CHEK SOFTCLIX LANCETS lancets CHECK BLOOD SUGER UP TO 3 TIMES A DAY 100 each 0   Ascorbic Acid (VITAMIN C) 1000 MG tablet Take 1,000 mg by mouth daily.     aspirin  EC (SB LOW DOSE ASA EC) 81 MG tablet Take 1 tablet by mouth daily 90 tablet 1   famotidine  (PEPCID ) 20 MG tablet Take 20 mg by mouth daily.     furosemide  (LASIX ) 40 MG tablet TAKE ONE TABLET TWICE DAILY FOR FLUID RETENTION/ANKLE SWELLING  180 tablet 1   GNP ULTICARE PEN NEEDLES 32G X 4 MM MISC AS DIRECTED DAILY 100 each 3   insulin  glargine (LANTUS  SOLOSTAR) 100 UNIT/ML Solostar Pen INJECT 20 TO 34 UNITS NIGHTLY TO REACH FASTING GLUCOSE LESS THAN 130 15 mL 3   levothyroxine  (SYNTHROID ) 100 MCG tablet TAKE (1) TABLET DAILY BE- FORE BREAKFAST. 90 tablet 1   Multiple Vitamin (MULTIVITAMIN ADULT PO) Take by mouth daily.     Omega-3 Fatty Acids (FISH OIL PO) Take 1,000 mg by mouth daily.     Polyethylene Glycol 3350  (MIRALAX  PO) Take 1 Scoop by mouth daily.     risperiDONE  (RISPERDAL ) 0.5 MG tablet 1-2 tabs po once a day as needed for agitation 10 tablet 1   rosuvastatin  (CRESTOR ) 5 MG tablet TAKE ONE TABLET ONCE DAILY FOR CHOLESTEROL 90 tablet 1   vitamin B-12 (CYANOCOBALAMIN) 1000 MCG tablet Take 1,000 mcg by mouth daily.     losartan  (COZAAR ) 100 MG tablet TAKE 1 TABLET ONCE A DAY FOR HIGH BLOOD PRESSURE 90 tablet 1   metFORMIN  (GLUCOPHAGE ) 1000 MG tablet TAKE (1) TABLET TWICE A DAY WITH MEALS (BREAKFAST AND SUPPER) 180 tablet 3   No facility-administered medications prior to visit.    Allergies  Allergen Reactions   Detrol [Tolterodine]     Review of Systems As per HPI  PE:    02/05/2024    1:48 PM 01/08/2024    2:00 PM 09/11/2023   11:51 AM  Vitals with BMI  Height 5\' 2"  5\' 2"    Weight 177 lbs 172 lbs 13 oz   BMI 32.37 31.6   Systolic 127 111 578  Diastolic 69 73 63  Pulse 62 81 70   Physical Exam  General: Sitting in wheelchair and appears well.  Extremely hard of hearing and she is blind. Extremities: 1+ pitting edema bilaterally.  She has a couple of superficial excoriations/scabs where she has been picking at her skin.  No surrounding erythema or swelling.  LABS:  Last CBC Lab Results  Component Value Date   WBC 12.1 (H) 01/08/2024   HGB 12.4 01/08/2024   HCT 39.4 01/08/2024   MCV 91.1 01/08/2024   MCH 28.3 07/08/2023   RDW 14.2 01/08/2024   PLT 368.0 01/08/2024   Last metabolic panel Lab Results   Component Value Date   GLUCOSE 114 (H) 01/08/2024   NA 138 01/08/2024   K 4.3 01/08/2024   CL 96 01/08/2024   CO2 30 01/08/2024   BUN 47 (H) 01/08/2024   CREATININE 1.28 (H) 01/08/2024   GFR 36.67 (L) 01/08/2024   CALCIUM  9.6 01/08/2024   PROT 6.6 01/08/2024   ALBUMIN 4.0 01/08/2024   BILITOT 0.3 01/08/2024   ALKPHOS 49 01/08/2024   AST 14 01/08/2024   ALT 11 01/08/2024   ANIONGAP 10  09/05/2016   Last lipids Lab Results  Component Value Date   CHOL 158 07/08/2023   HDL 57 07/08/2023   LDLCALC 72 07/08/2023   TRIG 194 (H) 07/08/2023   CHOLHDL 2.8 07/08/2023   Last hemoglobin A1c Lab Results  Component Value Date   HGBA1C 8.6 (H) 01/08/2024   Last thyroid  functions Lab Results  Component Value Date   TSH 2.31 07/08/2023   Last vitamin D  Lab Results  Component Value Date   VD25OH 97 04/07/2023   Last vitamin B12 and Folate Lab Results  Component Value Date   VITAMINB12 >2000 (H) 01/03/2017   IMPRESSION AND PLAN:  #1 dementia with behavioral disturbance. Her behavior has been good and there has been no opportunity to try the Risperdal  as of yet.  2.  Diabetes with nephropathy, poor control. Increase Lantus  to 34 units daily.  Gradually increase by 1 to 2 units every couple of days to get to goal of approximately 125 fasting. Hemoglobin A1c goal is around 8% or less. Next hemoglobin A1c in approximately 2 months.  3.  Chronic renal insufficiency stage IIIb. We had to discontinue her metformin . We decreased her losartan  to 50 mg a day. Monitor electrolytes and renal function today.  If GFR continuing to drop significantly we will have to decrease her Lasix .  #4 hypertension, remaining well-controlled on losartan  50 mg a day.  An After Visit Summary was printed and given to the Catherine Wood.  FOLLOW UP: Return in about 2 months (around 04/06/2024) for routine chronic illness f/u.  Signed:  Arletha Lady, MD           02/05/2024

## 2024-02-07 ENCOUNTER — Ambulatory Visit: Payer: Self-pay | Admitting: Family Medicine

## 2024-04-08 ENCOUNTER — Ambulatory Visit (INDEPENDENT_AMBULATORY_CARE_PROVIDER_SITE_OTHER): Admitting: Family Medicine

## 2024-04-08 ENCOUNTER — Encounter: Payer: Self-pay | Admitting: Family Medicine

## 2024-04-08 VITALS — BP 118/76 | HR 67 | Temp 97.8°F | Ht 62.0 in | Wt 179.4 lb

## 2024-04-08 DIAGNOSIS — N1832 Chronic kidney disease, stage 3b: Secondary | ICD-10-CM | POA: Diagnosis not present

## 2024-04-08 DIAGNOSIS — I10 Essential (primary) hypertension: Secondary | ICD-10-CM | POA: Diagnosis not present

## 2024-04-08 DIAGNOSIS — E1121 Type 2 diabetes mellitus with diabetic nephropathy: Secondary | ICD-10-CM | POA: Diagnosis not present

## 2024-04-08 DIAGNOSIS — Z794 Long term (current) use of insulin: Secondary | ICD-10-CM | POA: Diagnosis not present

## 2024-04-08 LAB — POCT GLYCOSYLATED HEMOGLOBIN (HGB A1C)
HbA1c POC (<> result, manual entry): 10.4 % (ref 4.0–5.6)
HbA1c, POC (controlled diabetic range): 10.4 % — AB (ref 0.0–7.0)
HbA1c, POC (prediabetic range): 10.4 % — AB (ref 5.7–6.4)
Hemoglobin A1C: 10.4 % — AB (ref 4.0–5.6)

## 2024-04-08 MED ORDER — NOVOLIN 70/30 (70-30) 100 UNIT/ML ~~LOC~~ SUSP: SUBCUTANEOUS | 11 refills | Status: DC

## 2024-04-08 NOTE — Patient Instructions (Signed)
 Glucose every morning before breakfast and every evening before supper.  Call if glucose level is consistently greater than 200.

## 2024-04-08 NOTE — Progress Notes (Signed)
 Check OFFICE VISIT  04/08/2024  CC:  Chief Complaint  Patient presents with   Medical Management of Chronic Issues    Patient is a 89 y.o. female who presents for 19-month follow-up dementia, diabetes, hypertension, and chronic renal insufficiency A/P as of last visit: #1 dementia with behavioral disturbance. Her behavior has been good and there has been no opportunity to try the Risperdal  as of yet.   2.  Diabetes with nephropathy, poor control. Increase Lantus  to 34 units daily.  Gradually increase by 1 to 2 units every couple of days to get to goal of approximately 125 fasting. Hemoglobin A1c goal is around 8% or less. Next hemoglobin A1c in approximately 2 months.   3.  Chronic renal insufficiency stage IIIb. We had to discontinue her metformin . We decreased her losartan  to 50 mg a day. Monitor electrolytes and renal function today.  If GFR continuing to drop significantly we will have to decrease her Lasix .   #4 hypertension, remaining well-controlled on losartan  50 mg a day.  INTERIM HX: Taira has been feeling well. She has not had any behavioral disturbances to the degree of requiring Risperdal .  Glucoses from the last 6 weeks were reviewed today.  Some of the numbers are prebreakfast and some after she has eaten breakfast but they are not labeled.  The numbers range anywhere from 120-450.  Average is probably around 200.  She eats 3 meals a day.  ROS --> no fevers, no CP, no SOB, no wheezing, no cough, no dizziness, no HAs, no rashes, no melena/hematochezia.  No polyuria or polydipsia.  No myalgias or arthralgias.  No focal weakness, paresthesias, or tremors.  No acute vision or hearing abnormalities.  No dysuria or unusual/new urinary urgency or frequency.  No recent changes in lower legs. No n/v/d or abd pain.  No palpitations.    Past Medical History:  Diagnosis Date   Anxiety and depression    Arthritis    Chronic constipation    Chronic renal insufficiency, stage  3 (moderate) (HCC)    Combined visual and hearing impairment    Dementia (HCC)    ? vascular   Diabetic peripheral neuropathy (HCC)    duloxetine    DOE (dyspnea on exertion)    GERD (gastroesophageal reflux disease)    Hyperlipidemia    Hypertension    Hypothyroidism    IDDM (insulin  dependent diabetes mellitus)    Leg swelling    Lumbar spondylosis    w/mild stenosis   Macular degeneration    Obesity, class 2    OSA (obstructive sleep apnea)    does not wear cpap   Proximal humerus fracture    2024, proximal right   Varicose veins    Vitamin D  deficiency     Past Surgical History:  Procedure Laterality Date   ANTERIOR CERVICAL DECOMP/DISCECTOMY FUSION N/A 11/08/2014   Procedure: Cervical three-four,  Cervical six-seven anterior cervical decompression with fusion plating and bonegraft;  Surgeon: Rockey Peru, MD;  Location: [facility];  Service: Neurosurgery;  Laterality: N/A;  Cervical three-four,  Cervical six-seven anterior cervical decompression with fusion plating and bonegraft   BIOPSY THYROID      benign thyroid  nodule removal   CATARACT EXTRACTION, BILATERAL     COLONOSCOPY W/ BIOPSIES AND POLYPECTOMY     ENDOMETRIAL BIOPSY  09/09/1998   TUBAL LIGATION     VARICOSE VEIN SURGERY      Outpatient Medications Prior to Visit  Medication Sig Dispense Refill   ACCU-CHEK SOFTCLIX LANCETS  lancets CHECK BLOOD SUGER UP TO 3 TIMES A DAY 100 each 0   Ascorbic Acid (VITAMIN C) 1000 MG tablet Take 1,000 mg by mouth daily.     aspirin  EC (SB LOW DOSE ASA EC) 81 MG tablet Take 1 tablet by mouth daily 90 tablet 1   famotidine  (PEPCID ) 20 MG tablet Take 20 mg by mouth daily.     furosemide  (LASIX ) 40 MG tablet TAKE ONE TABLET TWICE DAILY FOR FLUID RETENTION/ANKLE SWELLING 180 tablet 1   GNP ULTICARE PEN NEEDLES 32G X 4 MM MISC AS DIRECTED DAILY 100 each 3   levothyroxine  (SYNTHROID ) 100 MCG tablet TAKE (1) TABLET DAILY BE- FORE BREAKFAST. 90 tablet 1   losartan  (COZAAR ) 100 MG  tablet 1/2 TABLET ONCE A DAY FOR HIGH BLOOD PRESSURE     Multiple Vitamin (MULTIVITAMIN ADULT PO) Take by mouth daily.     Omega-3 Fatty Acids (FISH OIL PO) Take 1,000 mg by mouth daily.     Polyethylene Glycol 3350  (MIRALAX  PO) Take 1 Scoop by mouth daily.     risperiDONE  (RISPERDAL ) 0.5 MG tablet 1-2 tabs po once a day as needed for agitation 10 tablet 1   rosuvastatin  (CRESTOR ) 5 MG tablet TAKE ONE TABLET ONCE DAILY FOR CHOLESTEROL 90 tablet 1   vitamin B-12 (CYANOCOBALAMIN) 1000 MCG tablet Take 1,000 mcg by mouth daily.     insulin  glargine (LANTUS  SOLOSTAR) 100 UNIT/ML Solostar Pen INJECT 20 TO 34 UNITS NIGHTLY TO REACH FASTING GLUCOSE LESS THAN 130 (Patient taking differently: Inject 40 Units into the skin daily. INJECT 20 TO 34 UNITS NIGHTLY TO REACH FASTING GLUCOSE LESS THAN 130) 15 mL 3   No facility-administered medications prior to visit.    Allergies  Allergen Reactions   Detrol [Tolterodine]     Review of Systems As per HPI  PE:    04/08/2024    1:35 PM 02/05/2024    1:48 PM 01/08/2024    2:00 PM  Vitals with BMI  Height 5' 2 5' 2 5' 2  Weight 179 lbs 6 oz 177 lbs 172 lbs 13 oz  BMI 32.8 32.37 31.6  Systolic 118 127 888  Diastolic 76 69 73  Pulse 67 62 81     Physical Exam  Gen: Alert, well appearing, sitting in wheelchair.  Patient is oriented to person and general situation. Very hard of hearing and very poor vision. She smiles and makes eye contact when spoken to. No further exam today.   LABS:  Last CBC Lab Results  Component Value Date   WBC 12.1 (H) 01/08/2024   HGB 12.4 01/08/2024   HCT 39.4 01/08/2024   MCV 91.1 01/08/2024   MCH 28.3 07/08/2023   RDW 14.2 01/08/2024   PLT 368.0 01/08/2024   Last metabolic panel Lab Results  Component Value Date   GLUCOSE 280 (H) 02/05/2024   NA 134 (L) 02/05/2024   K 4.5 02/05/2024   CL 94 (L) 02/05/2024   CO2 34 (H) 02/05/2024   BUN 24 (H) 02/05/2024   CREATININE 1.12 02/05/2024   GFR 43.02 (L)  02/05/2024   CALCIUM  9.0 02/05/2024   PROT 6.6 01/08/2024   ALBUMIN 4.0 01/08/2024   BILITOT 0.3 01/08/2024   ALKPHOS 49 01/08/2024   AST 14 01/08/2024   ALT 11 01/08/2024   ANIONGAP 10 09/05/2016   Last lipids Lab Results  Component Value Date   CHOL 158 07/08/2023   HDL 57 07/08/2023   LDLCALC 72 07/08/2023   TRIG 194 (H)  07/08/2023   CHOLHDL 2.8 07/08/2023   Last hemoglobin A1c Lab Results  Component Value Date   HGBA1C 10.4 (A) 04/08/2024   HGBA1C 10.4 04/08/2024   HGBA1C 10.4 (A) 04/08/2024   HGBA1C 10.4 (A) 04/08/2024   Last thyroid  functions Lab Results  Component Value Date   TSH 2.31 07/08/2023   Last vitamin D  Lab Results  Component Value Date   VD25OH 97 04/07/2023   Last vitamin B12 and Folate Lab Results  Component Value Date   VITAMINB12 >2000 (H) 01/03/2017   IMPRESSION AND PLAN:  #1 dementia with behavioral disturbance. Her behavior has been good and there has been no opportunity to try the Risperdal  as of yet.   2.  Diabetes with nephropathy, poor control. Hemoglobin A1c today is 10.4%. DC Lantus . Start Novolin  70/30, 45 units every morning and 25 units before supper. Hemoglobin A1c goal is around 8% or less. Making insulin  adjustments based on glucose measurements will be very difficult since they are not able to time the glucose checks consistently before meals or after meals. I asked them to call our office if her glucoses are consistently greater than 200.   3.  Chronic renal insufficiency stage IIIb. We decreased her losartan  to 50 mg a day. Monitor electrolytes and renal function today.  If GFR is dropping significantly we will have to decrease her Lasix .   #4 hypertension, remaining well-controlled on losartan  50 mg a day.  An After Visit Summary was printed and given to the patient.  FOLLOW UP: Return in about 4 weeks (around 05/06/2024) for routine chronic illness f/u.  Signed:  Gerlene Hockey, MD           04/08/2024

## 2024-04-09 LAB — MICROALBUMIN / CREATININE URINE RATIO
Creatinine,U: 38.7 mg/dL
Microalb Creat Ratio: 215.9 mg/g — ABNORMAL HIGH (ref 0.0–30.0)
Microalb, Ur: 8.4 mg/dL — ABNORMAL HIGH (ref 0.0–1.9)

## 2024-04-09 LAB — BASIC METABOLIC PANEL WITH GFR
BUN: 32 mg/dL — ABNORMAL HIGH (ref 6–23)
CO2: 33 meq/L — ABNORMAL HIGH (ref 19–32)
Calcium: 8.7 mg/dL (ref 8.4–10.5)
Chloride: 94 meq/L — ABNORMAL LOW (ref 96–112)
Creatinine, Ser: 1.48 mg/dL — ABNORMAL HIGH (ref 0.40–1.20)
GFR: 30.75 mL/min — ABNORMAL LOW (ref >60.00)
Glucose, Bld: 232 mg/dL — ABNORMAL HIGH (ref 70–99)
Potassium: 4.4 meq/L (ref 3.5–5.1)
Sodium: 136 meq/L (ref 135–145)

## 2024-04-13 ENCOUNTER — Ambulatory Visit: Payer: Self-pay | Admitting: Family Medicine

## 2024-04-13 DIAGNOSIS — N1832 Chronic kidney disease, stage 3b: Secondary | ICD-10-CM

## 2024-04-13 NOTE — Addendum Note (Signed)
 Addended by: CLAUDENE SHANDA ORN on: 04/13/2024 02:19 PM   Modules accepted: Orders

## 2024-05-06 ENCOUNTER — Encounter: Payer: Self-pay | Admitting: Family Medicine

## 2024-05-06 ENCOUNTER — Ambulatory Visit (INDEPENDENT_AMBULATORY_CARE_PROVIDER_SITE_OTHER): Admitting: Family Medicine

## 2024-05-06 VITALS — BP 130/56 | HR 61 | Temp 98.1°F | Ht 62.0 in | Wt 191.0 lb

## 2024-05-06 DIAGNOSIS — R600 Localized edema: Secondary | ICD-10-CM | POA: Diagnosis not present

## 2024-05-06 DIAGNOSIS — I872 Venous insufficiency (chronic) (peripheral): Secondary | ICD-10-CM | POA: Diagnosis not present

## 2024-05-06 DIAGNOSIS — N183 Chronic kidney disease, stage 3 unspecified: Secondary | ICD-10-CM | POA: Diagnosis not present

## 2024-05-06 DIAGNOSIS — I10 Essential (primary) hypertension: Secondary | ICD-10-CM

## 2024-05-06 NOTE — Progress Notes (Signed)
 OFFICE VISIT  05/16/2024  CC:  Chief Complaint  Patient presents with   Medical Management of Chronic Issues    Patient is a 89 y.o. female who presents accompanied by her son and daughter for 1 mo f/u poorly controlled DM. A/P as of last visit: 1 dementia with behavioral disturbance. Her behavior has been good and there has been no opportunity to try the Risperdal  as of yet.   2.  Diabetes with nephropathy, poor control. Hemoglobin A1c today is 10.4%. DC Lantus . Start Novolin  70/30, 45 units every morning and 25 units before supper. Hemoglobin A1c goal is around 8% or less. Making insulin  adjustments based on glucose measurements will be very difficult since they are not able to time the glucose checks consistently before meals or after meals. I asked them to call our office if her glucoses are consistently greater than 200.   3.  Chronic renal insufficiency stage IIIb. We decreased her losartan  to 50 mg a day. Monitor electrolytes and renal function today.  If GFR is dropping significantly we will have to decrease her Lasix .   #4 hypertension, remaining well-controlled on losartan  50 mg a day.  INTERIM HX: Appetite very good.  Eats without restriction. Taking 40 U 70/30 insulin  qAM and 30 U qPM. Glucoses very erratic, most of them very high.  Is currently taking 40 units of Novolin  70/30 in the morning and 30 units in evening.  Sodium intake is appropriate.  Lower legs swelling a lot more lately. Of note, she has had many procedures for varicose veins in the lower extremities over her lifetime.  Past Medical History:  Diagnosis Date   Anxiety and depression    Arthritis    Chronic constipation    Chronic renal insufficiency, stage 3 (moderate) (HCC)    Combined visual and hearing impairment    Dementia (HCC)    ? vascular   Diabetic peripheral neuropathy (HCC)    duloxetine    DOE (dyspnea on exertion)    GERD (gastroesophageal reflux disease)    Hyperlipidemia     Hypertension    Hypothyroidism    IDDM (insulin  dependent diabetes mellitus)    Leg swelling    Lumbar spondylosis    w/mild stenosis   Macular degeneration    Obesity, class 2    OSA (obstructive sleep apnea)    does not wear cpap   Proximal humerus fracture    2024, proximal right   Varicose veins    Vitamin D  deficiency     Past Surgical History:  Procedure Laterality Date   ANTERIOR CERVICAL DECOMP/DISCECTOMY FUSION N/A 11/08/2014   Procedure: Cervical three-four,  Cervical six-seven anterior cervical decompression with fusion plating and bonegraft;  Surgeon: Rockey Peru, MD;  Location: [facility];  Service: Neurosurgery;  Laterality: N/A;  Cervical three-four,  Cervical six-seven anterior cervical decompression with fusion plating and bonegraft   BIOPSY THYROID      benign thyroid  nodule removal   CATARACT EXTRACTION, BILATERAL     COLONOSCOPY W/ BIOPSIES AND POLYPECTOMY     ENDOMETRIAL BIOPSY  09/09/1998   TUBAL LIGATION     VARICOSE VEIN SURGERY      Outpatient Medications Prior to Visit  Medication Sig Dispense Refill   ACCU-CHEK SOFTCLIX LANCETS lancets CHECK BLOOD SUGER UP TO 3 TIMES A DAY 100 each 0   Ascorbic Acid (VITAMIN C) 1000 MG tablet Take 1,000 mg by mouth daily.     aspirin  EC (SB LOW DOSE ASA EC) 81 MG tablet Take  1 tablet by mouth daily 90 tablet 1   famotidine  (PEPCID ) 20 MG tablet Take 20 mg by mouth daily.     furosemide  (LASIX ) 40 MG tablet TAKE ONE TABLET TWICE DAILY FOR FLUID RETENTION/ANKLE SWELLING 180 tablet 1   GNP ULTICARE PEN NEEDLES 32G X 4 MM MISC AS DIRECTED DAILY 100 each 3   insulin  NPH-regular Human (NOVOLIN  70/30) (70-30) 100 UNIT/ML injection 45 U SQ qAM and 25 U SQ qSupper (Patient taking differently: 45 U SQ qAM and 25 U SQ qSupper) 10 mL 11   levothyroxine  (SYNTHROID ) 100 MCG tablet TAKE (1) TABLET DAILY BE- FORE BREAKFAST. 90 tablet 1   losartan  (COZAAR ) 100 MG tablet 1/2 TABLET ONCE A DAY FOR HIGH BLOOD PRESSURE     Multiple  Vitamin (MULTIVITAMIN ADULT PO) Take by mouth daily.     Omega-3 Fatty Acids (FISH OIL PO) Take 1,000 mg by mouth daily.     Polyethylene Glycol 3350  (MIRALAX  PO) Take 1 Scoop by mouth daily.     risperiDONE  (RISPERDAL ) 0.5 MG tablet 1-2 tabs po once a day as needed for agitation 10 tablet 1   rosuvastatin  (CRESTOR ) 5 MG tablet TAKE ONE TABLET ONCE DAILY FOR CHOLESTEROL 90 tablet 1   vitamin B-12 (CYANOCOBALAMIN) 1000 MCG tablet Take 1,000 mcg by mouth daily.     No facility-administered medications prior to visit.    Allergies  Allergen Reactions   Detrol [Tolterodine]     Review of Systems As per HPI  PE:    05/06/2024    2:18 PM 05/06/2024    1:51 PM 04/08/2024    1:35 PM  Vitals with BMI  Height  5' 2 5' 2  Weight  191 lbs 179 lbs 6 oz  BMI  34.93 32.8  Systolic 130 147 881  Diastolic 56 54 76  Pulse  61 67   Physical Exam  Gen: Alert, well appearing.  Patient is oriented to person, place, time, and situation. AFFECT: pleasant Pitting edema 3+ in both lower legs. Left calf circumference at 10 cm below the inferior border the patella is 40 cm.  Right side is 39 cm. She has some noninflamed varicosities on both lower legs.  LABS:  Last CBC Lab Results  Component Value Date   WBC 12.1 (H) 01/08/2024   HGB 12.4 01/08/2024   HCT 39.4 01/08/2024   MCV 91.1 01/08/2024   MCH 28.3 07/08/2023   RDW 14.2 01/08/2024   PLT 368.0 01/08/2024   Last metabolic panel Lab Results  Component Value Date   GLUCOSE 232 (H) 04/08/2024   NA 136 04/08/2024   K 4.4 04/08/2024   CL 94 (L) 04/08/2024   CO2 33 (H) 04/08/2024   BUN 32 (H) 04/08/2024   CREATININE 1.48 (H) 04/08/2024   GFR 30.75 (L) 04/08/2024   CALCIUM  8.7 04/08/2024   PROT 6.6 01/08/2024   ALBUMIN 4.0 01/08/2024   BILITOT 0.3 01/08/2024   ALKPHOS 49 01/08/2024   AST 14 01/08/2024   ALT 11 01/08/2024   ANIONGAP 10 09/05/2016   Last lipids Lab Results  Component Value Date   CHOL 158 07/08/2023   HDL 57  07/08/2023   LDLCALC 72 07/08/2023   TRIG 194 (H) 07/08/2023   CHOLHDL 2.8 07/08/2023   Last hemoglobin A1c Lab Results  Component Value Date   HGBA1C 10.4 (A) 04/08/2024   HGBA1C 10.4 04/08/2024   HGBA1C 10.4 (A) 04/08/2024   HGBA1C 10.4 (A) 04/08/2024   Last thyroid  functions Lab Results  Component Value Date   TSH 2.31 07/08/2023   Last vitamin D  Lab Results  Component Value Date   VD25OH 97 04/07/2023   Last vitamin B12 and Folate Lab Results  Component Value Date   VITAMINB12 >2000 (H) 01/03/2017   IMPRESSION AND PLAN:  1.  Diabetes with nephropathy, poor control. Dietary habits make it very hard to adjust insulins.  Our main focus will be to avoid extremes. For now, continue 40 units of 70/30 insulin  every morning and 30 units every evening. Hemoglobin A1c goal is around 8%. Next A1c due around 07/09/24.  2.  Chronic renal insufficiency stage IIIb. We decreased her losartan  to 50 mg a day about 1 month ago. Will monitor basic metabolic panel at follow-up in 2 weeks.   3 hypertension, remaining pretty well-controlled on losartan  50 mg a day.  #4 bilateral lower extremity edema.  She has venous insufficiency. Continue 80 mg Lasix  in the morning and add 40 mg in afternoon. Recheck basic metabolic panel in 2 weeks.  An After Visit Summary was printed and given to the patient.  FOLLOW UP: Return in about 2 weeks (around 05/20/2024) for recheck of current acute problem.  Signed:  Gerlene Hockey, MD           05/16/2024

## 2024-05-20 ENCOUNTER — Ambulatory Visit: Admitting: Family Medicine

## 2024-05-27 ENCOUNTER — Encounter: Payer: Self-pay | Admitting: Family Medicine

## 2024-05-27 ENCOUNTER — Ambulatory Visit (INDEPENDENT_AMBULATORY_CARE_PROVIDER_SITE_OTHER): Admitting: Family Medicine

## 2024-05-27 VITALS — BP 146/70 | HR 69 | Temp 97.8°F | Ht 62.0 in | Wt 194.2 lb

## 2024-05-27 DIAGNOSIS — Z23 Encounter for immunization: Secondary | ICD-10-CM

## 2024-05-27 DIAGNOSIS — I10 Essential (primary) hypertension: Secondary | ICD-10-CM | POA: Diagnosis not present

## 2024-05-27 DIAGNOSIS — R600 Localized edema: Secondary | ICD-10-CM | POA: Diagnosis not present

## 2024-05-27 DIAGNOSIS — N1832 Chronic kidney disease, stage 3b: Secondary | ICD-10-CM

## 2024-05-27 DIAGNOSIS — Z79899 Other long term (current) drug therapy: Secondary | ICD-10-CM

## 2024-05-27 MED ORDER — FUROSEMIDE 40 MG PO TABS: ORAL_TABLET | ORAL | Status: DC

## 2024-05-27 NOTE — Progress Notes (Signed)
 OFFICE VISIT  05/27/2024  CC:  Chief Complaint  Patient presents with   Medical Management of Chronic Issues   Patient is a 89 y.o. female who presents accompanied by her son [name] for 3-week follow-up diabetes, hypertension, chronic renal insufficiency, and bilateral lower extreme edema. A/P as of last visit: 1.  Diabetes with nephropathy, poor control. Dietary habits make it very hard to adjust insulins.  Our main focus will be to avoid extremes. For now, continue 40 units of 70/30 insulin  every morning and 30 units every evening. Hemoglobin A1c goal is around 8%. Next A1c due around 07/09/24.   2.  Chronic renal insufficiency stage IIIb. We decreased her losartan  to 50 mg a day about 1 month ago. Will monitor basic metabolic panel at follow-up in 2 weeks.   3 hypertension, remaining pretty well-controlled on losartan  50 mg a day.   #4 bilateral lower extremity edema.  She has venous insufficiency. Continue 80 mg Lasix  in the morning and add 40 mg in afternoon. Recheck basic metabolic panel in 2 weeks.  INTERIM HX: Catherine Wood is doing well. The swelling in her legs has gone down a little bit.  The 80 mg in the morning and 40 mg in the evening Lasix  regimen seems to be doing well.  Home glucoses ranging anywhere from 80-250. She eats very well, no restrictions.  Past Medical History:  Diagnosis Date   Anxiety and depression    Arthritis    Chronic constipation    Chronic renal insufficiency, stage 3 (moderate) (HCC)    Combined visual and hearing impairment    Dementia (HCC)    ? vascular   Diabetic peripheral neuropathy (HCC)    duloxetine    DOE (dyspnea on exertion)    GERD (gastroesophageal reflux disease)    Hyperlipidemia    Hypertension    Hypothyroidism    IDDM (insulin  dependent diabetes mellitus)    Leg swelling    Lumbar spondylosis    w/mild stenosis   Macular degeneration    Obesity, class 2    OSA (obstructive sleep apnea)    does not wear cpap    Proximal humerus fracture    2024, proximal right   Varicose veins    Vitamin D  deficiency     Past Surgical History:  Procedure Laterality Date   ANTERIOR CERVICAL DECOMP/DISCECTOMY FUSION N/A 11/08/2014   Procedure: Cervical three-four,  Cervical six-seven anterior cervical decompression with fusion plating and bonegraft;  Surgeon: Rockey Peru, MD;  Location: [facility];  Service: Neurosurgery;  Laterality: N/A;  Cervical three-four,  Cervical six-seven anterior cervical decompression with fusion plating and bonegraft   BIOPSY THYROID      benign thyroid  nodule removal   CATARACT EXTRACTION, BILATERAL     COLONOSCOPY W/ BIOPSIES AND POLYPECTOMY     ENDOMETRIAL BIOPSY  09/09/1998   TUBAL LIGATION     VARICOSE VEIN SURGERY      Outpatient Medications Prior to Visit  Medication Sig Dispense Refill   ACCU-CHEK SOFTCLIX LANCETS lancets CHECK BLOOD SUGER UP TO 3 TIMES A DAY 100 each 0   Ascorbic Acid (VITAMIN C) 1000 MG tablet Take 1,000 mg by mouth daily.     aspirin  EC (SB LOW DOSE ASA EC) 81 MG tablet Take 1 tablet by mouth daily 90 tablet 1   famotidine  (PEPCID ) 20 MG tablet Take 20 mg by mouth daily.     GNP ULTICARE PEN NEEDLES 32G X 4 MM MISC AS DIRECTED DAILY 100 each 3  insulin  NPH-regular Human (NOVOLIN  70/30) (70-30) 100 UNIT/ML injection 45 U SQ qAM and 25 U SQ qSupper (Patient taking differently: 45 U SQ qAM and 25 U SQ qSupper) 10 mL 11   levothyroxine  (SYNTHROID ) 100 MCG tablet TAKE (1) TABLET DAILY BE- FORE BREAKFAST. 90 tablet 1   losartan  (COZAAR ) 100 MG tablet 1/2 TABLET ONCE A DAY FOR HIGH BLOOD PRESSURE     Multiple Vitamin (MULTIVITAMIN ADULT PO) Take by mouth daily.     Omega-3 Fatty Acids (FISH OIL PO) Take 1,000 mg by mouth daily.     Polyethylene Glycol 3350  (MIRALAX  PO) Take 1 Scoop by mouth daily.     risperiDONE  (RISPERDAL ) 0.5 MG tablet 1-2 tabs po once a day as needed for agitation 10 tablet 1   rosuvastatin  (CRESTOR ) 5 MG tablet TAKE ONE TABLET ONCE  DAILY FOR CHOLESTEROL 90 tablet 1   vitamin B-12 (CYANOCOBALAMIN) 1000 MCG tablet Take 1,000 mcg by mouth daily.     furosemide  (LASIX ) 40 MG tablet TAKE ONE TABLET TWICE DAILY FOR FLUID RETENTION/ANKLE SWELLING 180 tablet 1   No facility-administered medications prior to visit.    Allergies  Allergen Reactions   Detrol [Tolterodine]     Review of Systems As per HPI  PE:    05/27/2024    3:54 PM 05/27/2024    3:44 PM 05/06/2024    2:18 PM  Vitals with BMI  Height  5' 2   Weight  194 lbs 3 oz   BMI  35.51   Systolic 146 154 869  Diastolic 70 71 56  Pulse  69      Physical Exam  Gen: Alert, well appearing.  Patient is oriented to person, in general place and situation.  AFFECT: pleasant, alert, very hard of hearing Cardiovascular: Regular rhythm and rate with soft systolic murmur.  No diastolic murmur. Lungs are clear bilaterally, nonlabored breathing. Extremities show 3+ pitting edema in both lower legs.  LABS:  Last CBC Lab Results  Component Value Date   WBC 12.1 (H) 01/08/2024   HGB 12.4 01/08/2024   HCT 39.4 01/08/2024   MCV 91.1 01/08/2024   MCH 28.3 07/08/2023   RDW 14.2 01/08/2024   PLT 368.0 01/08/2024   Last metabolic panel Lab Results  Component Value Date   GLUCOSE 232 (H) 04/08/2024   NA 136 04/08/2024   K 4.4 04/08/2024   CL 94 (L) 04/08/2024   CO2 33 (H) 04/08/2024   BUN 32 (H) 04/08/2024   CREATININE 1.48 (H) 04/08/2024   GFR 30.75 (L) 04/08/2024   CALCIUM  8.7 04/08/2024   PROT 6.6 01/08/2024   ALBUMIN 4.0 01/08/2024   BILITOT 0.3 01/08/2024   ALKPHOS 49 01/08/2024   AST 14 01/08/2024   ALT 11 01/08/2024   ANIONGAP 10 09/05/2016   Last lipids Lab Results  Component Value Date   CHOL 158 07/08/2023   HDL 57 07/08/2023   LDLCALC 72 07/08/2023   TRIG 194 (H) 07/08/2023   CHOLHDL 2.8 07/08/2023   Last hemoglobin A1c Lab Results  Component Value Date   HGBA1C 10.4 (A) 04/08/2024   HGBA1C 10.4 04/08/2024   HGBA1C 10.4 (A)  04/08/2024   HGBA1C 10.4 (A) 04/08/2024   Last thyroid  functions Lab Results  Component Value Date   TSH 2.31 07/08/2023   Last vitamin D  Lab Results  Component Value Date   VD25OH 97 04/07/2023   Last vitamin B12 and Folate Lab Results  Component Value Date   VITAMINB12 >2000 (H) 01/03/2017  IMPRESSION AND PLAN:  1.  Diabetes with nephropathy, poor control. Dietary habits make it very hard to adjust insulins.  Our main focus will be to avoid extremes. For now, continue 40 units of 70/30 insulin  every morning and 30 units every evening. Hemoglobin A1c goal is around 8%. Next A1c due around 07/09/24.   2.  Chronic renal insufficiency stage IIIb. We decreased her losartan  to 50 mg a day about 2 months ago. Basic metabolic panel today.   3 hypertension, remaining pretty well-controlled on losartan  50 mg a day. Monitor electrolytes and creatinine today.  #4 bilateral lower extremity edema.  She has venous insufficiency. Continue 80 mg Lasix  in the morning and add 40 mg in afternoon. Basic metabolic panel today.  An After Visit Summary was printed and given to the patient.  FOLLOW UP: Return in about 6 weeks (around 07/08/2024) for routine chronic illness f/u.  Signed:  Gerlene Hockey, MD           05/27/2024

## 2024-05-28 LAB — BASIC METABOLIC PANEL WITH GFR
BUN: 39 mg/dL — ABNORMAL HIGH (ref 6–23)
CO2: 34 meq/L — ABNORMAL HIGH (ref 19–32)
Calcium: 8.8 mg/dL (ref 8.4–10.5)
Chloride: 95 meq/L — ABNORMAL LOW (ref 96–112)
Creatinine, Ser: 1.45 mg/dL — ABNORMAL HIGH (ref 0.40–1.20)
GFR: 31.49 mL/min — ABNORMAL LOW (ref >60.00)
Glucose, Bld: 336 mg/dL — ABNORMAL HIGH (ref 70–99)
Potassium: 4.2 meq/L (ref 3.5–5.1)
Sodium: 136 meq/L (ref 135–145)

## 2024-05-30 ENCOUNTER — Ambulatory Visit: Payer: Self-pay | Admitting: Family Medicine

## 2024-06-02 ENCOUNTER — Telehealth: Payer: Self-pay

## 2024-06-02 NOTE — Telephone Encounter (Signed)
 Copied from CRM #[id number]. Topic: General - Other >> Jun 02, 2024  9:50 AM Chiquita SQUIBB wrote: Reason for CRM: Patients son is calling in requesting a assessment form filled out to get his mother into a nursing home. Please advise Tommy at [phone]   Spoke with pt's son, he is in the process of getting medicaid thru [employer]. The current plan is for her to go to [facility] in [place]. He is aware the form should be ready for pick up by Monday.

## 2024-06-04 ENCOUNTER — Telehealth: Payer: Self-pay

## 2024-06-04 ENCOUNTER — Other Ambulatory Visit: Payer: Self-pay

## 2024-06-04 ENCOUNTER — Encounter (HOSPITAL_COMMUNITY): Payer: Self-pay

## 2024-06-04 ENCOUNTER — Emergency Department (HOSPITAL_COMMUNITY)

## 2024-06-04 ENCOUNTER — Emergency Department (HOSPITAL_COMMUNITY)
Admission: EM | Admit: 2024-06-04 | Discharge: 2024-06-04 | Disposition: A | Discharge status: Home / Self Care | Attending: Emergency Medicine | Admitting: Emergency Medicine

## 2024-06-04 DIAGNOSIS — R410 Disorientation, unspecified: Secondary | ICD-10-CM | POA: Diagnosis present

## 2024-06-04 DIAGNOSIS — Z794 Long term (current) use of insulin: Secondary | ICD-10-CM | POA: Insufficient documentation

## 2024-06-04 DIAGNOSIS — W19XXXA Unspecified fall, initial encounter: Secondary | ICD-10-CM | POA: Diagnosis not present

## 2024-06-04 DIAGNOSIS — R296 Repeated falls: Secondary | ICD-10-CM

## 2024-06-04 DIAGNOSIS — R531 Weakness: Secondary | ICD-10-CM

## 2024-06-04 DIAGNOSIS — M25561 Pain in right knee: Secondary | ICD-10-CM | POA: Diagnosis not present

## 2024-06-04 DIAGNOSIS — M25551 Pain in right hip: Secondary | ICD-10-CM | POA: Diagnosis not present

## 2024-06-04 DIAGNOSIS — R4182 Altered mental status, unspecified: Secondary | ICD-10-CM | POA: Diagnosis not present

## 2024-06-04 DIAGNOSIS — M25562 Pain in left knee: Secondary | ICD-10-CM | POA: Insufficient documentation

## 2024-06-04 DIAGNOSIS — E162 Hypoglycemia, unspecified: Secondary | ICD-10-CM | POA: Diagnosis not present

## 2024-06-04 DIAGNOSIS — D72829 Elevated white blood cell count, unspecified: Secondary | ICD-10-CM | POA: Diagnosis not present

## 2024-06-04 DIAGNOSIS — M25552 Pain in left hip: Secondary | ICD-10-CM | POA: Insufficient documentation

## 2024-06-04 DIAGNOSIS — Z7409 Other reduced mobility: Secondary | ICD-10-CM

## 2024-06-04 LAB — URINALYSIS, W/ REFLEX TO CULTURE (INFECTION SUSPECTED)
Bilirubin Urine: NEGATIVE
Glucose, UA: NEGATIVE mg/dL
Hgb urine dipstick: NEGATIVE
Ketones, ur: NEGATIVE mg/dL
Nitrite: NEGATIVE
Protein, ur: 100 mg/dL — AB
Specific Gravity, Urine: 1.008 (ref 1.005–1.030)
pH: 7 (ref 5.0–8.0)

## 2024-06-04 LAB — COMPREHENSIVE METABOLIC PANEL WITH GFR
ALT: 24 U/L (ref 0–44)
AST: 29 U/L (ref 15–41)
Albumin: 3.7 g/dL (ref 3.5–5.0)
Alkaline Phosphatase: 66 U/L (ref 38–126)
Anion gap: 12 (ref 5–15)
BUN: 37 mg/dL — ABNORMAL HIGH (ref 8–23)
CO2: 29 mmol/L (ref 22–32)
Calcium: 8.9 mg/dL (ref 8.9–10.3)
Chloride: 99 mmol/L (ref 98–111)
Creatinine, Ser: 1.34 mg/dL — ABNORMAL HIGH (ref 0.44–1.00)
GFR, Estimated: 37 mL/min — ABNORMAL LOW (ref >60)
Glucose, Bld: 93 mg/dL (ref 70–99)
Potassium: 3.6 mmol/L (ref 3.5–5.1)
Sodium: 140 mmol/L (ref 135–145)
Total Bilirubin: 0.9 mg/dL (ref 0.0–1.2)
Total Protein: 7.3 g/dL (ref 6.5–8.1)

## 2024-06-04 LAB — CBC WITH DIFFERENTIAL/PLATELET
Abs Immature Granulocytes: 0.12 K/uL — ABNORMAL HIGH (ref 0.00–0.07)
Basophils Absolute: 0.1 K/uL (ref 0.0–0.1)
Basophils Relative: 0 %
Eosinophils Absolute: 0.1 K/uL (ref 0.0–0.5)
Eosinophils Relative: 1 %
HCT: 38 % (ref 36.0–46.0)
Hemoglobin: 11.7 g/dL — ABNORMAL LOW (ref 12.0–15.0)
Immature Granulocytes: 1 %
Lymphocytes Relative: 8 %
Lymphs Abs: 1.2 K/uL (ref 0.7–4.0)
MCH: 27.5 pg (ref 26.0–34.0)
MCHC: 30.8 g/dL (ref 30.0–36.0)
MCV: 89.2 fL (ref 80.0–100.0)
Monocytes Absolute: 1.2 K/uL — ABNORMAL HIGH (ref 0.1–1.0)
Monocytes Relative: 8 %
Neutro Abs: 12.5 K/uL — ABNORMAL HIGH (ref 1.7–7.7)
Neutrophils Relative %: 82 %
Platelets: 316 K/uL (ref 150–400)
RBC: 4.26 MIL/uL (ref 3.87–5.11)
RDW: 13.7 % (ref 11.5–15.5)
WBC: 15.2 K/uL — ABNORMAL HIGH (ref 4.0–10.5)
nRBC: 0 % (ref 0.0–0.2)

## 2024-06-04 LAB — CBG MONITORING, ED
Glucose-Capillary: 115 mg/dL — ABNORMAL HIGH (ref 70–99)
Glucose-Capillary: 102 mg/dL — ABNORMAL HIGH (ref 70–99)

## 2024-06-04 MED ORDER — ACETAMINOPHEN 325 MG PO TABS
650.0000 mg | ORAL_TABLET | Freq: Once | ORAL | Status: AC
  Administered 2024-06-04: 650 mg via ORAL
  Filled 2024-06-04: qty 2

## 2024-06-04 NOTE — Telephone Encounter (Signed)
 Placed on PCP desk to review and complete. CMA will complete medication section upon return to CMA work basket

## 2024-06-04 NOTE — ED Provider Notes (Signed)
 [place] EMERGENCY DEPARTMENT AT [facility]  Provider Note  CSN: [id number] Arrival date & time: 06/04/24 0357  History Chief Complaint  Patient presents with   Catherine Wood is a 89 y.o. female brought to the ED via EMS from home after a witnessed fall by son. EMS reports she has had recent nosebleed and woke up during the night with another. While helping her to the bathroom, she became weak and he lowered her to the floor. No reported impact. She was weak and confused and unable to get up on her own. EMS reports initial glucose was 49, improved with D50 and more alert. She denies any pains.    Home Medications Prior to Admission medications   Medication Sig Start Date End Date Taking? Authorizing Provider  ACCU-CHEK SOFTCLIX LANCETS lancets CHECK BLOOD SUGER UP TO 3 TIMES A DAY 06/12/17   Tonita Fallow, MD  Ascorbic Acid (VITAMIN C) 1000 MG tablet Take 1,000 mg by mouth daily.    [provider]  aspirin  EC (SB LOW DOSE ASA EC) 81 MG tablet Take 1 tablet by mouth daily 01/08/24   McGowen, Aleene DEL, MD  famotidine  (PEPCID ) 20 MG tablet Take 20 mg by mouth daily.    [provider]  furosemide  (LASIX ) 40 MG tablet 2 tabs p.o. every morning and 1 tab p.o. every afternoon 05/27/24   McGowen, Aleene DEL, MD  GNP ULTICARE PEN NEEDLES 32G X 4 MM MISC AS DIRECTED DAILY 01/13/23   Laurice President, NP  insulin  NPH-regular Human (NOVOLIN  70/30) (70-30) 100 UNIT/ML injection 45 U SQ qAM and 25 U SQ qSupper Patient taking differently: 45 U SQ qAM and 25 U SQ qSupper 04/08/24   McGowen, Aleene DEL, MD  levothyroxine  (SYNTHROID ) 100 MCG tablet TAKE (1) TABLET DAILY BE- FORE BREAKFAST. 01/08/24   McGowen, Aleene DEL, MD  losartan  (COZAAR ) 100 MG tablet 1/2 TABLET ONCE A DAY FOR HIGH BLOOD PRESSURE 02/05/24   McGowen, Aleene DEL, MD  Multiple Vitamin (MULTIVITAMIN ADULT PO) Take by mouth daily.    [provider]  Omega-3 Fatty Acids (FISH OIL PO) Take 1,000 mg by  mouth daily.    [provider]  Polyethylene Glycol 3350  (MIRALAX  PO) Take 1 Scoop by mouth daily.    [provider]  risperiDONE  (RISPERDAL ) 0.5 MG tablet 1-2 tabs po once a day as needed for agitation 01/08/24   McGowen, Aleene DEL, MD  rosuvastatin  (CRESTOR ) 5 MG tablet TAKE ONE TABLET ONCE DAILY FOR CHOLESTEROL 01/08/24   McGowen, Aleene DEL, MD  vitamin B-12 (CYANOCOBALAMIN) 1000 MCG tablet Take 1,000 mcg by mouth daily.    [provider]     Allergies    Detrol [tolterodine]   Review of Systems   Review of Systems Please see HPI for pertinent positives and negatives  Physical Exam BP (!) 175/81   Pulse 72   Temp 97.8 F (36.6 C)   Resp 15   Ht 5' 2 (1.575 m)   Wt 88.1 kg   SpO2 97%   BMI 35.52 kg/m   Physical Exam Vitals and nursing note reviewed.  Constitutional:      Appearance: Normal appearance.  HENT:     Head: Normocephalic and atraumatic.     Nose: Nose normal.     Mouth/Throat:     Mouth: Mucous membranes are moist.  Eyes:     Extraocular Movements: Extraocular movements intact.     Conjunctiva/sclera: Conjunctivae normal.  Neck:     Comments: In c-collar Cardiovascular:     Rate and Rhythm: Normal rate.     Pulses: Normal pulses.  Pulmonary:     Effort: Pulmonary effort is normal.     Breath sounds: Normal breath sounds.  Abdominal:     General: Abdomen is flat.     Palpations: Abdomen is soft.     Tenderness: There is no abdominal tenderness.  Musculoskeletal:        General: Tenderness (reports pain with ROM of bilateral knees/hips) present. No swelling or deformity. Normal range of motion.  Skin:    General: Skin is warm and dry.  Neurological:     General: No focal deficit present.     Mental Status: She is alert.  Psychiatric:        Mood and Affect: Mood normal.     ED Results / Procedures / Treatments   EKG EKG Interpretation Date/Time:  Friday June 04 2024 04:17:28 EDT Ventricular Rate:  70 PR  Interval:    QRS Duration:  158 QT Interval:  458 QTC Calculation: 495 R Axis:   123  Text Interpretation: Atrial fibrillation Ventricular premature complex Right bundle branch block Since last tracing Atrial fibrillation has replaced Sinus rhythm Confirmed by Roselyn Dunnings [phone]) on 06/04/2024 4:18:54 AM  Procedures Procedures  Medications Ordered in the ED Medications  acetaminophen  (TYLENOL ) tablet 650 mg (has no administration in time range)    Initial Impression and Plan  Patient here with reported fall/assisted to the floor after an episode of weakness presumably related to hypoglycemia. She is on insulin  among other medications. No obvious injuries by exam, but reports pain with ROM of BLE. Will check labs, EKG, and send for imaging. Monitor glucose.   ED Course   Clinical Course as of 06/04/24 0709  Fri Jun 04, 2024  0441 Spoke with son who reports the patient had gotten up and gone to the bathroom on her own around 0100 but was in there for a prolonged time and was 'talking out of her head'. She collapsed when he tried to get her back to bed which is when EMS was called. She has been in her usual state of health recently otherwise. He reports she fell out of bed about 2 weeks but other than some bruising did not appear to have any injuries and was not seen then.  [CS]  0548 I personally viewed the images from radiology studies and agree with radiologist interpretation: Xrays neg for injury, arthritis likely cause of her reported pain. CT neg for injury as well.  [CS]  0559 CBC with leukocytosis.  [CS]  0618 CMP is unremarkable.  [CS]  0641 Glucose remains adequate.  [CS]  0656 Patient resting comfortably in bed. Awaiting UA results.  [CS]  0702 UA is clear, no signs of infection. Son is no longer at bedside and his phone goes to voicemail. She has no indication for admission at this time. Plan discharge when he returns.  [CS]    Clinical Course User Index [CS] Roselyn Dunnings NOVAK, MD     MDM Rules/Calculators/A&P Medical Decision Making Given presenting complaint, I considered that admission might be necessary. After review of results from ED lab and/or imaging studies, admission to the hospital is not indicated at this time.    Problems Addressed: Altered mental status, unspecified altered mental status type: acute illness or injury Fall, initial encounter: acute illness or injury Hypoglycemia: acute illness or injury  Amount and/or Complexity  of Data Reviewed Labs: ordered. Decision-making details documented in ED Course. Radiology: ordered and independent interpretation performed. Decision-making details documented in ED Course. ECG/medicine tests: ordered and independent interpretation performed. Decision-making details documented in ED Course.  Risk OTC drugs. Decision regarding hospitalization.     Final Clinical Impression(s) / ED Diagnoses Final diagnoses:  Fall, initial encounter  Altered mental status, unspecified altered mental status type  Hypoglycemia    Rx / DC Orders ED Discharge Orders     None        Roselyn Carlin NOVAK, MD 06/04/24 [phone]

## 2024-06-04 NOTE — Telephone Encounter (Signed)
 Signed and put in box to go up front. Signed:  Gerlene Hockey, MD           06/04/2024

## 2024-06-04 NOTE — ED Notes (Signed)
 Family member at bedside while awaiting transportation from pt son still.

## 2024-06-04 NOTE — ED Notes (Signed)
 Spoke with pt son. States he is going home to get [place] and wheelchair to pick up pt and transport home. Son states he should be back in about an hour.

## 2024-06-04 NOTE — ED Notes (Signed)
 Patient transported to CT

## 2024-06-04 NOTE — Telephone Encounter (Signed)
 Did the patient discuss referral with their provider in the last year? No    (If No - schedule appointment)    (If Yes - send message)        Appointment offered? No        Type of order/referral and detailed reason for visit: Patient would like a referral to [facility]. She was recently released from the hospital today due to a fall.        Preference of office, provider, location:    [facility]    [address], [place], [place] [address]    [phone]        If referral order, have you been seen by this specialty before? No    (If Yes, this issue or another issue? When? Where?        Can we respond through MyChart? No    Please call the patient's son with an update:    Catherine Finnigan (John) / [phone]

## 2024-06-04 NOTE — Telephone Encounter (Signed)
 Reason for CRM: Patient's son is calling in to see if Dr. McGowen could make an order for an at home hospital bed. He was wondering if there were any medical suppliers that would allow him to rent the equipment for time being.   DME order pending, please advise on additional details in pending order

## 2024-06-04 NOTE — ED Triage Notes (Signed)
 [facility] from home . Cc of a fall initially. Patient has dementia and woke up with a nose bleed, was walking in the hall when she started to fall, her son helped her down.  She wasn't very responsive with ems, they checked her sugar and it was 49. They gave D50 in a 20 r ac and it went up to 220. At 8pm son gave her 25u of short acting for a sugar of 103.

## 2024-06-07 NOTE — Telephone Encounter (Signed)
 FL2 form has been completed and signed by provider. Copy has been made to go in patient chart. Patient son will pick form before office closes. Patient son was informed that office will close at 5pm.

## 2024-06-08 NOTE — Telephone Encounter (Signed)
 Spoke with pt's son [name], he picked up the FL2 form yesterday. He will come by the end of the week to get DME prescription for hospital bed.

## 2024-06-08 NOTE — Telephone Encounter (Signed)
 I do not know anything about the renting vs buying process for these. Prescription completed.

## 2024-06-29 ENCOUNTER — Other Ambulatory Visit: Payer: Self-pay | Admitting: Family Medicine

## 2024-06-29 DIAGNOSIS — I10 Essential (primary) hypertension: Secondary | ICD-10-CM

## 2024-06-29 DIAGNOSIS — Z79899 Other long term (current) drug therapy: Secondary | ICD-10-CM

## 2024-06-29 DIAGNOSIS — E1169 Type 2 diabetes mellitus with other specified complication: Secondary | ICD-10-CM

## 2024-07-09 ENCOUNTER — Ambulatory Visit: Admitting: Family Medicine

## 2024-07-09 ENCOUNTER — Encounter: Payer: Self-pay | Admitting: Family Medicine

## 2024-07-09 VITALS — BP 130/72 | HR 68 | Temp 97.2°F | Ht 62.0 in | Wt 203.4 lb

## 2024-07-09 DIAGNOSIS — E1122 Type 2 diabetes mellitus with diabetic chronic kidney disease: Secondary | ICD-10-CM

## 2024-07-09 DIAGNOSIS — E1169 Type 2 diabetes mellitus with other specified complication: Secondary | ICD-10-CM

## 2024-07-09 DIAGNOSIS — E1121 Type 2 diabetes mellitus with diabetic nephropathy: Secondary | ICD-10-CM

## 2024-07-09 DIAGNOSIS — Z794 Long term (current) use of insulin: Secondary | ICD-10-CM | POA: Diagnosis not present

## 2024-07-09 DIAGNOSIS — Z79899 Other long term (current) drug therapy: Secondary | ICD-10-CM | POA: Diagnosis not present

## 2024-07-09 DIAGNOSIS — E11649 Type 2 diabetes mellitus with hypoglycemia without coma: Secondary | ICD-10-CM

## 2024-07-09 DIAGNOSIS — N2889 Other specified disorders of kidney and ureter: Secondary | ICD-10-CM

## 2024-07-09 DIAGNOSIS — I10 Essential (primary) hypertension: Secondary | ICD-10-CM

## 2024-07-09 DIAGNOSIS — N1832 Chronic kidney disease, stage 3b: Secondary | ICD-10-CM

## 2024-07-09 LAB — POCT GLYCOSYLATED HEMOGLOBIN (HGB A1C)
HbA1c POC (<> result, manual entry): 8.3 % (ref 4.0–5.6)
HbA1c, POC (controlled diabetic range): 8.3 % — AB (ref 0.0–7.0)
HbA1c, POC (prediabetic range): 8.3 % — AB (ref 5.7–6.4)
Hemoglobin A1C: 8.3 % — AB (ref 4.0–5.6)

## 2024-07-09 MED ORDER — LEVOTHYROXINE SODIUM 100 MCG PO TABS: ORAL_TABLET | ORAL | 3 refills | Status: AC

## 2024-07-09 MED ORDER — INSULIN LISPRO (1 UNIT DIAL) 100 UNIT/ML (KWIKPEN): PEN_INJECTOR | SUBCUTANEOUS | 6 refills | Status: DC

## 2024-07-09 MED ORDER — FUROSEMIDE 40 MG PO TABS: ORAL_TABLET | ORAL | 3 refills | Status: AC

## 2024-07-09 MED ORDER — ASPIRIN 81 MG PO TBEC: DELAYED_RELEASE_TABLET | ORAL | 1 refills | Status: AC

## 2024-07-09 MED ORDER — ROSUVASTATIN CALCIUM 5 MG PO TABS: 5.0000 mg | ORAL_TABLET | Freq: Every day | ORAL | 3 refills | Status: AC

## 2024-07-09 MED ORDER — LANTUS SOLOSTAR 100 UNIT/ML ~~LOC~~ SOPN: PEN_INJECTOR | SUBCUTANEOUS | 6 refills | Status: AC

## 2024-07-09 NOTE — Patient Instructions (Signed)
 Mealtime insulin  (Humalog ) instructions:  Check glucose prior to each meal: CBG 121 - 150: 2 units  CBG 151 - 200: 3 units  CBG 201 - 250: 5 units  CBG 251 - 300: 8 units  CBG 301 - 350: 10 units  CBG 351 - 400: 12 units

## 2024-07-09 NOTE — Progress Notes (Signed)
 OFFICE VISIT  07/09/2024  CC:  Chief Complaint  Patient presents with   Medical Management of Chronic Issues    Patient is a 89 y.o. female who presents companied by her son for 6 wk f/u DM, HTN, edema. A/P as of last visit: 1.  Diabetes with nephropathy, poor control. Dietary habits make it very hard to adjust insulins.  Our main focus will be to avoid extremes. For now, continue 40 units of 70/30 insulin  every morning and 30 units every evening. Hemoglobin A1c goal is around 8%. Next A1c due around 07/09/24.   2.  Chronic renal insufficiency stage IIIb. We decreased her losartan  to 50 mg a day about 2 months ago. Basic metabolic panel today.   3 hypertension, remaining pretty well-controlled on losartan  50 mg a day. Monitor electrolytes and creatinine today.   #4 bilateral lower extremity edema.  She has venous insufficiency. Continue 80 mg Lasix  in the morning and add 40 mg in afternoon. Basic metabolic panel today.  INTERIM HX: Hypoglycemic episode led to a fall (no impact) and ED visit 1 mo ago.  Glucoses very erratic since last visit.  Seem to go low more in the middle of the night but also sometimes earlier in the day.  Novolin  70/30 40 units in the morning and 30 units in the evening. Her insulin  is often held because of your normal blood sugar but then later in the day it is in the 400s.  She eats very well.  Diet has excessive sodium.  ROS: no SOB, CP, dizziness, abd pain, fever, or focal weakness.  Past Medical History:  Diagnosis Date   Anxiety and depression    Arthritis    Chronic constipation    Chronic renal insufficiency, stage 3 (moderate)    Combined visual and hearing impairment    Dementia (HCC)    ? vascular   Diabetic peripheral neuropathy (HCC)    duloxetine    DOE (dyspnea on exertion)    GERD (gastroesophageal reflux disease)    Hyperlipidemia    Hypertension    Hypothyroidism    IDDM (insulin  dependent diabetes mellitus)    Leg  swelling    Lumbar spondylosis    w/mild stenosis   Macular degeneration    Obesity, class 2    OSA (obstructive sleep apnea)    does not wear cpap   Proximal humerus fracture    2024, proximal right   Varicose veins    Vitamin D  deficiency     Past Surgical History:  Procedure Laterality Date   ANTERIOR CERVICAL DECOMP/DISCECTOMY FUSION N/A 11/08/2014   Procedure: Cervical three-four,  Cervical six-seven anterior cervical decompression with fusion plating and bonegraft;  Surgeon: Rockey Peru, MD;  Location: [facility];  Service: Neurosurgery;  Laterality: N/A;  Cervical three-four,  Cervical six-seven anterior cervical decompression with fusion plating and bonegraft   BIOPSY THYROID      benign thyroid  nodule removal   CATARACT EXTRACTION, BILATERAL     COLONOSCOPY W/ BIOPSIES AND POLYPECTOMY     ENDOMETRIAL BIOPSY  09/09/1998   TUBAL LIGATION     VARICOSE VEIN SURGERY      Outpatient Medications Prior to Visit  Medication Sig Dispense Refill   ACCU-CHEK SOFTCLIX LANCETS lancets CHECK BLOOD SUGER UP TO 3 TIMES A DAY 100 each 0   Ascorbic Acid (VITAMIN C) 1000 MG tablet Take 1,000 mg by mouth daily.     aspirin  EC (SB LOW DOSE ASA EC) 81 MG tablet Take 1 tablet by  mouth daily 90 tablet 1   famotidine  (PEPCID ) 20 MG tablet Take 20 mg by mouth daily.     furosemide  (LASIX ) 40 MG tablet TAKE ONE TABLET TWICE DAILY FOR fluid/swelling 60 tablet 0   GNP ULTICARE PEN NEEDLES 32G X 4 MM MISC AS DIRECTED DAILY 100 each 3   insulin  NPH-regular Human (NOVOLIN  70/30) (70-30) 100 UNIT/ML injection 45 U SQ qAM and 25 U SQ qSupper (Patient taking differently: 45 U SQ qAM and 25 U SQ qSupper) 10 mL 11   levothyroxine  (SYNTHROID ) 100 MCG tablet TAKE (1) TABLET DAILY BE- FORE BREAKFAST. 90 tablet 1   losartan  (COZAAR ) 100 MG tablet 1/2 TABLET ONCE A DAY FOR HIGH BLOOD PRESSURE     Multiple Vitamin (MULTIVITAMIN ADULT PO) Take by mouth daily.     Omega-3 Fatty Acids (FISH OIL PO) Take 1,000 mg  by mouth daily.     Polyethylene Glycol 3350  (MIRALAX  PO) Take 1 Scoop by mouth daily.     risperiDONE  (RISPERDAL ) 0.5 MG tablet 1-2 tabs po once a day as needed for agitation 10 tablet 1   rosuvastatin  (CRESTOR ) 5 MG tablet TAKE ONE TABLET ONCE DAILY 30 tablet 0   vitamin B-12 (CYANOCOBALAMIN) 1000 MCG tablet Take 1,000 mcg by mouth daily.     No facility-administered medications prior to visit.    Allergies  Allergen Reactions   Detrol [Tolterodine]     Review of Systems As per HPI  PE:    07/09/2024    2:59 PM 07/09/2024    2:49 PM 06/04/2024    8:36 AM  Vitals with BMI  Height  5' 2   Weight  203 lbs 6 oz   BMI  37.19   Systolic 130 149 829  Diastolic 72 71 67  Pulse  68 70     Physical Exam  Gen: alert, sitting in WC, very hard of hearing but attempts to be appropriately interactive. CV: RRR, 3/6 syst murm, no diastolic murm, no rub EXT: 4+ bilat LL pitting edema  LABS:  Last CBC Lab Results  Component Value Date   WBC 15.2 (H) 06/04/2024   HGB 11.7 (L) 06/04/2024   HCT 38.0 06/04/2024   MCV 89.2 06/04/2024   MCH 27.5 06/04/2024   RDW 13.7 06/04/2024   PLT 316 06/04/2024   Last metabolic panel Lab Results  Component Value Date   GLUCOSE 93 06/04/2024   NA 140 06/04/2024   K 3.6 06/04/2024   CL 99 06/04/2024   CO2 29 06/04/2024   BUN 37 (H) 06/04/2024   CREATININE 1.34 (H) 06/04/2024   GFRNONAA 37 (L) 06/04/2024   CALCIUM  8.9 06/04/2024   PROT 7.3 06/04/2024   ALBUMIN 3.7 06/04/2024   BILITOT 0.9 06/04/2024   ALKPHOS 66 06/04/2024   AST 29 06/04/2024   ALT 24 06/04/2024   ANIONGAP 12 06/04/2024   Lab Results  Component Value Date   TSH 2.31 07/08/2023   Lab Results  Component Value Date   HGBA1C 10.4 (A) 04/08/2024   HGBA1C 10.4 04/08/2024   HGBA1C 10.4 (A) 04/08/2024   HGBA1C 10.4 (A) 04/08/2024   IMPRESSION AND PLAN:  1.  Diabetes with nephropathy, poor control.  A1c today is 8.3%. Dietary habits make it very hard to adjust  insulins.  Our main focus will be to avoid extremes. We will stop 70/30 insulin  and switch back to basal bolus regimen--> Lantus  50 units daily and NovoLog  per sliding scale at meals (see patient instructions for details).  Hemoglobin A1c goal is around 8%.   2.  Chronic renal insufficiency stage IIIb. We decreased her losartan  to 50 mg a day about 2 months ago. Cr about 1 mo ago was 1.34, stable.  Lytes normal at that time.   3 hypertension, remaining pretty well-controlled on losartan  50 mg a day.   #4 bilateral lower extremity edema.  She has venous insufficiency. Continue 80 mg Lasix  in the morning and 40 mg in afternoon. Na limitation and elevation encouraged.  An After Visit Summary was printed and given to the patient.  FOLLOW UP: No follow-ups on file.  Signed:  Gerlene Hockey, MD           07/09/2024

## 2024-07-12 ENCOUNTER — Telehealth: Payer: Self-pay

## 2024-07-12 ENCOUNTER — Other Ambulatory Visit (HOSPITAL_COMMUNITY): Payer: Self-pay

## 2024-07-12 NOTE — Telephone Encounter (Signed)
 Pharmacy Patient Advocate Encounter   Received notification from CoverMyMeds that prior authorization for  Insulin  Glargine-yfgn 100UNIT/ML pen-injectors,  is required/requested.   Insurance verification completed.   The patient is insured through [facility].  PLEASE BE ADVISED PLAN HAS PREFERRED THE BRAND LANTUS   Per test claim: The current 30 day co-pay is, $35.00.  No PA needed at this time.

## 2024-07-20 ENCOUNTER — Encounter: Payer: Self-pay | Admitting: Family Medicine

## 2024-07-20 ENCOUNTER — Ambulatory Visit: Payer: Self-pay

## 2024-07-20 ENCOUNTER — Ambulatory Visit: Admitting: Family Medicine

## 2024-07-20 VITALS — BP 140/88 | HR 63 | Temp 97.9°F | Wt 210.2 lb

## 2024-07-20 DIAGNOSIS — R600 Localized edema: Secondary | ICD-10-CM | POA: Diagnosis not present

## 2024-07-20 DIAGNOSIS — L97221 Non-pressure chronic ulcer of left calf limited to breakdown of skin: Secondary | ICD-10-CM | POA: Insufficient documentation

## 2024-07-20 DIAGNOSIS — I83022 Varicose veins of left lower extremity with ulcer of calf: Secondary | ICD-10-CM | POA: Diagnosis not present

## 2024-07-20 LAB — CBC WITH DIFFERENTIAL/PLATELET
Basophils Absolute: 0.1 K/uL (ref 0.0–0.1)
Basophils Relative: 0.7 % (ref 0.0–3.0)
Eosinophils Absolute: 0.3 K/uL (ref 0.0–0.7)
Eosinophils Relative: 3.1 % (ref 0.0–5.0)
HCT: 33.5 % — ABNORMAL LOW (ref 36.0–46.0)
Hemoglobin: 11 g/dL — ABNORMAL LOW (ref 12.0–15.0)
Lymphocytes Relative: 12.9 % (ref 12.0–46.0)
Lymphs Abs: 1.2 K/uL (ref 0.7–4.0)
MCHC: 32.9 g/dL (ref 30.0–36.0)
MCV: 82.5 fl (ref 78.0–100.0)
Monocytes Absolute: 1 K/uL (ref 0.1–1.0)
Monocytes Relative: 11 % (ref 3.0–12.0)
Neutro Abs: 6.8 K/uL (ref 1.4–7.7)
Neutrophils Relative %: 72.3 % (ref 43.0–77.0)
Platelets: 307 K/uL (ref 150.0–400.0)
RBC: 4.06 Mil/uL (ref 3.87–5.11)
RDW: 14.9 % (ref 11.5–15.5)
WBC: 9.4 K/uL (ref 4.0–10.5)

## 2024-07-20 LAB — BASIC METABOLIC PANEL WITH GFR
BUN: 22 mg/dL (ref 6–23)
CO2: 34 meq/L — ABNORMAL HIGH (ref 19–32)
Calcium: 8.8 mg/dL (ref 8.4–10.5)
Chloride: 87 meq/L — ABNORMAL LOW (ref 96–112)
Creatinine, Ser: 1.32 mg/dL — ABNORMAL HIGH (ref 0.40–1.20)
GFR: 35.21 mL/min — ABNORMAL LOW (ref >60.00)
Glucose, Bld: 159 mg/dL — ABNORMAL HIGH (ref 70–99)
Potassium: 4.4 meq/L (ref 3.5–5.1)
Sodium: 130 meq/L — ABNORMAL LOW (ref 135–145)

## 2024-07-20 LAB — BRAIN NATRIURETIC PEPTIDE: Pro B Natriuretic peptide (BNP): 117 pg/mL — ABNORMAL HIGH (ref 0.0–100.0)

## 2024-07-20 MED ORDER — POTASSIUM CHLORIDE CRYS ER 20 MEQ PO TBCR: 20.0000 meq | EXTENDED_RELEASE_TABLET | Freq: Two times a day (BID) | ORAL | 0 refills | Status: DC

## 2024-07-20 MED ORDER — DOXYCYCLINE HYCLATE 100 MG PO TABS: 100.0000 mg | ORAL_TABLET | Freq: Two times a day (BID) | ORAL | 0 refills | Status: DC

## 2024-07-20 NOTE — Telephone Encounter (Signed)
 FYI Only or Action Required?: Action required by provider: request for appointment.  Patient was last seen in primary care on 07/09/2024 by McGowen, Aleene DEL, MD.  Called Nurse Triage reporting Leg Swelling.  Symptoms began several days ago.  Interventions attempted: Nothing.  Symptoms are: gradually worsening.Both legs swollen up to knees, weeping, painful.  Triage Disposition: See HCP Within 4 Hours (Or PCP Triage)  Patient/caregiver understands and will follow disposition?: Yes    Copied from CRM [id number]. Topic: Clinical - Red Word Triage >> Jul 20, 2024  8:40 AM Robinson DEL wrote: [facility] that prompted transfer to Nurse Triage: Both legs below knees blistering, swelling, draining doesn't look good Reason for Disposition  SEVERE leg swelling (e.g., swelling extends above knee, entire leg is swollen, weeping fluid)  Answer Assessment - Initial Assessment Questions 1. ONSET: When did the swelling start? (e.g., minutes, hours, days)     2 days 2. LOCATION: What part of the leg is swollen?  Are both legs swollen or just one leg?     both 3. SEVERITY: How bad is the swelling? (e.g., localized; mild, moderate, severe)     moderate 4. REDNESS: Is there redness or signs of infection?     yes 5. PAIN: Is the swelling painful to touch? If Yes, ask: How painful is it?   (Scale 1-10; mild, moderate or severe)     With touch 6. FEVER: Do you have a fever? If Yes, ask: What is it, how was it measured, and when did it start?      no 7. CAUSE: What do you think is causing the leg swelling?     unsure 8. MEDICAL HISTORY: Do you have a history of blood clots (e.g., DVT), cancer, heart failure, kidney disease, or liver failure?     no 9. RECURRENT SYMPTOM: Have you had leg swelling before? If Yes, ask: When was the last time? What happened that time?     yes 10. OTHER SYMPTOMS: Do you have any other symptoms? (e.g., chest pain, difficulty breathing)        weeping 11. PREGNANCY: Is there any chance you are pregnant? When was your last menstrual period?       no  Protocols used: Leg Swelling and Edema-A-AH

## 2024-07-20 NOTE — Progress Notes (Signed)
 Catherine Wood , [birth year], 89 y.o., female MRN: [id number] Patient Care Team    Relationship Specialty Notifications Start End  McGowen, Aleene DEL, MD PCP - General Family Medicine  01/08/24   Rosalie Kitchens, MD Consulting Physician Gastroenterology  04/03/14   Pietro Redell RAMAN, MD Consulting Physician Cardiology  04/03/14   Cleatus Collar, MD Consulting Physician Ophthalmology  04/03/14   Elner Arley LABOR, MD Consulting Physician Ophthalmology  04/03/14   Neysa Reggy BIRCH, MD Consulting Physician Pulmonary Disease  04/03/14    Comment: Negative sleep study 2012  Kathyrn Braun, [facility] (Inactive) Pharmacist Pharmacist  12/17/22     Chief Complaint  Patient presents with   Leg Swelling    Worsening over the last few days.      Subjective: Catherine Wood is a 89 y.o. Pt presents for an OV with her son and daughter with concerns of bilateral lower extremity swelling that has worsened over the last few days.  They report she has a history of venous insufficiency and bilateral lower extremity edema and prescribed Lasix .  They report she may have had a high salt content meal, she is known to get up and eat throughout the night when other members are sleeping.  She did have chicken stew a few times prior to onset of lower extremity edema that was high in salt. Family states that she has had this much edema in the past in her lower extremities, but she has not had some the areas of weeping blisters. They deny fever or chills. In review of medications, it is uncertain if she is taking her medications correctly.  Per review of PCP note losartan  has been decreased to 50 mg a day that few months ago, however per review today it sounds like she is still taking the full 100 mg tab? Lasix  dose was increased last visit to 80 mg in the morning and 40 mg in the afternoon.  It sounds like maybe he was given her 20 mg in the morning and  40 to 60 mg at night?      07/09/2024    2:59 PM 04/08/2024    1:41 PM  01/08/2024    1:58 PM 04/07/2023    8:36 PM 12/06/2021    4:01 PM  Depression screen PHQ 2/9  Decreased Interest 3 3 3  0 0  Down, Depressed, Hopeless 1 2 1  0 0  PHQ - 2 Score 4 5 4  0 0  Altered sleeping 0 2 3    Tired, decreased energy 3 2 3     Change in appetite 2 1 1     Feeling bad or failure about yourself  3 1 0    Trouble concentrating 3 1 3     Moving slowly or fidgety/restless 3 1 0    Suicidal thoughts 0 0 0    PHQ-9 Score 18  13  14      Difficult doing work/chores Very difficult Not difficult at all Extremely dIfficult       Data saved with a previous flowsheet row definition    Allergies  Allergen Reactions   Detrol [Tolterodine]    Social History   Social History Narrative   Lives with her son.   Originally from [place].   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  She was also a visual merchandiser.   Past Medical History:  Diagnosis Date   Advanced nonexudative age-related macular degeneration of both eyes with subfoveal involvement 01/10/2020  Anxiety and depression    Arthritis    Chronic constipation    Chronic renal insufficiency, stage 3 (moderate)    Combined visual and hearing impairment    Dementia (HCC)    ? vascular   Diabetic peripheral neuropathy (HCC)    duloxetine    DOE (dyspnea on exertion)    GERD (gastroesophageal reflux disease)    Hyperlipidemia    Hypertension    Hypothyroidism    IDDM (insulin  dependent diabetes mellitus)    Leg swelling    Lumbar spondylosis    w/mild stenosis   Macular degeneration    Obesity, class 2    OSA (obstructive sleep apnea)    does not wear cpap   Proximal humerus fracture    2024, proximal right   Varicose veins    Vitamin D  deficiency    Past Surgical History:  Procedure Laterality Date   ANTERIOR CERVICAL DECOMP/DISCECTOMY FUSION N/A 11/08/2014   Procedure: Cervical three-four,  Cervical six-seven anterior cervical decompression with fusion plating and bonegraft;   Surgeon: Rockey Peru, MD;  Location: [facility];  Service: Neurosurgery;  Laterality: N/A;  Cervical three-four,  Cervical six-seven anterior cervical decompression with fusion plating and bonegraft   BIOPSY THYROID      benign thyroid  nodule removal   CATARACT EXTRACTION, BILATERAL     COLONOSCOPY W/ BIOPSIES AND POLYPECTOMY     ENDOMETRIAL BIOPSY  09/09/1998   TUBAL LIGATION     VARICOSE VEIN SURGERY     Family History  Problem Relation Age of Onset   Cancer Mother        colon   Cancer Father        GI   Breast cancer Neg Hx    Allergies as of 07/20/2024       Reactions   Detrol [tolterodine]         Medication List        Accurate as of July 20, 2024 12:32 PM. If you have any questions, ask your nurse or doctor.          STOP taking these medications    MIRALAX  PO Stopped by: Charlies Bellini   vitamin C 1000 MG tablet Stopped by: Charlies Bellini       TAKE these medications    Accu-Chek Softclix Lancets lancets CHECK BLOOD SUGER UP TO 3 TIMES A DAY   aspirin  EC 81 MG tablet Commonly known as: SB Low Dose ASA EC Take 1 tablet by mouth daily   cyanocobalamin 1000 MCG tablet Commonly known as: VITAMIN B12 Take 1,000 mcg by mouth daily.   doxycycline  100 MG tablet Commonly known as: VIBRA -TABS Take 1 tablet (100 mg total) by mouth 2 (two) times daily. Started by: Charlies Bellini   famotidine  20 MG tablet Commonly known as: PEPCID  Take 20 mg by mouth daily.   FISH OIL PO Take 1,000 mg by mouth daily.   furosemide  40 MG tablet Commonly known as: LASIX  TAKE ONE TABLET TWICE DAILY FOR fluid/swelling   GNP UltiCare Pen Needles 32G X 4 MM Misc Generic drug: Insulin  Pen Needle AS DIRECTED DAILY   insulin  lispro 100 UNIT/ML KwikPen Commonly known as: HumaLOG  KwikPen 0-15 units SQ with tid with meals   Lantus  SoloStar 100 UNIT/ML Solostar Pen Generic drug: insulin  glargine 50 U SQ once a day   levothyroxine  100 MCG tablet Commonly known as:  SYNTHROID  TAKE (1) TABLET DAILY BE- FORE BREAKFAST.   losartan  100 MG tablet Commonly known as: COZAAR  1/2 TABLET ONCE  A DAY FOR HIGH BLOOD PRESSURE   MULTIVITAMIN ADULT PO Take by mouth daily.   potassium chloride  SA 20 MEQ tablet Commonly known as: KLOR-CON  M Take 1 tablet (20 mEq total) by mouth 2 (two) times daily. Started by: Nissi Doffing   risperiDONE  0.5 MG tablet Commonly known as: RisperDAL  1-2 tabs po once a day as needed for agitation   rosuvastatin  5 MG tablet Commonly known as: CRESTOR  Take 1 tablet (5 mg total) by mouth daily.        All past medical history, surgical history, allergies, family history, immunizations andmedications were updated in the EMR today and reviewed under the history and medication portions of their EMR.     ROS Negative, with the exception of above mentioned in HPI   Objective:  BP (!) 140/88   Pulse 63   Temp 97.9 F (36.6 C)   Wt 210 lb 3.2 oz (95.3 kg)   SpO2 98%   BMI 38.45 kg/m  Body mass index is 38.45 kg/m. Physical Exam Vitals and nursing note reviewed.  Constitutional:      General: She is not in acute distress.    Appearance: Normal appearance. She is normal weight. She is not ill-appearing or toxic-appearing.  HENT:     Head: Normocephalic and atraumatic.  Eyes:     General: No scleral icterus.       Right eye: No discharge.        Left eye: No discharge.     Extraocular Movements: Extraocular movements intact.     Conjunctiva/sclera: Conjunctivae normal.     Pupils: Pupils are equal, round, and reactive to light.  Musculoskeletal:        General: Swelling and tenderness present.     Comments: BLE: 2-3+ edema bilaterally.  Erythema feet to approximately 2 inches below knee.  Venous ulceration present left lateral calf.  Skin involvement only.  Multiple small bulla with drainage bilateral lower extremities.  Tenderness to palpation diffusely.  Skin warm and well perfused  Skin:    Findings: Erythema and  rash present.     Comments: 2 cm round ulceration left lateral calf, skin involvement only.  Neurological:     Mental Status: She is alert and oriented to person, place, and time. Mental status is at baseline.     Motor: No weakness.     Coordination: Coordination normal.     Gait: Gait normal.  Psychiatric:        Mood and Affect: Mood normal.        Behavior: Behavior normal.        Thought Content: Thought content normal.        Judgment: Judgment normal.      No results found. No results found. No results found for this or any previous visit (from the past 24 hours).  Assessment/Plan: Catherine Wood is a 89 y.o. female present for OV for  Bilateral lower extremity edema/venous stasis ulceration/hypertension Medication regimen was written on AVS and separately on medication list with instructions.  Reviewed each medication with her son. For now:    - Lasix  2 tabs in the morning and 2 tabs in the evening for 3 days, then he is to return to giving her the 2 tabs in the morning and 1 tab in the evening (which was on her last visit).   -Losartan -he reports he was only cutting the Lasix  in half for 1 dose, he was not cutting any of the other pills in half-so  I believe she has been getting the 100 mg tablet daily.  I encouraged them that if he is given 1 tablet of losartan  daily currently, I would recommend he continue the 1 tab of losartan  daily until she follows up. - Prescribed K-Dur 20 mill equivalents twice daily while the increased Lasix  dose.  Last potassium level was 3.5. -BMP, BMP and CBC collected today -Discussed wound/skin care with BB ointment 2x daily -Doxy twice daily prescribed,areas of erythema Present that could be early cellulitis > patient is at high risk for complication/infection with her uncontrolled diabetes. Low-salt diet recommended Keep feet elevated when able Follow-up in 1 week, sooner if needed.    Reviewed expectations re: course of current medical  issues. Discussed self-management of symptoms. Outlined signs and symptoms indicating need for more acute intervention. Patient verbalized understanding and all questions were answered. Patient received an After-Visit Summary.    Orders Placed This Encounter  Procedures   B Nat Peptide   CBC w/Diff   Basic Metabolic Panel (BMET)   Meds ordered this encounter  Medications   doxycycline  (VIBRA -TABS) 100 MG tablet    Sig: Take 1 tablet (100 mg total) by mouth 2 (two) times daily.    Dispense:  14 tablet    Refill:  0   potassium chloride  SA (KLOR-CON  M) 20 MEQ tablet    Sig: Take 1 tablet (20 mEq total) by mouth 2 (two) times daily.    Dispense:  30 tablet    Refill:  0   Referral Orders  No referral(s) requested today     Note is dictated utilizing voice recognition software. Although note has been proof read prior to signing, occasional typographical errors still can be missed. If any questions arise, please do not hesitate to call for verification.   electronically signed by:  Charlies Bellini, DO  [facility] - [place]

## 2024-07-20 NOTE — Patient Instructions (Addendum)
 Return in about 1 week (around 07/27/2024) for LE edema- with pcp.  Lasix  2 tabs twice a day for 3 days, then go back to 2 tabs in the morning and 1 tab at night.  Losartan  100 mg a day (1 tab)       Great to see you today.  I have refilled the medication(s) we provide.   If labs were collected or images ordered, we will inform you of  results once we have received them and reviewed. We will contact you either by echart message, or telephone call.  Please give ample time to the testing facility, and our office to run,  receive and review results. Please do not call inquiring of results, even if you can see them in your chart. We will contact you as soon as we are able. If it has been over 1 week since the test was completed, and you have not yet heard from us , then please call us .    - echart message- for normal results that have been seen by the patient already.   - telephone call: abnormal results or if patient has not viewed results in their echart.  If a referral to a specialist was entered for you, please call us  in 2 weeks if you have not heard from the specialist office to schedule.

## 2024-07-21 ENCOUNTER — Ambulatory Visit: Payer: Self-pay | Admitting: Family Medicine

## 2024-07-21 NOTE — Telephone Encounter (Signed)
 Please call patient Her labs indicate significant fluid overload with electrolyte disturbances from the fluid overload.  Take the Lasix  and losartan  as prescribed. I had also called in potassium supplement, she can take this once a day instead of twice a day since her potassium looked good on current labs.

## 2024-07-27 ENCOUNTER — Other Ambulatory Visit: Payer: Self-pay | Admitting: Family Medicine

## 2024-07-27 ENCOUNTER — Ambulatory Visit: Payer: Self-pay | Admitting: Family Medicine

## 2024-07-27 ENCOUNTER — Encounter: Payer: Self-pay | Admitting: Family Medicine

## 2024-07-27 ENCOUNTER — Ambulatory Visit: Admitting: Family Medicine

## 2024-07-27 VITALS — BP 119/67 | HR 66 | Temp 97.4°F | Ht 62.0 in | Wt 217.4 lb

## 2024-07-27 DIAGNOSIS — R600 Localized edema: Secondary | ICD-10-CM

## 2024-07-27 DIAGNOSIS — I10 Essential (primary) hypertension: Secondary | ICD-10-CM

## 2024-07-27 DIAGNOSIS — I83002 Varicose veins of unspecified lower extremity with ulcer of calf: Secondary | ICD-10-CM

## 2024-07-27 DIAGNOSIS — Z79899 Other long term (current) drug therapy: Secondary | ICD-10-CM

## 2024-07-27 DIAGNOSIS — L97221 Non-pressure chronic ulcer of left calf limited to breakdown of skin: Secondary | ICD-10-CM

## 2024-07-27 DIAGNOSIS — E871 Hypo-osmolality and hyponatremia: Secondary | ICD-10-CM | POA: Diagnosis not present

## 2024-07-27 DIAGNOSIS — L97201 Non-pressure chronic ulcer of unspecified calf limited to breakdown of skin: Secondary | ICD-10-CM | POA: Diagnosis not present

## 2024-07-27 LAB — MAGNESIUM: Magnesium: 1.7 mg/dL (ref 1.5–2.5)

## 2024-07-27 LAB — COMPREHENSIVE METABOLIC PANEL WITH GFR
ALT: 18 U/L (ref 0–35)
AST: 24 U/L (ref 0–37)
Albumin: 3.6 g/dL (ref 3.5–5.2)
Alkaline Phosphatase: 86 U/L (ref 39–117)
BUN: 46 mg/dL — ABNORMAL HIGH (ref 6–23)
CO2: 35 meq/L — ABNORMAL HIGH (ref 19–32)
Calcium: 8.9 mg/dL (ref 8.4–10.5)
Chloride: 93 meq/L — ABNORMAL LOW (ref 96–112)
Creatinine, Ser: 1.4 mg/dL — ABNORMAL HIGH (ref 0.40–1.20)
GFR: 32.81 mL/min — ABNORMAL LOW (ref >60.00)
Glucose, Bld: 96 mg/dL (ref 70–99)
Potassium: 4.9 meq/L (ref 3.5–5.1)
Sodium: 135 meq/L (ref 135–145)
Total Bilirubin: 0.5 mg/dL (ref 0.2–1.2)
Total Protein: 6.3 g/dL (ref 6.0–8.3)

## 2024-07-27 MED ORDER — METOLAZONE 2.5 MG PO TABS: 2.5000 mg | ORAL_TABLET | Freq: Every day | ORAL | 0 refills | Status: DC

## 2024-07-27 NOTE — Patient Instructions (Addendum)
 Continue 40mg  lasix  in the morning and 20mg  in the evening.  Start additional diuretic called metolazone 2.5mg  tab once a day (prescription sent to pharmacy today).

## 2024-07-27 NOTE — Progress Notes (Signed)
 OFFICE VISIT  07/27/2024  CC:  Chief Complaint  Patient presents with   Follow-up    Edema; bilat leg swelling, pt mentioned tingling/numbness   Patient is a 89 y.o. female who presents accompanied by her son for follow-up lower extremity edema. She was seen in the office 07/20/2024 for worsened lower extremity edema with some weeping blisters.  Her Lasix  was increased to 40 mg twice daily for 3 days, then return to baseline dose of 40 mg in the morning and 20 mg in the evening.  It was not clear at that time whether she was taking half of her 100 mg losartan  tab per day or a whole tab per day. She was prescribed supplemental potassium and doxycycline  at that visit as well.  INTERIM HX: Swelling not changed significantly. Currently getting Lasix  40 mg in the morning and 20 mg in the evening. She continues to eat high sodium intake.  In fact, she we will wait for her son to go to bed in the evening and she will get up and start getting food out of the refrigerator and the pantry.  Things like whole packs of bologna and whole packs of cheese for example.  She will not elevate her legs for any significant period of time.   Past Medical History:  Diagnosis Date   Advanced nonexudative age-related macular degeneration of both eyes with subfoveal involvement 01/10/2020   Anxiety and depression    Arthritis    Chronic constipation    Chronic renal insufficiency, stage 3 (moderate)    Combined visual and hearing impairment    Dementia (HCC)    ? vascular   Diabetic peripheral neuropathy (HCC)    duloxetine    DOE (dyspnea on exertion)    GERD (gastroesophageal reflux disease)    Hyperlipidemia    Hypertension    Hypothyroidism    IDDM (insulin  dependent diabetes mellitus)    Leg swelling    Lumbar spondylosis    w/mild stenosis   Macular degeneration    Obesity, class 2    OSA (obstructive sleep apnea)    does not wear cpap   Proximal humerus fracture    2024, proximal right    Varicose veins    Vitamin D  deficiency     Past Surgical History:  Procedure Laterality Date   ANTERIOR CERVICAL DECOMP/DISCECTOMY FUSION N/A 11/08/2014   Procedure: Cervical three-four,  Cervical six-seven anterior cervical decompression with fusion plating and bonegraft;  Surgeon: Rockey Peru, MD;  Location: [facility];  Service: Neurosurgery;  Laterality: N/A;  Cervical three-four,  Cervical six-seven anterior cervical decompression with fusion plating and bonegraft   BIOPSY THYROID      benign thyroid  nodule removal   CATARACT EXTRACTION, BILATERAL     COLONOSCOPY W/ BIOPSIES AND POLYPECTOMY     ENDOMETRIAL BIOPSY  09/09/1998   TUBAL LIGATION     VARICOSE VEIN SURGERY      Outpatient Medications Prior to Visit  Medication Sig Dispense Refill   ACCU-CHEK SOFTCLIX LANCETS lancets CHECK BLOOD SUGER UP TO 3 TIMES A DAY 100 each 0   aspirin  EC (SB LOW DOSE ASA EC) 81 MG tablet Take 1 tablet by mouth daily 90 tablet 1   doxycycline  (VIBRA -TABS) 100 MG tablet Take 1 tablet (100 mg total) by mouth 2 (two) times daily. 14 tablet 0   famotidine  (PEPCID ) 20 MG tablet Take 20 mg by mouth daily.     furosemide  (LASIX ) 40 MG tablet TAKE ONE TABLET TWICE DAILY FOR fluid/swelling  180 tablet 3   GNP ULTICARE PEN NEEDLES 32G X 4 MM MISC AS DIRECTED DAILY 100 each 3   insulin  glargine (LANTUS  SOLOSTAR) 100 UNIT/ML Solostar Pen 50 U SQ once a day 15 mL 6   insulin  lispro (HUMALOG  KWIKPEN) 100 UNIT/ML KwikPen 0-15 units SQ with tid with meals 15 mL 6   levothyroxine  (SYNTHROID ) 100 MCG tablet TAKE (1) TABLET DAILY BE- FORE BREAKFAST. 90 tablet 3   losartan  (COZAAR ) 100 MG tablet 1/2 TABLET ONCE A DAY FOR HIGH BLOOD PRESSURE     Multiple Vitamin (MULTIVITAMIN ADULT PO) Take by mouth daily.     Omega-3 Fatty Acids (FISH OIL PO) Take 1,000 mg by mouth daily.     potassium chloride  SA (KLOR-CON  M) 20 MEQ tablet Take 1 tablet (20 mEq total) by mouth 2 (two) times daily. 30 tablet 0   risperiDONE   (RISPERDAL ) 0.5 MG tablet 1-2 tabs po once a day as needed for agitation 10 tablet 1   rosuvastatin  (CRESTOR ) 5 MG tablet Take 1 tablet (5 mg total) by mouth daily. 90 tablet 3   vitamin B-12 (CYANOCOBALAMIN) 1000 MCG tablet Take 1,000 mcg by mouth daily.     No facility-administered medications prior to visit.    Allergies  Allergen Reactions   Detrol [Tolterodine]     Review of Systems As per HPI  PE:    07/27/2024   11:00 AM 07/20/2024   11:48 AM 07/20/2024   11:42 AM  Vitals with BMI  Height 5' 2    Weight 217 lbs 6 oz  210 lbs 3 oz  BMI 39.75  38.44  Systolic 119 140 853  Diastolic 67 88 92  Pulse 66  63    Physical Exam  Gen: Alert, well appearing.  Patient is oriented to person,  and general place and situation. Cardiovascular: Distant S1 and S2 with faint systolic murmur.  Regular rhythm and rate.   Lungs are clear bilaterally.  Breathing is nonlabored. Extremities: 4+ pitting edema from the knees down into the feet.  Diffuse pinkish hue with some scattered varicosities.  Some weeping is noted but no ulceration deeper than the skin layer.  She has some fibrotic skin changes.  No vesicles.  No areas of erythema.  No asymmetry.    LABS:  Last CBC Lab Results  Component Value Date   WBC 9.4 07/20/2024   HGB 11.0 (L) 07/20/2024   HCT 33.5 (L) 07/20/2024   MCV 82.5 07/20/2024   MCH 27.5 06/04/2024   RDW 14.9 07/20/2024   PLT 307.0 07/20/2024   Lab Results  Component Value Date   IRON 42 (L) 05/21/2021   TIBC 393 05/21/2021   FERRITIN 32 05/21/2021   Last metabolic panel Lab Results  Component Value Date   GLUCOSE 159 (H) 07/20/2024   NA 130 (L) 07/20/2024   K 4.4 07/20/2024   CL 87 (L) 07/20/2024   CO2 34 (H) 07/20/2024   BUN 22 07/20/2024   CREATININE 1.32 (H) 07/20/2024   GFR 35.21 (L) 07/20/2024   CALCIUM  8.8 07/20/2024   PROT 7.3 06/04/2024   ALBUMIN 3.7 06/04/2024   BILITOT 0.9 06/04/2024   ALKPHOS 66 06/04/2024   AST 29 06/04/2024    ALT 24 06/04/2024   ANIONGAP 12 06/04/2024   Last hemoglobin A1c Lab Results  Component Value Date   HGBA1C 8.3 (A) 07/09/2024   HGBA1C 8.3 07/09/2024   HGBA1C 8.3 (A) 07/09/2024   HGBA1C 8.3 (A) 07/09/2024   Last thyroid  functions  Lab Results  Component Value Date   TSH 2.31 07/08/2023   IMPRESSION AND PLAN:  Bilateral lower extremity edema due to venous insufficiency, relative immobility, and chronic excessive sodium intake. (Wt 9/26 was 194 lbs, wt 10/31 was 203 lbs, wt 11/11/ was 210 lbs, and wt today is 217 pounds.) Discussed Unna boot trial today but her son says she absolutely will not let anything stay on her legs--> she will take them off immediately. From a cognitive/behavioral standpoint she will not comply with dietary recommendations or elevation of her legs for any significant amount of time. We will do our best to prevent progression of her edema with use of diuretics, carefully monitoring her renal insufficiency as we go. Monitor complete metabolic panel and magnesium  level today. Plan is to continue Lasix  40 mg in the morning and 20 mg in the afternoon and add Zaroxolyn 2.5 mg daily. Follow-up in 1 week and recheck renal function and electrolytes at that time.  An After Visit Summary was printed and given to the patient.  FOLLOW UP: Return in about 1 week (around 08/03/2024) for f/u edema.  Signed:  Gerlene Hockey, MD           07/27/2024

## 2024-08-02 ENCOUNTER — Other Ambulatory Visit: Payer: Self-pay | Admitting: Family Medicine

## 2024-08-03 ENCOUNTER — Encounter: Payer: Self-pay | Admitting: Family Medicine

## 2024-08-03 ENCOUNTER — Other Ambulatory Visit: Payer: Self-pay

## 2024-08-03 ENCOUNTER — Ambulatory Visit: Admitting: Family Medicine

## 2024-08-03 VITALS — BP 112/62 | HR 63 | Temp 97.2°F | Ht 62.0 in | Wt 201.0 lb

## 2024-08-03 DIAGNOSIS — Z79899 Other long term (current) drug therapy: Secondary | ICD-10-CM

## 2024-08-03 DIAGNOSIS — I10 Essential (primary) hypertension: Secondary | ICD-10-CM

## 2024-08-03 DIAGNOSIS — N2889 Other specified disorders of kidney and ureter: Secondary | ICD-10-CM | POA: Diagnosis not present

## 2024-08-03 DIAGNOSIS — R600 Localized edema: Secondary | ICD-10-CM

## 2024-08-03 DIAGNOSIS — Z111 Encounter for screening for respiratory tuberculosis: Secondary | ICD-10-CM

## 2024-08-03 LAB — BASIC METABOLIC PANEL WITH GFR
BUN: 65 mg/dL — ABNORMAL HIGH (ref 6–23)
CO2: 38 meq/L — ABNORMAL HIGH (ref 19–32)
Calcium: 9.5 mg/dL (ref 8.4–10.5)
Chloride: 89 meq/L — ABNORMAL LOW (ref 96–112)
Creatinine, Ser: 1.57 mg/dL — ABNORMAL HIGH (ref 0.40–1.20)
GFR: 28.59 mL/min — ABNORMAL LOW (ref >60.00)
Glucose, Bld: 247 mg/dL — ABNORMAL HIGH (ref 70–99)
Potassium: 4.7 meq/L (ref 3.5–5.1)
Sodium: 134 meq/L — ABNORMAL LOW (ref 135–145)

## 2024-08-03 MED ORDER — METOLAZONE 2.5 MG PO TABS: ORAL_TABLET | ORAL | Status: DC

## 2024-08-03 NOTE — Telephone Encounter (Signed)
 Stew from [facility] called to follow up on refill for the potassium chloride  SA (KLOR-CON  M) 20 MEQ tablet from yesteday - patient is completely out and they need the directions/quantity to be updated. Please call them with at update at [phone] (Work)   Please advise if pt needs to continue taking, last refill 11/11 #30 15 d/s. Rx pending

## 2024-08-03 NOTE — Progress Notes (Signed)
 OFFICE VISIT  08/03/2024  CC:  Chief Complaint  Patient presents with   Edema    1 week f/u; family is working with [employer] in [place]; needing 3 months of labs and notes including Quantiferon TB test.    Patient is a 89 y.o. female who presents accompanied by her son and daughter for 1 week f/u lower ext edema. A/P as of last visit: Bilateral lower extremity edema due to venous insufficiency, relative immobility, and chronic excessive sodium intake. (Wt 9/26 was 194 lbs, wt 10/31 was 203 lbs, wt 11/11/ was 210 lbs, and wt today is 217 pounds.) Discussed Unna boot trial today but her son says she absolutely will not let anything stay on her legs--> she will take them off immediately. From a cognitive/behavioral standpoint she will not comply with dietary recommendations or elevation of her legs for any significant amount of time. We will do our best to prevent progression of her edema with use of diuretics, carefully monitoring her renal insufficiency as we go. Monitor complete metabolic panel and magnesium  level today. Plan is to continue Lasix  40 mg in the morning and 20 mg in the afternoon and add Zaroxolyn  2.5 mg daily. Follow-up in 1 week and recheck renal function and electrolytes at that time.  INTERIM HX: They have made significant changes in her sodium intake! Legs look better. It sounds like they have been giving 2 of the 40 mg Lasix  tabs twice a day as well as the 2.5 mg metolazone  tab daily.  Her weight is down nearly 17 pounds over the last week.  They are making arrangements for her to get into a nursing home whenever a bed opens up.  Past Medical History:  Diagnosis Date   Advanced nonexudative age-related macular degeneration of both eyes with subfoveal involvement 01/10/2020   Anxiety and depression    Arthritis    Chronic constipation    Chronic renal insufficiency, stage 3 (moderate)    Combined visual and hearing impairment    Dementia (HCC)    ?  vascular   Diabetic peripheral neuropathy (HCC)    duloxetine    DOE (dyspnea on exertion)    GERD (gastroesophageal reflux disease)    Hyperlipidemia    Hypertension    Hypothyroidism    IDDM (insulin  dependent diabetes mellitus)    Leg swelling    Lumbar spondylosis    w/mild stenosis   Macular degeneration    Obesity, class 2    OSA (obstructive sleep apnea)    does not wear cpap   Proximal humerus fracture    2024, proximal right   Varicose veins    Vitamin D  deficiency     Past Surgical History:  Procedure Laterality Date   ANTERIOR CERVICAL DECOMP/DISCECTOMY FUSION N/A 11/08/2014   Procedure: Cervical three-four,  Cervical six-seven anterior cervical decompression with fusion plating and bonegraft;  Surgeon: Rockey Peru, MD;  Location: [facility];  Service: Neurosurgery;  Laterality: N/A;  Cervical three-four,  Cervical six-seven anterior cervical decompression with fusion plating and bonegraft   BIOPSY THYROID      benign thyroid  nodule removal   CATARACT EXTRACTION, BILATERAL     COLONOSCOPY W/ BIOPSIES AND POLYPECTOMY     ENDOMETRIAL BIOPSY  09/09/1998   TUBAL LIGATION     VARICOSE VEIN SURGERY      Outpatient Medications Prior to Visit  Medication Sig Dispense Refill   ACCU-CHEK SOFTCLIX LANCETS lancets CHECK BLOOD SUGER UP TO 3 TIMES A DAY 100 each 0  aspirin  EC (SB LOW DOSE ASA EC) 81 MG tablet Take 1 tablet by mouth daily 90 tablet 1   famotidine  (PEPCID ) 20 MG tablet Take 20 mg by mouth daily.     furosemide  (LASIX ) 40 MG tablet TAKE ONE TABLET TWICE DAILY FOR fluid/swelling 180 tablet 3   GNP ULTICARE PEN NEEDLES 32G X 4 MM MISC AS DIRECTED DAILY 100 each 3   insulin  glargine (LANTUS  SOLOSTAR) 100 UNIT/ML Solostar Pen 50 U SQ once a day 15 mL 6   insulin  lispro (HUMALOG  KWIKPEN) 100 UNIT/ML KwikPen 0-15 units SQ with tid with meals 15 mL 6   levothyroxine  (SYNTHROID ) 100 MCG tablet TAKE (1) TABLET DAILY BE- FORE BREAKFAST. 90 tablet 3   losartan  (COZAAR )  100 MG tablet TAKE ONE TABLET ONCE DAILY 90 tablet 1   Multiple Vitamin (MULTIVITAMIN ADULT PO) Take by mouth daily.     Omega-3 Fatty Acids (FISH OIL PO) Take 1,000 mg by mouth daily.     potassium chloride  SA (KLOR-CON  M) 20 MEQ tablet Take 1 tablet (20 mEq total) by mouth 2 (two) times daily. 30 tablet 0   risperiDONE  (RISPERDAL ) 0.5 MG tablet 1-2 tabs po once a day as needed for agitation 10 tablet 1   rosuvastatin  (CRESTOR ) 5 MG tablet Take 1 tablet (5 mg total) by mouth daily. 90 tablet 3   vitamin B-12 (CYANOCOBALAMIN) 1000 MCG tablet Take 1,000 mcg by mouth daily.     doxycycline  (VIBRA -TABS) 100 MG tablet Take 1 tablet (100 mg total) by mouth 2 (two) times daily. 14 tablet 0   metolazone  (ZAROXOLYN ) 2.5 MG tablet Take 1 tablet (2.5 mg total) by mouth daily. 30 tablet 0   No facility-administered medications prior to visit.    Allergies  Allergen Reactions   Detrol [Tolterodine]     Review of Systems As per HPI  PE:    08/03/2024   11:02 AM 07/27/2024   11:00 AM 07/20/2024   11:48 AM  Vitals with BMI  Height 5' 2 5' 2   Weight 201 lbs 217 lbs 6 oz   BMI 36.75 39.75   Systolic 112 119 859  Diastolic 62 67 88  Pulse 63 66      Physical Exam  General: Alert, sitting in wheelchair, does not appear acutely ill. Extremities: 3+ bilateral lower extremity pitting with scattered papular nodular skin thickening and deep pink hue to the pretibial surfaces.  No erythema or skin breakdown.  LABS:  Last CBC Lab Results  Component Value Date   WBC 9.4 07/20/2024   HGB 11.0 (L) 07/20/2024   HCT 33.5 (L) 07/20/2024   MCV 82.5 07/20/2024   MCH 27.5 06/04/2024   RDW 14.9 07/20/2024   PLT 307.0 07/20/2024   Lab Results  Component Value Date   IRON 42 (L) 05/21/2021   TIBC 393 05/21/2021   FERRITIN 32 05/21/2021   Last metabolic panel Lab Results  Component Value Date   GLUCOSE 96 07/27/2024   NA 135 07/27/2024   K 4.9 07/27/2024   CL 93 (L) 07/27/2024   CO2 35  (H) 07/27/2024   BUN 46 (H) 07/27/2024   CREATININE 1.40 (H) 07/27/2024   GFR 32.81 (L) 07/27/2024   CALCIUM  8.9 07/27/2024   PROT 6.3 07/27/2024   ALBUMIN 3.6 07/27/2024   BILITOT 0.5 07/27/2024   ALKPHOS 86 07/27/2024   AST 24 07/27/2024   ALT 18 07/27/2024   ANIONGAP 12 06/04/2024   Last hemoglobin A1c Lab Results  Component Value  Date   HGBA1C 8.3 (A) 07/09/2024   HGBA1C 8.3 07/09/2024   HGBA1C 8.3 (A) 07/09/2024   HGBA1C 8.3 (A) 07/09/2024   IMPRESSION AND PLAN:  #1 bilateral lower extremity edema due to venous insufficiency, relative immobility, and chronic excessive sodium intake.. Much improved with low-sodium diet and recent significant increase in diuretic. Definitely want to decrease diuretic to one of the 40 mg Lasix  tabs twice a day and one of the 2.5 mg Zaroxolyn  tabs every other day now. Monitor electrolytes and creatinine today and we may need to decrease these doses even further based on results.  #2 chronic renal insufficiency stage IIIb. Monitor renal function today. Decreasing diuretics as per #1 above.  #3 diabetes, poor control, last A1c 8.3%. She has made significant dietary changes and hopefully they can keep this up, although the dietary problems may reemerge if she gets into a nursing home. Next A1c around 10/10/23.  #4 hypertension, well-controlled on losartan  100 mg a day. Monitoring electrolytes and creatinine today.  An After Visit Summary was printed and given to the patient.  FOLLOW UP: No follow-ups on file.  Signed:  Gerlene Hockey, MD           08/03/2024

## 2024-08-03 NOTE — Patient Instructions (Signed)
 Take one 40mg  furosemide  (lasix ) twice a day.  Take one metolazone  (zaroxolyn ) 2.5mg  tab every other day.

## 2024-08-05 LAB — QUANTIFERON-TB GOLD PLUS
Mitogen-NIL: 7 [IU]/mL
NIL: 0.01 [IU]/mL
QuantiFERON-TB Gold Plus: NEGATIVE
TB1-NIL: 0 [IU]/mL
TB2-NIL: 0 [IU]/mL

## 2024-08-07 ENCOUNTER — Ambulatory Visit: Payer: Self-pay | Admitting: Family Medicine

## 2024-08-09 MED ORDER — POTASSIUM CHLORIDE CRYS ER 20 MEQ PO TBCR: 20.0000 meq | EXTENDED_RELEASE_TABLET | Freq: Every day | ORAL | 1 refills | Status: AC

## 2024-08-09 NOTE — Telephone Encounter (Signed)
 Okay, prescription sent with new instructions--> take 1 daily.

## 2024-08-11 ENCOUNTER — Ambulatory Visit: Admitting: Family Medicine

## 2024-08-18 ENCOUNTER — Other Ambulatory Visit: Payer: Self-pay | Admitting: Family Medicine

## 2024-08-18 MED ORDER — INSULIN LISPRO (1 UNIT DIAL) 100 UNIT/ML (KWIKPEN): PEN_INJECTOR | SUBCUTANEOUS | Status: AC

## 2024-09-06 ENCOUNTER — Other Ambulatory Visit: Payer: Self-pay | Admitting: Family Medicine

## 2024-09-14 ENCOUNTER — Telehealth: Payer: Self-pay

## 2024-09-14 NOTE — Telephone Encounter (Signed)
Placed on PCP desk to review and sign, if appropriate.  

## 2024-09-14 NOTE — Telephone Encounter (Signed)
 Patient dropped off document FL2, to be filled out by provider. Patient requested to send it back via Call Patient to pick up within 7-days.

## 2024-09-14 NOTE — Telephone Encounter (Signed)
 Primary Information  Source  Halabi,[name] (Non-Patient)   Subject  Catherine Wood, Catherine Wood (Patient)   Topic  Clinical - Medical Advice    Communication  Reason for CRM: Patient son needs these 2 forms CSRA form and an LTC-FL2 form filled out by the doctor and so that he can come and pick them up. He state that its for assisted living facility   Patient Information  Patient Name Gender DOB SSN  Catherine Wood, Catherine Wood Female 1931-06-10 [id number]   Contacts  Contact Date/Time Type Contact Phone/Fax  09/14/2024 11:35 AM EST Phone (Incoming) Harland,[name] (Emergency Contact) [phone]   Routing History   From To Priority  09/14/2024 11:39 AM Ezzard Battles M P [facility]   Spoke with pt, he will call the facility back and see if they have the appropriate forms needed. If so, he will bring the forms to our office for completion

## 2024-09-16 ENCOUNTER — Ambulatory Visit (INDEPENDENT_AMBULATORY_CARE_PROVIDER_SITE_OTHER): Admitting: Family Medicine

## 2024-09-16 ENCOUNTER — Encounter: Payer: Self-pay | Admitting: Family Medicine

## 2024-09-16 VITALS — BP 146/74 | HR 67 | Temp 97.6°F | Ht 62.0 in | Wt 214.4 lb

## 2024-09-16 DIAGNOSIS — Z794 Long term (current) use of insulin: Secondary | ICD-10-CM

## 2024-09-16 DIAGNOSIS — R600 Localized edema: Secondary | ICD-10-CM | POA: Diagnosis not present

## 2024-09-16 DIAGNOSIS — E039 Hypothyroidism, unspecified: Secondary | ICD-10-CM | POA: Diagnosis not present

## 2024-09-16 DIAGNOSIS — I10 Essential (primary) hypertension: Secondary | ICD-10-CM | POA: Diagnosis not present

## 2024-09-16 DIAGNOSIS — N2889 Other specified disorders of kidney and ureter: Secondary | ICD-10-CM | POA: Diagnosis not present

## 2024-09-16 DIAGNOSIS — E1121 Type 2 diabetes mellitus with diabetic nephropathy: Secondary | ICD-10-CM

## 2024-09-16 NOTE — Patient Instructions (Signed)
 Take metolazone  2.5mg  tab EVERY DAY for 5 days, then take one tab every other day.  Continue all other medication as is.

## 2024-09-16 NOTE — Telephone Encounter (Signed)
 Forms completed and signed and handed to [place].

## 2024-09-16 NOTE — Progress Notes (Signed)
 OFFICE VISIT  09/16/2024  CC:  Chief Complaint  Patient presents with   Medical Management of Chronic Issues    Catherine Wood    Patient is a 89 y.o. female who presents accompanied by her son Catherine Wood for 6-week follow-up DM, bilateral lower extremity edema, hypertension, and chronic renal insufficiency. A/P as of last visit: #1 bilateral lower extremity edema due to venous insufficiency, relative immobility, and chronic excessive sodium intake. Much improved with low-sodium diet and recent significant increase in diuretic. Definitely want to decrease diuretic to one of the 40 mg Lasix  tabs twice a day and one of the 2.5 mg Zaroxolyn  tabs every other day now. Monitor electrolytes and creatinine today and we may need to decrease these doses even further based on results.   #2 chronic renal insufficiency stage IIIb. Monitor renal function today. Decreasing diuretics as per #1 above.   #3 diabetes, poor control, last A1c 8.3%. She has made significant dietary changes and hopefully they can keep this up, although the dietary problems may reemerge if she gets into a nursing home. Next A1c around 10/10/23.   #4 hypertension, well-controlled on losartan  100 mg a day. Monitoring electrolytes and creatinine today.  INTERIM HX: She is feeling fine.  Glucoses quite erratic, as per her usual---> ranging from less than 70 up into the 400s at times. She eats very well.  Does not limit sodium anymore. She has gained weight.   (Wt 9/26 was 194 lbs, wt 10/31 was 203 lbs, wt 11/11/ was 210 lbs,  217 pounds on 07/27/24 and 201 lb on 08/03/24).   Past Medical History:  Diagnosis Date   Advanced nonexudative age-related macular degeneration of both eyes with subfoveal involvement 01/10/2020   Anxiety and depression    Arthritis    Chronic constipation    Chronic renal insufficiency, stage 3 (moderate)    Combined visual and hearing impairment    Dementia (HCC)    ? vascular   Diabetic peripheral  neuropathy (HCC)    duloxetine    DOE (dyspnea on exertion)    GERD (gastroesophageal reflux disease)    Hyperlipidemia    Hypertension    Hypothyroidism    IDDM (insulin  dependent diabetes mellitus)    Leg swelling    Lumbar spondylosis    w/mild stenosis   Macular degeneration    Obesity, class 2    OSA (obstructive sleep apnea)    does not wear cpap   Proximal humerus fracture    2024, proximal right   Varicose veins    Vitamin D  deficiency     Past Surgical History:  Procedure Laterality Date   ANTERIOR CERVICAL DECOMP/DISCECTOMY FUSION N/A 11/08/2014   Procedure: Cervical three-four,  Cervical six-seven anterior cervical decompression with fusion plating and bonegraft;  Surgeon: Rockey Peru, MD;  Location: [facility];  Service: Neurosurgery;  Laterality: N/A;  Cervical three-four,  Cervical six-seven anterior cervical decompression with fusion plating and bonegraft   BIOPSY THYROID      benign thyroid  nodule removal   CATARACT EXTRACTION, BILATERAL     COLONOSCOPY W/ BIOPSIES AND POLYPECTOMY     ENDOMETRIAL BIOPSY  09/09/1998   TUBAL LIGATION     VARICOSE VEIN SURGERY      Outpatient Medications Prior to Visit  Medication Sig Dispense Refill   ACCU-CHEK SOFTCLIX LANCETS lancets CHECK BLOOD SUGER UP TO 3 TIMES A DAY 100 each 0   aspirin  EC (SB LOW DOSE ASA EC) 81 MG tablet Take 1 tablet by mouth daily 90  tablet 1   famotidine  (PEPCID ) 20 MG tablet Take 20 mg by mouth daily.     furosemide  (LASIX ) 40 MG tablet TAKE ONE TABLET TWICE DAILY FOR fluid/swelling 180 tablet 3   GNP ULTICARE PEN NEEDLES 32G X 4 MM MISC AS DIRECTED DAILY 100 each 3   insulin  glargine (LANTUS  SOLOSTAR) 100 UNIT/ML Solostar Pen 50 U SQ once a day 15 mL 6   insulin  lispro (HUMALOG  KWIKPEN) 100 UNIT/ML KwikPen 6 units SQ with tid with meals     levothyroxine  (SYNTHROID ) 100 MCG tablet TAKE (1) TABLET DAILY BE- FORE BREAKFAST. 90 tablet 3   losartan  (COZAAR ) 100 MG tablet TAKE ONE TABLET ONCE DAILY  90 tablet 1   metolazone  (ZAROXOLYN ) 2.5 MG tablet TAKE 1 TABLET DAILY 30 tablet 0   Multiple Vitamin (MULTIVITAMIN ADULT PO) Take by mouth daily.     Omega-3 Fatty Acids (FISH OIL PO) Take 1,000 mg by mouth daily.     potassium chloride  SA (KLOR-CON  M) 20 MEQ tablet Take 1 tablet (20 mEq total) by mouth daily. 90 tablet 1   risperiDONE  (RISPERDAL ) 0.5 MG tablet 1-2 tabs po once a day as needed for agitation 10 tablet 1   rosuvastatin  (CRESTOR ) 5 MG tablet Take 1 tablet (5 mg total) by mouth daily. 90 tablet 3   vitamin B-12 (CYANOCOBALAMIN) 1000 MCG tablet Take 1,000 mcg by mouth daily.     No facility-administered medications prior to visit.    Allergies[1]  Review of Systems As per HPI  PE:    09/16/2024    2:17 PM 09/16/2024    1:53 PM 08/03/2024   11:02 AM  Vitals with BMI  Height  5' 2 5' 2  Weight  214 lbs 6 oz 201 lbs  BMI  39.2 36.75  Systolic 146 147 887  Diastolic 74 71 62  Pulse  67 63     Physical Exam  Gen: Alert, well appearing.  Patient is very HOH. AFFECT: pleasant. CV: RRR, no m/r/g.   LUNGS: CTA bilat, nonlabored resps, good aeration in all lung fields. Extremities: 4+ bilateral lower extremity pitting edema with a moderate amount of nonpitting edema as well.  She has hyperkeratotic pretibial skin changes.  No erythema or skin breakdown.  LABS:  Last CBC Lab Results  Component Value Date   WBC 9.4 07/20/2024   HGB 11.0 (L) 07/20/2024   HCT 33.5 (L) 07/20/2024   MCV 82.5 07/20/2024   MCH 27.5 06/04/2024   RDW 14.9 07/20/2024   PLT 307.0 07/20/2024   Lab Results  Component Value Date   VITAMINB12 >2000 (H) 01/03/2017   Lab Results  Component Value Date   IRON 42 (L) 05/21/2021   TIBC 393 05/21/2021   FERRITIN 32 05/21/2021   Last metabolic panel Lab Results  Component Value Date   GLUCOSE 247 (H) 08/03/2024   NA 134 (L) 08/03/2024   K 4.7 08/03/2024   CL 89 (L) 08/03/2024   CO2 38 (H) 08/03/2024   BUN 65 (H) 08/03/2024    CREATININE 1.57 (H) 08/03/2024   GFR 28.59 (L) 08/03/2024   CALCIUM  9.5 08/03/2024   PROT 6.3 07/27/2024   ALBUMIN 3.6 07/27/2024   BILITOT 0.5 07/27/2024   ALKPHOS 86 07/27/2024   AST 24 07/27/2024   ALT 18 07/27/2024   ANIONGAP 12 06/04/2024   Last hemoglobin A1c Lab Results  Component Value Date   HGBA1C 8.3 (A) 07/09/2024   HGBA1C 8.3 07/09/2024   HGBA1C 8.3 (A) 07/09/2024  HGBA1C 8.3 (A) 07/09/2024   Lab Results  Component Value Date   TSH 2.31 07/08/2023   Lab Results  Component Value Date   CHOL 158 07/08/2023   HDL 57 07/08/2023   LDLCALC 72 07/08/2023   TRIG 194 (H) 07/08/2023   CHOLHDL 2.8 07/08/2023   IMPRESSION AND PLAN:  #1 bilateral lower extremity edema due to venous insufficiency, relative immobility, and chronic excessive sodium intake. Much improved with low-sodium diet and recent significant increase in diuretic. Continue Lasix  40 mg twice a day and increase the Zaroxolyn  to a whole 2.5 mg tab every day for the next 5 days.  Then resume one of the 2.5 mg Zaroxolyn  tabs every other day. Monitor electrolytes and creatinine today and we may need to decrease these doses even further based on results.   #2 chronic renal insufficiency stage IIIb. Monitor renal function today.   #3 diabetes, poor control, last A1c 8.3%. She is unable to adhere to a diabetic diet.  Continue Lantus  50 units once a day and Humalog  6 units 3 times a day with meals---> her son does good insulin  adjustments based on her glucoses, particularly with respect to avoiding symptomatic hypoglycemia. Next A1c after 10/10/23.   #4 hypertension, well-controlled on losartan  100 mg a day. Monitoring electrolytes and creatinine today.  An After Visit Summary was printed and given to the patient.  Family is in the process of getting her into a nursing home.  FOLLOW UP: Return in about 6 weeks (around 10/28/2024) for routine chronic illness f/u.  Signed:  Phil Ralf Konopka, MD            09/16/2024      [1]  Allergies Allergen Reactions   Detrol [Tolterodine]

## 2024-09-17 LAB — BASIC METABOLIC PANEL WITH GFR
BUN: 43 mg/dL — ABNORMAL HIGH (ref 6–23)
CO2: 36 meq/L — ABNORMAL HIGH (ref 19–32)
Calcium: 8.7 mg/dL (ref 8.4–10.5)
Chloride: 90 meq/L — ABNORMAL LOW (ref 96–112)
Creatinine, Ser: 1.65 mg/dL — ABNORMAL HIGH (ref 0.40–1.20)
GFR: 26.91 mL/min — ABNORMAL LOW
Glucose, Bld: 268 mg/dL — ABNORMAL HIGH (ref 70–99)
Potassium: 4.6 meq/L (ref 3.5–5.1)
Sodium: 133 meq/L — ABNORMAL LOW (ref 135–145)

## 2024-09-17 LAB — TSH: TSH: 7.91 u[IU]/mL — ABNORMAL HIGH (ref 0.35–5.50)

## 2024-09-19 ENCOUNTER — Ambulatory Visit: Payer: Self-pay | Admitting: Family Medicine

## 2024-09-21 ENCOUNTER — Telehealth: Payer: Self-pay

## 2024-09-21 NOTE — Addendum Note (Signed)
 Addended by: CANDISE ALEENE DEL on: 09/21/2024 07:39 PM   Modules accepted: Orders

## 2024-09-21 NOTE — Telephone Encounter (Signed)
 OK risperidone  taken off EMR med list.

## 2024-09-21 NOTE — Telephone Encounter (Signed)
 Copied from CRM #[id number]. Topic: Clinical - Medication Question >> Sep 21, 2024 12:23 PM Alexandria E wrote: Reason for CRM: Son, Noreene, called in stating that he is trying to get patient into a nursing home. He is needing PCP to take off risperiDONE  (RISPERDAL ) 0.5 MG table from medication list, as he has not been giving this medication to the patient.

## 2024-09-22 NOTE — Telephone Encounter (Signed)
 LVM for pt's son to return call regarding medication and medication list.

## 2024-09-22 NOTE — Telephone Encounter (Signed)
 Pt's son [name] advised medication list updated, he will print off an updated copy from MyChart.

## 2024-10-01 ENCOUNTER — Inpatient Hospital Stay (HOSPITAL_COMMUNITY)
Admission: EM | Admit: 2024-10-01 | Discharge: 2024-10-10 | DRG: 551 | Disposition: E | Source: Skilled Nursing Facility | Attending: Surgery | Admitting: Surgery

## 2024-10-01 DIAGNOSIS — Y92129 Unspecified place in nursing home as the place of occurrence of the external cause: Secondary | ICD-10-CM

## 2024-10-01 DIAGNOSIS — W1830XA Fall on same level, unspecified, initial encounter: Secondary | ICD-10-CM | POA: Diagnosis present

## 2024-10-01 DIAGNOSIS — S0240DA Maxillary fracture, left side, initial encounter for closed fracture: Secondary | ICD-10-CM | POA: Diagnosis present

## 2024-10-01 DIAGNOSIS — I1310 Hypertensive heart and chronic kidney disease without heart failure, with stage 1 through stage 4 chronic kidney disease, or unspecified chronic kidney disease: Secondary | ICD-10-CM | POA: Diagnosis present

## 2024-10-01 DIAGNOSIS — F03C0 Unspecified dementia, severe, without behavioral disturbance, psychotic disturbance, mood disturbance, and anxiety: Secondary | ICD-10-CM | POA: Diagnosis present

## 2024-10-01 DIAGNOSIS — N179 Acute kidney failure, unspecified: Secondary | ICD-10-CM | POA: Diagnosis present

## 2024-10-01 DIAGNOSIS — R579 Shock, unspecified: Secondary | ICD-10-CM | POA: Diagnosis present

## 2024-10-01 DIAGNOSIS — H3530 Unspecified macular degeneration: Secondary | ICD-10-CM | POA: Diagnosis present

## 2024-10-01 DIAGNOSIS — N189 Chronic kidney disease, unspecified: Secondary | ICD-10-CM | POA: Diagnosis present

## 2024-10-01 DIAGNOSIS — I4891 Unspecified atrial fibrillation: Secondary | ICD-10-CM | POA: Diagnosis present

## 2024-10-01 DIAGNOSIS — S0232XA Fracture of orbital floor, left side, initial encounter for closed fracture: Secondary | ICD-10-CM | POA: Diagnosis present

## 2024-10-01 DIAGNOSIS — S022XXB Fracture of nasal bones, initial encounter for open fracture: Secondary | ICD-10-CM | POA: Diagnosis present

## 2024-10-01 DIAGNOSIS — E1140 Type 2 diabetes mellitus with diabetic neuropathy, unspecified: Secondary | ICD-10-CM | POA: Diagnosis present

## 2024-10-01 DIAGNOSIS — Z23 Encounter for immunization: Secondary | ICD-10-CM

## 2024-10-01 DIAGNOSIS — S12391A Other nondisplaced fracture of fourth cervical vertebra, initial encounter for closed fracture: Principal | ICD-10-CM | POA: Diagnosis present

## 2024-10-01 DIAGNOSIS — E785 Hyperlipidemia, unspecified: Secondary | ICD-10-CM | POA: Diagnosis present

## 2024-10-01 DIAGNOSIS — Z515 Encounter for palliative care: Secondary | ICD-10-CM

## 2024-10-01 DIAGNOSIS — H1132 Conjunctival hemorrhage, left eye: Secondary | ICD-10-CM | POA: Diagnosis present

## 2024-10-01 DIAGNOSIS — Z6841 Body Mass Index (BMI) 40.0 and over, adult: Secondary | ICD-10-CM

## 2024-10-01 DIAGNOSIS — K219 Gastro-esophageal reflux disease without esophagitis: Secondary | ICD-10-CM | POA: Diagnosis present

## 2024-10-01 DIAGNOSIS — S14114A Complete lesion at C4 level of cervical spinal cord, initial encounter: Secondary | ICD-10-CM | POA: Diagnosis present

## 2024-10-01 DIAGNOSIS — Z66 Do not resuscitate: Secondary | ICD-10-CM | POA: Diagnosis present

## 2024-10-01 DIAGNOSIS — E039 Hypothyroidism, unspecified: Secondary | ICD-10-CM | POA: Diagnosis present

## 2024-10-01 DIAGNOSIS — G8251 Quadriplegia, C1-C4 complete: Secondary | ICD-10-CM | POA: Diagnosis present

## 2024-10-01 DIAGNOSIS — S129XXA Fracture of neck, unspecified, initial encounter: Secondary | ICD-10-CM | POA: Diagnosis present

## 2024-10-01 DIAGNOSIS — J690 Pneumonitis due to inhalation of food and vomit: Secondary | ICD-10-CM | POA: Diagnosis present

## 2024-10-01 DIAGNOSIS — W19XXXA Unspecified fall, initial encounter: Principal | ICD-10-CM

## 2024-10-01 DIAGNOSIS — M4802 Spinal stenosis, cervical region: Secondary | ICD-10-CM | POA: Diagnosis present

## 2024-10-01 DIAGNOSIS — I4510 Unspecified right bundle-branch block: Secondary | ICD-10-CM | POA: Diagnosis present

## 2024-10-01 DIAGNOSIS — S0101XA Laceration without foreign body of scalp, initial encounter: Secondary | ICD-10-CM | POA: Diagnosis present

## 2024-10-01 DIAGNOSIS — I472 Ventricular tachycardia, unspecified: Secondary | ICD-10-CM | POA: Diagnosis not present

## 2024-10-01 HISTORY — DX: Essential (primary) hypertension: I10

## 2024-10-01 HISTORY — DX: Unspecified dementia, unspecified severity, without behavioral disturbance, psychotic disturbance, mood disturbance, and anxiety: F03.90

## 2024-10-01 HISTORY — DX: Hyperlipidemia, unspecified: E78.5

## 2024-10-01 LAB — COMPREHENSIVE METABOLIC PANEL WITH GFR
ALT: 16 U/L (ref 0–44)
AST: 29 U/L (ref 15–41)
Albumin: 3.2 g/dL — ABNORMAL LOW (ref 3.5–5.0)
Alkaline Phosphatase: 86 U/L (ref 38–126)
Anion gap: 11 (ref 5–15)
BUN: 77 mg/dL — ABNORMAL HIGH (ref 8–23)
CO2: 27 mmol/L (ref 22–32)
Calcium: 7.9 mg/dL — ABNORMAL LOW (ref 8.9–10.3)
Chloride: 92 mmol/L — ABNORMAL LOW (ref 98–111)
Creatinine, Ser: 2.25 mg/dL — ABNORMAL HIGH (ref 0.44–1.00)
GFR, Estimated: 20 mL/min — ABNORMAL LOW
Glucose, Bld: 153 mg/dL — ABNORMAL HIGH (ref 70–99)
Potassium: 4.6 mmol/L (ref 3.5–5.1)
Sodium: 130 mmol/L — ABNORMAL LOW (ref 135–145)
Total Bilirubin: 0.3 mg/dL (ref 0.0–1.2)
Total Protein: 5.8 g/dL — ABNORMAL LOW (ref 6.5–8.1)

## 2024-10-01 LAB — CBC
HCT: 29.2 % — ABNORMAL LOW (ref 36.0–46.0)
Hemoglobin: 9.5 g/dL — ABNORMAL LOW (ref 12.0–15.0)
MCH: 27.6 pg (ref 26.0–34.0)
MCHC: 32.5 g/dL (ref 30.0–36.0)
MCV: 84.9 fL (ref 80.0–100.0)
Platelets: 217 10*3/uL (ref 150–400)
RBC: 3.44 MIL/uL — ABNORMAL LOW (ref 3.87–5.11)
RDW: 16.1 % — ABNORMAL HIGH (ref 11.5–15.5)
WBC: 18 10*3/uL — ABNORMAL HIGH (ref 4.0–10.5)
nRBC: 0 % (ref 0.0–0.2)

## 2024-10-01 LAB — I-STAT CHEM 8, ED
BUN: 80 mg/dL — ABNORMAL HIGH (ref 8–23)
Calcium, Ion: 1.02 mmol/L — ABNORMAL LOW (ref 1.15–1.40)
Chloride: 91 mmol/L — ABNORMAL LOW (ref 98–111)
Creatinine, Ser: 2.4 mg/dL — ABNORMAL HIGH (ref 0.44–1.00)
Glucose, Bld: 150 mg/dL — ABNORMAL HIGH (ref 70–99)
HCT: 31 % — ABNORMAL LOW (ref 36.0–46.0)
Hemoglobin: 10.5 g/dL — ABNORMAL LOW (ref 12.0–15.0)
Potassium: 4.5 mmol/L (ref 3.5–5.1)
Sodium: 130 mmol/L — ABNORMAL LOW (ref 135–145)
TCO2: 26 mmol/L (ref 22–32)

## 2024-10-01 LAB — PROTIME-INR
INR: 1.2 (ref 0.8–1.2)
Prothrombin Time: 16.2 s — ABNORMAL HIGH (ref 11.4–15.2)

## 2024-10-01 LAB — I-STAT CG4 LACTIC ACID, ED: Lactic Acid, Venous: 1.6 mmol/L (ref 0.5–1.9)

## 2024-10-01 LAB — TROPONIN T, HIGH SENSITIVITY: Troponin T High Sensitivity: 53 ng/L — ABNORMAL HIGH (ref 0–19)

## 2024-10-01 LAB — ETHANOL: Alcohol, Ethyl (B): <15 mg/dL

## 2024-10-01 MED ADMIN — Epinephrine PF Inj 1 MG/ML: 1 ug/min | INTRAVENOUS | NDC 99999070065

## 2024-10-01 MED ADMIN — Iohexol IV Soln 350 MG/ML: 75 mL | INTRAVENOUS | NDC 00407141490

## 2024-10-01 MED ADMIN — Cefazolin Sodium-Dextrose IV Solution 2 GM/100ML-4%: 2 g | INTRAVENOUS | NDC 00338350841

## 2024-10-01 MED ADMIN — Tet Tox-Diph-Acell Pertuss Ad Inj 5-2-15.5 LF-LF-MCG/0.5ML: 0.5 mL | INTRAMUSCULAR | NDC 49281040089

## 2024-10-01 MED ADMIN — Sodium Chloride IV Soln 0.9%: 1000 mL | INTRAVENOUS | NDC 00338004904

## 2024-10-01 NOTE — ED Triage Notes (Addendum)
 Pt BIB from a facility. Pt was on the toilet and fell face forward. Avulsion to forehead, EMS reports visible skull. 40P initially and agonal breathing 77% RA, BVM started. Epi drip started at 2mcg. 70SBP initally, last BP was 81/47. No blood thinners. Arrived to ED being paced. EMS reports pt was on the ground for 10 min, hx dementia

## 2024-10-01 NOTE — ED Notes (Signed)
 Skin tear noted to right hand.

## 2024-10-01 NOTE — H&P (Addendum)
" ° °  TRAUMA H&P  10/01/2024, 9:45 PM   Chief Complaint: Level 1 trauma activation for fall, hypotension  Primary Survey:  ABC's intact on arrival Arrived with c-collar in place  The patient is an 89 y.o. female.   HPI: 85F was getting up with assistance from the toilet when she fell forward hitting her walker. She is admitted to a living facility with severe dementia and reportedly has a DNR per EMS. She was paced with an epi drip en route.   No past medical history on file.  No pertinent family history.  Social History:  has no history on file for tobacco use, alcohol use, and drug use.    Allergies: Allergies[1]  Medications: reviewed  No results found for this or any previous visit (from the past 48 hours).  No results found.  ROS 10 point review of systems is negative except as listed above in HPI.  There were no vitals taken for this visit.  Secondary Survey:  GCS: E(1)//V(3)//M(5) Constitutional: well-developed, well-nourished Skull: normocephalic, extensive degloving of scalp/forehead with secondary laceration between the eyes Eyes: pupils unable to evaluate due to swelling Face/ENT: midface stable without deformity, poor  dentition, external inspection of ears and nose normal, hearing unable to be assessed  Oropharynx: normal oropharyngeal mucosa, no blood Neck: no thyromegaly, trachea midline, c-collar in place on arrival, unable to assess midline cervical tenderness to palpation, no C-spine stepoffs Chest: breath sounds equal bilaterally, normal  respiratory effort, no midline or lateral chest wall tenderness to palpation/deformity Abdomen: soft, NT, scattered bruising, no hepatosplenomegaly FAST: not performed Pelvis: stable GU: normal female genitalia Back: no wounds, unable to assess T/L spine TTP, no T/L spine stepoffs Rectal: deferred Extremities: B Unna boots MSK: unable to assess gait/station, no clubbing/cyanosis of fingers/toes, normal ROM of all four  extremities Skin: warm, dry, no rashes  CXR in TB: cardiomegaly Pelvis XR in TB: unremarkable    Assessment/Plan: Plan Fall  Scalp/face laceration - to be repaired by EDP L orbital floor fx - ENT c/s, Dr. Elfrieda, ophtho c/s, Dr. Lavonia B nasal bone fx - ENT c/s, Dr. Elfrieda L maxillary sinus fx - ENT c/s, Dr. Elfrieda Unstable C4 fx associated with prior hardware - NSGY c/s, Dr. Joshua, MRI ordered, will also add MRA to eval vessels Suspected aspiration, LLL debris on CT - send resp cx  Shock - undifferentiated, continue epi gtt FEN - NPO except sips/chips DVT - SCDs, hold chemical ppx due to bleeding concerns Dispo - Admit to inpatient--ICU  Critical care time:  Family update: provided to son and daughter at bedside. They are clear on code status of DNR/DNI and MOST form available in merge chart. MOST form reflects no feeding tube. Okay to continue vasopressors and anti-arrhythmics prn.   Dreama GEANNIE Hanger, MD General and Trauma Surgery [facility]      [1] Not on File  "

## 2024-10-02 DIAGNOSIS — R9431 Abnormal electrocardiogram [ECG] [EKG]: Secondary | ICD-10-CM

## 2024-10-02 DIAGNOSIS — I472 Ventricular tachycardia, unspecified: Secondary | ICD-10-CM | POA: Diagnosis not present

## 2024-10-02 LAB — ABO/RH: ABO/RH(D): B POS

## 2024-10-02 MED ADMIN — Acetaminophen Suppos 650 MG: 650 mg | RECTAL | NDC 00713016506

## 2024-10-02 MED ADMIN — Tramadol HCl Tab 50 MG: 50 mg | NDC 60687079511

## 2024-10-02 MED ADMIN — Chlorhexidine Gluconate Pads 2%: 6 | TOPICAL | NDC 53462070523

## 2024-10-02 MED ADMIN — Amiodarone HCl in Dextrose 4.14% IV Soln 360 MG/200ML: 60 mg/h | INTRAVENOUS | NDC 43066036020

## 2024-10-02 MED ADMIN — Morphine Sulfate IV Soln PF 2 MG/ML: 2 mg | INTRAVENOUS | NDC 63323045200

## 2024-10-02 MED ADMIN — Acetaminophen Tab 500 MG: 1000 mg | NDC 50580045711

## 2024-10-02 MED ADMIN — Amiodarone HCl Inj 150 MG/3ML (50 MG/ML): 150 mg | INTRAVENOUS | NDC 43066036020

## 2024-10-02 MED ADMIN — Norepinephrine-Dextrose IV Solution 4 MG/250ML-5%: 2 ug/min | INTRAVENOUS | NDC 00338011220

## 2024-10-02 MED ADMIN — Oxycodone HCl Tab 5 MG: 5 mg | ORAL | NDC 68084035411

## 2024-10-02 MED ADMIN — Gadobutrol Inj 1 MMOL/ML (604.72 MG/ML): 10 mL | INTRAVENOUS | NDC 50419032512

## 2024-10-02 MED ADMIN — Methocarbamol Tab 500 MG: 1000 mg | NDC 70010075405

## 2024-10-02 MED ADMIN — Morphine Sulfate IV Soln PF 2 MG/ML: 2 mg | INTRAVENOUS | NDC 76045000401

## 2024-10-02 MED ADMIN — Lorazepam Inj 2 MG/ML: 1 mg | INTRAVENOUS | NDC 65219036801

## 2024-10-02 MED ADMIN — Sennosides Tab 8.6 MG: 8.6 mg | NDC 00904725261

## 2024-10-02 MED ADMIN — Amiodarone HCl in Dextrose 4.14% IV Soln 360 MG/200ML: 30 mg/h | INTRAVENOUS | NDC 43066036020

## 2024-10-02 MED ADMIN — Iohexol IV Soln 350 MG/ML: 75 mL | INTRAVENOUS | NDC 00407141490

## 2024-10-02 MED ADMIN — Acetaminophen Tab 500 MG: 1000 mg | ORAL | NDC 50580045711

## 2024-10-02 MED ADMIN — ORAL CARE MOUTH RINSE: 15 mL | OROMUCOSAL | NDC 99999080097

## 2024-10-02 MED ADMIN — Acetaminophen Suppos 650 MG: 325 mg | RECTAL | NDC 00713016506

## 2024-10-03 MED ADMIN — Acetaminophen Tab 500 MG: 1000 mg | NDC 50580045711

## 2024-10-03 MED ADMIN — Norepinephrine-Dextrose IV Solution 4 MG/250ML-5%: 5 ug/min | INTRAVENOUS | NDC 00338011220

## 2024-10-04 ENCOUNTER — Encounter: Payer: Self-pay | Admitting: Family Medicine

## 2024-10-10 DEATH — deceased

## 2024-10-28 ENCOUNTER — Ambulatory Visit: Admitting: Family Medicine
